# Patient Record
Sex: Female | Born: 1940 | Race: White | Hispanic: No | Marital: Married | Smoking: Former smoker
Health system: Southern US, Community
[De-identification: ages and names within clinical notes are randomized; demographics above are authoritative.]

## PROBLEM LIST (undated history)

## (undated) DIAGNOSIS — I10 Essential (primary) hypertension: Secondary | ICD-10-CM

## (undated) DIAGNOSIS — J84112 Idiopathic pulmonary fibrosis: Secondary | ICD-10-CM

## (undated) DIAGNOSIS — E871 Hypo-osmolality and hyponatremia: Secondary | ICD-10-CM

## (undated) DIAGNOSIS — E785 Hyperlipidemia, unspecified: Secondary | ICD-10-CM

## (undated) DIAGNOSIS — M1990 Unspecified osteoarthritis, unspecified site: Secondary | ICD-10-CM

## (undated) DIAGNOSIS — R011 Cardiac murmur, unspecified: Secondary | ICD-10-CM

## (undated) HISTORY — DX: Cardiac murmur, unspecified: R01.1

## (undated) HISTORY — DX: Hypo-osmolality and hyponatremia: E87.1

## (undated) HISTORY — DX: Unspecified osteoarthritis, unspecified site: M19.90

## (undated) HISTORY — DX: Hyperlipidemia, unspecified: E78.5

---

## 2011-08-10 DIAGNOSIS — E7800 Pure hypercholesterolemia, unspecified: Secondary | ICD-10-CM | POA: Insufficient documentation

## 2011-08-10 DIAGNOSIS — R0600 Dyspnea, unspecified: Secondary | ICD-10-CM | POA: Insufficient documentation

## 2014-07-18 DIAGNOSIS — I10 Essential (primary) hypertension: Secondary | ICD-10-CM | POA: Insufficient documentation

## 2014-07-18 DIAGNOSIS — I471 Supraventricular tachycardia: Secondary | ICD-10-CM | POA: Insufficient documentation

## 2014-07-18 DIAGNOSIS — I4719 Other supraventricular tachycardia: Secondary | ICD-10-CM | POA: Insufficient documentation

## 2014-07-18 DIAGNOSIS — N281 Cyst of kidney, acquired: Secondary | ICD-10-CM | POA: Insufficient documentation

## 2016-04-18 DIAGNOSIS — R7303 Prediabetes: Secondary | ICD-10-CM | POA: Insufficient documentation

## 2017-10-21 DIAGNOSIS — Z947 Corneal transplant status: Secondary | ICD-10-CM | POA: Insufficient documentation

## 2017-10-21 DIAGNOSIS — H18519 Endothelial corneal dystrophy, unspecified eye: Secondary | ICD-10-CM | POA: Insufficient documentation

## 2017-10-21 DIAGNOSIS — Z961 Presence of intraocular lens: Secondary | ICD-10-CM | POA: Insufficient documentation

## 2017-10-21 DIAGNOSIS — H353132 Nonexudative age-related macular degeneration, bilateral, intermediate dry stage: Secondary | ICD-10-CM | POA: Insufficient documentation

## 2017-10-21 DIAGNOSIS — H43812 Vitreous degeneration, left eye: Secondary | ICD-10-CM | POA: Insufficient documentation

## 2018-03-31 ENCOUNTER — Other Ambulatory Visit: Payer: Self-pay | Admitting: Family Medicine

## 2018-03-31 DIAGNOSIS — R9389 Abnormal findings on diagnostic imaging of other specified body structures: Secondary | ICD-10-CM

## 2018-04-05 ENCOUNTER — Other Ambulatory Visit: Payer: Self-pay

## 2018-04-11 ENCOUNTER — Ambulatory Visit
Admission: RE | Admit: 2018-04-11 | Discharge: 2018-04-11 | Disposition: A | Discharge status: Home / Self Care | Payer: Medicare Other | Source: Ambulatory Visit | Attending: Family Medicine | Admitting: Family Medicine

## 2018-04-11 ENCOUNTER — Other Ambulatory Visit: Payer: Self-pay

## 2018-04-11 DIAGNOSIS — R9389 Abnormal findings on diagnostic imaging of other specified body structures: Secondary | ICD-10-CM | POA: Diagnosis present

## 2019-02-26 DIAGNOSIS — J84112 Idiopathic pulmonary fibrosis: Secondary | ICD-10-CM | POA: Insufficient documentation

## 2019-09-04 DIAGNOSIS — R011 Cardiac murmur, unspecified: Secondary | ICD-10-CM | POA: Insufficient documentation

## 2020-05-23 ENCOUNTER — Other Ambulatory Visit: Payer: Self-pay

## 2020-05-23 DIAGNOSIS — Z87891 Personal history of nicotine dependence: Secondary | ICD-10-CM | POA: Insufficient documentation

## 2020-05-23 DIAGNOSIS — I10 Essential (primary) hypertension: Secondary | ICD-10-CM | POA: Insufficient documentation

## 2020-05-23 DIAGNOSIS — H1132 Conjunctival hemorrhage, left eye: Secondary | ICD-10-CM | POA: Insufficient documentation

## 2020-05-23 DIAGNOSIS — H5712 Ocular pain, left eye: Secondary | ICD-10-CM | POA: Diagnosis present

## 2020-05-23 NOTE — ED Triage Notes (Signed)
Pt states she her eye started to hurt x1hour, she looked in the mirror "and that's what it was." Frank redness and swelling of the conjunctiva noted. Pt denies any visual disturbance. PERRLA noted. Pupils 2+ bilaterally. Pt denies known injury

## 2020-05-24 ENCOUNTER — Emergency Department
Admission: EM | Admit: 2020-05-24 | Discharge: 2020-05-24 | Disposition: A | Discharge status: Home / Self Care | Payer: Medicare Other | Attending: Emergency Medicine | Admitting: Emergency Medicine

## 2020-05-24 DIAGNOSIS — H1132 Conjunctival hemorrhage, left eye: Secondary | ICD-10-CM

## 2020-05-24 HISTORY — DX: Essential (primary) hypertension: I10

## 2020-05-24 MED ORDER — ARTIFICIAL TEARS OPHTHALMIC OINT
1.0000 "application " | TOPICAL_OINTMENT | OPHTHALMIC | Status: DC | PRN
  Administered 2020-05-24: 1 via OPHTHALMIC
  Filled 2020-05-24 (×2): qty 1

## 2020-05-24 NOTE — ED Provider Notes (Signed)
[facility] Emergency Department Provider Note  ____________________________________________  Time seen: Approximately 4:14 AM  I have reviewed the triage vital signs and the nursing notes.   HISTORY  Chief Complaint Eye Injury   HPI Susan Sosa is a 80 y.o. female with a history of hypertension, corneal transplant, cataract surgery who presents for evaluation of left eye swelling and pain.  Patient reports that she was watching TV when she started having mild pain to the left eye.  She looked in the mirror and saw swelling and redness of the eye which prompted visit to the emergency room.  She denies blurry vision or double vision.  She denies headache.  She denies trauma.  She reports that her pain is minimal.  She takes aspirin but no other blood thinners.    Past Medical History:  Diagnosis Date  . Hypertension   Cataract surgery Corneal transplant  Allergies Patient has no allergy information on record.  History reviewed. No pertinent family history.  Social History Social History   Tobacco Use  . Smoking status: Former Games developer  . Smokeless tobacco: Never Used    Review of Systems  Constitutional: Negative for fever. Eyes: Negative for visual changes. + L eye pain and swelling ENT: Negative for sore throat. Neck: No neck pain  Cardiovascular: Negative for chest pain. Respiratory: Negative for shortness of breath. Gastrointestinal: Negative for abdominal pain, vomiting or diarrhea. Genitourinary: Negative for dysuria. Musculoskeletal: Negative for back pain. Skin: Negative for rash. Neurological: Negative for headaches, weakness or numbness. Psych: No SI or HI  ____________________________________________   PHYSICAL EXAM:  VITAL SIGNS: ED Triage Vitals  Enc Vitals Group     BP 05/23/20 2137 (!) 194/79     Pulse Rate 05/23/20 2137 75     Resp 05/23/20 2137 18     Temp 05/23/20 2137 98.4 F (36.9 C)     Temp Source 05/23/20  2137 Oral     SpO2 05/23/20 2137 98 %     Weight 05/23/20 2138 136 lb (61.7 kg)     Height 05/23/20 2138 4\' 11"  (1.499 m)     Head Circumference --      Peak Flow --      Pain Score 05/23/20 2138 0     Pain Loc --      Pain Edu? --      Excl. in GC? --     Constitutional: Alert and oriented. Well appearing and in no apparent distress. HEENT:      Head: Normocephalic and atraumatic.         Eyes: Pretty severe subconjunctival hemorrhage with chemosis, pupil is reactive, normal consensual reflex, visual fields are intact, extraocular movements are intact, intraocular pressure of 19, visual acuity of 20/40 on bilateral eyes with reading glasses.      Mouth/Throat: Mucous membranes are moist.       Neck: Supple with no signs of meningismus. Cardiovascular: Regular rate and rhythm. Respiratory: Normal respiratory effort.  Musculoskeletal:  No edema, cyanosis, or erythema of extremities. Neurologic: Normal speech and language. Face is symmetric. Moving all extremities. No gross focal neurologic deficits are appreciated. Skin: Skin is warm, dry and intact. No rash noted. Psychiatric: Mood and affect are normal. Speech and behavior are normal.  ____________________________________________   LABS (all labs ordered are listed, but only abnormal results are displayed)  Labs Reviewed - No data to display ____________________________________________  EKG  none  ____________________________________________  RADIOLOGY  none  ____________________________________________  PROCEDURES  Procedure(s) performed: None Procedures Critical Care performed:  None ____________________________________________   INITIAL IMPRESSION / ASSESSMENT AND PLAN / ED COURSE  80 y.o. female with a history of hypertension, corneal transplant, cataract surgery who presents for evaluation of left eye swelling and pain.  Exam as listed above.  Discussed with Dr. Druscilla Brownie the results of my exam and send him  a picture through MyChart of patient's eye.  He agrees with the diagnosis subconjunctival hemorrhage.  Recommend artificial tears every hour while patient is awake and he will see her in the office at 10 AM.  Patient was given artificial tears here.  Discussed his recommendations.  He recommended more emergent evaluation in the ED if she has severe pain or if she develops blurry/double vision.  Discussed these recommendations with patient and her husband who was at bedside.       _____________________________________________ Please note:  Patient was evaluated in Emergency Department today for the symptoms described in the history of present illness. Patient was evaluated in the context of the global COVID-19 pandemic, which necessitated consideration that the patient might be at risk for infection with the SARS-CoV-2 virus that causes COVID-19. Institutional protocols and algorithms that pertain to the evaluation of patients at risk for COVID-19 are in a state of rapid change based on information released by regulatory bodies including the CDC and federal and state organizations. These policies and algorithms were followed during the patient's care in the ED.  Some ED evaluations and interventions may be delayed as a result of limited staffing during the pandemic.   [place] Controlled Substance Database was reviewed by me. ____________________________________________   FINAL CLINICAL IMPRESSION(S) / ED DIAGNOSES   Final diagnoses:  Subconjunctival hemorrhage of left eye      NEW MEDICATIONS STARTED DURING THIS VISIT:  ED Discharge Orders    None       Note:  This document was prepared using Dragon voice recognition software and may include unintentional dictation errors.    Nita Sickle, MD 05/24/20 [id number]

## 2020-05-24 NOTE — Discharge Instructions (Signed)
Apply the ointment given to you every hour to the left eye while you are awake.  At 10 AM [facility] to be seen by Dr. Druscilla Brownie.  The front office door will be closed.  Make sure to go to the right-hand side door in the building.  The door will be open and he will be waiting for you.  If you are unable to find the location or to be seen by him please make sure to return to the ER so he can see you here.  If you have changes in vision please return to the ER for more emergent evaluation.

## 2020-08-04 ENCOUNTER — Other Ambulatory Visit: Payer: Self-pay

## 2020-08-04 ENCOUNTER — Encounter: Payer: Medicare Other | Attending: Pulmonary Disease | Admitting: *Deleted

## 2020-08-04 DIAGNOSIS — M5137 Other intervertebral disc degeneration, lumbosacral region: Secondary | ICD-10-CM | POA: Insufficient documentation

## 2020-08-04 DIAGNOSIS — M1710 Unilateral primary osteoarthritis, unspecified knee: Secondary | ICD-10-CM | POA: Insufficient documentation

## 2020-08-04 DIAGNOSIS — M75120 Complete rotator cuff tear or rupture of unspecified shoulder, not specified as traumatic: Secondary | ICD-10-CM | POA: Insufficient documentation

## 2020-08-04 DIAGNOSIS — J8410 Pulmonary fibrosis, unspecified: Secondary | ICD-10-CM | POA: Insufficient documentation

## 2020-08-04 DIAGNOSIS — M179 Osteoarthritis of knee, unspecified: Secondary | ICD-10-CM | POA: Insufficient documentation

## 2020-08-04 NOTE — Progress Notes (Signed)
Initial telephone orientation completed. [facility]angosis can be found in [facility] 6/24. EP orientation scheduled for Tuesday 7/19 at 10am.

## 2020-08-12 ENCOUNTER — Other Ambulatory Visit: Payer: Self-pay

## 2020-08-12 VITALS — Ht 60.0 in | Wt 145.7 lb

## 2020-08-12 DIAGNOSIS — J8410 Pulmonary fibrosis, unspecified: Secondary | ICD-10-CM

## 2020-08-12 NOTE — Progress Notes (Signed)
Pulmonary Individual Treatment Plan  Patient Details  Name: Susan Sosa MRN: [id number] Date of Birth: 1940-08-19 Referring Provider:   Flowsheet Row Pulmonary Rehab from 08/12/2020 in [facility] Cardiac and Pulmonary Rehab  Referring Provider Dr. Vida Rigger MD       Initial Encounter Date:  Flowsheet Row Pulmonary Rehab from 08/12/2020 in [facility] Cardiac and Pulmonary Rehab  Date 08/12/20       Visit Diagnosis: Pulmonary fibrosis (HCC)  Patient's Home Medications on Admission:  Current Outpatient Medications:    albuterol (PROVENTIL) (2.5 MG/3ML) 0.083% nebulizer solution, Inhale into the lungs., Disp: , Rfl:    ascorbic acid (VITAMIN C) 500 MG tablet, ascorbic acid (vitamin C) 500 mg tablet   1 by oral route., Disp: , Rfl:    aspirin 81 MG chewable tablet, aspirin 81 mg chewable tablet   81 mg by oral route., Disp: , Rfl:    atorvastatin (LIPITOR) 40 MG tablet, Take 1 tablet by mouth daily., Disp: , Rfl:    calcium-vitamin D (OSCAL WITH D) 500-200 MG-UNIT TABS tablet, Take 1 tablet by mouth daily., Disp: , Rfl:    cyanocobalamin 1000 MCG tablet, cyanocobalamin (vit B-12) 1,000 mcg tablet   1000 ugs by oral route., Disp: , Rfl:    esomeprazole (NEXIUM) 40 MG capsule, Take 1 capsule by mouth at bedtime., Disp: , Rfl:    hydrALAZINE (APRESOLINE) 10 MG tablet, TAKE 1 TABLET BY MOUTH NIGHTLY AS NEEDED, Disp: , Rfl:    levalbuterol (XOPENEX) 1.25 MG/3ML nebulizer solution, Inhale into the lungs., Disp: , Rfl:    losartan (COZAAR) 50 MG tablet, 1 tab by mouth daily around 5pm, Disp: , Rfl:    magnesium oxide (MAG-OX) 400 MG tablet, magnesium oxide 400 mg (241.3 mg magnesium) tablet   400 mg twice a day by oral route., Disp: , Rfl:    multivitamin-iron-minerals-folic acid (CENTRUM) chewable tablet, Chew by mouth., Disp: , Rfl:    naproxen sodium (ALEVE) 220 MG tablet, naproxen sodium 220 mg tablet   440 mg twice a day by oral route., Disp: , Rfl:    Pirfenidone (ESBRIET) 267 MG CAPS, Take by  mouth., Disp: , Rfl:    prednisoLONE acetate (PRED FORTE) 1 % ophthalmic suspension, prednisolone acetate 1 % eye drops,suspension  INSTILL 1 DROP INTO LEFT EYE 3 TIMES A DAY, Disp: , Rfl:    triamcinolone cream (KENALOG) 0.1 %, APPLY TO AFFECTED AREA TWICE A DAY, Disp: , Rfl:    triamterene-hydrochlorothiazide (MAXZIDE) 75-50 MG tablet, TAKE 1 BY MOUTH EVERY DAY., Disp: , Rfl:    verapamil (CALAN-SR) 180 MG CR tablet, Take by mouth., Disp: , Rfl:    vitamin E 180 MG (400 UNITS) capsule, vitamin E 268 mg (400 unit) capsule   400 units by oral route., Disp: , Rfl:   Past Medical History: Past Medical History:  Diagnosis Date   Hypertension     Tobacco Use: Social History   Tobacco Use  Smoking Status Former  Smokeless Tobacco Never    Labs: Recent Review Flowsheet Data   There is no flowsheet data to display.      Pulmonary Assessment Scores:  Pulmonary Assessment Scores     Row Name 08/12/20 1440         ADL UCSD   ADL Phase Entry     SOB Score total 23     Rest 0     Walk 1     Stairs 3     Bath 1  Dress 1     Shop 1           CAT Score     CAT Score 11           mMRC Score     mMRC Score 1             UCSD: Self-administered rating of dyspnea associated with activities of daily living (ADLs) 6-point scale (0 = "not at all" to 5 = "maximal or unable to do because of breathlessness")  Scoring Scores range from 0 to 120.  Minimally important difference is 5 units  CAT: CAT can identify the health impairment of COPD patients and is better correlated with disease progression.  CAT has a scoring range of zero to 40. The CAT score is classified into four groups of low (less than 10), medium (10 - 20), high (21-30) and very high (31-40) based on the impact level of disease on health status. A CAT score over 10 suggests significant symptoms.  A worsening CAT score could be explained by an exacerbation, poor medication adherence, poor inhaler technique, or  progression of COPD or comorbid conditions.  CAT MCID is 2 points  mMRC: mMRC (Modified Medical Research Council) Dyspnea Scale is used to assess the degree of baseline functional disability in patients of respiratory disease due to dyspnea. No minimal important difference is established. A decrease in score of 1 point or greater is considered a positive change.   Pulmonary Function Assessment:   Exercise Target Goals: Exercise Program Goal: Individual exercise prescription set using results from initial 6 min walk test and THRR while considering  patient's activity barriers and safety.   Exercise Prescription Goal: Initial exercise prescription builds to 30-45 minutes a day of aerobic activity, 2-3 days per week.  Home exercise guidelines will be given to patient during program as part of exercise prescription that the participant will acknowledge.  Education: Aerobic Exercise: - Group verbal and visual presentation on the components of exercise prescription. Introduces F.I.T.T principle from ACSM for exercise prescriptions.  Reviews F.I.T.T. principles of aerobic exercise including progression. Written material given at graduation.   Education: Resistance Exercise: - Group verbal and visual presentation on the components of exercise prescription. Introduces F.I.T.T principle from ACSM for exercise prescriptions  Reviews F.I.T.T. principles of resistance exercise including progression. Written material given at graduation.    Education: Exercise & Equipment Safety: - Individual verbal instruction and demonstration of equipment use and safety with use of the equipment. Flowsheet Row Pulmonary Rehab from 08/12/2020 in [facility] Cardiac and Pulmonary Rehab  Education need identified 08/12/20  Date 08/12/20  Educator KL  Instruction Review Code 1- Verbalizes Understanding       Education: Exercise Physiology & General Exercise Guidelines: - Group verbal and written instruction with models  to review the exercise physiology of the cardiovascular system and associated critical values. Provides general exercise guidelines with specific guidelines to those with heart or lung disease.    Education: Flexibility, Balance, Mind/Body Relaxation: - Group verbal and visual presentation with interactive activity on the components of exercise prescription. Introduces F.I.T.T principle from ACSM for exercise prescriptions. Reviews F.I.T.T. principles of flexibility and balance exercise training including progression. Also discusses the mind body connection.  Reviews various relaxation techniques to help reduce and manage stress (i.e. Deep breathing, progressive muscle relaxation, and visualization). Balance handout provided to take home. Written material given at graduation.   Activity Barriers & Risk Stratification:  Activity Barriers & Cardiac Risk Stratification -  08/12/20 1158       Activity Barriers & Cardiac Risk Stratification   Activity Barriers Shortness of Breath;Joint Problems;Deconditioning;Back Problems;Muscular Weakness;Other (comment)    Comments Right knee- gets cortisone shots             6 Minute Walk:  6 Minute Walk     Row Name 08/12/20 1158         6 Minute Walk   Phase Initial     Distance 500 feet     Walk Time 3.5 minutes  Stopped at 3:30 due to O2 sats     # of Rest Breaks 0     MPH 1.62     METS 1.08     RPE 12     Perceived Dyspnea  2     VO2 Peak 3.78     Symptoms No     Resting HR 67 bpm     Resting BP 130/62     Resting Oxygen Saturation  97 %     Exercise Oxygen Saturation  during 6 min walk 79 %     Max Ex. HR 111 bpm     Max Ex. BP 162/68     2 Minute Post BP 134/68           Interval HR     1 Minute HR 87     2 Minute HR 101     3 Minute HR 111  Stopped at 3:30 due to O2 < 80%     Interval Heart Rate? Yes           Interval Oxygen     Interval Oxygen? Yes     Baseline Oxygen Saturation % 97 %     1 Minute Oxygen Saturation %  93 %     1 Minute Liters of Oxygen 2 L     2 Minute Oxygen Saturation % 87 %     2 Minute Liters of Oxygen 2 L     3 Minute Oxygen Saturation % 79 %     3 Minute Liters of Oxygen 2 L     4 Minute Oxygen Saturation % --  stopped     5 Minute Oxygen Saturation % --  stopped     6 Minute Oxygen Saturation % --  stopped     2 Minute Post Oxygen Saturation % 97 %     2 Minute Post Liters of Oxygen 3 L            Oxygen Initial Assessment:  Oxygen Initial Assessment - 08/12/20 1438       Home Oxygen   Home Oxygen Device Home Concentrator;Portable Concentrator    Sleep Oxygen Prescription Continuous    Liters per minute 2    Home Exercise Oxygen Prescription Continuous    Liters per minute 2    Home Resting Oxygen Prescription Continuous   Portable- pulsed   Liters per minute 2    Compliance with Home Oxygen Use Yes      Initial 6 min Walk   Oxygen Used Continuous    Liters per minute 2      Program Oxygen Prescription   Program Oxygen Prescription Continuous    Liters per minute 2      Intervention   Short Term Goals To learn and exhibit compliance with exercise, home and travel O2 prescription;To learn and understand importance of monitoring SPO2 with pulse oximeter and demonstrate accurate use of the pulse oximeter.;To learn and understand  importance of maintaining oxygen saturations>88%;To learn and demonstrate proper pursed lip breathing techniques or other breathing techniques. ;To learn and demonstrate proper use of respiratory medications    Long  Term Goals Exhibits compliance with exercise, home  and travel O2 prescription;Verbalizes importance of monitoring SPO2 with pulse oximeter and return demonstration;Maintenance of O2 saturations>88%;Exhibits proper breathing techniques, such as pursed lip breathing or other method taught during program session;Compliance with respiratory medication;Demonstrates proper use of MDI's             Oxygen  Re-Evaluation:   Oxygen Discharge (Final Oxygen Re-Evaluation):   Initial Exercise Prescription:  Initial Exercise Prescription - 08/12/20 1500       Date of Initial Exercise RX and Referring Provider   Date 08/12/20    Referring Provider Dr. Karna Christmas, Luis Abed MD      Treadmill   MPH 1.5    Grade 0    Minutes 15    METs 2.15      Recumbant Bike   Level 1    RPM 60    Watts 10    Minutes 15    METs 1.08      NuStep   Level 1    SPM 80    Minutes 15    METs 1.08      T5 Nustep   Level 1    SPM 80    Minutes 15    METs 1.08      Prescription Details   Frequency (times per week) 2    Duration Progress to 30 minutes of continuous aerobic without signs/symptoms of physical distress      Intensity   THRR 40-80% of Max Heartrate 95-124    Ratings of Perceived Exertion 11-13    Perceived Dyspnea 0-4      Progression   Progression Continue to progress workloads to maintain intensity without signs/symptoms of physical distress.      Resistance Training   Training Prescription Yes    Weight 3 lb    Reps 10-15             Perform Capillary Blood Glucose checks as needed.  Exercise Prescription Changes:   Exercise Prescription Changes     Row Name 08/12/20 1500             Response to Exercise   Blood Pressure (Admit) 130/62       Blood Pressure (Exercise) 162/68       Blood Pressure (Exit) 134/66       Heart Rate (Admit) 67 bpm       Heart Rate (Exercise) 111 bpm       Heart Rate (Exit) 72 bpm       Oxygen Saturation (Admit) 97 %       Oxygen Saturation (Exercise) 79 %       Oxygen Saturation (Exit) 97 %       Rating of Perceived Exertion (Exercise) 12       Perceived Dyspnea (Exercise) 2       Symptoms none       Comments walk test results                Exercise Comments:   Exercise Goals and Review:   Exercise Goals     Row Name 08/12/20 1509             Exercise Goals   Increase Physical Activity Yes       Intervention  Provide advice, education, support and counseling  about physical activity/exercise needs.;Develop an individualized exercise prescription for aerobic and resistive training based on initial evaluation findings, risk stratification, comorbidities and participant's personal goals.       Expected Outcomes Long Term: Add in home exercise to make exercise part of routine and to increase amount of physical activity.;Short Term: Attend rehab on a regular basis to increase amount of physical activity.;Long Term: Exercising regularly at least 3-5 days a week.       Increase Strength and Stamina Yes       Intervention Provide advice, education, support and counseling about physical activity/exercise needs.;Develop an individualized exercise prescription for aerobic and resistive training based on initial evaluation findings, risk stratification, comorbidities and participant's personal goals.       Expected Outcomes Short Term: Increase workloads from initial exercise prescription for resistance, speed, and METs.;Short Term: Perform resistance training exercises routinely during rehab and add in resistance training at home;Long Term: Improve cardiorespiratory fitness, muscular endurance and strength as measured by increased METs and functional capacity ( )       Able to understand and use rate of perceived exertion (RPE) scale Yes       Intervention Provide education and explanation on how to use RPE scale       Expected Outcomes Short Term: Able to use RPE daily in rehab to express subjective intensity level;Long Term:  Able to use RPE to guide intensity level when exercising independently       Able to understand and use Dyspnea scale Yes       Intervention Provide education and explanation on how to use Dyspnea scale       Expected Outcomes Short Term: Able to use Dyspnea scale daily in rehab to express subjective sense of shortness of breath during exertion;Long Term: Able to use Dyspnea scale to guide  intensity level when exercising independently       Knowledge and understanding of Target Heart Rate Range (THRR) Yes       Intervention Provide education and explanation of THRR including how the numbers were predicted and where they are located for reference       Expected Outcomes Short Term: Able to state/look up THRR;Long Term: Able to use THRR to govern intensity when exercising independently;Short Term: Able to use daily as guideline for intensity in rehab       Able to check pulse independently Yes       Intervention Provide education and demonstration on how to check pulse in carotid and radial arteries.;Review the importance of being able to check your own pulse for safety during independent exercise       Expected Outcomes Short Term: Able to explain why pulse checking is important during independent exercise;Long Term: Able to check pulse independently and accurately       Understanding of Exercise Prescription Yes       Intervention Provide education, explanation, and written materials on patient's individual exercise prescription       Expected Outcomes Short Term: Able to explain program exercise prescription;Long Term: Able to explain home exercise prescription to exercise independently                Exercise Goals Re-Evaluation :   Discharge Exercise Prescription (Final Exercise Prescription Changes):  Exercise Prescription Changes - 08/12/20 1500       Response to Exercise   Blood Pressure (Admit) 130/62    Blood Pressure (Exercise) 162/68    Blood Pressure (Exit) 134/66    Heart Rate (Admit)  67 bpm    Heart Rate (Exercise) 111 bpm    Heart Rate (Exit) 72 bpm    Oxygen Saturation (Admit) 97 %    Oxygen Saturation (Exercise) 79 %    Oxygen Saturation (Exit) 97 %    Rating of Perceived Exertion (Exercise) 12    Perceived Dyspnea (Exercise) 2    Symptoms none    Comments walk test results             Nutrition:  Target Goals: Understanding of nutrition  guidelines, daily intake of sodium 1500mg , cholesterol 200mg , calories 30% from fat and 7% or less from saturated fats, daily to have 5 or more servings of fruits and vegetables.  Education: All About Nutrition: -Group instruction provided by verbal, written material, interactive activities, discussions, models, and posters to present general guidelines for heart healthy nutrition including fat, fiber, MyPlate, the role of sodium in heart healthy nutrition, utilization of the nutrition label, and utilization of this knowledge for meal planning. Follow up email sent as well. Written material given at graduation.   Biometrics:  Pre Biometrics - 08/12/20 1157       Pre Biometrics   Height 5' (1.524 m)    Weight 145 lb 11.2 oz (66.1 kg)    BMI (Calculated) 28.46    Single Leg Stand 10.03 seconds              Nutrition Therapy Plan and Nutrition Goals:   Nutrition Assessments:  MEDIFICTS Score Key: ?70 Need to make dietary changes  40-70 Heart Healthy Diet ? 40 Therapeutic Level Cholesterol Diet  Flowsheet Row Pulmonary Rehab from 08/12/2020 in [facility] Cardiac and Pulmonary Rehab  Picture Your Plate Total Score on Admission 66      Picture Your Plate Scores: <46 Unhealthy dietary pattern with much room for improvement. 41-50 Dietary pattern unlikely to meet recommendations for good health and room for improvement. 51-60 More healthful dietary pattern, with some room for improvement.  >60 Healthy dietary pattern, although there may be some specific behaviors that could be improved.   Nutrition Goals Re-Evaluation:   Nutrition Goals Discharge (Final Nutrition Goals Re-Evaluation):   Psychosocial: Target Goals: Acknowledge presence or absence of significant depression and/or stress, maximize coping skills, provide positive support system. Participant is able to verbalize types and ability to use techniques and skills needed for reducing stress and depression.   Education:  Stress, Anxiety, and Depression - Group verbal and visual presentation to define topics covered.  Reviews how body is impacted by stress, anxiety, and depression.  Also discusses healthy ways to reduce stress and to treat/manage anxiety and depression.  Written material given at graduation.   Education: Sleep Hygiene -Provides group verbal and written instruction about how sleep can affect your health.  Define sleep hygiene, discuss sleep cycles and impact of sleep habits. Review good sleep hygiene tips.    Initial Review & Psychosocial Screening:  Initial Psych Review & Screening - 08/04/20 1545       Initial Review   Current issues with Current Sleep Concerns      Family Dynamics   Good Support System? Yes   husband     Barriers   Psychosocial barriers to participate in program There are no identifiable barriers or psychosocial needs.;The patient should benefit from training in stress management and relaxation.      Screening Interventions   Interventions Encouraged to exercise;Provide feedback about the scores to participant;To provide support and resources with identified psychosocial needs  Expected Outcomes Short Term goal: Utilizing psychosocial counselor, staff and physician to assist with identification of specific Stressors or current issues interfering with healing process. Setting desired goal for each stressor or current issue identified.;Long Term Goal: Stressors or current issues are controlled or eliminated.;Short Term goal: Identification and review with participant of any Quality of Life or Depression concerns found by scoring the questionnaire.;Long Term goal: The participant improves quality of Life and PHQ9 Scores as seen by post scores and/or verbalization of changes             Quality of Life Scores:  Scores of 19 and below usually indicate a poorer quality of life in these areas.  A difference of  2-3 points is a clinically meaningful difference.  A  difference of 2-3 points in the total score of the Quality of Life Index has been associated with significant improvement in overall quality of life, self-image, physical symptoms, and general health in studies assessing change in quality of life.  PHQ-9: Recent Review Flowsheet Data     Depression screen [facility] 2/9 08/12/2020   Decreased Interest 0   Down, Depressed, Hopeless 0   PHQ - 2 Score 0   Altered sleeping 0   Tired, decreased energy 1   Change in appetite 0   Feeling bad or failure about yourself  0   Trouble concentrating 0   Moving slowly or fidgety/restless 0   Suicidal thoughts 0   PHQ-9 Score 1   Difficult doing work/chores Not difficult at all      Interpretation of Total Score  Total Score Depression Severity:  1-4 = Minimal depression, 5-9 = Mild depression, 10-14 = Moderate depression, 15-19 = Moderately severe depression, 20-27 = Severe depression   Psychosocial Evaluation and Intervention:  Psychosocial Evaluation - 08/04/20 1546       Psychosocial Evaluation & Interventions   Interventions Encouraged to exercise with the program and follow exercise prescription    Comments Susan 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 She has been dealing with this diagnosis for the past two years, but is proud that the doctor said she seems to be holding her own. She reports that she already has lost 30% of her lung capacity and is working hard to maintain her current lung function which is why she is motivated to come to rehab. She walks [facility]-3 times a [facility] and is diligent about her breathing exercises and medications. She wears oxygen as well. Her husband is her main support system. She does not report any stressors currently as she feels like she is handling her current health status appropriately. She has noticed in the last 6 months that her sleep has been altered, where she used to fall asleep quickly now it is taking her over an hour to fall  back asleep after waking to use the bathroom.    Expected Outcomes Short: attend pulmonary rehab for education and exercise. Long: develop and maintain positive self care habits.    Continue Psychosocial Services  Follow up required by staff             Psychosocial Re-Evaluation:   Psychosocial Discharge (Final Psychosocial Re-Evaluation):   Education: Education Goals: Education classes will be provided on a weekly basis, covering required topics. Participant will state understanding/return demonstration of topics presented.  Learning Barriers/Preferences:   General Pulmonary Education Topics:  Infection Prevention: - Provides verbal and written material to individual with discussion of infection control including  proper hand washing and proper equipment cleaning during exercise session. Flowsheet Row Pulmonary Rehab from 08/12/2020 in [facility] Cardiac and Pulmonary Rehab  Education need identified 08/12/20  Date 08/12/20  Educator KL  Instruction Review Code 1- Verbalizes Understanding       Falls Prevention: - Provides verbal and written material to individual with discussion of falls prevention and safety. Flowsheet Row Pulmonary Rehab from 08/12/2020 in [facility] Cardiac and Pulmonary Rehab  Education need identified 08/12/20  Date 08/12/20  Educator KL  Instruction Review Code 1- Verbalizes Understanding       Chronic Lung Disease Review: - Group verbal instruction with posters, models, PowerPoint presentations and videos,  to review new updates, new respiratory medications, new advancements in procedures and treatments. Providing information on websites and "800" numbers for continued self-education. Includes information about supplement oxygen, available portable oxygen systems, continuous and intermittent flow rates, oxygen safety, concentrators, and Medicare reimbursement for oxygen. Explanation of Pulmonary Drugs, including class, frequency, complications, importance of  spacers, rinsing mouth after steroid MDI's, and proper cleaning methods for nebulizers. Review of basic lung anatomy and physiology related to function, structure, and complications of lung disease. Review of risk factors. Discussion about methods for diagnosing sleep apnea and types of masks and machines for OSA. Includes a review of the use of types of environmental controls: home humidity, furnaces, filters, dust mite/pet prevention, HEPA vacuums. Discussion about weather changes, air quality and the benefits of nasal washing. Instruction on Warning signs, infection symptoms, calling MD promptly, preventive modes, and value of vaccinations. Review of effective airway clearance, coughing and/or vibration techniques. Emphasizing that all should Create an Action Plan. Written material given at graduation.   AED/CPR: - Group verbal and written instruction with the use of models to demonstrate the basic use of the AED with the basic ABC's of resuscitation.    Anatomy and Cardiac Procedures: - Group verbal and visual presentation and models provide information about basic cardiac anatomy and function. Reviews the testing methods done to diagnose heart disease and the outcomes of the test results. Describes the treatment choices: Medical Management, Angioplasty, or Coronary Bypass Surgery for treating various heart conditions including Myocardial Infarction, Angina, Valve Disease, and Cardiac Arrhythmias.  Written material given at graduation.   Medication Safety: - Group verbal and visual instruction to review commonly prescribed medications for heart and lung disease. Reviews the medication, class of the drug, and side effects. Includes the steps to properly store meds and maintain the prescription regimen.  Written material given at graduation.   Other: -Provides group and verbal instruction on various topics (see comments)   Knowledge Questionnaire Score:  Knowledge Questionnaire Score -  08/12/20 1443       Knowledge Questionnaire Score   Pre Score 15/18: Oxygen saturation, Albuterol, Oxygen flame              Core Components/Risk Factors/Patient Goals at Admission:  Personal Goals and Risk Factors at Admission - 08/12/20 1508       Core Components/Risk Factors/Patient Goals on Admission    Weight Management Yes;Weight Maintenance    Intervention Weight Management: Develop a combined nutrition and exercise program designed to reach desired caloric intake, while maintaining appropriate intake of nutrient and fiber, sodium and fats, and appropriate energy expenditure required for the weight goal.;Weight Management: Provide education and appropriate resources to help participant work on and attain dietary goals.;Weight Management/Obesity: Establish reasonable short term and long term weight goals.    Admit Weight 145 lb (65.8 kg)  Goal Weight: Short Term 145 lb (65.8 kg)    Goal Weight: Long Term 145 lb (65.8 kg)    Expected Outcomes Short Term: Continue to assess and modify interventions until short term weight is achieved;Weight Maintenance: Understanding of the daily nutrition guidelines, which includes 25-35% calories from fat, 7% or less cal from saturated fats, less than 200mg  cholesterol, less than 1.5gm of sodium, & 5 or more servings of fruits and vegetables daily;Understanding recommendations for meals to include 15-35% energy as protein, 25-35% energy from fat, 35-60% energy from carbohydrates, less than 200mg  of dietary cholesterol, 20-35 gm of total fiber daily;Understanding of distribution of calorie intake throughout the day with the consumption of 4-5 meals/snacks    Improve shortness of breath with ADL's Yes    Intervention Provide education, individualized exercise plan and daily activity instruction to help decrease symptoms of SOB with activities of daily living.    Expected Outcomes Short Term: Improve cardiorespiratory fitness to achieve a reduction of  symptoms when performing ADLs;Long Term: Be able to perform more ADLs without symptoms or delay the onset of symptoms    Hypertension Yes    Intervention Provide education on lifestyle modifcations including regular physical activity/exercise, weight management, moderate sodium restriction and increased consumption of fresh fruit, vegetables, and low fat dairy, alcohol moderation, and smoking cessation.;Monitor prescription use compliance.    Expected Outcomes Short Term: Continued assessment and intervention until BP is < 140/5390mm HG in hypertensive participants. < 130/7180mm HG in hypertensive participants with diabetes, heart failure or chronic kidney disease.;Long Term: Maintenance of blood pressure at goal levels.    Lipids Yes    Intervention Provide education and support for participant on nutrition & aerobic/resistive exercise along with prescribed medications to achieve LDL 70mg , HDL >40mg .    Expected Outcomes Short Term: Participant states understanding of desired cholesterol values and is compliant with medications prescribed. Participant is following exercise prescription and nutrition guidelines.;Long Term: Cholesterol controlled with medications as prescribed, with individualized exercise RX and with personalized nutrition plan. Value goals: LDL < 70mg , HDL > 40 mg.             Education:Diabetes - Individual verbal and written instruction to review signs/symptoms of diabetes, desired ranges of glucose level fasting, after meals and with exercise. Acknowledge that pre and post exercise glucose checks will be done for 3 sessions at entry of program.   Know Your Numbers and Heart Failure: - Group verbal and visual instruction to discuss disease risk factors for cardiac and pulmonary disease and treatment options.  Reviews associated critical values for Overweight/Obesity, Hypertension, Cholesterol, and Diabetes.  Discusses basics of heart failure: signs/symptoms and treatments.   Introduces Heart Failure Zone chart for action plan for heart failure.  Written material given at graduation.   Core Components/Risk Factors/Patient Goals Review:    Core Components/Risk Factors/Patient Goals at Discharge (Final Review):    ITP Comments:  ITP Comments     Row Name 08/04/20 1557 08/12/20 1141         ITP Comments Initial telephone orientation completed. [facility] EP orientation scheduled for Tuesday 7/19 at 10am. Completed 6MWT and gym orientation. Initial ITP created and sent for review to Dr. Vida RiggerFuad Aleskerov, Medical Director.               Comments: Initial ITP

## 2020-08-12 NOTE — Patient Instructions (Addendum)
Patient Instructions  Patient Details  Name: Susan Sosa MRN: [id number] Date of Birth: 1941-01-05 Referring Provider:  Vida Rigger, MD  Below are your personal goals for exercise, nutrition, and risk factors. Our goal is to help you stay on track towards obtaining and maintaining these goals. We will be discussing your progress on these goals with you throughout the program.  Initial Exercise Prescription:  Initial Exercise Prescription - 08/12/20 1500       Date of Initial Exercise RX and Referring Provider   Date 08/12/20    Referring Provider Dr. Karna Christmas, Luis Abed MD      Treadmill   MPH 1.5    Grade 0    Minutes 15    METs 2.15      Recumbant Bike   Level 1    RPM 60    Watts 10    Minutes 15    METs 1.08      NuStep   Level 1    SPM 80    Minutes 15    METs 1.08      T5 Nustep   Level 1    SPM 80    Minutes 15    METs 1.08      Prescription Details   Frequency (times per week) 2    Duration Progress to 30 minutes of continuous aerobic without signs/symptoms of physical distress      Intensity   THRR 40-80% of Max Heartrate 95-124    Ratings of Perceived Exertion 11-13    Perceived Dyspnea 0-4      Progression   Progression Continue to progress workloads to maintain intensity without signs/symptoms of physical distress.      Resistance Training   Training Prescription Yes    Weight 3 lb    Reps 10-15             Exercise Goals: Frequency: Be able to perform aerobic exercise two to three times per week in program working toward 2-5 days per week of home exercise.  Intensity: Work with a perceived exertion of 11 (fairly light) - 15 (hard) while following your exercise prescription.  We will make changes to your prescription with you as you progress through the program.   Duration: Be able to do 30 to 45 minutes of continuous aerobic exercise in addition to a 5 minute warm-up and a 5 minute cool-down routine.   Nutrition Goals: Your personal  nutrition goals will be established when you do your nutrition analysis with the dietician.  The following are general nutrition guidelines to follow: Cholesterol < 200mg /day Sodium < 1500mg /day Fiber: Women over 50 yrs - 21 grams per day  Personal Goals:  Personal Goals and Risk Factors at Admission - 08/12/20 1508       Core Components/Risk Factors/Patient Goals on Admission    Weight Management Yes;Weight Maintenance    Intervention Weight Management: Develop a combined nutrition and exercise program designed to reach desired caloric intake, while maintaining appropriate intake of nutrient and fiber, sodium and fats, and appropriate energy expenditure required for the weight goal.;Weight Management: Provide education and appropriate resources to help participant work on and attain dietary goals.;Weight Management/Obesity: Establish reasonable short term and long term weight goals.    Admit Weight 145 lb (65.8 kg)    Goal Weight: Short Term 145 lb (65.8 kg)    Goal Weight: Long Term 145 lb (65.8 kg)    Expected Outcomes Short Term: Continue to assess and modify interventions until short term  weight is achieved;Weight Maintenance: Understanding of the daily nutrition guidelines, which includes 25-35% calories from fat, 7% or less cal from saturated fats, less than 200mg  cholesterol, less than 1.5gm of sodium, & 5 or more servings of fruits and vegetables daily;Understanding recommendations for meals to include 15-35% energy as protein, 25-35% energy from fat, 35-60% energy from carbohydrates, less than 200mg  of dietary cholesterol, 20-35 gm of total fiber daily;Understanding of distribution of calorie intake throughout the day with the consumption of 4-5 meals/snacks    Improve shortness of breath with ADL's Yes    Intervention Provide education, individualized exercise plan and daily activity instruction to help decrease symptoms of SOB with activities of daily living.    Expected Outcomes  Short Term: Improve cardiorespiratory fitness to achieve a reduction of symptoms when performing ADLs;Long Term: Be able to perform more ADLs without symptoms or delay the onset of symptoms    Hypertension Yes    Intervention Provide education on lifestyle modifcations including regular physical activity/exercise, weight management, moderate sodium restriction and increased consumption of fresh fruit, vegetables, and low fat dairy, alcohol moderation, and smoking cessation.;Monitor prescription use compliance.    Expected Outcomes Short Term: Continued assessment and intervention until BP is < 140/71mm HG in hypertensive participants. < 130/64mm HG in hypertensive participants with diabetes, heart failure or chronic kidney disease.;Long Term: Maintenance of blood pressure at goal levels.    Lipids Yes    Intervention Provide education and support for participant on nutrition & aerobic/resistive exercise along with prescribed medications to achieve LDL 70mg , HDL >40mg .    Expected Outcomes Short Term: Participant states understanding of desired cholesterol values and is compliant with medications prescribed. Participant is following exercise prescription and nutrition guidelines.;Long Term: Cholesterol controlled with medications as prescribed, with individualized exercise RX and with personalized nutrition plan. Value goals: LDL < 70mg , HDL > 40 mg.             Tobacco Use Initial Evaluation: Social History   Tobacco Use  Smoking Status Former  Smokeless Tobacco Never    Exercise Goals and Review:  Exercise Goals     Row Name 08/12/20 1509             Exercise Goals   Increase Physical Activity Yes       Intervention Provide advice, education, support and counseling about physical activity/exercise needs.;Develop an individualized exercise prescription for aerobic and resistive training based on initial evaluation findings, risk stratification, comorbidities and participant's  personal goals.       Expected Outcomes Long Term: Add in home exercise to make exercise part of routine and to increase amount of physical activity.;Short Term: Attend rehab on a regular basis to increase amount of physical activity.;Long Term: Exercising regularly at least 3-5 days a week.       Increase Strength and Stamina Yes       Intervention Provide advice, education, support and counseling about physical activity/exercise needs.;Develop an individualized exercise prescription for aerobic and resistive training based on initial evaluation findings, risk stratification, comorbidities and participant's personal goals.       Expected Outcomes Short Term: Increase workloads from initial exercise prescription for resistance, speed, and METs.;Short Term: Perform resistance training exercises routinely during rehab and add in resistance training at home;Long Term: Improve cardiorespiratory fitness, muscular endurance and strength as measured by increased METs and functional capacity ( )       Able to understand and use rate of perceived exertion (RPE) scale Yes  Intervention Provide education and explanation on how to use RPE scale       Expected Outcomes Short Term: Able to use RPE daily in rehab to express subjective intensity level;Long Term:  Able to use RPE to guide intensity level when exercising independently       Able to understand and use Dyspnea scale Yes       Intervention Provide education and explanation on how to use Dyspnea scale       Expected Outcomes Short Term: Able to use Dyspnea scale daily in rehab to express subjective sense of shortness of breath during exertion;Long Term: Able to use Dyspnea scale to guide intensity level when exercising independently       Knowledge and understanding of Target Heart Rate Range (THRR) Yes       Intervention Provide education and explanation of THRR including how the numbers were predicted and where they are located for reference        Expected Outcomes Short Term: Able to state/look up THRR;Long Term: Able to use THRR to govern intensity when exercising independently;Short Term: Able to use daily as guideline for intensity in rehab       Able to check pulse independently Yes       Intervention Provide education and demonstration on how to check pulse in carotid and radial arteries.;Review the importance of being able to check your own pulse for safety during independent exercise       Expected Outcomes Short Term: Able to explain why pulse checking is important during independent exercise;Long Term: Able to check pulse independently and accurately       Understanding of Exercise Prescription Yes       Intervention Provide education, explanation, and written materials on patient's individual exercise prescription       Expected Outcomes Short Term: Able to explain program exercise prescription;Long Term: Able to explain home exercise prescription to exercise independently                Copy of goals given to participant.

## 2020-08-14 ENCOUNTER — Other Ambulatory Visit: Payer: Self-pay

## 2020-08-14 ENCOUNTER — Encounter: Payer: Medicare Other | Admitting: *Deleted

## 2020-08-14 DIAGNOSIS — J8410 Pulmonary fibrosis, unspecified: Secondary | ICD-10-CM

## 2020-08-14 NOTE — Progress Notes (Signed)
Daily Session Note  Patient Details  Name: Susan Sosa MRN: [id number] Date of Birth: 16-Apr-1940 Referring Provider:   Flowsheet Row Pulmonary Rehab from 08/12/2020 in [facility] Cardiac and Pulmonary Rehab  Referring Provider Dr. Ottie Glazier MD       Encounter Date: 08/14/2020  Check In:  Session Check In - 08/14/20 0944       Check-In   Supervising physician immediately available to respond to emergencies See telemetry face sheet for immediately available ER MD    Location [facility]-Cardiac & Pulmonary Rehab    Staff Present Heath Lark, RN, BSN, Laveda Norman, BS, ACSM CEP, Exercise Physiologist;Amanda Oletta Darter, [place], ACSM CEP, Exercise Physiologist    Virtual Visit No    Medication changes reported     No    Fall or balance concerns reported    No    Warm-up and Cool-down Performed on first and last piece of equipment    Resistance Training Performed Yes    VAD Patient? No    PAD/SET Patient? No      Pain Assessment   Currently in Pain? No/denies                Social History   Tobacco Use  Smoking Status Former  Smokeless Tobacco Never    Goals Met:  Exercise tolerated well Personal goals reviewed No report of cardiac concerns or symptoms  Goals Unmet:  Not Applicable  Comments: First full day of exercise!  Patient was oriented to gym and equipment including functions, settings, policies, and procedures.  Patient's individual exercise prescription and treatment plan were reviewed.  All starting workloads were established based on the results of the 6 minute walk test done at initial orientation visit.  The plan for exercise progression was also introduced and progression will be customized based on patient's performance and goals.    Dr. Emily Filbert is Medical Director for [place].  Dr. Ottie Glazier is Medical Director for [facility] Pulmonary Rehabilitation.

## 2020-08-19 ENCOUNTER — Other Ambulatory Visit: Payer: Self-pay

## 2020-08-19 DIAGNOSIS — J8410 Pulmonary fibrosis, unspecified: Secondary | ICD-10-CM

## 2020-08-19 NOTE — Progress Notes (Signed)
Daily Session Note  Patient Details  Name: Susan Sosa MRN: [id number] Date of Birth: 1940-12-13 Referring Provider:   Flowsheet Row Pulmonary Rehab from 08/12/2020 in [facility] Cardiac and Pulmonary Rehab  Referring Provider Dr. Ottie Glazier MD       Encounter Date: 08/19/2020  Check In:  Session Check In - 08/19/20 0948       Check-In   Supervising physician immediately available to respond to emergencies See telemetry face sheet for immediately available ER MD    Location [facility]-Cardiac & Pulmonary Rehab    Staff Present Birdie Sons, MPA, RN;Jessica Luan Pulling, MA, RCEP, CCRP, CCET;Amanda Sommer, BA, ACSM CEP, Exercise Physiologist    Virtual Visit No    Medication changes reported     No    Fall or balance concerns reported    No    Warm-up and Cool-down Performed on first and last piece of equipment    Resistance Training Performed Yes    VAD Patient? No    PAD/SET Patient? No      Pain Assessment   Currently in Pain? No/denies                Social History   Tobacco Use  Smoking Status Former  Smokeless Tobacco Never    Goals Met:  Independence with exercise equipment Exercise tolerated well No report of cardiac concerns or symptoms Strength training completed today  Goals Unmet:  Not Applicable  Comments: Pt able to follow exercise prescription today without complaint.  Will continue to monitor for progression.    Dr. Emily Filbert is Medical Director for [place].  Dr. Ottie Glazier is Medical Director for [facility] Pulmonary Rehabilitation.

## 2020-08-21 ENCOUNTER — Other Ambulatory Visit: Payer: Self-pay

## 2020-08-21 DIAGNOSIS — J8410 Pulmonary fibrosis, unspecified: Secondary | ICD-10-CM

## 2020-08-21 NOTE — Progress Notes (Signed)
Daily Session Note  Patient Details  Name: Susan Sosa MRN: [id number] Date of Birth: 1940/03/15 Referring Provider:   Flowsheet Row Pulmonary Rehab from 08/12/2020 in [facility] Cardiac and Pulmonary Rehab  Referring Provider Dr. Ottie Glazier MD       Encounter Date: 08/21/2020  Check In:  Session Check In - 08/21/20 0943       Check-In   Supervising physician immediately available to respond to emergencies See telemetry face sheet for immediately available ER MD    Location [facility]-Cardiac & Pulmonary Rehab    Staff Present Birdie Sons, MPA, Elveria Rising, BA, ACSM CEP, Exercise Physiologist;Udell Mazzocco Amedeo Plenty, BS, ACSM CEP, Exercise Physiologist    Virtual Visit No    Medication changes reported     No    Fall or balance concerns reported    No    Warm-up and Cool-down Performed on first and last piece of equipment    Resistance Training Performed Yes    VAD Patient? No    PAD/SET Patient? No      Pain Assessment   Currently in Pain? No/denies                Social History   Tobacco Use  Smoking Status Former  Smokeless Tobacco Never    Goals Met:  Independence with exercise equipment Exercise tolerated well No report of cardiac concerns or symptoms Strength training completed today  Goals Unmet:  Not Applicable  Comments: Pt able to follow exercise prescription today without complaint.  Will continue to monitor for progression.    Dr. Emily Filbert is Medical Director for [place].  Dr. Ottie Glazier is Medical Director for [facility] Pulmonary Rehabilitation.

## 2020-08-26 ENCOUNTER — Encounter: Payer: Medicare Other | Attending: Pulmonary Disease

## 2020-08-26 ENCOUNTER — Other Ambulatory Visit: Payer: Self-pay

## 2020-08-26 DIAGNOSIS — J8410 Pulmonary fibrosis, unspecified: Secondary | ICD-10-CM

## 2020-08-26 NOTE — Progress Notes (Signed)
Daily Session Note  Patient Details  Name: Susan Sosa MRN: [id number] Date of Birth: 09/27/1940 Referring Provider:   Flowsheet Row Pulmonary Rehab from 08/12/2020 in [facility] Cardiac and Pulmonary Rehab  Referring Provider Dr. Ottie Glazier MD       Encounter Date: 08/26/2020  Check In:  Session Check In - 08/26/20 0943       Check-In   Supervising physician immediately available to respond to emergencies See telemetry face sheet for immediately available ER MD    Location [facility]-Cardiac & Pulmonary Rehab    Staff Present Birdie Sons, MPA, RN;Susanne Bice, RN, BSN, Jacklynn Bue, MS, ASCM CEP, Exercise Physiologist;Amanda Oletta Darter, BA, ACSM CEP, Exercise Physiologist    Virtual Visit No    Medication changes reported     No    Fall or balance concerns reported    No    Warm-up and Cool-down Performed on first and last piece of equipment    Resistance Training Performed Yes    VAD Patient? No    PAD/SET Patient? No      Pain Assessment   Currently in Pain? No/denies                Social History   Tobacco Use  Smoking Status Former  Smokeless Tobacco Never    Goals Met:  Independence with exercise equipment Exercise tolerated well No report of cardiac concerns or symptoms Strength training completed today  Goals Unmet:  Not Applicable  Comments: Pt able to follow exercise prescription today without complaint.  Will continue to monitor for progression.    Dr. Emily Filbert is Medical Director for [place].  Dr. Ottie Glazier is Medical Director for [facility] Pulmonary Rehabilitation.

## 2020-08-26 NOTE — Progress Notes (Signed)
Completed initial RD evaluation 

## 2020-08-28 ENCOUNTER — Other Ambulatory Visit: Payer: Self-pay

## 2020-08-28 ENCOUNTER — Encounter: Payer: Medicare Other | Admitting: *Deleted

## 2020-08-28 DIAGNOSIS — J8410 Pulmonary fibrosis, unspecified: Secondary | ICD-10-CM | POA: Diagnosis not present

## 2020-08-28 NOTE — Progress Notes (Signed)
Daily Session Note  Patient Details  Name: Susan Sosa MRN: [id number] Date of Birth: 08-08-1940 Referring Provider:   Flowsheet Row Pulmonary Rehab from 08/12/2020 in [facility] Cardiac and Pulmonary Rehab  Referring Provider Dr. Ottie Glazier MD       Encounter Date: 08/28/2020  Check In:  Session Check In - 08/28/20 1120       Check-In   Supervising physician immediately available to respond to emergencies See telemetry face sheet for immediately available ER MD    Location [facility]-Cardiac & Pulmonary Rehab    Staff Present Renita Papa, RN Vickki Hearing, BA, ACSM CEP, Exercise Physiologist;Melissa [place], RDN, LDN;Joseph [place], RCP,RRT,BSRT    Virtual Visit No    Medication changes reported     No    Fall or balance concerns reported    No    Warm-up and Cool-down Performed on first and last piece of equipment    Resistance Training Performed Yes    VAD Patient? No    PAD/SET Patient? No      Pain Assessment   Currently in Pain? No/denies                Social History   Tobacco Use  Smoking Status Former  Smokeless Tobacco Never    Goals Met:  Independence with exercise equipment Exercise tolerated well No report of cardiac concerns or symptoms Strength training completed today  Goals Unmet:  Not Applicable  Comments: Pt able to follow exercise prescription today without complaint.  Will continue to monitor for progression.    Dr. Emily Filbert is Medical Director for [place].  Dr. Ottie Glazier is Medical Director for [facility] Pulmonary Rehabilitation.

## 2020-09-02 ENCOUNTER — Other Ambulatory Visit: Payer: Self-pay

## 2020-09-02 DIAGNOSIS — J8410 Pulmonary fibrosis, unspecified: Secondary | ICD-10-CM

## 2020-09-02 NOTE — Progress Notes (Signed)
Daily Session Note  Patient Details  Name: Doneen Ollinger MRN: [id number] Date of Birth: [birth year] Referring Provider:   Flowsheet Row Pulmonary Rehab from 08/12/2020 in [facility] Card[facility] Pul[facility]  Referring Provider Dr. Ottie Glazier MD       Encounter Date: 09/02/2020  Check In:  Session Check In - 09/02/20 0948       Check-In   Supervising physician immediately available to respond to emergencies See telemetry face sheet for immediately available ER MD    Location [facility]-Cardiac & Pulmonary Rehab    Staff Present Birdie Sons, MPA, RN;Jessica Luan Pulling, MA, RCEP, CCRP, CCET;Amanda Sommer, BA, ACSM CEP, Exercise Physiologist    Virtual Visit No    Medication changes reported     No    Fall or balance concerns reported    No    Warm-up and Cool-down Performed on first and last piece of equipment    Resistance Training Performed Yes    VAD Patient? No    PAD/SET Patient? No      Pain Assessment   Currently in Pain? No/denies                Social History   Tobacco Use  Smoking Status Former  Smokeless Tobacco Never    Goals Met:  Independence with exercise equipment Exercise tolerated well No report of cardiac concerns or symptoms Strength training completed today  Goals Unmet:  Not Applicable  Comments: Pt able to follow exercise prescription today without complaint.  Will continue to monitor for progression.    Dr. Emily Filbert is Medical Director for [place].  Dr. Ottie Glazier is Medical Director for [facility] Pulmonary Rehabilitation.

## 2020-09-03 ENCOUNTER — Encounter: Payer: Self-pay | Admitting: *Deleted

## 2020-09-03 DIAGNOSIS — J8410 Pulmonary fibrosis, unspecified: Secondary | ICD-10-CM

## 2020-09-03 NOTE — Progress Notes (Signed)
Pulmonary Individual Treatment Plan  Patient Details  Name: Keyarra Rendall MRN: [id number] Date of Birth: 06-Feb-1940 Referring Provider:   Flowsheet Row Pulmonary Rehab from 08/12/2020 in [facility] Cardiac and Pulmonary Rehab  Referring Provider Dr. Vida Rigger MD       Initial Encounter Date:  Flowsheet Row Pulmonary Rehab from 08/12/2020 in [facility] Cardiac and Pulmonary Rehab  Date 08/12/20       Visit Diagnosis: Pulmonary fibrosis (HCC)  Patient's Home Medications on Admission:   Past Medical History: Past Medical History:  Diagnosis Date   Hypertension     Tobacco Use: Social History   Tobacco Use  Smoking Status Former  Smokeless Tobacco Never    Labs: Recent Review Flowsheet Data   There is no flowsheet data to display.      Pulmonary Assessment Scores:  Pulmonary Assessment Scores     Row Name 08/12/20 1440         ADL UCSD   ADL Phase Entry     SOB Score total 23     Rest 0     Walk 1     Stairs 3     Bath 1     Dress 1     Shop 1           CAT Score     CAT Score 11           mMRC Score     mMRC Score 1             UCSD: Self-administered rating of dyspnea associated with activities of daily living (ADLs) 6-point scale (0 = "not at all" to 5 = "maximal or unable to do because of breathlessness")  Scoring Scores range from 0 to 120.  Minimally important difference is 5 units  CAT: CAT can identify the health impairment of COPD patients and is better correlated with disease progression.  CAT has a scoring range of zero to 40. The CAT score is classified into four groups of low (less than 10), medium (10 - 20), high (21-30) and very high (31-40) based on the impact level of disease on health status. A CAT score over 10 suggests significant symptoms.  A worsening CAT score could be explained by an exacerbation, poor medication adherence, poor inhaler technique, or progression of COPD or comorbid conditions.  CAT MCID is 2  points  mMRC: mMRC (Modified Medical Research Council) Dyspnea Scale is used to assess the degree of baseline functional disability in patients of respiratory disease due to dyspnea. No minimal important difference is established. A decrease in score of 1 point or greater is considered a positive change.   Pulmonary Function Assessment:   Exercise Target Goals: Exercise Program Goal: Individual exercise prescription set using results from initial 6 min walk test and THRR while considering  patient's activity barriers and safety.   Exercise Prescription Goal: Initial exercise prescription builds to 30-45 minutes a day of aerobic activity, 2-3 days per week.  Home exercise guidelines will be given to patient during program as part of exercise prescription that the participant will acknowledge.  Education: Aerobic Exercise: - Group verbal and visual presentation on the components of exercise prescription. Introduces F.I.T.T principle from ACSM for exercise prescriptions.  Reviews F.I.T.T. principles of aerobic exercise including progression. Written material given at graduation.   Education: Resistance Exercise: - Group verbal and visual presentation on the components of exercise prescription. Introduces F.I.T.T principle from ACSM for exercise prescriptions  Reviews F.I.T.T. principles of resistance  exercise including progression. Written material given at graduation.    Education: Exercise & Equipment Safety: - Individual verbal instruction and demonstration of equipment use and safety with use of the equipment. Flowsheet Row Pulmonary Rehab from 08/28/2020 in [facility] Cardiac and Pulmonary Rehab  Education need identified 08/12/20  Date 08/12/20  Educator KL  Instruction Review Code 1- Verbalizes Understanding       Education: Exercise Physiology & General Exercise Guidelines: - Group verbal and written instruction with models to review the exercise physiology of the cardiovascular  system and associated critical values. Provides general exercise guidelines with specific guidelines to those with heart or lung disease.    Education: Flexibility, Balance, Mind/Body Relaxation: - Group verbal and visual presentation with interactive activity on the components of exercise prescription. Introduces F.I.T.T principle from ACSM for exercise prescriptions. Reviews F.I.T.T. principles of flexibility and balance exercise training including progression. Also discusses the mind body connection.  Reviews various relaxation techniques to help reduce and manage stress (i.e. Deep breathing, progressive muscle relaxation, and visualization). Balance handout provided to take home. Written material given at graduation.   Activity Barriers & Risk Stratification:  Activity Barriers & Cardiac Risk Stratification - 08/12/20 1158       Activity Barriers & Cardiac Risk Stratification   Activity Barriers Shortness of Breath;Joint Problems;Deconditioning;Back Problems;Muscular Weakness;Other (comment)    Comments Right knee- gets cortisone shots             6 Minute Walk:  6 Minute Walk     Row Name 08/12/20 1158         6 Minute Walk   Phase Initial     Distance 500 feet     Walk Time 3.5 minutes  Stopped at 3:30 due to O2 sats     # of Rest Breaks 0     MPH 1.62     METS 1.08     RPE 12     Perceived Dyspnea  2     VO2 Peak 3.78     Symptoms No     Resting HR 67 bpm     Resting BP 130/62     Resting Oxygen Saturation  97 %     Exercise Oxygen Saturation  during 6 min walk 79 %     Max Ex. HR 111 bpm     Max Ex. BP 162/68     2 Minute Post BP 134/68           Interval HR     1 Minute HR 87     2 Minute HR 101     3 Minute HR 111  Stopped at 3:30 due to O2 < 80%     Interval Heart Rate? Yes           Interval Oxygen     Interval Oxygen? Yes     Baseline Oxygen Saturation % 97 %     1 Minute Oxygen Saturation % 93 %     1 Minute Liters of Oxygen 2 L     2 Minute  Oxygen Saturation % 87 %     2 Minute Liters of Oxygen 2 L     3 Minute Oxygen Saturation % 79 %     3 Minute Liters of Oxygen 2 L     4 Minute Oxygen Saturation % --  stopped     5 Minute Oxygen Saturation % --  stopped     6 Minute Oxygen Saturation % --  stopped     2 Minute Post Oxygen Saturation % 97 %     2 Minute Post Liters of Oxygen 3 L           Oxygen Initial Assessment:  Oxygen Initial Assessment - 08/12/20 1438       Home Oxygen   Home Oxygen Device Home Concentrator;Portable Concentrator    Sleep Oxygen Prescription Continuous    Liters per minute 2    Home Exercise Oxygen Prescription Continuous    Liters per minute 2    Home Resting Oxygen Prescription Continuous   Portable- pulsed   Liters per minute 2    Compliance with Home Oxygen Use Yes      Initial 6 min Walk   Oxygen Used Continuous    Liters per minute 2      Program Oxygen Prescription   Program Oxygen Prescription Continuous    Liters per minute 2      Intervention   Short Term Goals To learn and exhibit compliance with exercise, home and travel O2 prescription;To learn and understand importance of monitoring SPO2 with pulse oximeter and demonstrate accurate use of the pulse oximeter.;To learn and understand importance of maintaining oxygen saturations>88%;To learn and demonstrate proper pursed lip breathing techniques or other breathing techniques. ;To learn and demonstrate proper use of respiratory medications    Long  Term Goals Exhibits compliance with exercise, home  and travel O2 prescription;Verbalizes importance of monitoring SPO2 with pulse oximeter and return demonstration;Maintenance of O2 saturations>88%;Exhibits proper breathing techniques, such as pursed lip breathing or other method taught during program session;Compliance with respiratory medication;Demonstrates proper use of MDI's             Oxygen Re-Evaluation:  Oxygen Re-Evaluation     Row Name 08/14/20 0947              Program Oxygen Prescription   Program Oxygen Prescription Continuous       Liters per minute 2               Home Oxygen     Home Oxygen Device Home Concentrator;Portable Concentrator       Sleep Oxygen Prescription Continuous       Liters per minute 2       Home Exercise Oxygen Prescription Continuous       Liters per minute 2       Home Resting Oxygen Prescription Continuous       Liters per minute 2       Compliance with Home Oxygen Use Yes               Goals/Expected Outcomes     Short Term Goals To learn and demonstrate proper pursed lip breathing techniques or other breathing techniques.        Long  Term Goals Exhibits proper breathing techniques, such as pursed lip breathing or other method taught during program session       Comments Reviewed PLB technique with pt.  Talked about how it works and it's importance in maintaining their exercise saturations.      Short: Become more profiecient at using PLB.   Long: Become independent at using PLB.       Goals/Expected Outcomes Short: Become more profiecient at using PLB.   Long: Become independent at using PLB.               Oxygen Discharge (Final Oxygen Re-Evaluation):  Oxygen Re-Evaluation - 08/14/20 1610  Program Oxygen Prescription   Program Oxygen Prescription Continuous    Liters per minute 2      Home Oxygen   Home Oxygen Device Home Concentrator;Portable Concentrator    Sleep Oxygen Prescription Continuous    Liters per minute 2    Home Exercise Oxygen Prescription Continuous    Liters per minute 2    Home Resting Oxygen Prescription Continuous    Liters per minute 2    Compliance with Home Oxygen Use Yes      Goals/Expected Outcomes   Short Term Goals To learn and demonstrate proper pursed lip breathing techniques or other breathing techniques.     Long  Term Goals Exhibits proper breathing techniques, such as pursed lip breathing or other method taught during program session    Comments Reviewed  PLB technique with pt.  Talked about how it works and it's importance in maintaining their exercise saturations.      Short: Become more profiecient at using PLB.   Long: Become independent at using PLB.    Goals/Expected Outcomes Short: Become more profiecient at using PLB.   Long: Become independent at using PLB.             Initial Exercise Prescription:  Initial Exercise Prescription - 08/12/20 1500       Date of Initial Exercise RX and Referring Provider   Date 08/12/20    Referring Provider Dr. Karna Christmas, Luis Abed MD      Treadmill   MPH 1.5    Grade 0    Minutes 15    METs 2.15      Recumbant Bike   Level 1    RPM 60    Watts 10    Minutes 15    METs 1.08      NuStep   Level 1    SPM 80    Minutes 15    METs 1.08      T5 Nustep   Level 1    SPM 80    Minutes 15    METs 1.08      Prescription Details   Frequency (times per week) 2    Duration Progress to 30 minutes of continuous aerobic without signs/symptoms of physical distress      Intensity   THRR 40-80% of Max Heartrate 95-124    Ratings of Perceived Exertion 11-13    Perceived Dyspnea 0-4      Progression   Progression Continue to progress workloads to maintain intensity without signs/symptoms of physical distress.      Resistance Training   Training Prescription Yes    Weight 3 lb    Reps 10-15             Perform Capillary Blood Glucose checks as needed.  Exercise Prescription Changes:   Exercise Prescription Changes     Row Name 08/12/20 1500 08/27/20 1100           Response to Exercise   Blood Pressure (Admit) 130/62 128/82      Blood Pressure (Exercise) 162/68 132/74      Blood Pressure (Exit) 134/66 122/60      Heart Rate (Admit) 67 bpm 71 bpm      Heart Rate (Exercise) 111 bpm 99 bpm      Heart Rate (Exit) 72 bpm 82 bpm      Oxygen Saturation (Admit) 97 % 96 %      Oxygen Saturation (Exercise) 79 % 94 %      Oxygen Saturation (  Exit) 97 % 98 %      Rating of Perceived  Exertion (Exercise) 12 12      Perceived Dyspnea (Exercise) 2 1      Symptoms none none      Comments walk test results --      Duration -- Progress to 30 minutes of  aerobic without signs/symptoms of physical distress      Intensity -- THRR unchanged             Progression      Progression -- Continue to progress workloads to maintain intensity without signs/symptoms of physical distress.      Average METs -- 1.8             Resistance Training      Training Prescription -- Yes      Weight -- 3 lb      Reps -- 10-15             Interval Training      Interval Training -- No             Treadmill      MPH -- 1.5      Grade -- 0      Minutes -- 15      METs -- 2.15             NuStep      Level -- 1      Minutes -- 15      METs -- 1.8              Exercise Comments:   Exercise Comments     Row Name 08/14/20 0946           Exercise Comments First full day of exercise!  Patient was oriented to gym and equipment including functions, settings, policies, and procedures.  Patient's individual exercise prescription and treatment plan were reviewed.  All starting workloads were established based on the results of the 6 minute walk test done at initial orientation visit.  The plan for exercise progression was also introduced and progression will be customized based on patient's performance and goals.                Exercise Goals and Review:   Exercise Goals     Row Name 08/12/20 1509             Exercise Goals   Increase Physical Activity Yes       Intervention Provide advice, education, support and counseling about physical activity/exercise needs.;Develop an individualized exercise prescription for aerobic and resistive training based on initial evaluation findings, risk stratification, comorbidities and participant's personal goals.       Expected Outcomes Long Term: Add in home exercise to make exercise part of routine and to increase amount of  physical activity.;Short Term: Attend rehab on a regular basis to increase amount of physical activity.;Long Term: Exercising regularly at least 3-5 days a week.       Increase Strength and Stamina Yes       Intervention Provide advice, education, support and counseling about physical activity/exercise needs.;Develop an individualized exercise prescription for aerobic and resistive training based on initial evaluation findings, risk stratification, comorbidities and participant's personal goals.       Expected Outcomes Short Term: Increase workloads from initial exercise prescription for resistance, speed, and METs.;Short Term: Perform resistance training exercises routinely during rehab and add in resistance training at home;Long Term: Improve cardiorespiratory fitness, muscular endurance and  strength as measured by increased METs and functional capacity ( )       Able to understand and use rate of perceived exertion (RPE) scale Yes       Intervention Provide education and explanation on how to use RPE scale       Expected Outcomes Short Term: Able to use RPE daily in rehab to express subjective intensity level;Long Term:  Able to use RPE to guide intensity level when exercising independently       Able to understand and use Dyspnea scale Yes       Intervention Provide education and explanation on how to use Dyspnea scale       Expected Outcomes Short Term: Able to use Dyspnea scale daily in rehab to express subjective sense of shortness of breath during exertion;Long Term: Able to use Dyspnea scale to guide intensity level when exercising independently       Knowledge and understanding of Target Heart Rate Range (THRR) Yes       Intervention Provide education and explanation of THRR including how the numbers were predicted and where they are located for reference       Expected Outcomes Short Term: Able to state/look up THRR;Long Term: Able to use THRR to govern intensity when exercising  independently;Short Term: Able to use daily as guideline for intensity in rehab       Able to check pulse independently Yes       Intervention Provide education and demonstration on how to check pulse in carotid and radial arteries.;Review the importance of being able to check your own pulse for safety during independent exercise       Expected Outcomes Short Term: Able to explain why pulse checking is important during independent exercise;Long Term: Able to check pulse independently and accurately       Understanding of Exercise Prescription Yes       Intervention Provide education, explanation, and written materials on patient's individual exercise prescription       Expected Outcomes Short Term: Able to explain program exercise prescription;Long Term: Able to explain home exercise prescription to exercise independently                Exercise Goals Re-Evaluation :  Exercise Goals Re-Evaluation     Row Name 08/14/20 0946 08/27/20 1132           Exercise Goal Re-Evaluation   Exercise Goals Review Understanding of Exercise Prescription;Able to check pulse independently;Knowledge and understanding of Target Heart Rate Range (THRR);Able to understand and use Dyspnea scale;Able to understand and use rate of perceived exertion (RPE) scale Increase Physical Activity;Increase Strength and Stamina      Comments Reviewed RPE and dyspnea scales, THR and program prescription with pt today.  Pt voiced understanding and was given a copy of goals to take home. Jadis is doing well in her first sessions.  Lowest oxygen is 93% during exercise.  Staff will monitor progress.      Expected Outcomes Short: Use RPE daily to regulate intensity. Long: Follow program prescription in THR. Short:  attend consistently Long:  build overall stamina               Discharge Exercise Prescription (Final Exercise Prescription Changes):  Exercise Prescription Changes - 08/27/20 1100       Response to Exercise    Blood Pressure (Admit) 128/82    Blood Pressure (Exercise) 132/74    Blood Pressure (Exit) 122/60    Heart Rate (Admit) 71 bpm  Heart Rate (Exercise) 99 bpm    Heart Rate (Exit) 82 bpm    Oxygen Saturation (Admit) 96 %    Oxygen Saturation (Exercise) 94 %    Oxygen Saturation (Exit) 98 %    Rating of Perceived Exertion (Exercise) 12    Perceived Dyspnea (Exercise) 1    Symptoms none    Duration Progress to 30 minutes of  aerobic without signs/symptoms of physical distress    Intensity THRR unchanged      Progression   Progression Continue to progress workloads to maintain intensity without signs/symptoms of physical distress.    Average METs 1.8      Resistance Training   Training Prescription Yes    Weight 3 lb    Reps 10-15      Interval Training   Interval Training No      Treadmill   MPH 1.5    Grade 0    Minutes 15    METs 2.15      NuStep   Level 1    Minutes 15    METs 1.8             Nutrition:  Target Goals: Understanding of nutrition guidelines, daily intake of sodium 1500mg , cholesterol 200mg , calories 30% from fat and 7% or less from saturated fats, daily to have 5 or more servings of fruits and vegetables.  Education: All About Nutrition: -Group instruction provided by verbal, written material, interactive activities, discussions, models, and posters to present general guidelines for heart healthy nutrition including fat, fiber, MyPlate, the role of sodium in heart healthy nutrition, utilization of the nutrition label, and utilization of this knowledge for meal planning. Follow up email sent as well. Written material given at graduation. Flowsheet Row Pulmonary Rehab from 08/28/2020 in [facility] Cardiac and Pulmonary Rehab  Date 08/19/20  Educator [facility]  Instruction Review Code 1- Verbalizes Understanding       Biometrics:  Pre Biometrics - 08/12/20 1157       Pre Biometrics   Height 5' (1.524 m)    Weight 145 lb 11.2 oz (66.1 kg)    BMI  (Calculated) 28.46    Single Leg Stand 10.03 seconds              Nutrition Therapy Plan and Nutrition Goals:  Nutrition Therapy & Goals - 08/26/20 0920       Nutrition Therapy   Diet Heart healthy, low Na, pulmonary MNT    Drug/Food Interactions Statins/Certain Fruits    Protein (specify units) 80g    Fiber 25 grams    Whole Grain Foods 3 servings    Saturated Fats 12 max. grams    Fruits and Vegetables 8 servings/day    Sodium 1.5 grams      Personal Nutrition Goals   Nutrition Goal ST: try soymilk, add avocado or peanut butter to lunch, swap out protein shake (Ensure, Boost, Orgain) LT: maintain weight    Comments B: cereal (frosted flakes and mini wheats and almond milk) or sausage biscuit S: premier protein L: not hungry - she thinks this is due to her medication. 1-2 eggs and bread D: meat (chicken, steak, limits her pork intake (she feels she is allergic) - grilled)and some vegetables (peas, pinto beans, beets, corn, small amounts of potato - if she does it is a sweet potato, spinach, turnip greens - little grapeseed oil) S: apple. Drinks: water.  She will cook beans from dried. Does not cook with salt. She has a lower appetite -  has been for years. She has lost 30 lbs over 3-4 months (2 years ago) - gained 10 pounds back. She drinks premier protein which has recently been recalled - provided information on lot numbers and encourgaed her to check her products - suggested swapping and trying Ensure, Orgain, or Boost. suggested swapping almond milk for fortified soymilk and adding avocados or peanut butter to her lunch. Discussed heart healthy eating and pulmonary friendly eating.      Intervention Plan   Intervention Prescribe, educate and counsel regarding individualized specific dietary modifications aiming towards targeted core components such as weight, hypertension, lipid management, diabetes, heart failure and other comorbidities.;Nutrition handout(s) given to patient.     Expected Outcomes Short Term Goal: Understand basic principles of dietary content, such as calories, fat, sodium, cholesterol and nutrients.;Short Term Goal: A plan has been developed with personal nutrition goals set during dietitian appointment.;Long Term Goal: Adherence to prescribed nutrition plan.             Nutrition Assessments:  MEDIFICTS Score Key: ?70 Need to make dietary changes  40-70 Heart Healthy Diet ? 40 Therapeutic Level Cholesterol Diet  Flowsheet Row Pulmonary Rehab from 08/12/2020 in [facility] Cardiac and Pulmonary Rehab  Picture Your Plate Total Score on Admission 66      Picture Your Plate Scores: <16 Unhealthy dietary pattern with much room for improvement. 41-50 Dietary pattern unlikely to meet recommendations for good health and room for improvement. 51-60 More healthful dietary pattern, with some room for improvement.  >60 Healthy dietary pattern, although there may be some specific behaviors that could be improved.   Nutrition Goals Re-Evaluation:   Nutrition Goals Discharge (Final Nutrition Goals Re-Evaluation):   Psychosocial: Target Goals: Acknowledge presence or absence of significant depression and/or stress, maximize coping skills, provide positive support system. Participant is able to verbalize types and ability to use techniques and skills needed for reducing stress and depression.   Education: Stress, Anxiety, and Depression - Group verbal and visual presentation to define topics covered.  Reviews how body is impacted by stress, anxiety, and depression.  Also discusses healthy ways to reduce stress and to treat/manage anxiety and depression.  Written material given at graduation.   Education: Sleep Hygiene -Provides group verbal and written instruction about how sleep can affect your health.  Define sleep hygiene, discuss sleep cycles and impact of sleep habits. Review good sleep hygiene tips.    Initial Review & Psychosocial Screening:   Initial Psych Review & Screening - 08/04/20 1545       Initial Review   Current issues with Current Sleep Concerns      Family Dynamics   Good Support System? Yes   husband     Barriers   Psychosocial barriers to participate in program There are no identifiable barriers or psychosocial needs.;The patient should benefit from training in stress management and relaxation.      Screening Interventions   Interventions Encouraged to exercise;Provide feedback about the scores to participant;To provide support and resources with identified psychosocial needs    Expected Outcomes Short Term goal: Utilizing psychosocial counselor, staff and physician to assist with identification of specific Stressors or current issues interfering with healing process. Setting desired goal for each stressor or current issue identified.;Long Term Goal: Stressors or current issues are controlled or eliminated.;Short Term goal: Identification and review with participant of any Quality of Life or Depression concerns found by scoring the questionnaire.;Long Term goal: The participant improves quality of Life and PHQ9 Scores as  seen by post scores and/or verbalization of changes             Quality of Life Scores:  Scores of 19 and below usually indicate a poorer quality of life in these areas.  A difference of  2-3 points is a clinically meaningful difference.  A difference of 2-3 points in the total score of the Quality of Life Index has been associated with significant improvement in overall quality of life, self-image, physical symptoms, and general health in studies assessing change in quality of life.  PHQ-9: Recent Review Flowsheet Data     Depression screen [facility] 2/9 08/12/2020   Decreased Interest 0   Down, Depressed, Hopeless 0   PHQ - 2 Score 0   Altered sleeping 0   Tired, decreased energy 1   Change in appetite 0   Feeling bad or failure about yourself  0   Trouble concentrating 0   Moving slowly or  fidgety/restless 0   Suicidal thoughts 0   PHQ-9 Score 1   Difficult doing work/chores Not difficult at all      Interpretation of Total Score  Total Score Depression Severity:  1-4 = Minimal depression, 5-9 = Mild depression, 10-14 = Moderate depression, 15-19 = Moderately severe depression, 20-27 = Severe depression   Psychosocial Evaluation and Intervention:  Psychosocial Evaluation - 08/04/20 1546       Psychosocial Evaluation & Interventions   Interventions Encouraged to exercise with the program and follow exercise prescription    Comments Ms. 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 She has been dealing with this diagnosis for the past two years, but is proud that the doctor said she seems to be holding her own. She reports that she already has lost 30% of her lung capacity and is working hard to maintain her current lung function which is why she is motivated to come to rehab. She walks 2-3 times a week at [facility] and is diligent about her breathing exercises and medications. She wears oxygen as well. Her husband is her main support system. She does not report any stressors currently as she feels like she is handling her current health status appropriately. She has noticed in the last 6 months that her sleep has been altered, where she used to fall asleep quickly now it is taking her over an hour to fall back asleep after waking to use the bathroom.    Expected Outcomes Short: attend pulmonary rehab for education and exercise. Long: develop and maintain positive self care habits.    Continue Psychosocial Services  Follow up required by staff             Psychosocial Re-Evaluation:   Psychosocial Discharge (Final Psychosocial Re-Evaluation):   Education: Education Goals: Education classes will be provided on a weekly basis, covering required topics. Participant will state understanding/return demonstration of topics presented.  Learning  Barriers/Preferences:   General Pulmonary Education Topics:  Infection Prevention: - Provides verbal and written material to individual with discussion of infection control including proper hand washing and proper equipment cleaning during exercise session. Flowsheet Row Pulmonary Rehab from 08/28/2020 in [facility] Cardiac and Pulmonary Rehab  Education need identified 08/12/20  Date 08/12/20  Educator KL  Instruction Review Code 1- Verbalizes Understanding       Falls Prevention: - Provides verbal and written material to individual with discussion of falls prevention and safety. Flowsheet Row Pulmonary Rehab from 08/28/2020 in [facility] Cardiac and Pulmonary Rehab  Education  need identified 08/12/20  Date 08/12/20  Educator KL  Instruction Review Code 1- Verbalizes Understanding       Chronic Lung Disease Review: - Group verbal instruction with posters, models, PowerPoint presentations and videos,  to review new updates, new respiratory medications, new advancements in procedures and treatments. Providing information on websites and "800" numbers for continued self-education. Includes information about supplement oxygen, available portable oxygen systems, continuous and intermittent flow rates, oxygen safety, concentrators, and Medicare reimbursement for oxygen. Explanation of Pulmonary Drugs, including class, frequency, complications, importance of spacers, rinsing mouth after steroid MDI's, and proper cleaning methods for nebulizers. Review of basic lung anatomy and physiology related to function, structure, and complications of lung disease. Review of risk factors. Discussion about methods for diagnosing sleep apnea and types of masks and machines for OSA. Includes a review of the use of types of environmental controls: home humidity, furnaces, filters, dust mite/pet prevention, HEPA vacuums. Discussion about weather changes, air quality and the benefits of nasal washing. Instruction on Warning  signs, infection symptoms, calling MD promptly, preventive modes, and value of vaccinations. Review of effective airway clearance, coughing and/or vibration techniques. Emphasizing that all should Create an Action Plan. Written material given at graduation. Flowsheet Row Pulmonary Rehab from 08/28/2020 in [facility] Cardiac and Pulmonary Rehab  Date 08/28/20  Educator [facility]  Instruction Review Code 1- Verbalizes Understanding       AED/CPR: - Group verbal and written instruction with the use of models to demonstrate the basic use of the AED with the basic ABC's of resuscitation.    Anatomy and Cardiac Procedures: - Group verbal and visual presentation and models provide information about basic cardiac anatomy and function. Reviews the testing methods done to diagnose heart disease and the outcomes of the test results. Describes the treatment choices: Medical Management, Angioplasty, or Coronary Bypass Surgery for treating various heart conditions including Myocardial Infarction, Angina, Valve Disease, and Cardiac Arrhythmias.  Written material given at graduation.   Medication Safety: - Group verbal and visual instruction to review commonly prescribed medications for heart and lung disease. Reviews the medication, class of the drug, and side effects. Includes the steps to properly store meds and maintain the prescription regimen.  Written material given at graduation. Flowsheet Row Pulmonary Rehab from 08/28/2020 in [facility] Cardiac and Pulmonary Rehab  Date 08/14/20  Educator SB  Instruction Review Code 1- Verbalizes Understanding       Other: -Provides group and verbal instruction on various topics (see comments)   Knowledge Questionnaire Score:  Knowledge Questionnaire Score - 08/12/20 1443       Knowledge Questionnaire Score   Pre Score 15/18: Oxygen saturation, Albuterol, Oxygen flame              Core Components/Risk Factors/Patient Goals at Admission:  Personal Goals and Risk  Factors at Admission - 08/12/20 1508       Core Components/Risk Factors/Patient Goals on Admission    Weight Management Yes;Weight Maintenance    Intervention Weight Management: Develop a combined nutrition and exercise program designed to reach desired caloric intake, while maintaining appropriate intake of nutrient and fiber, sodium and fats, and appropriate energy expenditure required for the weight goal.;Weight Management: Provide education and appropriate resources to help participant work on and attain dietary goals.;Weight Management/Obesity: Establish reasonable short term and long term weight goals.    Admit Weight 145 lb (65.8 kg)    Goal Weight: Short Term 145 lb (65.8 kg)    Goal Weight: Long Term 145  lb (65.8 kg)    Expected Outcomes Short Term: Continue to assess and modify interventions until short term weight is achieved;Weight Maintenance: Understanding of the daily nutrition guidelines, which includes 25-35% calories from fat, 7% or less cal from saturated fats, less than 200mg  cholesterol, less than 1.5gm of sodium, & 5 or more servings of fruits and vegetables daily;Understanding recommendations for meals to include 15-35% energy as protein, 25-35% energy from fat, 35-60% energy from carbohydrates, less than 200mg  of dietary cholesterol, 20-35 gm of total fiber daily;Understanding of distribution of calorie intake throughout the day with the consumption of 4-5 meals/snacks    Improve shortness of breath with ADL's Yes    Intervention Provide education, individualized exercise plan and daily activity instruction to help decrease symptoms of SOB with activities of daily living.    Expected Outcomes Short Term: Improve cardiorespiratory fitness to achieve a reduction of symptoms when performing ADLs;Long Term: Be able to perform more ADLs without symptoms or delay the onset of symptoms    Hypertension Yes    Intervention Provide education on lifestyle modifcations including regular  physical activity/exercise, weight management, moderate sodium restriction and increased consumption of fresh fruit, vegetables, and low fat dairy, alcohol moderation, and smoking cessation.;Monitor prescription use compliance.    Expected Outcomes Short Term: Continued assessment and intervention until BP is < 140/40mm HG in hypertensive participants. < 130/52mm HG in hypertensive participants with diabetes, heart failure or chronic kidney disease.;Long Term: Maintenance of blood pressure at goal levels.    Lipids Yes    Intervention Provide education and support for participant on nutrition & aerobic/resistive exercise along with prescribed medications to achieve LDL 70mg , HDL >40mg .    Expected Outcomes Short Term: Participant states understanding of desired cholesterol values and is compliant with medications prescribed. Participant is following exercise prescription and nutrition guidelines.;Long Term: Cholesterol controlled with medications as prescribed, with individualized exercise RX and with personalized nutrition plan. Value goals: LDL < 70mg , HDL > 40 mg.             Education:Diabetes - Individual verbal and written instruction to review signs/symptoms of diabetes, desired ranges of glucose level fasting, after meals and with exercise. Acknowledge that pre and post exercise glucose checks will be done for 3 sessions at entry of program.   Know Your Numbers and Heart Failure: - Group verbal and visual instruction to discuss disease risk factors for cardiac and pulmonary disease and treatment options.  Reviews associated critical values for Overweight/Obesity, Hypertension, Cholesterol, and Diabetes.  Discusses basics of heart failure: signs/symptoms and treatments.  Introduces Heart Failure Zone chart for action plan for heart failure.  Written material given at graduation. Flowsheet Row Pulmonary Rehab from 08/28/2020 in [facility] Cardiac and Pulmonary Rehab  Date 08/21/20  Educator SB   Instruction Review Code 1- Verbalizes Understanding       Core Components/Risk Factors/Patient Goals Review:    Core Components/Risk Factors/Patient Goals at Discharge (Final Review):    ITP Comments:  ITP Comments     Row Name 08/04/20 1557 08/12/20 1141 08/14/20 0946 08/26/20 1014 09/03/20 0738   ITP Comments Initial telephone orientation completed. [facility] can be found in [facility] 6/24. EP orientation scheduled for Tuesday 7/19 at 10am. Completed [facility] and gym orientation. Initial ITP created and sent for review to Dr. 7/24, Medical Director. First full day of exercise!  Patient was oriented to gym and equipment including functions, settings, policies, and procedures.  Patient's individual exercise prescription and treatment plan were reviewed.  All starting workloads were established based on the results of the 6 minute walk test done at initial orientation visit.  The plan for exercise progression was also introduced and progression will be customized based on patient's performance and goals. Completed initial RD evaluation 30 Day review completed. Medical Director ITP review done, changes made as directed, and signed approval by Medical Director.            Comments:

## 2020-09-04 ENCOUNTER — Other Ambulatory Visit: Payer: Self-pay

## 2020-09-04 DIAGNOSIS — J8410 Pulmonary fibrosis, unspecified: Secondary | ICD-10-CM | POA: Diagnosis not present

## 2020-09-04 NOTE — Progress Notes (Signed)
Daily Session Note  Patient Details  Name: Susan Sosa MRN: [id number] Date of Birth: 1940/06/27 Referring Provider:   Flowsheet Row Pulmonary Rehab from 08/12/2020 in [facility] Cardiac and Pulmonary Rehab  Referring Provider Dr. Ottie Glazier MD       Encounter Date: 09/04/2020  Check In:  Session Check In - 09/04/20 0958       Check-In   Supervising physician immediately available to respond to emergencies See telemetry face sheet for immediately available ER MD    Location [facility]-Cardiac & Pulmonary Rehab    Staff Present Birdie Sons, MPA, Mauricia Area, BS, ACSM CEP, Exercise Physiologist;Amanda Oletta Darter, BA, ACSM CEP, Exercise Physiologist;Melissa Caiola, RDN, LDN    Virtual Visit No    Medication changes reported     No    Fall or balance concerns reported    No    Warm-up and Cool-down Performed on first and last piece of equipment    Resistance Training Performed Yes    VAD Patient? No    PAD/SET Patient? No      Pain Assessment   Currently in Pain? No/denies                Social History   Tobacco Use  Smoking Status Former  Smokeless Tobacco Never    Goals Met:  Independence with exercise equipment Exercise tolerated well No report of cardiac concerns or symptoms Strength training completed today  Goals Unmet:  Not Applicable  Comments: Pt able to follow exercise prescription today without complaint.  Will continue to monitor for progression.    Dr. Emily Filbert is Medical Director for [place].  Dr. Ottie Glazier is Medical Director for [facility] Pulmonary Rehabilitation.

## 2020-09-16 ENCOUNTER — Other Ambulatory Visit: Payer: Self-pay

## 2020-09-16 DIAGNOSIS — J8410 Pulmonary fibrosis, unspecified: Secondary | ICD-10-CM

## 2020-09-16 NOTE — Progress Notes (Signed)
Daily Session Note  Patient Details  Name: Susan Sosa MRN: [id number] Date of Birth: 30-Mar-1940 Referring Provider:   Flowsheet Row Pulmonary Rehab from 08/12/2020 in [facility] Cardiac and Pulmonary Rehab  Referring Provider Dr. Ottie Glazier MD       Encounter Date: 09/16/2020  Check In:  Session Check In - 09/16/20 0956       Check-In   Supervising physician immediately available to respond to emergencies See telemetry face sheet for immediately available ER MD    Location [facility]-Cardiac & Pulmonary Rehab    Staff Present Birdie Sons, MPA, Nino Glow, MS, ASCM CEP, Exercise Physiologist;Amanda Oletta Darter, BA, ACSM CEP, Exercise Physiologist;Jessica Luan Pulling, MA, RCEP, CCRP, CCET    Virtual Visit No    Medication changes reported     No    Fall or balance concerns reported    No    Warm-up and Cool-down Performed on first and last piece of equipment    Resistance Training Performed Yes    VAD Patient? No    PAD/SET Patient? No      Pain Assessment   Currently in Pain? No/denies                Social History   Tobacco Use  Smoking Status Former  Smokeless Tobacco Never    Goals Met:  Independence with exercise equipment Exercise tolerated well No report of cardiac concerns or symptoms Strength training completed today  Goals Unmet:  Not Applicable  Comments: Pt able to follow exercise prescription today without complaint.  Will continue to monitor for progression.    Dr. Emily Filbert is Medical Director for [place].  Dr. Ottie Glazier is Medical Director for [facility] Pulmonary Rehabilitation.

## 2020-09-18 ENCOUNTER — Other Ambulatory Visit: Payer: Self-pay

## 2020-09-18 DIAGNOSIS — J8410 Pulmonary fibrosis, unspecified: Secondary | ICD-10-CM | POA: Diagnosis not present

## 2020-09-18 NOTE — Progress Notes (Signed)
Daily Session Note  Patient Details  Name: Susan Sosa MRN: [id number] Date of Birth: 1940-07-24 Referring Provider:   Flowsheet Row Pulmonary Rehab from 08/12/2020 in [facility] Cardiac and Pulmonary Rehab  Referring Provider Dr. Ottie Glazier MD       Encounter Date: 09/18/2020  Check In:  Session Check In - 09/18/20 0957       Check-In   Supervising physician immediately available to respond to emergencies See telemetry face sheet for immediately available ER MD    Location [facility]-Cardiac & Pulmonary Rehab    Staff Present Birdie Sons, MPA, Mauricia Area, BS, ACSM CEP, Exercise Physiologist;Amanda Oletta Darter, BA, ACSM CEP, Exercise Physiologist;Melissa Caiola, RDN, LDN    Virtual Visit No    Medication changes reported     No    Fall or balance concerns reported    No    Warm-up and Cool-down Performed on first and last piece of equipment    Resistance Training Performed Yes    VAD Patient? No    PAD/SET Patient? No      Pain Assessment   Currently in Pain? No/denies                Social History   Tobacco Use  Smoking Status Former  Smokeless Tobacco Never    Goals Met:  Independence with exercise equipment Exercise tolerated well No report of cardiac concerns or symptoms Strength training completed today  Goals Unmet:  Not Applicable  Comments: Pt able to follow exercise prescription today without complaint.  Will continue to monitor for progression.    Dr. Emily Filbert is Medical Director for [place].  Dr. Ottie Glazier is Medical Director for [facility] Pulmonary Rehabilitation.

## 2020-09-23 ENCOUNTER — Other Ambulatory Visit: Payer: Self-pay

## 2020-09-23 DIAGNOSIS — J8410 Pulmonary fibrosis, unspecified: Secondary | ICD-10-CM | POA: Diagnosis not present

## 2020-09-23 NOTE — Progress Notes (Signed)
Daily Session Note  Patient Details  Name: Susan Sosa MRN: [id number] Date of Birth: August 29, 1940 Referring Provider:   Flowsheet Row Pulmonary Rehab from 08/12/2020 in [facility] Cardiac and Pulmonary Rehab  Referring Provider Dr. Ottie Glazier MD       Encounter Date: 09/23/2020  Check In:  Session Check In - 09/23/20 0946       Check-In   Supervising physician immediately available to respond to emergencies See telemetry face sheet for immediately available ER MD    Location [facility]-Cardiac & Pulmonary Rehab    Staff Present Birdie Sons, MPA, RN;Jessica Luan Pulling, MA, RCEP, CCRP, CCET;Amanda Sommer, BA, ACSM CEP, Exercise Physiologist    Virtual Visit No    Medication changes reported     No    Fall or balance concerns reported    No    Warm-up and Cool-down Performed on first and last piece of equipment    Resistance Training Performed Yes    VAD Patient? No    PAD/SET Patient? No      Pain Assessment   Currently in Pain? No/denies                Social History   Tobacco Use  Smoking Status Former  Smokeless Tobacco Never    Goals Met:  Independence with exercise equipment Exercise tolerated well No report of concerns or symptoms today Strength training completed today  Goals Unmet:  Not Applicable  Comments: Pt able to follow exercise prescription today without complaint.  Will continue to monitor for progression.    Dr. Emily Filbert is Medical Director for [place].  Dr. Ottie Glazier is Medical Director for [facility] Pulmonary Rehabilitation.

## 2020-09-25 ENCOUNTER — Other Ambulatory Visit: Payer: Self-pay

## 2020-09-25 ENCOUNTER — Encounter: Payer: Medicare Other | Attending: Pulmonary Disease

## 2020-09-25 DIAGNOSIS — J8410 Pulmonary fibrosis, unspecified: Secondary | ICD-10-CM

## 2020-09-25 NOTE — Progress Notes (Signed)
Daily Session Note  Patient Details  Name: Susan Sosa MRN: [id number] Date of Birth: 1940-02-16 Referring Provider:   Flowsheet Row Pulmonary Rehab from 08/12/2020 in [facility] Cardiac and Pulmonary Rehab  Referring Provider Dr. Ottie Glazier MD       Encounter Date: 09/25/2020  Check In:  Session Check In - 09/25/20 0945       Check-In   Supervising physician immediately available to respond to emergencies See telemetry face sheet for immediately available ER MD    Location [facility]-Cardiac & Pulmonary Rehab    Staff Present Birdie Sons, MPA, RN;Jessica [place], MA, RCEP, CCRP, CCET;Joseph Leighton, RCP,RRT,BSRT;Melissa [place], [place], Wilhelmina Mcardle, BS, ACSM CEP, Exercise Physiologist    Virtual Visit No    Medication changes reported     No    Fall or balance concerns reported    No    Warm-up and Cool-down Performed on first and last piece of equipment    Resistance Training Performed Yes    VAD Patient? No    PAD/SET Patient? No      Pain Assessment   Currently in Pain? No/denies                Social History   Tobacco Use  Smoking Status Former  Smokeless Tobacco Never    Goals Met:  Independence with exercise equipment Exercise tolerated well No report of concerns or symptoms today Strength training completed today  Goals Unmet:  Not Applicable  Comments: Pt able to follow exercise prescription today without complaint.  Will continue to monitor for progression.    Dr. Emily Filbert is Medical Director for [place].  Dr. Ottie Glazier is Medical Director for [facility] Pulmonary Rehabilitation.

## 2020-09-30 ENCOUNTER — Other Ambulatory Visit: Payer: Self-pay

## 2020-09-30 DIAGNOSIS — J8410 Pulmonary fibrosis, unspecified: Secondary | ICD-10-CM

## 2020-09-30 NOTE — Progress Notes (Signed)
Daily Session Note  Patient Details  Name: Susan Sosa MRN: [id number] Date of Birth: [birth year] Referring Provider:   Flowsheet Row Pulmonary Rehab from 08/12/2020 in [facility] Cardiac and Pulmonary Rehab  Referring Provider Dr. Ottie Glazier MD       Encounter Date: 09/30/2020  Check In:  Session Check In - 09/30/20 0952       Check-In   Supervising physician immediately available to respond to emergencies See telemetry face sheet for immediately available ER MD    Location [facility]-Cardiac & Pulmonary Rehab    Staff Present Birdie Sons, MPA, Elveria Rising, BA, ACSM CEP, Exercise Physiologist;Laureen Owens Shark, BS, RRT, CPFT    Virtual Visit No    Medication changes reported     No    Fall or balance concerns reported    No    Warm-up and Cool-down Performed on first and last piece of equipment    Resistance Training Performed Yes    VAD Patient? No    PAD/SET Patient? No      Pain Assessment   Currently in Pain? No/denies                Social History   Tobacco Use  Smoking Status Former  Smokeless Tobacco Never    Goals Met:  Independence with exercise equipment Exercise tolerated well No report of concerns or symptoms today Strength training completed today  Goals Unmet:  Not Applicable  Comments: Pt able to follow exercise prescription today without complaint.  Will continue to monitor for progression.    Dr. Emily Filbert is Medical Director for [place].  Dr. Ottie Glazier is Medical Director for [facility] Pulmonary Rehabilitation.

## 2020-10-01 ENCOUNTER — Encounter: Payer: Self-pay | Admitting: *Deleted

## 2020-10-01 DIAGNOSIS — J8410 Pulmonary fibrosis, unspecified: Secondary | ICD-10-CM

## 2020-10-01 NOTE — Progress Notes (Signed)
Pulmonary Individual Treatment Plan  Patient Details  Name: Susan Sosa MRN: [id number] Date of Birth: 1940-08-12 Referring Provider:   Flowsheet Row Pulmonary Rehab from 08/12/2020 in [facility] Cardiac and Pulmonary Rehab  Referring Provider Dr. Ottie Glazier MD       Initial Encounter Date:  Flowsheet Row Pulmonary Rehab from 08/12/2020 in [facility] Cardiac and Pulmonary Rehab  Date 08/12/20       Visit Diagnosis: Pulmonary fibrosis ([place])  Patient's Home Medications on Admission:  Current Outpatient Medications:    albuterol (PROVENTIL) (2.5 MG/3ML) 0.083% nebulizer solution, Inhale into the lungs., Disp: , Rfl:    ascorbic acid (VITAMIN C) 500 MG tablet, ascorbic acid (vitamin C) 500 mg tablet   1 tabletby oral route., Disp: , Rfl:    aspirin 81 MG chewable tablet, aspirin 81 mg chewable tablet   81 mg by oral route., Disp: , Rfl:    atorvastatin (LIPITOR) 40 MG tablet, Take 1 tablet by mouth daily., Disp: , Rfl:    calcium-vitamin D (OSCAL WITH D) 500-200 MG-UNIT TABS tablet, Take 1 tablet by mouth daily., Disp: , Rfl:    cyanocobalamin 1000 MCG tablet, cyanocobalamin (vit B-12) 1,000 mcg tablet   1000 ugs by oral route., Disp: , Rfl:    esomeprazole (NEXIUM) 40 MG capsule, Take 1 capsule by mouth at bedtime., Disp: , Rfl:    hydrALAZINE (APRESOLINE) 10 MG tablet, TAKE 1 TABLET BY MOUTH NIGHTLY AS NEEDED, Disp: , Rfl:    levalbuterol (XOPENEX) 1.25 MG/3ML nebulizer solution, Inhale into the lungs., Disp: , Rfl:    losartan (COZAAR) 50 MG tablet, 1 tab by mouth daily around 5pm, Disp: , Rfl:    magnesium oxide (MAG-OX) 400 MG tablet, magnesium oxide 400 mg (241.3 mg magnesium) tablet   400 mg twice a day by oral route., Disp: , Rfl:    multivitamin-iron-minerals-folic acid (CENTRUM) chewable tablet, Chew by mouth., Disp: , Rfl:    naproxen sodium (ALEVE) 220 MG tablet, naproxen sodium 220 mg tablet   440 mg twice a day by oral route., Disp: , Rfl:    Pirfenidone (ESBRIET) 267 MG CAPS,  Take by mouth., Disp: , Rfl:    prednisoLONE acetate (PRED FORTE) 1 % ophthalmic suspension, prednisolone acetate 1 % eye drops,suspension  INSTILL 1 DROP INTO LEFT EYE 3 TIMES A DAY, Disp: , Rfl:    triamcinolone cream (KENALOG) 0.1 %, APPLY TO AFFECTED AREA TWICE A DAY, Disp: , Rfl:    triamterene-hydrochlorothiazide (MAXZIDE) 75-50 MG tablet, TAKE 1 BY MOUTH EVERY DAY., Disp: , Rfl:    verapamil (CALAN-SR) 180 MG CR tablet, Take by mouth., Disp: , Rfl:    vitamin E 180 MG (400 UNITS) capsule, vitamin E 268 mg (400 unit) capsule   400 units by oral route., Disp: , Rfl:   Past Medical History: Past Medical History:  Diagnosis Date   Hypertension     Tobacco Use: Social History   Tobacco Use  Smoking Status Former  Smokeless Tobacco Never    Labs: Recent Review Flowsheet Data   There is no flowsheet data to display.      Pulmonary Assessment Scores:  Pulmonary Assessment Scores     Row Name 08/12/20 1440         ADL UCSD   ADL Phase Entry     SOB Score total 23     Rest 0     Walk 1     Stairs 3     Bath 1  Dress 1     Shop 1           CAT Score   CAT Score 11           mMRC Score   mMRC Score 1              UCSD: Self-administered rating of dyspnea associated with activities of daily living (ADLs) 6-point scale (0 = "not at all" to 5 = "maximal or unable to do because of breathlessness")  Scoring Scores range from 0 to 120.  Minimally important difference is 5 units  CAT: CAT can identify the health impairment of COPD patients and is better correlated with disease progression.  CAT has a scoring range of zero to 40. The CAT score is classified into four groups of low (less than 10), medium (10 - 20), high (21-30) and very high (31-40) based on the impact level of disease on health status. A CAT score over 10 suggests significant symptoms.  A worsening CAT score could be explained by an exacerbation, poor medication adherence, poor inhaler  technique, or progression of COPD or comorbid conditions.  CAT MCID is 2 points  mMRC: mMRC (Modified Medical Research Council) Dyspnea Scale is used to assess the degree of baseline functional disability in patients of respiratory disease due to dyspnea. No minimal important difference is established. A decrease in score of 1 point or greater is considered a positive change.   Pulmonary Function Assessment:   Exercise Target Goals: Exercise Program Goal: Individual exercise prescription set using results from initial 6 min walk test and THRR while considering  patient's activity barriers and safety.   Exercise Prescription Goal: Initial exercise prescription builds to 30-45 minutes a day of aerobic activity, 2-3 days per week.  Home exercise guidelines will be given to patient during program as part of exercise prescription that the participant will acknowledge.  Education: Aerobic Exercise: - Group verbal and visual presentation on the components of exercise prescription. Introduces F.I.T.T principle from ACSM for exercise prescriptions.  Reviews F.I.T.T. principles of aerobic exercise including progression. Written material given at graduation. Flowsheet Row Pulmonary Rehab from 09/25/2020 in [facility] Cardiac and Pulmonary Rehab  Date 09/18/20  Educator [facility]  Instruction Review Code 1- Verbalizes Understanding       Education: Resistance Exercise: - Group verbal and visual presentation on the components of exercise prescription. Introduces F.I.T.T principle from ACSM for exercise prescriptions  Reviews F.I.T.T. principles of resistance exercise including progression. Written material given at graduation. Flowsheet Row Pulmonary Rehab from 09/25/2020 in [facility] Cardiac and Pulmonary Rehab  Date 09/25/20  Educator [facility]  Instruction Review Code 1- Verbalizes Understanding        Education: Exercise & Equipment Safety: - Individual verbal instruction and demonstration of equipment use and  safety with use of the equipment. Flowsheet Row Pulmonary Rehab from 09/25/2020 in [facility] Cardiac and Pulmonary Rehab  Education need identified 08/12/20  Date 08/12/20  Educator [place]  Instruction Review Code 1- Verbalizes Understanding       Education: Exercise Physiology & General Exercise Guidelines: - Group verbal and written instruction with models to review the exercise physiology of the cardiovascular system and associated critical values. Provides general exercise guidelines with specific guidelines to those with heart or lung disease.    Education: Flexibility, Balance, Mind/Body Relaxation: - Group verbal and visual presentation with interactive activity on the components of exercise prescription. Introduces F.I.T.T principle from ACSM for exercise prescriptions. Reviews F.I.T.T. principles of flexibility and balance  exercise training including progression. Also discusses the mind body connection.  Reviews various relaxation techniques to help reduce and manage stress (i.e. Deep breathing, progressive muscle relaxation, and visualization). Balance handout provided to take home. Written material given at graduation.   Activity Barriers & Risk Stratification:  Activity Barriers & Cardiac Risk Stratification - 08/12/20 1158       Activity Barriers & Cardiac Risk Stratification   Activity Barriers Shortness of Breath;Joint Problems;Deconditioning;Back Problems;Muscular Weakness;Other (comment)    Comments Right knee- gets cortisone shots             6 Minute Walk:  6 Minute Walk     Row Name 08/12/20 1158         6 Minute Walk   Phase Initial     Distance 500 feet     Walk Time 3.5 minutes  Stopped at 3:30 due to O2 sats     # of Rest Breaks 0     MPH 1.62     METS 1.08     RPE 12     Perceived Dyspnea  2     VO2 Peak 3.78     Symptoms No     Resting HR 67 bpm     Resting BP 130/62     Resting Oxygen Saturation  97 %     Exercise Oxygen Saturation  during 6 min  walk 79 %     Max Ex. HR 111 bpm     Max Ex. BP 162/68     2 Minute Post BP 134/68           Interval HR   1 Minute HR 87     2 Minute HR 101     3 Minute HR 111  Stopped at 3:30 due to O2 < 80%     Interval Heart Rate? Yes           Interval Oxygen   Interval Oxygen? Yes     Baseline Oxygen Saturation % 97 %     1 Minute Oxygen Saturation % 93 %     1 Minute Liters of Oxygen 2 L     2 Minute Oxygen Saturation % 87 %     2 Minute Liters of Oxygen 2 L     3 Minute Oxygen Saturation % 79 %     3 Minute Liters of Oxygen 2 L     4 Minute Oxygen Saturation % --  stopped     5 Minute Oxygen Saturation % --  stopped     6 Minute Oxygen Saturation % --  stopped     2 Minute Post Oxygen Saturation % 97 %     2 Minute Post Liters of Oxygen 3 L             Oxygen Initial Assessment:  Oxygen Initial Assessment - 08/12/20 1438       Home Oxygen   Home Oxygen Device Home Concentrator;Portable Concentrator    Sleep Oxygen Prescription Continuous    Liters per minute 2    Home Exercise Oxygen Prescription Continuous    Liters per minute 2    Home Resting Oxygen Prescription Continuous   Portable- pulsed   Liters per minute 2    Compliance with Home Oxygen Use Yes      Initial 6 min Walk   Oxygen Used Continuous    Liters per minute 2      Program Oxygen Prescription   Program Oxygen Prescription  Continuous    Liters per minute 2      Intervention   Short Term Goals To learn and exhibit compliance with exercise, home and travel O2 prescription;To learn and understand importance of monitoring SPO2 with pulse oximeter and demonstrate accurate use of the pulse oximeter.;To learn and understand importance of maintaining oxygen saturations>88%;To learn and demonstrate proper pursed lip breathing techniques or other breathing techniques. ;To learn and demonstrate proper use of respiratory medications    Long  Term Goals Exhibits compliance with exercise, home  and travel O2  prescription;Verbalizes importance of monitoring SPO2 with pulse oximeter and return demonstration;Maintenance of O2 saturations>88%;Exhibits proper breathing techniques, such as pursed lip breathing or other method taught during program session;Compliance with respiratory medication;Demonstrates proper use of MDI's             Oxygen Re-Evaluation:  Oxygen Re-Evaluation     Row Name 08/14/20 0947 09/16/20 1021           Program Oxygen Prescription   Program Oxygen Prescription Continuous Continuous      Liters per minute 2 2             Home Oxygen   Home Oxygen Device Home Concentrator;Portable Concentrator Home Concentrator;Portable Concentrator      Sleep Oxygen Prescription Continuous Continuous      Liters per minute 2 2      Home Exercise Oxygen Prescription Continuous --      Liters per minute 2 --      Home Resting Oxygen Prescription Continuous Continuous      Liters per minute 2 --      Compliance with Home Oxygen Use Yes Yes             Goals/Expected Outcomes   Short Term Goals To learn and demonstrate proper pursed lip breathing techniques or other breathing techniques.  To learn and demonstrate proper pursed lip breathing techniques or other breathing techniques.       Long  Term Goals Exhibits proper breathing techniques, such as pursed lip breathing or other method taught during program session Exhibits proper breathing techniques, such as pursed lip breathing or other method taught during program session      Comments Reviewed PLB technique with pt.  Talked about how it works and it's importance in maintaining their exercise saturations.      Short: Become more profiecient at using PLB.   Long: Become independent at using PLB. Susan Sosa is still utilizing PLB when needed. She checks her O2 at home- she admits only 1 time only dropping to 88%.      Goals/Expected Outcomes Short: Become more profiecient at using PLB.   Long: Become independent at using PLB. --                Oxygen Discharge (Final Oxygen Re-Evaluation):  Oxygen Re-Evaluation - 09/16/20 1021       Program Oxygen Prescription   Program Oxygen Prescription Continuous    Liters per minute 2      Home Oxygen   Home Oxygen Device Home Concentrator;Portable Concentrator    Sleep Oxygen Prescription Continuous    Liters per minute 2    Home Resting Oxygen Prescription Continuous    Compliance with Home Oxygen Use Yes      Goals/Expected Outcomes   Short Term Goals To learn and demonstrate proper pursed lip breathing techniques or other breathing techniques.     Long  Term Goals Exhibits proper breathing techniques, such as pursed lip  breathing or other method taught during program session    Comments Susan Sosa is still utilizing PLB when needed. She checks her O2 at home- she admits only 1 time only dropping to 88%.             Initial Exercise Prescription:  Initial Exercise Prescription - 08/12/20 1500       Date of Initial Exercise RX and Referring Provider   Date 08/12/20    Referring Provider Dr. Lanney Gins, Luvenia Heller MD      Treadmill   MPH 1.5    Grade 0    Minutes 15    METs 2.15      Recumbant Bike   Level 1    RPM 60    Watts 10    Minutes 15    METs 1.08      NuStep   Level 1    SPM 80    Minutes 15    METs 1.08      T5 Nustep   Level 1    SPM 80    Minutes 15    METs 1.08      Prescription Details   Frequency (times per week) 2    Duration Progress to 30 minutes of continuous aerobic without signs/symptoms of physical distress      Intensity   THRR 40-80% of Max Heartrate 95-124    Ratings of Perceived Exertion 11-13    Perceived Dyspnea 0-4      Progression   Progression Continue to progress workloads to maintain intensity without signs/symptoms of physical distress.      Resistance Training   Training Prescription Yes    Weight 3 lb    Reps 10-15             Perform Capillary Blood Glucose checks as needed.  Exercise  Prescription Changes:   Exercise Prescription Changes     Row Name 08/12/20 1500 08/27/20 1100 09/11/20 0900 09/23/20 1400       Response to Exercise   Blood Pressure (Admit) 130/62 128/82 118/64 136/74    Blood Pressure (Exercise) 162/68 132/74 132/62 --    Blood Pressure (Exit) 134/66 122/60 104/64 104/58    Heart Rate (Admit) 67 bpm 71 bpm 74 bpm 75 bpm    Heart Rate (Exercise) 111 bpm 99 bpm 101 bpm 101 bpm    Heart Rate (Exit) 72 bpm 82 bpm 85 bpm 86 bpm    Oxygen Saturation (Admit) 97 % 96 % 92 % 98 %    Oxygen Saturation (Exercise) 79 % 94 % 88 % 93 %    Oxygen Saturation (Exit) 97 % 98 % -- 99 %    Rating of Perceived Exertion (Exercise) '12 12 13 13    ' Perceived Dyspnea (Exercise) 2 1 0 2    Symptoms none none none SOB    Comments walk test results -- -- --    Duration -- Progress to 30 minutes of  aerobic without signs/symptoms of physical distress Progress to 30 minutes of  aerobic without signs/symptoms of physical distress Continue with 30 min of aerobic exercise without signs/symptoms of physical distress.    Intensity -- THRR unchanged THRR unchanged THRR unchanged         Progression   Progression -- Continue to progress workloads to maintain intensity without signs/symptoms of physical distress. Continue to progress workloads to maintain intensity without signs/symptoms of physical distress. Continue to progress workloads to maintain intensity without signs/symptoms of physical distress.  Average METs -- 1.8 2.06 2.3         Resistance Training   Training Prescription -- Yes Yes Yes    Weight -- 3 lb 3 lb 3 lb    Reps -- 10-15 10-15 10-15         Interval Training   Interval Training -- No No No         Treadmill   MPH -- 1.5 1.7 1.8    Grade -- 0 2.5 1.5    Minutes -- '15 15 15    ' METs -- 2.15 2.89 2.75         NuStep   Level -- 1 3 --    Minutes -- 15 15 --    METs -- 1.8 1.7 --         T5 Nustep   Level -- -- -- 3    Minutes -- -- -- 15     METs -- -- -- 1.7         Home Exercise Plan   Plans to continue exercise at -- -- [employer] (comment)  walking at the mall; [employer] (comment)  walking at the mall; staff videos    Frequency -- -- Add 2 additional days to program exercise sessions. Add 2 additional days to program exercise sessions.    Initial Home Exercises Provided -- -- 09/04/20 09/04/20             Exercise Comments:   Exercise Comments     Row Name 08/14/20 0946           Exercise Comments First full day of exercise!  Patient was oriented to gym and equipment including functions, settings, policies, and procedures.  Patient's individual exercise prescription and treatment plan were reviewed.  All starting workloads were established based on the results of the 6 minute walk test done at initial orientation visit.  The plan for exercise progression was also introduced and progression will be customized based on patient's performance and goals.                Exercise Goals and Review:   Exercise Goals     Row Name 08/12/20 1509             Exercise Goals   Increase Physical Activity Yes       Intervention Provide advice, education, support and counseling about physical activity/exercise needs.;Develop an individualized exercise prescription for aerobic and resistive training based on initial evaluation findings, risk stratification, comorbidities and participant's personal goals.       Expected Outcomes Long Term: Add in home exercise to make exercise part of routine and to increase amount of physical activity.;Short Term: Attend rehab on a regular basis to increase amount of physical activity.;Long Term: Exercising regularly at least 3-5 days a week.       Increase Strength and Stamina Yes       Intervention Provide advice, education, support and counseling about physical activity/exercise needs.;Develop an individualized exercise prescription for aerobic and  resistive training based on initial evaluation findings, risk stratification, comorbidities and participant's personal goals.       Expected Outcomes Short Term: Increase workloads from initial exercise prescription for resistance, speed, and METs.;Short Term: Perform resistance training exercises routinely during rehab and add in resistance training at home;Long Term: Improve cardiorespiratory fitness, muscular endurance and strength as measured by increased METs and functional capacity (6MWT)       Able to understand and use  rate of perceived exertion (RPE) scale Yes       Intervention Provide education and explanation on how to use RPE scale       Expected Outcomes Short Term: Able to use RPE daily in rehab to express subjective intensity level;Long Term:  Able to use RPE to guide intensity level when exercising independently       Able to understand and use Dyspnea scale Yes       Intervention Provide education and explanation on how to use Dyspnea scale       Expected Outcomes Short Term: Able to use Dyspnea scale daily in rehab to express subjective sense of shortness of breath during exertion;Long Term: Able to use Dyspnea scale to guide intensity level when exercising independently       Knowledge and understanding of Target Heart Rate Range (THRR) Yes       Intervention Provide education and explanation of THRR including how the numbers were predicted and where they are located for reference       Expected Outcomes Short Term: Able to state/look up THRR;Long Term: Able to use THRR to govern intensity when exercising independently;Short Term: Able to use daily as guideline for intensity in rehab       Able to check pulse independently Yes       Intervention Provide education and demonstration on how to check pulse in carotid and radial arteries.;Review the importance of being able to check your own pulse for safety during independent exercise       Expected Outcomes Short Term: Able to explain  why pulse checking is important during independent exercise;Long Term: Able to check pulse independently and accurately       Understanding of Exercise Prescription Yes       Intervention Provide education, explanation, and written materials on patient's individual exercise prescription       Expected Outcomes Short Term: Able to explain program exercise prescription;Long Term: Able to explain home exercise prescription to exercise independently                Exercise Goals Re-Evaluation :  Exercise Goals Re-Evaluation     Row Name 08/14/20 0946 08/27/20 1132 09/04/20 1016 09/11/20 0916 09/23/20 1439     Exercise Goal Re-Evaluation   Exercise Goals Review Understanding of Exercise Prescription;Able to check pulse independently;Knowledge and understanding of Target Heart Rate Range (THRR);Able to understand and use Dyspnea scale;Able to understand and use rate of perceived exertion (RPE) scale Increase Physical Activity;Increase Strength and Stamina Increase Physical Activity;Increase Strength and Stamina;Understanding of Exercise Prescription Increase Physical Activity;Increase Strength and Stamina;Understanding of Exercise Prescription Increase Physical Activity;Increase Strength and Stamina   Comments Reviewed RPE and dyspnea scales, THR and program prescription with pt today.  Pt voiced understanding and was given a copy of goals to take home. Lamaya is doing well in her first sessions.  Lowest oxygen is 93% during exercise.  Staff will monitor progress. Reviewed home exercise with pt today.  Pt plans to walk for exercise. She already walks the mall about 2-3x/ week. She is thinking of joining the [employer]. Reviewed THR, pulse, RPE, sign and symptoms, pulse oximetery and when to call 911 or MD.  Also discussed weather considerations and indoor options.  Pt voiced understanding. Susan Sosa is doing well in rehab. She has increased her treadmill to 1.7 speed and 2.5% for the incline and is already to  level 3 on the Nustep. Will continue to monitor progression. Susan Sosa continues to do well.  She attends consistently and sats stay in the 90s.  Staff will encourage her to try 4 lb weights.   Expected Outcomes Short: Use RPE daily to regulate intensity. Long: Follow program prescription in THR. Short:  attend consistently Long:  build overall stamina Short: Check HR and O2 during exercise Long: Continue to exercise independently at appropriate prescription Short: Continue to increase load on treadmill as tolerated Long: Continue to increase overall MET level Short: try 4 lb Long :  build overall stamina            Discharge Exercise Prescription (Final Exercise Prescription Changes):  Exercise Prescription Changes - 09/23/20 1400       Response to Exercise   Blood Pressure (Admit) 136/74    Blood Pressure (Exit) 104/58    Heart Rate (Admit) 75 bpm    Heart Rate (Exercise) 101 bpm    Heart Rate (Exit) 86 bpm    Oxygen Saturation (Admit) 98 %    Oxygen Saturation (Exercise) 93 %    Oxygen Saturation (Exit) 99 %    Rating of Perceived Exertion (Exercise) 13    Perceived Dyspnea (Exercise) 2    Symptoms SOB    Duration Continue with 30 min of aerobic exercise without signs/symptoms of physical distress.    Intensity THRR unchanged      Progression   Progression Continue to progress workloads to maintain intensity without signs/symptoms of physical distress.    Average METs 2.3      Resistance Training   Training Prescription Yes    Weight 3 lb    Reps 10-15      Interval Training   Interval Training No      Treadmill   MPH 1.8    Grade 1.5    Minutes 15    METs 2.75      T5 Nustep   Level 3    Minutes 15    METs 1.7      Home Exercise Plan   Plans to continue exercise at [employer] (comment)   walking at the mall; staff videos   Frequency Add 2 additional days to program exercise sessions.    Initial Home Exercises Provided 09/04/20             Nutrition:   Target Goals: Understanding of nutrition guidelines, daily intake of sodium <1544m, cholesterol <2036m calories 30% from fat and 7% or less from saturated fats, daily to have 5 or more servings of fruits and vegetables.  Education: All About Nutrition: -Group instruction provided by verbal, written material, interactive activities, discussions, models, and posters to present general guidelines for heart healthy nutrition including fat, fiber, MyPlate, the role of sodium in heart healthy nutrition, utilization of the nutrition label, and utilization of this knowledge for meal planning. Follow up email sent as well. Written material given at graduation. Flowsheet [facility]  Date 08/19/20  [facility] 1- Verbalizes Understanding       Biometrics:  Pre Biometrics - 08/12/20 1157       Pre Biometrics   Height 5' (1.524 m)    Weight 145 lb 11.2 oz (66.1 kg)    BMI (Calculated) 28.46    Single Leg Stand 10.03 seconds              Nutrition Therapy Plan and Nutrition Goals:  Nutrition Therapy & Goals - 08/26/20 0920       Nutrition Therapy  Diet Heart healthy, low Na, pulmonary MNT    Drug/Food Interactions Statins/Certain Fruits    Protein (specify units) 80g    Fiber 25 grams    Whole Grain Foods 3 servings    Saturated Fats 12 max. grams    Fruits and Vegetables 8 servings/day    Sodium 1.5 grams      Personal Nutrition Goals   Nutrition Goal ST: try soymilk, add avocado or peanut butter to lunch, swap out protein shake (Ensure, Boost, Orgain) LT: maintain weight    Comments B: cereal (frosted flakes and mini wheats and almond milk) or sausage biscuit S: premier protein L: not hungry - she thinks this is due to her medication. 1-2 eggs and bread D: meat (chicken, steak, limits her pork intake (she feels she is allergic) - grilled)and some vegetables (peas, pinto beans, beets, corn, small  amounts of potato - if she does it is a sweet potato, spinach, turnip greens - little grapeseed oil) S: apple. Drinks: water.  She will cook beans from dried. Does not cook with salt. She has a lower appetite - has been for years. She has lost 30 lbs over 3-4 months (2 years ago) - gained 10 pounds back. She drinks premier protein which has recently been recalled - provided information on lot numbers and encourgaed her to check her products - suggested swapping and trying Ensure, Orgain, or Boost. suggested swapping almond milk for fortified soymilk and adding avocados or peanut butter to her lunch. Discussed heart healthy eating and pulmonary friendly eating.      Intervention Plan   Intervention Prescribe, educate and counsel regarding individualized specific dietary modifications aiming towards targeted core components such as weight, hypertension, lipid management, diabetes, heart failure and other comorbidities.;Nutrition handout(s) given to patient.    Expected Outcomes Short Term Goal: Understand basic principles of dietary content, such as calories, fat, sodium, cholesterol and nutrients.;Short Term Goal: A plan has been developed with personal nutrition goals set during dietitian appointment.;Long Term Goal: Adherence to prescribed nutrition plan.             Nutrition Assessments:  MEDIFICTS Score Key: ?70 Need to make dietary changes  40-70 Heart Healthy Diet ? 40 Therapeutic Level Cholesterol Diet  Flowsheet Row Pulmonary Rehab from 08/12/2020 in [facility] Cardiac and Pulmonary Rehab  Picture Your Plate Total Score on Admission 66      Picture Your Plate Scores: <96 Unhealthy dietary pattern with much room for improvement. 41-50 Dietary pattern unlikely to meet recommendations for good health and room for improvement. 51-60 More healthful dietary pattern, with some room for improvement.  >60 Healthy dietary pattern, although there may be some specific behaviors that could be  improved.   Nutrition Goals Re-Evaluation:  Nutrition Goals Re-Evaluation     [place] Name 09/16/20 1028             Goals   Nutrition Goal ST: try soymilk, add avocado or peanut butter to lunch, swap out protein shake (Ensure, Boost, Orgain) LT: maintain weight       Comment Susan Sosa is doing well with her changes. Susan Sosa has been drinking Atkins shake once per day to help increase her protein intake. She has been adding peanut butter to her crackers as well as another change.       Expected Outcome Short: Continue to follow RD goals Long: Continue to eat a balanced- pulmonary based diet  Nutrition Goals Discharge (Final Nutrition Goals Re-Evaluation):  Nutrition Goals Re-Evaluation - 09/16/20 1028       Goals   Nutrition Goal ST: try soymilk, add avocado or peanut butter to lunch, swap out protein shake (Ensure, Boost, Orgain) LT: maintain weight    Comment Susan Sosa is doing well with her changes. Susan Sosa has been drinking Atkins shake once per day to help increase her protein intake. She has been adding peanut butter to her crackers as well as another change.    Expected Outcome Short: Continue to follow RD goals Long: Continue to eat a balanced- pulmonary based diet             Psychosocial: Target Goals: Acknowledge presence or absence of significant depression and/or stress, maximize coping skills, provide positive support system. Participant is able to verbalize types and ability to use techniques and skills needed for reducing stress and depression.   Education: Stress, Anxiety, and Depression - Group verbal and visual presentation to define topics covered.  Reviews how body is impacted by stress, anxiety, and depression.  Also discusses healthy ways to reduce stress and to treat/manage anxiety and depression.  Written material given at graduation. Flowsheet Row Pulmonary Rehab from 09/25/2020 in [facility] Cardiac and Pulmonary Rehab  Date 09/04/20  Educator [facility]  Instruction  Review Code 1- [employer] Understanding       Education: Sleep Hygiene -Provides group verbal and written instruction about how sleep can affect your health.  Define sleep hygiene, discuss sleep cycles and impact of sleep habits. Review good sleep hygiene tips.    Initial Review & Psychosocial Screening:  Initial Psych Review & Screening - 08/04/20 1545       Initial Review   Current issues with Current Sleep Concerns      Family Dynamics   Good Support System? Yes   husband     Barriers   Psychosocial barriers to participate in program There are no identifiable barriers or psychosocial needs.;The patient should benefit from training in stress management and relaxation.      Screening Interventions   Interventions Encouraged to exercise;Provide feedback about the scores to participant;To provide support and resources with identified psychosocial needs    Expected Outcomes Short Term goal: Utilizing psychosocial counselor, staff and physician to assist with identification of specific Stressors or current issues interfering with healing process. Setting desired goal for each stressor or current issue identified.;Long Term Goal: Stressors or current issues are controlled or eliminated.;Short Term goal: Identification and review with participant of any Quality of Life or Depression concerns found by scoring the questionnaire.;Long Term goal: The participant improves quality of Life and PHQ9 Scores as seen by post scores and/or verbalization of changes             Quality of Life Scores:  Scores of 19 and below usually indicate a poorer quality of life in these areas.  A difference of  2-3 points is a clinically meaningful difference.  A difference of 2-3 points in the total score of the Quality of Life Index has been associated with significant improvement in overall quality of life, self-image, physical symptoms, and general health in studies assessing change in quality of  life.  PHQ-9: Recent Review Flowsheet Data     Depression screen [facility] 2/9 08/12/2020   Decreased Interest 0   Down, Depressed, Hopeless 0   PHQ - 2 Score 0   Altered sleeping 0   Tired, decreased energy 1   Change in appetite 0  Feeling bad or failure about yourself  0   Trouble concentrating 0   Moving slowly or fidgety/restless 0   Suicidal thoughts 0   PHQ-9 Score 1   Difficult doing work/chores Not difficult at all      Interpretation of Total Score  Total Score Depression Severity:  1-4 = Minimal depression, 5-9 = Mild depression, 10-14 = Moderate depression, 15-19 = Moderately severe depression, 20-27 = Severe depression   Psychosocial Evaluation and Intervention:  Psychosocial Evaluation - 08/04/20 1546       Psychosocial Evaluation & Interventions   Interventions Encouraged to exercise with the program and follow exercise prescription    Comments Susan 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 She has been dealing with this diagnosis for the past two years, but is proud that the doctor said she seems to be holding her own. She reports that she already has lost 30% of her lung capacity and is working hard to maintain her current lung function which is why she is motivated to come to rehab. She walks 2-3 times a week at [facility] and is diligent about her breathing exercises and medications. She wears oxygen as well. Her husband is her main support system. She does not report any stressors currently as she feels like she is handling her current health status appropriately. She has noticed in the last 6 months that her sleep has been altered, where she used to fall asleep quickly now it is taking her over an hour to fall back asleep after waking to use the bathroom.    Expected Outcomes Short: attend pulmonary rehab for education and exercise. Long: develop and maintain positive self care habits.    Continue Psychosocial Services  Follow up required by  staff             Psychosocial Re-Evaluation:  Psychosocial Re-Evaluation     Susan Sosa Name 09/16/20 1027             Psychosocial Re-Evaluation   Current issues with Current Sleep Concerns       Comments Susan Sosa is holding up well mentally. Her sleep isnt the best when she gets up to use the restroom throughout the night. She does not have any desire to ask her doctor for any sleep aide. She does have good support from family and husband specifically       Expected Outcomes Short: Continue attendance with rehab Long: Maintain positive attitude       Interventions Encouraged to attend Cardiac Rehabilitation for the exercise       Continue Psychosocial Services  Follow up required by staff                Psychosocial Discharge (Final Psychosocial Re-Evaluation):  Psychosocial Re-Evaluation - 09/16/20 1027       Psychosocial Re-Evaluation   Current issues with Current Sleep Concerns    Comments Susan Sosa is holding up well mentally. Her sleep isnt the best when she gets up to use the restroom throughout the night. She does not have any desire to ask her doctor for any sleep aide. She does have good support from family and husband specifically    Expected Outcomes Short: Continue attendance with rehab Long: Maintain positive attitude    Interventions Encouraged to attend Cardiac Rehabilitation for the exercise    Continue Psychosocial Services  Follow up required by staff             Education: Education Goals: Education classes will  be provided on a weekly basis, covering required topics. Participant will state understanding/return demonstration of topics presented.  Learning Barriers/Preferences:   General Pulmonary Education Topics:  Infection Prevention: - Provides verbal and written material to individual with discussion of infection control including proper hand washing and proper equipment cleaning during exercise session. Flowsheet Row Pulmonary Rehab from 09/25/2020 in  [facility] Cardiac and Pulmonary Rehab  Education need identified 08/12/20  Date 08/12/20  Educator [place]  Instruction Review Code 1- Verbalizes Understanding       Falls Prevention: - Provides verbal and written material to individual with discussion of falls prevention and safety. Flowsheet Row Pulmonary Rehab from 09/25/2020 in [facility] Cardiac and Pulmonary Rehab  Education need identified 08/12/20  Date 08/12/20  Educator [place]  Instruction Review Code 1- Verbalizes Understanding       Chronic Lung Disease Review: - Group verbal instruction with posters, models, PowerPoint presentations and videos,  to review new updates, new respiratory medications, new advancements in procedures and treatments. Providing information on websites and "800" numbers for continued self-education. Includes information about supplement oxygen, available portable oxygen systems, continuous and intermittent flow rates, oxygen safety, concentrators, and Medicare reimbursement for oxygen. Explanation of Pulmonary Drugs, including class, frequency, complications, importance of spacers, rinsing mouth after steroid MDI's, and proper cleaning methods for nebulizers. Review of basic lung anatomy and physiology related to function, structure, and complications of lung disease. Review of risk factors. Discussion about methods for diagnosing sleep apnea and types of masks and machines for OSA. Includes a review of the use of types of environmental controls: home humidity, furnaces, filters, dust mite/pet prevention, HEPA vacuums. Discussion about weather changes, air quality and the benefits of nasal washing. Instruction on Warning signs, infection symptoms, calling MD promptly, preventive modes, and value of vaccinations. Review of effective airway clearance, coughing and/or vibration techniques. Emphasizing that all should Create an Action Plan. Written material given at graduation. Flowsheet Row Pulmonary Rehab from 09/25/2020 in [facility]  Cardiac and Pulmonary Rehab  Date 08/28/20  Educator [facility]  Instruction Review Code 1- Verbalizes Understanding       AED/CPR: - Group verbal and written instruction with the use of models to demonstrate the basic use of the AED with the basic ABC's of resuscitation.    Anatomy and Cardiac Procedures: - Group verbal and visual presentation and models provide information about basic cardiac anatomy and function. Reviews the testing methods done to diagnose heart disease and the outcomes of the test results. Describes the treatment choices: Medical Management, Angioplasty, or Coronary Bypass Surgery for treating various heart conditions including Myocardial Infarction, Angina, Valve Disease, and Cardiac Arrhythmias.  Written material given at graduation. Flowsheet Row Pulmonary Rehab from 09/25/2020 in [facility] Cardiac and Pulmonary Rehab  Date 09/25/20  Educator [place]  Instruction Review Code 1- Verbalizes Understanding       Medication Safety: - Group verbal and visual instruction to review commonly prescribed medications for heart and lung disease. Reviews the medication, class of the drug, and side effects. Includes the steps to properly store meds and maintain the prescription regimen.  Written material given at graduation. Flowsheet Row Pulmonary Rehab from 09/25/2020 in [facility] Cardiac and Pulmonary Rehab  Date 08/14/20  Educator [facility]  Instruction Review Code 1- Verbalizes Understanding       Other: -Provides group and verbal instruction on various topics (see comments)   Knowledge Questionnaire Score:  Knowledge Questionnaire Score - 08/12/20 1443       Knowledge Questionnaire Score  Pre Score 15/18: Oxygen saturation, Albuterol, Oxygen flame              Core Components/Risk Factors/Patient Goals at Admission:  Personal Goals and Risk Factors at Admission - 08/12/20 1508       Core Components/Risk Factors/Patient Goals on Admission    Weight Management Yes;Weight  Maintenance    Intervention Weight Management: Develop a combined nutrition and exercise program designed to reach desired caloric intake, while maintaining appropriate intake of nutrient and fiber, sodium and fats, and appropriate energy expenditure required for the weight goal.;Weight Management: Provide education and appropriate resources to help participant work on and attain dietary goals.;Weight Management/Obesity: Establish reasonable short term and long term weight goals.    Admit Weight 145 lb (65.8 kg)    Goal Weight: Short Term 145 lb (65.8 kg)    Goal Weight: Long Term 145 lb (65.8 kg)    Expected Outcomes Short Term: Continue to assess and modify interventions until short term weight is achieved;Weight Maintenance: Understanding of the daily nutrition guidelines, which includes 25-35% calories from fat, 7% or less cal from saturated fats, less than 222m cholesterol, less than 1.5gm of sodium, & 5 or more servings of fruits and vegetables daily;Understanding recommendations for meals to include 15-35% energy as protein, 25-35% energy from fat, 35-60% energy from carbohydrates, less than 2064mof dietary cholesterol, 20-35 gm of total fiber daily;Understanding of distribution of calorie intake throughout the day with the consumption of 4-5 meals/snacks    Improve shortness of breath with ADL's Yes    Intervention Provide education, individualized exercise plan and daily activity instruction to help decrease symptoms of SOB with activities of daily living.    Expected Outcomes Short Term: Improve cardiorespiratory fitness to achieve a reduction of symptoms when performing ADLs;Long Term: Be able to perform more ADLs without symptoms or delay the onset of symptoms    Hypertension Yes    Intervention Provide education on lifestyle modifcations including regular physical activity/exercise, weight management, moderate sodium restriction and increased consumption of fresh fruit, vegetables, and low  fat dairy, alcohol moderation, and smoking cessation.;Monitor prescription use compliance.    Expected Outcomes Short Term: Continued assessment and intervention until BP is < 140/9079mG in hypertensive participants. < 130/44m57m in hypertensive participants with diabetes, heart failure or chronic kidney disease.;Long Term: Maintenance of blood pressure at goal levels.    Lipids Yes    Intervention Provide education and support for participant on nutrition & aerobic/resistive exercise along with prescribed medications to achieve LDL <70mg50mL >40mg.33mExpected Outcomes Short Term: Participant states understanding of desired cholesterol values and is compliant with medications prescribed. Participant is following exercise prescription and nutrition guidelines.;Long Term: Cholesterol controlled with medications as prescribed, with individualized exercise RX and with personalized nutrition plan. Value goals: LDL < 70mg, 32m> 40 mg.             Education:Diabetes - Individual verbal and written instruction to review signs/symptoms of diabetes, desired ranges of glucose level fasting, after meals and with exercise. Acknowledge that pre and post exercise glucose checks will be done for 3 sessions at entry of program.   Know Your Numbers and Heart Failure: - Group verbal and visual instruction to discuss disease risk factors for cardiac and pulmonary disease and treatment options.  Reviews associated critical values for Overweight/Obesity, Hypertension, Cholesterol, and Diabetes.  Discusses basics of heart failure: signs/symptoms and treatments.  Introduces Heart Failure Zone chart for action plan for  heart failure.  Written material given at graduation. Flowsheet Row Pulmonary Rehab from 09/25/2020 in [facility] Cardiac and Pulmonary Rehab  Date 08/21/20  Educator [place]  Instruction Review Code 1- Verbalizes Understanding       Core Components/Risk Factors/Patient Goals Review:   Goals and Risk Factor  Review     Row Name 09/16/20 1024             Core Components/Risk Factors/Patient Goals Review   Personal Goals Review Weight Management/Obesity;Hypertension;Improve shortness of breath with ADL's       Review Susan Sosa thinks she lost weight a little bit and will continue to check it at home and make note of any abnormal changes. Checks BP at home typically runs around 130s/70s and fairly stable at rehab. She is taking all medications as prescribed. Breathing still feels the same so far but she recently was diagnosed with COVID. She is hoping she will feel better the more she recovers.       Expected Outcomes Short: Continue to watch weight at home Long: Continue to manage lifestyle risk factors                Core Components/Risk Factors/Patient Goals at Discharge (Final Review):   Goals and Risk Factor Review - 09/16/20 1024       Core Components/Risk Factors/Patient Goals Review   Personal Goals Review Weight Management/Obesity;Hypertension;Improve shortness of breath with ADL's    Review Susan Sosa thinks she lost weight a little bit and will continue to check it at home and make note of any abnormal changes. Checks BP at home typically runs around 130s/70s and fairly stable at rehab. She is taking all medications as prescribed. Breathing still feels the same so far but she recently was diagnosed with COVID. She is hoping she will feel better the more she recovers.    Expected Outcomes Short: Continue to watch weight at home Long: Continue to manage lifestyle risk factors             ITP Comments:  ITP Comments     Row Name 08/04/20 1557 08/12/20 1141 08/14/20 0946 08/26/20 1014 09/03/20 0738   ITP Comments Initial telephone orientation completed. Diangosis can be found in [facility] 6/24. EP orientation scheduled for Tuesday 7/19 at 10am. Completed 6MWT and gym orientation. Initial ITP created and sent for review to Dr. Ottie Glazier, Medical Director. First full day of exercise!  Patient  was oriented to gym and equipment including functions, settings, policies, and procedures.  Patient's individual exercise prescription and treatment plan were reviewed.  All starting workloads were established based on the results of the 6 minute walk test done at initial orientation visit.  The plan for exercise progression was also introduced and progression will be customized based on patient's performance and goals. Completed initial RD evaluation 30 Day review completed. Medical Director ITP review done, changes made as directed, and signed approval by Medical Director.    [place] Name 09/11/20 0915 10/01/20 0638         ITP Comments Susan Sosa tested positive for COVID on 8/12 and will be out until cleared to come back 30 Day review completed. Medical Director ITP review done, changes made as directed, and signed approval by Medical Director.               Comments:

## 2020-10-02 ENCOUNTER — Other Ambulatory Visit: Payer: Self-pay

## 2020-10-02 ENCOUNTER — Encounter: Payer: Medicare Other | Admitting: *Deleted

## 2020-10-02 DIAGNOSIS — J8410 Pulmonary fibrosis, unspecified: Secondary | ICD-10-CM

## 2020-10-02 NOTE — Progress Notes (Signed)
Daily Session Note  Patient Details  Name: Susan Sosa MRN: [id number] Date of Birth: 1940-09-06 Referring Provider:   Flowsheet Row Pulmonary Rehab from 08/12/2020 in [facility] Cardiac and Pulmonary Rehab  Referring Provider Dr. Ottie Glazier MD       Encounter Date: 10/02/2020  Check In:  Session Check In - 10/02/20 1039       Check-In   Supervising physician immediately available to respond to emergencies See telemetry face sheet for immediately available ER MD    Location [facility]-Cardiac & Pulmonary Rehab    Staff Present Renita Papa, RN BSN;Laureen Owens Shark, BS, RRT, CPFT;Jessica Willamina, MA, RCEP, CCRP, CCET    Virtual Visit No    Medication changes reported     No    Fall or balance concerns reported    No    Warm-up and Cool-down Performed on first and last piece of equipment    Resistance Training Performed Yes    VAD Patient? No    PAD/SET Patient? No      Pain Assessment   Currently in Pain? No/denies                Social History   Tobacco Use  Smoking Status Former  Smokeless Tobacco Never    Goals Met:  Independence with exercise equipment Exercise tolerated well No report of concerns or symptoms today Strength training completed today  Goals Unmet:  Not Applicable  Comments: Pt able to follow exercise prescription today without complaint.  Will continue to monitor for progression.    Dr. Emily Filbert is Medical Director for [facility].  Dr. Ottie Glazier is Medical Director for [facility] Pulmonary Rehabilitation.

## 2020-10-07 ENCOUNTER — Other Ambulatory Visit: Payer: Self-pay

## 2020-10-07 DIAGNOSIS — J8410 Pulmonary fibrosis, unspecified: Secondary | ICD-10-CM | POA: Diagnosis not present

## 2020-10-07 NOTE — Progress Notes (Signed)
Daily Session Note  Patient Details  Name: Susan Sosa MRN: [id number] Date of Birth: 1940/03/05 Referring Provider:   Flowsheet Row Pulmonary Rehab from 08/12/2020 in [facility] Cardiac and Pulmonary Rehab  Referring Provider Dr. Ottie Glazier MD       Encounter Date: 10/07/2020  Check In:  Session Check In - 10/07/20 0956       Check-In   Supervising physician immediately available to respond to emergencies See telemetry face sheet for immediately available ER MD    Location [facility]-Cardiac & Pulmonary Rehab    Staff Present Birdie Sons, MPA, RN;Jessica Luan Pulling, MA, RCEP, CCRP, CCET;Amanda Sommer, BA, ACSM CEP, Exercise Physiologist    Virtual Visit No    Medication changes reported     No    Fall or balance concerns reported    No    Warm-up and Cool-down Performed on first and last piece of equipment    Resistance Training Performed Yes    VAD Patient? No    PAD/SET Patient? No      Pain Assessment   Currently in Pain? No/denies                Social History   Tobacco Use  Smoking Status Former  Smokeless Tobacco Never    Goals Met:  Independence with exercise equipment Exercise tolerated well No report of concerns or symptoms today Strength training completed today  Goals Unmet:  Not Applicable  Comments: Pt able to follow exercise prescription today without complaint.  Will continue to monitor for progression.    Dr. Emily Filbert is Medical Director for [place].  Dr. Ottie Glazier is Medical Director for [facility] Pulmonary Rehabilitation.

## 2020-10-09 ENCOUNTER — Other Ambulatory Visit: Payer: Self-pay

## 2020-10-09 DIAGNOSIS — J8410 Pulmonary fibrosis, unspecified: Secondary | ICD-10-CM | POA: Diagnosis not present

## 2020-10-09 NOTE — Progress Notes (Signed)
Daily Session Note  Patient Details  Name: Susan Sosa MRN: [id number] Date of Birth: 09/30/1940 Referring Provider:   Flowsheet Row Pulmonary Rehab from 08/12/2020 in [facility] Cardiac and Pulmonary Rehab  Referring Provider Dr. Aleskerov, Fuad MD       Encounter Date: 10/09/2020  Check In:  Session Check In - 10/09/20 0937       Check-In   Supervising physician immediately available to respond to emergencies See telemetry face sheet for immediately available ER MD    Location [facility] Pul[facility] Re[facility]    Staff Present Kelly Bollinger, MPA, RN;Amanda Sommer, BA, ACSM CEP, Exercise Physiologist;Kelly Hayes, BS, ACSM CEP, Exercise Physiologist    Virtual Visit No    Medication changes reported     No    Fall or balance concerns reported    No    Warm-up and Cool-down Performed on first and last piece of equipment    Resistance Training Performed Yes    VAD Patient? No    PAD/SET Patient? No      Pain Assessment   Currently in Pain? No/denies                Social History   Tobacco Use  Smoking Status Former  Smokeless Tobacco Never    Goals Met:  Independence with exercise equipment Exercise tolerated well No report of concerns or symptoms today Strength training completed today  Goals Unmet:  Not Applicable  Comments: Pt able to follow exercise prescription today without complaint.  Will continue to monitor for progression.    Dr. Mark Miller is Medical Director for [facility].  Dr. Fuad Aleskerov is Medical Director for [facility] Pulmonary Rehabilitation. 

## 2020-10-14 ENCOUNTER — Other Ambulatory Visit: Payer: Self-pay

## 2020-10-14 DIAGNOSIS — J8410 Pulmonary fibrosis, unspecified: Secondary | ICD-10-CM

## 2020-10-14 NOTE — Progress Notes (Signed)
Daily Session Note  Patient Details  Name: Susan Sosa MRN: [id number] Date of Birth: 1940-07-21 Referring Provider:   Flowsheet Row Pulmonary Rehab from 08/12/2020 in [facility] Cardiac and Pulmonary Rehab  Referring Provider Dr. Ottie Glazier MD       Encounter Date: 10/14/2020  Check In:  Session Check In - 10/14/20 1000       Check-In   Supervising physician immediately available to respond to emergencies See telemetry face sheet for immediately available ER MD    Location [facility]Card[facility] & Pulmonary Rehab    Staff Present Birdie Sons, MPA, RN;Amanda Sommer, BA, ACSM CEP, Exercise Physiologist;Jessica Luan Pulling, MA, RCEP, CCRP, CCET    Virtual Visit No    Medication changes reported     No    Fall or balance concerns reported    No    Warm-up and Cool-down Performed on first and last piece of equipment    Resistance Training Performed Yes    VAD Patient? No    PAD/SET Patient? No      Pain Assessment   Currently in Pain? No/denies                Social History   Tobacco Use  Smoking Status Former  Smokeless Tobacco Never    Goals Met:  Independence with exercise equipment Exercise tolerated well No report of concerns or symptoms today Strength training completed today  Goals Unmet:  Not Applicable  Comments: Pt able to follow exercise prescription today without complaint.  Will continue to monitor for progression.    Dr. Emily Filbert is Medical Director for [place].  Dr. Ottie Glazier is Medical Director for [facility] Pulmonary Rehabilitation.

## 2020-10-16 ENCOUNTER — Other Ambulatory Visit: Payer: Self-pay

## 2020-10-16 DIAGNOSIS — J8410 Pulmonary fibrosis, unspecified: Secondary | ICD-10-CM

## 2020-10-16 NOTE — Progress Notes (Signed)
Daily Session Note  Patient Details  Name: Sydelle Sherfield MRN: [id number] Date of Birth: 01-10-1941 Referring Provider:   Flowsheet Row Pulmonary Rehab from 08/12/2020 in [facility] Cardiac and Pulmonary Rehab  Referring Provider Dr. Ottie Glazier MD       Encounter Date: 10/16/2020  Check In:  Session Check In - 10/16/20 0937       Check-In   Supervising physician immediately available to respond to emergencies See telemetry face sheet for immediately available ER MD    Location [facility]Card[facility] & Pulmonary Re[facility]    Staff Present Birdie Sons, MPA, RN;Jessica [place], MA, RCEP, CCRP, CCET;Amanda Sommer, BA, ACSM CEP, Exercise Physiologist;Sheniece Ruggles Amedeo Plenty, BS, ACSM CEP, Exercise Physiologist    Virtual Visit No    Medication changes reported     No    Fall or balance concerns reported    No    Warm-up and Cool-down Performed on first and last piece of equipment    Resistance Training Performed Yes    VAD Patient? No    PAD/SET Patient? No      Pain Assessment   Currently in Pain? No/denies                Social History   Tobacco Use  Smoking Status Former  Smokeless Tobacco Never    Goals Met:  Independence with exercise equipment Exercise tolerated well Personal goals reviewed No report of concerns or symptoms today Strength training completed today  Goals Unmet:  Not Applicable  Comments: Pt able to follow exercise prescription today without complaint.  Will continue to monitor for progression.    Dr. Emily Filbert is Medical Director for [place].  Dr. Ottie Glazier is Medical Director for [facility] Pulmonary Rehabilitation.

## 2020-10-21 ENCOUNTER — Other Ambulatory Visit: Payer: Self-pay

## 2020-10-21 DIAGNOSIS — J8410 Pulmonary fibrosis, unspecified: Secondary | ICD-10-CM

## 2020-10-21 NOTE — Progress Notes (Signed)
Daily Session Note  Patient Details  Name: Susan Sosa MRN: [id number] Date of Birth: [birth year] Referring Provider:   Flowsheet Row Pulmonary Rehab from 08/12/2020 in [facility] Cardiac and Pulmonary Rehab  Referring Provider Dr. Ottie Glazier MD       Encounter Date: 10/21/2020  Check In:  Session Check In - 10/21/20 0945       Check-In   Supervising physician immediately available to respond to emergencies See telemetry face sheet for immediately available ER MD    Location [facility] Pul[facility] Re[facility]    Staff Present Birdie Sons, MPA, RN;Jessica Luan Pulling, MA, RCEP, CCRP, CCET;Amanda Sommer, BA, ACSM CEP, Exercise Physiologist    Virtual Visit No    Medication changes reported     No    Fall or balance concerns reported    No    Warm-up and Cool-down Performed on first and last piece of equipment    Resistance Training Performed Yes    VAD Patient? No    PAD/SET Patient? No      Pain Assessment   Currently in Pain? No/denies                Social History   Tobacco Use  Smoking Status Former  Smokeless Tobacco Never    Goals Met:  Independence with exercise equipment Exercise tolerated well No report of concerns or symptoms today Strength training completed today  Goals Unmet:  Not Applicable  Comments: Pt able to follow exercise prescription today without complaint.  Will continue to monitor for progression.    Dr. Emily Filbert is Medical Director for [place].  Dr. Ottie Glazier is Medical Director for [facility] Pulmonary Rehabilitation.

## 2020-10-23 ENCOUNTER — Encounter: Payer: Medicare Other | Admitting: *Deleted

## 2020-10-23 ENCOUNTER — Other Ambulatory Visit: Payer: Self-pay

## 2020-10-23 DIAGNOSIS — J8410 Pulmonary fibrosis, unspecified: Secondary | ICD-10-CM

## 2020-10-23 NOTE — Progress Notes (Signed)
Daily Session Note  Patient Details  Name: Susan Sosa MRN: [id number] Date of Birth: 02-17-1940 Referring Provider:   Flowsheet Row Pulmonary Rehab from 08/12/2020 in [facility] Cardiac and Pulmonary Rehab  Referring Provider Dr. Ottie Glazier MD       Encounter Date: 10/23/2020  Check In:  Session Check In - 10/23/20 1044       Check-In   Supervising physician immediately available to respond to emergencies See telemetry face sheet for immediately available ER MD    Location [facility]Card[facility] & Pulmonary Rehab    Staff Present Nyoka Cowden, RN, BSN, [place], BS, ACSM CEP, Exercise Physiologist;Amanda Oletta Darter, [place], ACSM CEP, Exercise Physiologist    Virtual Visit No    Medication changes reported     No    Fall or balance concerns reported    No    Tobacco Cessation No Change    Warm-up and Cool-down Performed on first and last piece of equipment    Resistance Training Performed Yes    VAD Patient? No    PAD/SET Patient? No      Pain Assessment   Currently in Pain? No/denies                Social History   Tobacco Use  Smoking Status Former  Smokeless Tobacco Never    Goals Met:  Independence with exercise equipment Exercise tolerated well No report of concerns or symptoms today  Goals Unmet:  Not Applicable  Comments: Pt able to follow exercise prescription today without complaint.  Will continue to monitor for progression.    Dr. Emily Filbert is Medical Director for [place].  Dr. Ottie Glazier is Medical Director for [facility] Pulmonary Rehabilitation.

## 2020-10-28 ENCOUNTER — Encounter: Payer: Medicare Other | Attending: Pulmonary Disease

## 2020-10-28 ENCOUNTER — Other Ambulatory Visit: Payer: Self-pay

## 2020-10-28 DIAGNOSIS — J8410 Pulmonary fibrosis, unspecified: Secondary | ICD-10-CM | POA: Insufficient documentation

## 2020-10-28 NOTE — Progress Notes (Signed)
Daily Session Note  Patient Details  Name: Susan Sosa MRN: [id number] Date of Birth: 06-Mar-1940 Referring Provider:   Flowsheet Row Pulmonary Rehab from 08/12/2020 in [facility] Cardiac and Pulmonary Rehab  Referring Provider Dr. Ottie Glazier MD       Encounter Date: 10/28/2020  Check In:  Session Check In - 10/28/20 1002       Check-In   Supervising physician immediately available to respond to emergencies See telemetry face sheet for immediately available ER MD    Location [facility]-Cardiac & Pulmonary Rehab    Staff Present Birdie Sons, MPA, RN;Amanda Sommer, BA, ACSM CEP, Exercise Physiologist;Jessica [place], MA, RCEP, CCRP, CCET    Virtual Visit No    Medication changes reported     No    Fall or balance concerns reported    No    Tobacco Cessation No Change    Warm-up and Cool-down Performed on first and last piece of equipment    Resistance Training Performed Yes    VAD Patient? No    PAD/SET Patient? No      Pain Assessment   Currently in Pain? No/denies                Social History   Tobacco Use  Smoking Status Former  Smokeless Tobacco Never    Goals Met:  Independence with exercise equipment Exercise tolerated well No report of concerns or symptoms today Strength training completed today  Goals Unmet:  Not Applicable  Comments: Pt able to follow exercise prescription today without complaint.  Will continue to monitor for progression.    Dr. Emily Filbert is Medical Director for [place].  Dr. Ottie Glazier is Medical Director for [facility] Pulmonary Rehabilitation.

## 2020-10-29 ENCOUNTER — Encounter: Payer: Self-pay | Admitting: *Deleted

## 2020-10-29 DIAGNOSIS — J8410 Pulmonary fibrosis, unspecified: Secondary | ICD-10-CM

## 2020-10-29 NOTE — Progress Notes (Signed)
Pulmonary Individual Treatment Plan  Patient Details  Name: Susan Sosa MRN: [id number] Date of Birth: [birth year] Referring Provider:   Flowsheet Row Pulmonary Rehab from 08/12/2020 in [facility] Cardiac and Pulmonary Rehab  Referring Provider Dr. Ottie Glazier MD       Initial Encounter Date:  Flowsheet Row Pulmonary Rehab from 08/12/2020 in [facility] Cardiac and Pulmonary Rehab  Date 08/12/20       Visit Diagnosis: Pulmonary fibrosis ([place])  Patient's Home Medications on Admission:  Current Outpatient Medications:    albuterol (PROVENTIL) (2.5 MG/3ML) 0.083% nebulizer solution, Inhale into the lungs., Disp: , Rfl:    ascorbic acid (VITAMIN C) 500 MG tablet, ascorbic acid (vitamin C) 500 mg tablet   1  by oral route., Disp: , Rfl:    aspirin 81 MG chewable tablet, aspirin 81 mg chewable tablet   81 mg by oral route., Disp: , Rfl:    atorvastatin (LIPITOR) 40 MG tablet, Take 1 tablet by mouth daily., Disp: , Rfl:    calcium-vitamin D (OSCAL WITH D) 500-200 MG-UNIT TABS tablet, Take 1 tablet by mouth daily., Disp: , Rfl:    cyanocobalamin 1000 MCG tablet, cyanocobalamin (vit B-12) 1,000 mcg tablet   1000 ugs by oral route., Disp: , Rfl:    esomeprazole (NEXIUM) 40 MG capsule, Take 1 capsule by mouth at bedtime., Disp: , Rfl:    hydrALAZINE (APRESOLINE) 10 MG tablet, TAKE 1 TABLET BY MOUTH NIGHTLY AS NEEDED, Disp: , Rfl:    levalbuterol (XOPENEX) 1.25 MG/3ML nebulizer solution, Inhale into the lungs., Disp: , Rfl:    losartan (COZAAR) 50 MG tablet, 1 tab by mouth daily around 5pm, Disp: , Rfl:    magnesium oxide (MAG-OX) 400 MG tablet, magnesium oxide 400 mg (241.3 mg magnesium) tablet   400 mg twice a day by oral route., Disp: , Rfl:    multivitamin-iron-minerals-folic acid (CENTRUM) chewable tablet, Chew by mouth., Disp: , Rfl:    naproxen sodium (ALEVE) 220 MG tablet, naproxen sodium 220 mg tablet   440 mg twice a day by oral route., Disp: , Rfl:    Pirfenidone (ESBRIET) 267 MG CAPS, Take by  mouth., Disp: , Rfl:    prednisoLONE acetate (PRED FORTE) 1 % ophthalmic suspension, prednisolone acetate 1 % eye drops,suspension  INSTILL 1 DROP INTO LEFT EYE 3 TIMES A DAY, Disp: , Rfl:    triamcinolone cream (KENALOG) 0.1 %, APPLY TO AFFECTED AREA TWICE A DAY, Disp: , Rfl:    triamterene-hydrochlorothiazide (MAXZIDE) 75-50 MG tablet, TAKE 1 BY MOUTH EVERY DAY., Disp: , Rfl:    verapamil (CALAN-SR) 180 MG CR tablet, Take by mouth., Disp: , Rfl:    vitamin E 180 MG (400 UNITS) capsule, vitamin E 268 mg (400 unit) capsule   400 units by oral route., Disp: , Rfl:   Past Medical History: Past Medical History:  Diagnosis Date   Hypertension     Tobacco Use: Social History   Tobacco Use  Smoking Status Former  Smokeless Tobacco Never    Labs: Recent Review Flowsheet Data   There is no flowsheet data to display.      Pulmonary Assessment Scores:  Pulmonary Assessment Scores     Row Name 08/12/20 1440         ADL UCSD   ADL Phase Entry     SOB Score total 23     Rest 0     Walk 1     Stairs 3     Bath 1  Dress 1     Shop 1           CAT Score   CAT Score 11           mMRC Score   mMRC Score 1              UCSD: Self-administered rating of dyspnea associated with activities of daily living (ADLs) 6-point scale (0 = "not at all" to 5 = "maximal or unable to do because of breathlessness")  Scoring Scores range from 0 to 120.  Minimally important difference is 5 units  CAT: CAT can identify the health impairment of COPD patients and is better correlated with disease progression.  CAT has a scoring range of zero to 40. The CAT score is classified into four groups of low (less than 10), medium (10 - 20), high (21-30) and very high (31-40) based on the impact level of disease on health status. A CAT score over 10 suggests significant symptoms.  A worsening CAT score could be explained by an exacerbation, poor medication adherence, poor inhaler technique, or  progression of COPD or comorbid conditions.  CAT MCID is 2 points  mMRC: mMRC (Modified Medical Research Council) Dyspnea Scale is used to assess the degree of baseline functional disability in patients of respiratory disease due to dyspnea. No minimal important difference is established. A decrease in score of 1 point or greater is considered a positive change.   Pulmonary Function Assessment:   Exercise Target Goals: Exercise Program Goal: Individual exercise prescription set using results from initial 6 min walk test and THRR while considering  patient's activity barriers and safety.   Exercise Prescription Goal: Initial exercise prescription builds to 30-45 minutes a day of aerobic activity, 2-3 days per week.  Home exercise guidelines will be given to patient during program as part of exercise prescription that the participant will acknowledge.  Education: Aerobic Exercise: - Group verbal and visual presentation on the components of exercise prescription. Introduces F.I.T.T principle from ACSM for exercise prescriptions.  Reviews F.I.T.T. principles of aerobic exercise including progression. Written material given at graduation. Flowsheet Row Pulmonary Rehab from 10/16/2020 in [facility] Cardiac and Pulmonary Rehab  Date 09/18/20  Educator [facility]  Instruction Review Code 1- Verbalizes Understanding       Education: Resistance Exercise: - Group verbal and visual presentation on the components of exercise prescription. Introduces F.I.T.T principle from ACSM for exercise prescriptions  Reviews F.I.T.T. principles of resistance exercise including progression. Written material given at graduation. Flowsheet Row Pulmonary Rehab from 10/16/2020 in [facility] Cardiac and Pulmonary Rehab  Date 09/25/20  Educator [facility]  Instruction Review Code 1- Verbalizes Understanding        Education: Exercise & Equipment Safety: - Individual verbal instruction and demonstration of equipment use and safety with use  of the equipment. Flowsheet Row Pulmonary Rehab from 10/16/2020 in [facility] Cardiac and Pulmonary Rehab  Education need identified 08/12/20  Date 08/12/20  Educator [place]  Instruction Review Code 1- Verbalizes Understanding       Education: Exercise Physiology & General Exercise Guidelines: - Group verbal and written instruction with models to review the exercise physiology of the cardiovascular system and associated critical values. Provides general exercise guidelines with specific guidelines to those with heart or lung disease.    Education: Flexibility, Balance, Mind/Body Relaxation: - Group verbal and visual presentation with interactive activity on the components of exercise prescription. Introduces F.I.T.T principle from ACSM for exercise prescriptions. Reviews F.I.T.T. principles of flexibility and balance  exercise training including progression. Also discusses the mind body connection.  Reviews various relaxation techniques to help reduce and manage stress (i.e. Deep breathing, progressive muscle relaxation, and visualization). Balance handout provided to take home. Written material given at graduation. Flowsheet Row Pulmonary Rehab from 10/16/2020 in [facility] Cardiac and Pulmonary Rehab  Date 10/02/20  Educator [facility]  Instruction Review Code 1- Verbalizes Understanding       Activity Barriers & Risk Stratification:  Activity Barriers & Cardiac Risk Stratification - 08/12/20 1158       Activity Barriers & Cardiac Risk Stratification   Activity Barriers Shortness of Breath;Joint Problems;Deconditioning;Back Problems;Muscular Weakness;Other (comment)    Comments Right knee- gets cortisone shots             6 Minute Walk:  6 Minute Walk     Row Name 08/12/20 1158         6 Minute Walk   Phase Initial     Distance 500 feet     Walk Time 3.5 minutes  Stopped at 3:30 due to O2 sats     # of Rest Breaks 0     MPH 1.62     METS 1.08     RPE 12     Perceived Dyspnea  2     VO2  Peak 3.78     Symptoms No     Resting HR 67 bpm     Resting BP 130/62     Resting Oxygen Saturation  97 %     Exercise Oxygen Saturation  during 6 min walk 79 %     Max Ex. HR 111 bpm     Max Ex. BP 162/68     2 Minute Post BP 134/68           Interval HR   1 Minute HR 87     2 Minute HR 101     3 Minute HR 111  Stopped at 3:30 due to O2 < 80%     Interval Heart Rate? Yes           Interval Oxygen   Interval Oxygen? Yes     Baseline Oxygen Saturation % 97 %     1 Minute Oxygen Saturation % 93 %     1 Minute Liters of Oxygen 2 L     2 Minute Oxygen Saturation % 87 %     2 Minute Liters of Oxygen 2 L     3 Minute Oxygen Saturation % 79 %     3 Minute Liters of Oxygen 2 L     4 Minute Oxygen Saturation % --  stopped     5 Minute Oxygen Saturation % --  stopped     6 Minute Oxygen Saturation % --  stopped     2 Minute Post Oxygen Saturation % 97 %     2 Minute Post Liters of Oxygen 3 L             Oxygen Initial Assessment:  Oxygen Initial Assessment - 10/14/20 1045       Home Oxygen   Home Oxygen Device Home Concentrator;Portable Concentrator    Sleep Oxygen Prescription Continuous    Liters per minute 2    Home Exercise Oxygen Prescription Continuous    Liters per minute 2    Home Resting Oxygen Prescription Continuous    Liters per minute 2    Compliance with Home Oxygen Use Yes      Initial 6 min  Walk   Oxygen Used Continuous    Liters per minute 2      Program Oxygen Prescription   Program Oxygen Prescription Continuous    Liters per minute 2    Comments Shaquandra feels better when walking as she feels she can go longer now. She still uses PLB when needed and states her O2 never drops below 88%.      Intervention   Short Term Goals To learn and demonstrate proper pursed lip breathing techniques or other breathing techniques. ;To learn and understand importance of maintaining oxygen saturations>88%;To learn and understand importance of monitoring SPO2 with  pulse oximeter and demonstrate accurate use of the pulse oximeter.    Long  Term Goals Exhibits proper breathing techniques, such as pursed lip breathing or other method taught during program session;Maintenance of O2 saturations>88%;Verbalizes importance of monitoring SPO2 with pulse oximeter and return demonstration             Oxygen Re-Evaluation:  Oxygen Re-Evaluation     Row Name 08/14/20 0947 09/16/20 1021           Program Oxygen Prescription   Program Oxygen Prescription Continuous Continuous      Liters per minute 2 2             Home Oxygen   Home Oxygen Device Home Concentrator;Portable Concentrator Home Concentrator;Portable Concentrator      Sleep Oxygen Prescription Continuous Continuous      Liters per minute 2 2      Home Exercise Oxygen Prescription Continuous --      Liters per minute 2 --      Home Resting Oxygen Prescription Continuous Continuous      Liters per minute 2 --      Compliance with Home Oxygen Use Yes Yes             Goals/Expected Outcomes   Short Term Goals To learn and demonstrate proper pursed lip breathing techniques or other breathing techniques.  To learn and demonstrate proper pursed lip breathing techniques or other breathing techniques.       Long  Term Goals Exhibits proper breathing techniques, such as pursed lip breathing or other method taught during program session Exhibits proper breathing techniques, such as pursed lip breathing or other method taught during program session      Comments Reviewed PLB technique with pt.  Talked about how it works and it's importance in maintaining their exercise saturations.      Short: Become more profiecient at using PLB.   Long: Become independent at using PLB. Smrithi is still utilizing PLB when needed. She checks her O2 at home- she admits only 1 time only dropping to 88%.      Goals/Expected Outcomes Short: Become more profiecient at using PLB.   Long: Become independent at using PLB. --                Oxygen Discharge (Final Oxygen Re-Evaluation):  Oxygen Re-Evaluation - 09/16/20 1021       Program Oxygen Prescription   Program Oxygen Prescription Continuous    Liters per minute 2      Home Oxygen   Home Oxygen Device Home Concentrator;Portable Concentrator    Sleep Oxygen Prescription Continuous    Liters per minute 2    Home Resting Oxygen Prescription Continuous    Compliance with Home Oxygen Use Yes      Goals/Expected Outcomes   Short Term Goals To learn and demonstrate proper pursed lip  breathing techniques or other breathing techniques.     Long  Term Goals Exhibits proper breathing techniques, such as pursed lip breathing or other method taught during program session    Comments Perris is still utilizing PLB when needed. She checks her O2 at home- she admits only 1 time only dropping to 88%.             Initial Exercise Prescription:  Initial Exercise Prescription - 08/12/20 1500       Date of Initial Exercise RX and Referring Provider   Date 08/12/20    Referring Provider Dr. Lanney Gins, Luvenia Heller MD      Treadmill   MPH 1.5    Grade 0    Minutes 15    METs 2.15      Recumbant Bike   Level 1    RPM 60    Watts 10    Minutes 15    METs 1.08      NuStep   Level 1    SPM 80    Minutes 15    METs 1.08      T5 Nustep   Level 1    SPM 80    Minutes 15    METs 1.08      Prescription Details   Frequency (times per week) 2    Duration Progress to 30 minutes of continuous aerobic without signs/symptoms of physical distress      Intensity   THRR 40-80% of Max Heartrate 95-124    Ratings of Perceived Exertion 11-13    Perceived Dyspnea 0-4      Progression   Progression Continue to progress workloads to maintain intensity without signs/symptoms of physical distress.      Resistance Training   Training Prescription Yes    Weight 3 lb    Reps 10-15             Perform Capillary Blood Glucose checks as needed.  Exercise  Prescription Changes:   Exercise Prescription Changes     Row Name 08/12/20 1500 08/27/20 1100 09/11/20 0900 09/23/20 1400 10/06/20 1000     Response to Exercise   Blood Pressure (Admit) 130/62 128/82 118/64 136/74 142/80   Blood Pressure (Exercise) 162/68 132/74 132/62 -- --   Blood Pressure (Exit) 134/66 122/60 104/64 104/58 110/64   Heart Rate (Admit) 67 bpm 71 bpm 74 bpm 75 bpm 67 bpm   Heart Rate (Exercise) 111 bpm 99 bpm 101 bpm 101 bpm 103 bpm   Heart Rate (Exit) 72 bpm 82 bpm 85 bpm 86 bpm 81 bpm   Oxygen Saturation (Admit) 97 % 96 % 92 % 98 % 98 %   Oxygen Saturation (Exercise) 79 % 94 % 88 % 93 % 90 %   Oxygen Saturation (Exit) 97 % 98 % -- 99 % 98 %   Rating of Perceived Exertion (Exercise) '12 12 13 13 13   ' Perceived Dyspnea (Exercise) 2 1 0 2 0   Symptoms none none none SOB none   Comments walk test results -- -- -- --   Duration -- Progress to 30 minutes of  aerobic without signs/symptoms of physical distress Progress to 30 minutes of  aerobic without signs/symptoms of physical distress Continue with 30 min of aerobic exercise without signs/symptoms of physical distress. Continue with 30 min of aerobic exercise without signs/symptoms of physical distress.   Intensity -- THRR unchanged THRR unchanged THRR unchanged THRR unchanged     Progression   Progression -- Continue to  progress workloads to maintain intensity without signs/symptoms of physical distress. Continue to progress workloads to maintain intensity without signs/symptoms of physical distress. Continue to progress workloads to maintain intensity without signs/symptoms of physical distress. Continue to progress workloads to maintain intensity without signs/symptoms of physical distress.   Average METs -- 1.8 2.06 2.3 2.38     Resistance Training   Training Prescription -- Yes Yes Yes Yes   Weight -- 3 lb 3 lb 3 lb 3 lb   Reps -- 10-15 10-15 10-15 10-15     Interval Training   Interval Training -- No No No No      Treadmill   MPH -- 1.5 1.7 1.8 1.7   Grade -- 0 2.5 1.5 2.5   Minutes -- '15 15 15 15   ' METs -- 2.15 2.89 2.75 2.89     NuStep   Level -- 1 3 -- 3   Minutes -- 15 15 -- 15   METs -- 1.8 1.7 -- 1.7     Arm Ergometer   Level -- -- -- -- 2   Minutes -- -- -- -- 15   METs -- -- -- -- 1     T5 Nustep   Level -- -- -- 3 --   Minutes -- -- -- 15 --   METs -- -- -- 1.7 --     Home Exercise Plan   Plans to continue exercise at -- -- [employer] (comment)  walking at the mall; [employer] (comment)  walking at the mall; [employer] (comment)  walking at the mall; staff videos   Frequency -- -- Add 2 additional days to program exercise sessions. Add 2 additional days to program exercise sessions. Add 2 additional days to program exercise sessions.   Initial Home Exercises Provided -- -- 09/04/20 09/04/20 09/04/20    Row Name 10/22/20 1400             Response to Exercise   Blood Pressure (Admit) 138/58       Blood Pressure (Exit) 112/60       Heart Rate (Admit) 69 bpm       Heart Rate (Exercise) 104 bpm       Heart Rate (Exit) 92 bpm       Oxygen Saturation (Admit) 95 %       Oxygen Saturation (Exercise) 97 %       Oxygen Saturation (Exit) 98 %       Rating of Perceived Exertion (Exercise) 13       Perceived Dyspnea (Exercise) 0       Symptoms none       Duration Continue with 30 min of aerobic exercise without signs/symptoms of physical distress.       Intensity THRR unchanged               Progression   Progression Continue to progress workloads to maintain intensity without signs/symptoms of physical distress.       Average METs 2.28               Resistance Training   Training Prescription Yes       Weight 3 lb       Reps 10-15               Interval Training   Interval Training No               Treadmill   MPH 1.8  Grade 2.5       Minutes 15       METs 3               T5 Nustep   Level 3        Minutes 15       METs 1.8               Home Exercise Plan   Plans to continue exercise at [employer] (comment)  walking at the mall; staff videos       Frequency Add 2 additional days to program exercise sessions.       Initial Home Exercises Provided 09/04/20                Exercise Comments:   Exercise Comments     Row Name 08/14/20 0946           Exercise Comments First full day of exercise!  Patient was oriented to gym and equipment including functions, settings, policies, and procedures.  Patient's individual exercise prescription and treatment plan were reviewed.  All starting workloads were established based on the results of the 6 minute walk test done at initial orientation visit.  The plan for exercise progression was also introduced and progression will be customized based on patient's performance and goals.                Exercise Goals and Review:   Exercise Goals     Row Name 08/12/20 1509             Exercise Goals   Increase Physical Activity Yes       Intervention Provide advice, education, support and counseling about physical activity/exercise needs.;Develop an individualized exercise prescription for aerobic and resistive training based on initial evaluation findings, risk stratification, comorbidities and participant's personal goals.       Expected Outcomes Long Term: Add in home exercise to make exercise part of routine and to increase amount of physical activity.;Short Term: Attend rehab on a regular basis to increase amount of physical activity.;Long Term: Exercising regularly at least 3-5 days a week.       Increase Strength and Stamina Yes       Intervention Provide advice, education, support and counseling about physical activity/exercise needs.;Develop an individualized exercise prescription for aerobic and resistive training based on initial evaluation findings, risk stratification, comorbidities and participant's personal goals.        Expected Outcomes Short Term: Increase workloads from initial exercise prescription for resistance, speed, and METs.;Short Term: Perform resistance training exercises routinely during rehab and add in resistance training at home;Long Term: Improve cardiorespiratory fitness, muscular endurance and strength as measured by increased METs and functional capacity (6MWT)       Able to understand and use rate of perceived exertion (RPE) scale Yes       Intervention Provide education and explanation on how to use RPE scale       Expected Outcomes Short Term: Able to use RPE daily in rehab to express subjective intensity level;Long Term:  Able to use RPE to guide intensity level when exercising independently       Able to understand and use Dyspnea scale Yes       Intervention Provide education and explanation on how to use Dyspnea scale       Expected Outcomes Short Term: Able to use Dyspnea scale daily in rehab to express subjective sense of shortness of breath during exertion;Long Term: Able to use  Dyspnea scale to guide intensity level when exercising independently       Knowledge and understanding of Target Heart Rate Range (THRR) Yes       Intervention Provide education and explanation of THRR including how the numbers were predicted and where they are located for reference       Expected Outcomes Short Term: Able to state/look up THRR;Long Term: Able to use THRR to govern intensity when exercising independently;Short Term: Able to use daily as guideline for intensity in rehab       Able to check pulse independently Yes       Intervention Provide education and demonstration on how to check pulse in carotid and radial arteries.;Review the importance of being able to check your own pulse for safety during independent exercise       Expected Outcomes Short Term: Able to explain why pulse checking is important during independent exercise;Long Term: Able to check pulse independently and accurately        Understanding of Exercise Prescription Yes       Intervention Provide education, explanation, and written materials on patient's individual exercise prescription       Expected Outcomes Short Term: Able to explain program exercise prescription;Long Term: Able to explain home exercise prescription to exercise independently                Exercise Goals Re-Evaluation :  Exercise Goals Re-Evaluation     Row Name 08/14/20 0946 08/27/20 1132 09/04/20 1016 09/11/20 0916 09/23/20 1439     Exercise Goal Re-Evaluation   Exercise Goals Review Understanding of Exercise Prescription;Able to check pulse independently;Knowledge and understanding of Target Heart Rate Range (THRR);Able to understand and use Dyspnea scale;Able to understand and use rate of perceived exertion (RPE) scale Increase Physical Activity;Increase Strength and Stamina Increase Physical Activity;Increase Strength and Stamina;Understanding of Exercise Prescription Increase Physical Activity;Increase Strength and Stamina;Understanding of Exercise Prescription Increase Physical Activity;Increase Strength and Stamina   Comments Reviewed RPE and dyspnea scales, THR and program prescription with pt today.  Pt voiced understanding and was given a copy of goals to take home. Libertie is doing well in her first sessions.  Lowest oxygen is 93% during exercise.  Staff will monitor progress. Reviewed home exercise with pt today.  Pt plans to walk for exercise. She already walks the mall about 2-3x/ week. She is thinking of joining the [employer]. Reviewed THR, pulse, RPE, sign and symptoms, pulse oximetery and when to call 911 or MD.  Also discussed weather considerations and indoor options.  Pt voiced understanding. Adela is doing well in rehab. She has increased her treadmill to 1.7 speed and 2.5% for the incline and is already to level 3 on the Nustep. Will continue to monitor progression. Aniella continues to do well. She attends consistently and sats stay in  the 90s.  Staff will encourage her to try 4 lb weights.   Expected Outcomes Short: Use RPE daily to regulate intensity. Long: Follow program prescription in THR. Short:  attend consistently Long:  build overall stamina Short: Check HR and O2 during exercise Long: Continue to exercise independently at appropriate prescription Short: Continue to increase load on treadmill as tolerated Long: Continue to increase overall MET level Short: try 4 lb Long :  build overall stamina    Row Name 10/06/20 1045 10/14/20 1023 10/22/20 1449         Exercise Goal Re-Evaluation   Exercise Goals Review Increase Physical Activity;Increase Strength and Stamina Increase Physical Activity;Increase  Strength and Stamina;Able to check pulse independently Increase Physical Activity;Increase Strength and Stamina     Comments Khamila is doing well in rehab. She has increased her load on the treadmill to 1.7 speed and 2.5 % incline.  RPEs have been appropriate through her exercise sessions. Her handweights should be encouraged to increase to 4 lbs. Jayleigh is walking for about 40 minutes 3x/week on top of 2 days of rehab. Her and her husband walk around the mall. She is still checking her HR and O2 and states her O2 never drops below 88%. She staed that she feels she can walk longer than she used to prior to starting the program. Tajha tried 4 lb weights today for strength work.  She attends consistently and reaches THR range.  We will continue to monitor progress.     Expected Outcomes Short: Continue to increase load on TM Long: Continue to increase overall MET level Short: Continue watching O2 and HR during home exercise Long: Continue to exercise independently at appropriate prescription Short:  maintain consistent attendance Long:  continue to build stamina              Discharge Exercise Prescription (Final Exercise Prescription Changes):  Exercise Prescription Changes - 10/22/20 1400       Response to Exercise   Blood  Pressure (Admit) 138/58    Blood Pressure (Exit) 112/60    Heart Rate (Admit) 69 bpm    Heart Rate (Exercise) 104 bpm    Heart Rate (Exit) 92 bpm    Oxygen Saturation (Admit) 95 %    Oxygen Saturation (Exercise) 97 %    Oxygen Saturation (Exit) 98 %    Rating of Perceived Exertion (Exercise) 13    Perceived Dyspnea (Exercise) 0    Symptoms none    Duration Continue with 30 min of aerobic exercise without signs/symptoms of physical distress.    Intensity THRR unchanged      Progression   Progression Continue to progress workloads to maintain intensity without signs/symptoms of physical distress.    Average METs 2.28      Resistance Training   Training Prescription Yes    Weight 3 lb    Reps 10-15      Interval Training   Interval Training No      Treadmill   MPH 1.8    Grade 2.5    Minutes 15    METs 3      T5 Nustep   Level 3    Minutes 15    METs 1.8      Home Exercise Plan   Plans to continue exercise at [employer] (comment)   walking at the mall; staff videos   Frequency Add 2 additional days to program exercise sessions.    Initial Home Exercises Provided 09/04/20             Nutrition:  Target Goals: Understanding of nutrition guidelines, daily intake of sodium <1536m, cholesterol <2050m calories 30% from fat and 7% or less from saturated fats, daily to have 5 or more servings of fruits and vegetables.  Education: All About Nutrition: -Group instruction provided by verbal, written material, interactive activities, discussions, models, and posters to present general guidelines for heart healthy nutrition including fat, fiber, MyPlate, the role of sodium in heart healthy nutrition, utilization of the nutrition label, and utilization of this knowledge for meal planning. Follow up email sent as well. Written material given at graduation. Flowsheet Row [facility]  Cardiac and [facility]monary Re[facility]  Date 10/16/20  Educator [facility]   Instruction Review Code 1- Verbalizes Understanding       Biometrics:  Pre Biometrics - 08/12/20 1157       Pre Biometrics   Height 5' (1.524 m)    Weight 145 lb 11.2 oz (66.1 kg)    BMI (Calculated) 28.46    Single Leg Stand 10.03 seconds              Nutrition Therapy Plan and Nutrition Goals:  Nutrition Therapy & Goals - 08/26/20 0920       Nutrition Therapy   Diet Heart healthy, low Na, pulmonary MNT    Drug/Food Interactions Statins/Certain Fruits    Protein (specify units) 80g    Fiber 25 grams    Whole Grain Foods 3 servings    Saturated Fats 12 max. grams    Fruits and Vegetables 8 servings/day    Sodium 1.5 grams      Personal Nutrition Goals   Nutrition Goal ST: try soymilk, add avocado or peanut butter to lunch, swap out protein shake (Ensure, Boost, Orgain) LT: maintain weight    Comments B: cereal (frosted flakes and mini wheats and almond milk) or sausage biscuit S: premier protein L: not hungry - she thinks this is due to her medication. 1-2 eggs and bread D: meat (chicken, steak, limits her pork intake (she feels she is allergic) - grilled)and some vegetables (peas, pinto beans, beets, corn, small amounts of potato - if she does it is a sweet potato, spinach, turnip greens - little grapeseed oil) S: apple. Drinks: water.  She will cook beans from dried. Does not cook with salt. She has a lower appetite - has been for years. She has lost 30 lbs over 3-4 months (2 years ago) - gained 10 pounds back. She drinks premier protein which has recently been recalled - provided information on lot numbers and encourgaed her to check her products - suggested swapping and trying Ensure, Orgain, or Boost. suggested swapping almond milk for fortified soymilk and adding avocados or peanut butter to her lunch. Discussed heart healthy eating and pulmonary friendly eating.      Intervention Plan   Intervention Prescribe, educate and counsel regarding individualized specific  dietary modifications aiming towards targeted core components such as weight, hypertension, lipid management, diabetes, heart failure and other comorbidities.;Nutrition handout(s) given to patient.    Expected Outcomes Short Term Goal: Understand basic principles of dietary content, such as calories, fat, sodium, cholesterol and nutrients.;Short Term Goal: A plan has been developed with personal nutrition goals set during dietitian appointment.;Long Term Goal: Adherence to prescribed nutrition plan.             Nutrition Assessments:  MEDIFICTS Score Key: ?70 Need to make dietary changes  40-70 Heart Healthy Diet ? 40 Therapeutic Level Cholesterol Diet  Flowsheet Row Pulmonary Rehab from 08/12/2020 in [facility] Cardiac and Pulmonary Rehab  Picture Your Plate Total Score on Admission 66      Picture Your Plate Scores: <71 Unhealthy dietary pattern with much room for improvement. 41-50 Dietary pattern unlikely to meet recommendations for good health and room for improvement. 51-60 More healthful dietary pattern, with some room for improvement.  >60 Healthy dietary pattern, although there may be some specific behaviors that could be improved.   Nutrition Goals Re-Evaluation:  Nutrition Goals Re-Evaluation     [place] Name 09/16/20 1028 10/14/20 1025           Goals  Nutrition Goal ST: try soymilk, add avocado or peanut butter to lunch, swap out protein shake (Ensure, Boost, Orgain) LT: maintain weight ST: try soymilk, add avocado or peanut butter to lunch, swap out protein shake (Ensure, Boost, Orgain) LT: maintain weight      Comment Hilde is doing well with her changes. Neve has been drinking Atkins shake once per day to help increase her protein intake. She has been adding peanut butter to her crackers as well as another change. Abiola is doing well. She is drinking boost almost daily for extra protein in her diet. She is also adding  peanut butter on celery or crackers. She finds them  filling. She weighs herself at home and her weight has been maintained well.      Expected Outcome Short: Continue to follow RD goals Long: Continue to eat a balanced- pulmonary based diet Short: Continue to follow RD recommendations Long: Continue eating a pulmonary-based diet               Nutrition Goals Discharge (Final Nutrition Goals Re-Evaluation):  Nutrition Goals Re-Evaluation - 10/14/20 1025       Goals   Nutrition Goal ST: try soymilk, add avocado or peanut butter to lunch, swap out protein shake (Ensure, Boost, Orgain) LT: maintain weight    Comment Emilyn is doing well. She is drinking boost almost daily for extra protein in her diet. She is also adding  peanut butter on celery or crackers. She finds them filling. She weighs herself at home and her weight has been maintained well.    Expected Outcome Short: Continue to follow RD recommendations Long: Continue eating a pulmonary-based diet             Psychosocial: Target Goals: Acknowledge presence or absence of significant depression and/or stress, maximize coping skills, provide positive support system. Participant is able to verbalize types and ability to use techniques and skills needed for reducing stress and depression.   Education: Stress, Anxiety, and Depression - Group verbal and visual presentation to define topics covered.  Reviews how body is impacted by stress, anxiety, and depression.  Also discusses healthy ways to reduce stress and to treat/manage anxiety and depression.  Written material given at graduation. Flowsheet Row Pulmonary Rehab from 10/16/2020 in [facility] Cardiac and Pulmonary Rehab  Date 09/04/20  Educator [facility]  Instruction Review Code 1- [employer] Understanding       Education: Sleep Hygiene -Provides group verbal and written instruction about how sleep can affect your health.  Define sleep hygiene, discuss sleep cycles and impact of sleep habits. Review good sleep hygiene tips.    Initial  Review & Psychosocial Screening:  Initial Psych Review & Screening - 08/04/20 1545       Initial Review   Current issues with Current Sleep Concerns      Family Dynamics   Good Support System? Yes   husband     Barriers   Psychosocial barriers to participate in program There are no identifiable barriers or psychosocial needs.;The patient should benefit from training in stress management and relaxation.      Screening Interventions   Interventions Encouraged to exercise;Provide feedback about the scores to participant;To provide support and resources with identified psychosocial needs    Expected Outcomes Short Term goal: Utilizing psychosocial counselor, staff and physician to assist with identification of specific Stressors or current issues interfering with healing process. Setting desired goal for each stressor or current issue identified.;Long Term Goal: Stressors or current issues are  controlled or eliminated.;Short Term goal: Identification and review with participant of any Quality of Life or Depression concerns found by scoring the questionnaire.;Long Term goal: The participant improves quality of Life and PHQ9 Scores as seen by post scores and/or verbalization of changes             Quality of Life Scores:  Scores of 19 and below usually indicate a poorer quality of life in these areas.  A difference of  2-3 points is a clinically meaningful difference.  A difference of 2-3 points in the total score of the Quality of Life Index has been associated with significant improvement in overall quality of life, self-image, physical symptoms, and general health in studies assessing change in quality of life.  PHQ-9: Recent Review Flowsheet Data     Depression screen [facility] 2/9 08/12/2020   Decreased Interest 0   Down, Depressed, Hopeless 0   PHQ - 2 Score 0   Altered sleeping 0   Tired, decreased energy 1   Change in appetite 0   Feeling bad or failure about yourself  0   Trouble  concentrating 0   Moving slowly or fidgety/restless 0   Suicidal thoughts 0   PHQ-9 Score 1   Difficult doing work/chores Not difficult at all      Interpretation of Total Score  Total Score Depression Severity:  1-4 = Minimal depression, 5-9 = Mild depression, 10-14 = Moderate depression, 15-19 = Moderately severe depression, 20-27 = Severe depression   Psychosocial Evaluation and Intervention:  Psychosocial Evaluation - 08/04/20 1546       Psychosocial Evaluation & Interventions   Interventions Encouraged to exercise with the program and follow exercise prescription    Comments 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 She has been dealing with this diagnosis for the past two years, but is proud that the doctor said she seems to be holding her own. She reports that she already has lost 30% of her lung capacity and is working hard to maintain her current lung function which is why she is motivated to come to rehab. She walks 2-3 times a week at [facility] and is diligent about her breathing exercises and medications. She wears oxygen as well. Her husband is her main support system. She does not report any stressors currently as she feels like she is handling her current health status appropriately. She has noticed in the last 6 months that her sleep has been altered, where she used to fall asleep quickly now it is taking her over an hour to fall back asleep after waking to use the bathroom.    Expected Outcomes Short: attend pulmonary rehab for education and exercise. Long: develop and maintain positive self care habits.    Continue Psychosocial Services  Follow up required by staff             Psychosocial Re-Evaluation:  Psychosocial Re-Evaluation     [place] Name 09/16/20 1027 10/14/20 1029           Psychosocial Re-Evaluation   Current issues with Current Sleep Concerns Current Sleep Concerns      Comments Evadean is holding up well mentally. Her sleep  isnt the best when she gets up to use the restroom throughout the night. She does not have any desire to ask her doctor for any sleep aide. She does have good support from family and husband specifically Debria is still doing well. She still gets up 2-3x/night, she does  not want to take any other medications. She is currently not taking any medications for sleep or depression and states she has no big stressors at this time. Her and her husband walk a lot at the mall to keep them busy.      Expected Outcomes Short: Continue attendance with rehab Long: Maintain positive attitude Short: Continue walking at the mall for positive attitude Long: Continue to utilize exercise for stress management      Interventions Encouraged to attend Cardiac Rehabilitation for the exercise Encouraged to attend Pulmonary Rehabilitation for the exercise      Continue Psychosocial Services  Follow up required by staff Follow up required by staff               Psychosocial Discharge (Final Psychosocial Re-Evaluation):  Psychosocial Re-Evaluation - 10/14/20 1029       Psychosocial Re-Evaluation   Current issues with Current Sleep Concerns    Comments Litha is still doing well. She still gets up 2-3x/night, she does not want to take any other medications. She is currently not taking any medications for sleep or depression and states she has no big stressors at this time. Her and her husband walk a lot at the mall to keep them busy.    Expected Outcomes Short: Continue walking at the mall for positive attitude Long: Continue to utilize exercise for stress management    Interventions Encouraged to attend Pulmonary Rehabilitation for the exercise    Continue Psychosocial Services  Follow up required by staff             Education: Education Goals: Education classes will be provided on a weekly basis, covering required topics. Participant will state understanding/return demonstration of topics presented.  Learning  Barriers/Preferences:   General Pulmonary Education Topics:  Infection Prevention: - Provides verbal and written material to individual with discussion of infection control including proper hand washing and proper equipment cleaning during exercise session. Flowsheet Row Pulmonary Rehab from 10/16/2020 in [facility] Cardiac and Pulmonary Rehab  Education need identified 08/12/20  Date 08/12/20  Educator [place]  Instruction Review Code 1- Verbalizes Understanding       Falls Prevention: - Provides verbal and written material to individual with discussion of falls prevention and safety. Flowsheet Row Pulmonary Rehab from 10/16/2020 in [facility] Cardiac and Pulmonary Rehab  Education need identified 08/12/20  Date 08/12/20  Educator [place]  Instruction Review Code 1- Verbalizes Understanding       Chronic Lung Disease Review: - Group verbal instruction with posters, models, PowerPoint presentations and videos,  to review new updates, new respiratory medications, new advancements in procedures and treatments. Providing information on websites and "800" numbers for continued self-education. Includes information about supplement oxygen, available portable oxygen systems, continuous and intermittent flow rates, oxygen safety, concentrators, and Medicare reimbursement for oxygen. Explanation of Pulmonary Drugs, including class, frequency, complications, importance of spacers, rinsing mouth after steroid MDI's, and proper cleaning methods for nebulizers. Review of basic lung anatomy and physiology related to function, structure, and complications of lung disease. Review of risk factors. Discussion about methods for diagnosing sleep apnea and types of masks and machines for OSA. Includes a review of the use of types of environmental controls: home humidity, furnaces, filters, dust mite/pet prevention, HEPA vacuums. Discussion about weather changes, air quality and the benefits of nasal washing. Instruction on Warning  signs, infection symptoms, calling MD promptly, preventive modes, and value of vaccinations. Review of effective airway clearance, coughing and/or vibration techniques. Emphasizing that all  should Create an Sports administrator. Written material given at graduation. Flowsheet Row Pulmonary Rehab from 10/16/2020 in [facility] Cardiac and Pulmonary Rehab  Date 08/28/20  Educator [facility]  Instruction Review Code 1- Verbalizes Understanding       AED/CPR: - Group verbal and written instruction with the use of models to demonstrate the basic use of the AED with the basic ABC's of resuscitation.    Anatomy and Cardiac Procedures: - Group verbal and visual presentation and models provide information about basic cardiac anatomy and function. Reviews the testing methods done to diagnose heart disease and the outcomes of the test results. Describes the treatment choices: Medical Management, Angioplasty, or Coronary Bypass Surgery for treating various heart conditions including Myocardial Infarction, Angina, Valve Disease, and Cardiac Arrhythmias.  Written material given at graduation. Flowsheet Row Pulmonary Rehab from 10/16/2020 in [facility] Cardiac and Pulmonary Rehab  Date 09/25/20  Educator [place]  Instruction Review Code 1- Verbalizes Understanding       Medication Safety: - Group verbal and visual instruction to review commonly prescribed medications for heart and lung disease. Reviews the medication, class of the drug, and side effects. Includes the steps to properly store meds and maintain the prescription regimen.  Written material given at graduation. Flowsheet Row Pulmonary Rehab from 10/16/2020 in [facility] Cardiac and Pulmonary Rehab  Date 08/14/20  Educator SB  Instruction Review Code 1- Verbalizes Understanding       Other: -Provides group and verbal instruction on various topics (see comments)   Knowledge Questionnaire Score:  Knowledge Questionnaire Score - 08/12/20 1443       Knowledge Questionnaire  Score   Pre Score 15/18: Oxygen saturation, Albuterol, Oxygen flame              Core Components/Risk Factors/Patient Goals at Admission:  Personal Goals and Risk Factors at Admission - 08/12/20 1508       Core Components/Risk Factors/Patient Goals on Admission    Weight Management Yes;Weight Maintenance    Intervention Weight Management: Develop a combined nutrition and exercise program designed to reach desired caloric intake, while maintaining appropriate intake of nutrient and fiber, sodium and fats, and appropriate energy expenditure required for the weight goal.;Weight Management: Provide education and appropriate resources to help participant work on and attain dietary goals.;Weight Management/Obesity: Establish reasonable short term and long term weight goals.    Admit Weight 145 lb (65.8 kg)    Goal Weight: Short Term 145 lb (65.8 kg)    Goal Weight: Long Term 145 lb (65.8 kg)    Expected Outcomes Short Term: Continue to assess and modify interventions until short term weight is achieved;Weight Maintenance: Understanding of the daily nutrition guidelines, which includes 25-35% calories from fat, 7% or less cal from saturated fats, less than 238m cholesterol, less than 1.5gm of sodium, & 5 or more servings of fruits and vegetables daily;Understanding recommendations for meals to include 15-35% energy as protein, 25-35% energy from fat, 35-60% energy from carbohydrates, less than 2072mof dietary cholesterol, 20-35 gm of total fiber daily;Understanding of distribution of calorie intake throughout the day with the consumption of 4-5 meals/snacks    Improve shortness of breath with ADL's Yes    Intervention Provide education, individualized exercise plan and daily activity instruction to help decrease symptoms of SOB with activities of daily living.    Expected Outcomes Short Term: Improve cardiorespiratory fitness to achieve a reduction of symptoms when performing ADLs;Long Term: Be able  to perform more ADLs without symptoms or delay the onset  of symptoms    Hypertension Yes    Intervention Provide education on lifestyle modifcations including regular physical activity/exercise, weight management, moderate sodium restriction and increased consumption of fresh fruit, vegetables, and low fat dairy, alcohol moderation, and smoking cessation.;Monitor prescription use compliance.    Expected Outcomes Short Term: Continued assessment and intervention until BP is < 140/52m HG in hypertensive participants. < 130/863mHG in hypertensive participants with diabetes, heart failure or chronic kidney disease.;Long Term: Maintenance of blood pressure at goal levels.    Lipids Yes    Intervention Provide education and support for participant on nutrition & aerobic/resistive exercise along with prescribed medications to achieve LDL <7077mHDL >20m51m  Expected Outcomes Short Term: Participant states understanding of desired cholesterol values and is compliant with medications prescribed. Participant is following exercise prescription and nutrition guidelines.;Long Term: Cholesterol controlled with medications as prescribed, with individualized exercise RX and with personalized nutrition plan. Value goals: LDL < 70mg75mL > 40 mg.             Education:Diabetes - Individual verbal and written instruction to review signs/symptoms of diabetes, desired ranges of glucose level fasting, after meals and with exercise. Acknowledge that pre and post exercise glucose checks will be done for 3 sessions at entry of program.   Know Your Numbers and Heart Failure: - Group verbal and visual instruction to discuss disease risk factors for cardiac and pulmonary disease and treatment options.  Reviews associated critical values for Overweight/Obesity, Hypertension, Cholesterol, and Diabetes.  Discusses basics of heart failure: signs/symptoms and treatments.  Introduces Heart Failure Zone chart for action plan for  heart failure.  Written material given at graduation. Flowsheet Row [facility]  Date 08/21/20  Educator SB  Instruction Review Code 1- Verbalizes Understanding       Core Components/Risk Factors/Patient Goals Review:   Goals and Risk Factor Review     Row Name 09/16/20 1024 10/14/20 1026           Core Components/Risk Factors/Patient Goals Review   Personal Goals Review Weight Management/Obesity;Hypertension;Improve shortness of breath with ADL's Weight Management/Obesity;Hypertension;Improve shortness of breath with ADL's      Review Urvi Jenanks she lost weight a little bit and will continue to check it at home and make note of any abnormal changes. Checks BP at home typically runs around 130s/70s and fairly stable at rehab. She is taking all medications as prescribed. Breathing still feels the same so far but she recently was diagnosed with COVID. She is hoping she will feel better the more she recovers. Kerianne Xandreal checks her BP at home and is having problems with her BP increasing at night to around [phone].  She talked to her doctor and they increased her medication several weeks ago but unfortunately she hasn't seen a change yet. She states her BP during the day is stable..She goes back to see her doctor in a couple of weeks to follow up. She plans to call them before that. In regards to her breathing, At home, she feels she can walk more at home than she used to before starting the program. She is pleased thus far.      Expected Outcomes Short: Continue to watch weight at home Long: Continue to manage lifestyle risk factors Short: Check with doctor on BP and keep monitoring Long: Maintain stable blood pressure throughout the day  Core Components/Risk Factors/Patient Goals at Discharge (Final Review):   Goals and Risk Factor Review - 10/14/20 1026       Core Components/Risk Factors/Patient Goals Review    Personal Goals Review Weight Management/Obesity;Hypertension;Improve shortness of breath with ADL's    Review Arelis still checks her BP at home and is having problems with her BP increasing at night to around 355-732K systolic.  She talked to her doctor and they increased her medication several weeks ago but unfortunately she hasn't seen a change yet. She states her BP during the day is stable..She goes back to see her doctor in a couple of weeks to follow up. She plans to call them before that. In regards to her breathing, At home, she feels she can walk more at home than she used to before starting the program. She is pleased thus far.    Expected Outcomes Short: Check with doctor on BP and keep monitoring Long: Maintain stable blood pressure throughout the day             ITP Comments:  ITP Comments     Row Name 08/04/20 1557 08/12/20 1141 08/14/20 0946 08/26/20 1014 09/03/20 0738   ITP Comments Initial telephone orientation completed. [facility] can be found in [facility] 6/24. EP orientation scheduled for Tuesday 7/19 at 10am. Completed 6MWT and gym orientation. Initial ITP created and sent for review to Dr. Ottie Glazier, Medical Director. First full day of exercise!  Patient was oriented to gym and equipment including functions, settings, policies, and procedures.  Patient's individual exercise prescription and treatment plan were reviewed.  All starting workloads were established based on the results of the 6 minute walk test done at initial orientation visit.  The plan for exercise progression was also introduced and progression will be customized based on patient's performance and goals. Completed initial RD evaluation 30 Day review completed. Medical Director ITP review done, changes made as directed, and signed approval by Medical Director.    [place] Name 09/11/20 0915 10/01/20 0638 10/29/20 0931       ITP Comments Adaya tested positive for COVID on 8/12 and will be out until cleared to come  back 30 Day review completed. Medical Director ITP review done, changes made as directed, and signed approval by Medical Director. 30 day review completed. ITP sent to Dr. Zetta Bills, Medical Director of Pulmonary Rehab. Continue with ITP unless changes are made by physician.              Comments: 30 day review

## 2020-10-30 ENCOUNTER — Other Ambulatory Visit: Payer: Self-pay

## 2020-10-30 DIAGNOSIS — J8410 Pulmonary fibrosis, unspecified: Secondary | ICD-10-CM | POA: Diagnosis not present

## 2020-10-30 NOTE — Progress Notes (Signed)
Daily Session Note  Patient Details  Name: Susan Sosa MRN: [id number] Date of Birth: 03/01/1940 Referring Provider:   Flowsheet Row Pulmonary Rehab from 08/12/2020 in [facility] Cardiac and Pulmonary Rehab  Referring Provider Dr. Ottie Glazier MD       Encounter Date: 10/30/2020  Check In:  Session Check In - 10/30/20 0942       Check-In   Supervising physician immediately available to respond to emergencies See telemetry face sheet for immediately available ER MD    Location [facility]-Cardiac & Pulmonary Rehab    Staff Present Birdie Sons, MPA, RN;Jessica [place], MA, RCEP, CCRP, CCET;Amanda Sommer, BA, ACSM CEP, Exercise Physiologist    Virtual Visit No    Medication changes reported     No    Fall or balance concerns reported    No    Tobacco Cessation No Change    Warm-up and Cool-down Performed on first and last piece of equipment    Resistance Training Performed Yes    VAD Patient? No    PAD/SET Patient? No      Pain Assessment   Currently in Pain? No/denies                Social History   Tobacco Use  Smoking Status Former  Smokeless Tobacco Never    Goals Met:  Independence with exercise equipment Exercise tolerated well No report of concerns or symptoms today Strength training completed today  Goals Unmet:  Not Applicable  Comments: Pt able to follow exercise prescription today without complaint.  Will continue to monitor for progression.    Dr. Emily Filbert is Medical Director for [place].  Dr. Ottie Glazier is Medical Director for [facility] Pulmonary Rehabilitation.

## 2020-11-04 ENCOUNTER — Other Ambulatory Visit: Payer: Self-pay

## 2020-11-04 DIAGNOSIS — J8410 Pulmonary fibrosis, unspecified: Secondary | ICD-10-CM

## 2020-11-04 NOTE — Progress Notes (Signed)
Daily Session Note  Patient Details  Name: Susan Sosa MRN: [id number] Date of Birth: Dec 14, 1940 Referring Provider:   Flowsheet Row Pulmonary Rehab from 08/12/2020 in [facility] Cardiac and Pulmonary Rehab  Referring Provider Dr. Ottie Glazier MD       Encounter Date: 11/04/2020  Check In:  Session Check In - 11/04/20 1045       Check-In   Supervising physician immediately available to respond to emergencies See telemetry face sheet for immediately available ER MD    Location [facility]-Cardiac & Pulmonary Rehab    Staff Present Birdie Sons, MPA, RN;Jessica [place], MA, RCEP, CCRP, CCET;Amanda Sommer, BA, ACSM CEP, Exercise Physiologist    Virtual Visit No    Medication changes reported     No    Fall or balance concerns reported    No    Tobacco Cessation No Change    Warm-up and Cool-down Performed on first and last piece of equipment    Resistance Training Performed Yes    VAD Patient? No    PAD/SET Patient? No      Pain Assessment   Currently in Pain? No/denies                Social History   Tobacco Use  Smoking Status Former  Smokeless Tobacco Never    Goals Met:  Independence with exercise equipment Exercise tolerated well No report of concerns or symptoms today Strength training completed today  Goals Unmet:  Not Applicable  Comments: Pt able to follow exercise prescription today without complaint.  Will continue to monitor for progression.    Dr. Emily Filbert is Medical Director for [place].  Dr. Ottie Glazier is Medical Director for [facility] Pulmonary Rehabilitation.

## 2020-11-06 ENCOUNTER — Other Ambulatory Visit: Payer: Self-pay

## 2020-11-06 DIAGNOSIS — J8410 Pulmonary fibrosis, unspecified: Secondary | ICD-10-CM | POA: Diagnosis not present

## 2020-11-06 NOTE — Progress Notes (Signed)
Daily Session Note  Patient Details  Name: Susan Sosa MRN: [id number] Date of Birth: [birth year] Referring Provider:   Flowsheet Row Pulmonary Rehab from 08/12/2020 in [facility] Cardiac and Pulmonary Rehab  Referring Provider Dr. Ottie Glazier MD       Encounter Date: 11/06/2020  Check In:  Session Check In - 11/06/20 1007       Check-In   Supervising physician immediately available to respond to emergencies See telemetry face sheet for immediately available ER MD    Location [facility]-Cardiac & Pulmonary Rehab    Staff Present Birdie Sons, MPA, RN;Melissa [place], RDN, LDN;Meredith Sherryll Burger, RN Vickki Hearing, BA, ACSM CEP, Exercise Physiologist    Virtual Visit No    Medication changes reported     No    Fall or balance concerns reported    No    Tobacco Cessation No Change    Warm-up and Cool-down Performed on first and last piece of equipment    Resistance Training Performed Yes    VAD Patient? No    PAD/SET Patient? No      Pain Assessment   Currently in Pain? No/denies                Social History   Tobacco Use  Smoking Status Former  Smokeless Tobacco Never    Goals Met:  Independence with exercise equipment Exercise tolerated well No report of concerns or symptoms today Strength training completed today  Goals Unmet:  Not Applicable  Comments: Pt able to follow exercise prescription today without complaint.  Will continue to monitor for progression.    Dr. Emily Filbert is Medical Director for [place].  Dr. Ottie Glazier is Medical Director for [facility] Pulmonary Rehabilitation.

## 2020-11-11 ENCOUNTER — Other Ambulatory Visit: Payer: Self-pay

## 2020-11-11 DIAGNOSIS — J8410 Pulmonary fibrosis, unspecified: Secondary | ICD-10-CM

## 2020-11-11 NOTE — Progress Notes (Signed)
Daily Session Note  Patient Details  Name: Susan Sosa MRN: [id number] Date of Birth: 10-21-1940 Referring Provider:   Flowsheet Row Pulmonary Rehab from 08/12/2020 in [facility] Cardiac and Pulmonary Rehab  Referring Provider Dr. Ottie Glazier MD       Encounter Date: 11/11/2020  Check In:  Session Check In - 11/11/20 0952       Check-In   Supervising physician immediately available to respond to emergencies See telemetry face sheet for immediately available ER MD    Location [facility]-Cardiac & Pulmonary Rehab    Staff Present Birdie Sons, MPA, RN;Jessica [place], MA, RCEP, CCRP, CCET;Amanda Sommer, BA, ACSM CEP, Exercise Physiologist    Virtual Visit No    Medication changes reported     No    Fall or balance concerns reported    No    Tobacco Cessation No Change    Warm-up and Cool-down Performed on first and last piece of equipment    Resistance Training Performed Yes    VAD Patient? No    PAD/SET Patient? No      Pain Assessment   Currently in Pain? No/denies                Social History   Tobacco Use  Smoking Status Former  Smokeless Tobacco Never    Goals Met:  Independence with exercise equipment Exercise tolerated well No report of concerns or symptoms today Strength training completed today  Goals Unmet:  Not Applicable  Comments: Pt able to follow exercise prescription today without complaint.  Will continue to monitor for progression.    Dr. Emily Filbert is Medical Director for [place].  Dr. Ottie Glazier is Medical Director for [facility] Pulmonary Rehabilitation.

## 2020-11-13 ENCOUNTER — Other Ambulatory Visit: Payer: Self-pay

## 2020-11-13 DIAGNOSIS — J8410 Pulmonary fibrosis, unspecified: Secondary | ICD-10-CM

## 2020-11-13 NOTE — Progress Notes (Signed)
Daily Session Note  Patient Details  Name: Susan Sosa MRN: [id number] Date of Birth: [birth year] Referring Provider:   Flowsheet Row Pulmonary Rehab from 08/12/2020 in [facility] Cardiac and Pulmonary Rehab  Referring Provider Dr. Ottie Glazier MD       Encounter Date: 11/13/2020  Check In:  Session Check In - 11/13/20 0942       Check-In   Supervising physician immediately available to respond to emergencies See telemetry face sheet for immediately available ER MD    Location [facility]-Cardiac & Pulmonary Rehab    Staff Present Birdie Sons, MPA, RN;Jessica [place], MA, RCEP, CCRP, CCET;Amanda Sommer, BA, ACSM CEP, Exercise Physiologist;Saadiq Poche [place], BS, ACSM CEP, Exercise Physiologist    Virtual Visit No    Medication changes reported     No    Fall or balance concerns reported    No    Tobacco Cessation No Change    Warm-up and Cool-down Performed on first and last piece of equipment    Resistance Training Performed Yes    VAD Patient? No    PAD/SET Patient? No      Pain Assessment   Currently in Pain? No/denies                Social History   Tobacco Use  Smoking Status Former  Smokeless Tobacco Never    Goals Met:  Independence with exercise equipment Exercise tolerated well No report of concerns or symptoms today Strength training completed today  Goals Unmet:  Not Applicable  Comments: Pt able to follow exercise prescription today without complaint.  Will continue to monitor for progression.    Dr. Emily Filbert is Medical Director for [place].  Dr. Ottie Glazier is Medical Director for [facility] Pulmonary Rehabilitation.

## 2020-11-18 ENCOUNTER — Other Ambulatory Visit: Payer: Self-pay

## 2020-11-18 DIAGNOSIS — J8410 Pulmonary fibrosis, unspecified: Secondary | ICD-10-CM | POA: Diagnosis not present

## 2020-11-18 NOTE — Progress Notes (Signed)
Daily Session Note  Patient Details  Name: Susan Sosa MRN: [id number] Date of Birth: March 17, 1940 Referring Provider:   Flowsheet Row Pulmonary Rehab from 08/12/2020 in [facility] Cardiac and Pulmonary Rehab  Referring Provider Dr. Ottie Glazier MD       Encounter Date: 11/18/2020  Check In:  Session Check In - 11/18/20 0955       Check-In   Supervising physician immediately available to respond to emergencies See telemetry face sheet for immediately available ER MD    Location [facility]-Cardiac & Pulmonary Rehab    Staff Present Birdie Sons, MPA, RN;Melissa [place], RDN, Rowe Pavy, BA, ACSM CEP, Exercise Physiologist;Kara Eliezer Bottom, MS, ASCM CEP, Exercise Physiologist    Virtual Visit No    Medication changes reported     No    Fall or balance concerns reported    No    Tobacco Cessation No Change    Warm-up and Cool-down Performed on first and last piece of equipment    Resistance Training Performed Yes    VAD Patient? No    PAD/SET Patient? No      Pain Assessment   Currently in Pain? No/denies                Social History   Tobacco Use  Smoking Status Former  Smokeless Tobacco Never    Goals Met:  Independence with exercise equipment Exercise tolerated well No report of concerns or symptoms today Strength training completed today  Goals Unmet:  Not Applicable  Comments: Pt able to follow exercise prescription today without complaint.  Will continue to monitor for progression.    Dr. Emily Filbert is Medical Director for [place].  Dr. Ottie Glazier is Medical Director for [facility] Pulmonary Rehabilitation.

## 2020-11-20 ENCOUNTER — Other Ambulatory Visit: Payer: Self-pay

## 2020-11-20 DIAGNOSIS — J8410 Pulmonary fibrosis, unspecified: Secondary | ICD-10-CM | POA: Diagnosis not present

## 2020-11-20 NOTE — Progress Notes (Signed)
Daily Session Note  Patient Details  Name: Susan Sosa MRN: [id number] Date of Birth: October 25, 1940 Referring Provider:   Flowsheet Row Pulmonary Rehab from 08/12/2020 in [facility] Cardiac and Pulmonary Rehab  Referring Provider Dr. Ottie Glazier MD       Encounter Date: 11/20/2020  Check In:  Session Check In - 11/20/20 0939       Check-In   Supervising physician immediately available to respond to emergencies See telemetry face sheet for immediately available ER MD    Location [facility]-Cardiac & Pulmonary Rehab    Staff Present Birdie Sons, MPA, RN;Melissa [place], RDN, Wilhelmina Mcardle, BS, ACSM CEP, Exercise Physiologist;Joseph Tessie Fass, [place]    Virtual Visit No    Medication changes reported     No    Fall or balance concerns reported    No    Tobacco Cessation No Change    Warm-up and Cool-down Performed on first and last piece of equipment    Resistance Training Performed Yes    VAD Patient? No    PAD/SET Patient? No      Pain Assessment   Currently in Pain? No/denies                Social History   Tobacco Use  Smoking Status Former  Smokeless Tobacco Never    Goals Met:  Independence with exercise equipment Exercise tolerated well No report of concerns or symptoms today Strength training completed today  Goals Unmet:  Not Applicable  Comments: Pt able to follow exercise prescription today without complaint.  Will continue to monitor for progression.    Dr. Emily Filbert is Medical Director for [place].  Dr. Ottie Glazier is Medical Director for [facility] Pulmonary Rehabilitation.

## 2020-11-25 ENCOUNTER — Other Ambulatory Visit: Payer: Self-pay

## 2020-11-25 ENCOUNTER — Encounter: Payer: Medicare Other | Attending: Pulmonary Disease

## 2020-11-25 DIAGNOSIS — J8410 Pulmonary fibrosis, unspecified: Secondary | ICD-10-CM | POA: Insufficient documentation

## 2020-11-25 NOTE — Progress Notes (Signed)
Daily Session Note  Patient Details  Name: Akanksha [place] MRN: [id number] Date of Birth: Jul 30, 1940 Referring Provider:   Flowsheet Row Pulmonary Rehab from 08/12/2020 in [facility] Cardiac and Pulmonary Rehab  Referring Provider Dr. Ottie Glazier MD       Encounter Date: 11/25/2020  Check In:  Session Check In - 11/25/20 1002       Check-In   Supervising physician immediately available to respond to emergencies See telemetry face sheet for immediately available ER MD    Location [facility]-Cardiac & Pulmonary Rehab    Staff Present Birdie Sons, MPA, RN;Jessica [place], MA, RCEP, CCRP, CCET;Amanda Sommer, BA, ACSM CEP, Exercise Physiologist    Virtual Visit No    Medication changes reported     No    Fall or balance concerns reported    No    Tobacco Cessation No Change    Warm-up and Cool-down Performed on first and last piece of equipment    Resistance Training Performed Yes    VAD Patient? No    PAD/SET Patient? No      Pain Assessment   Currently in Pain? No/denies                Social History   Tobacco Use  Smoking Status Former  Smokeless Tobacco Never    Goals Met:  Independence with exercise equipment Exercise tolerated well No report of concerns or symptoms today Strength training completed today  Goals Unmet:  Not Applicable  Comments: Pt able to follow exercise prescription today without complaint.  Will continue to monitor for progression.    Dr. Emily Filbert is Medical Director for [place].  Dr. Ottie Glazier is Medical Director for [facility] Pulmonary Rehabilitation.

## 2020-11-26 ENCOUNTER — Encounter: Payer: Self-pay | Admitting: *Deleted

## 2020-11-26 DIAGNOSIS — J8410 Pulmonary fibrosis, unspecified: Secondary | ICD-10-CM

## 2020-11-26 NOTE — Progress Notes (Signed)
Pulmonary Individual Treatment Plan  Patient Details  Name: Susan Sosa MRN: [id number] Date of Birth: 09-01-1940 Referring Provider:   Flowsheet Row Pulmonary Rehab from 08/12/2020 in [facility] Cardiac and Pulmonary Rehab  Referring Provider Dr. Ottie Glazier MD       Initial Encounter Date:  Flowsheet Row Pulmonary Rehab from 08/12/2020 in [facility] Cardiac and Pulmonary Rehab  Date 08/12/20       Visit Diagnosis: Pulmonary fibrosis ([place])  Patient's Home Medications on Admission:  Current Outpatient Medications:    albuterol (PROVENTIL) (2.5 MG/3ML) 0.083% nebulizer solution, Inhale into the lungs., Disp: , Rfl:    ascorbic acid (VITAMIN C) 500 MG tablet, ascorbic acid (vitamin C) 500 mg tablet   1 tablet by oral route., Disp: , Rfl:    aspirin 81 MG chewable tablet, aspirin 81 mg chewable tablet   81 mg by oral route., Disp: , Rfl:    atorvastatin (LIPITOR) 40 MG tablet, Take 1 tablet by mouth daily., Disp: , Rfl:    calcium-vitamin D (OSCAL WITH D) 500-200 MG-UNIT TABS tablet, Take 1 tablet by mouth daily., Disp: , Rfl:    cyanocobalamin 1000 MCG tablet, cyanocobalamin (vit B-12) 1,000 mcg tablet   1000 ugs by oral route., Disp: , Rfl:    esomeprazole (NEXIUM) 40 MG capsule, Take 1 capsule by mouth at bedtime., Disp: , Rfl:    hydrALAZINE (APRESOLINE) 10 MG tablet, TAKE 1 TABLET BY MOUTH NIGHTLY AS NEEDED, Disp: , Rfl:    levalbuterol (XOPENEX) 1.25 MG/3ML nebulizer solution, Inhale into the lungs., Disp: , Rfl:    losartan (COZAAR) 50 MG tablet, 1 tab by mouth daily around 5pm, Disp: , Rfl:    magnesium oxide (MAG-OX) 400 MG tablet, magnesium oxide 400 mg (241.3 mg magnesium) tablet   400 mg twice a day by oral route., Disp: , Rfl:    multivitamin-iron-minerals-folic acid (CENTRUM) chewable tablet, Chew by mouth., Disp: , Rfl:    naproxen sodium (ALEVE) 220 MG tablet, naproxen sodium 220 mg tablet   440 mg twice a day by oral route., Disp: , Rfl:    Pirfenidone (ESBRIET) 267 MG CAPS,  Take by mouth., Disp: , Rfl:    prednisoLONE acetate (PRED FORTE) 1 % ophthalmic suspension, prednisolone acetate 1 % eye drops,suspension  INSTILL 1 DROP INTO LEFT EYE 3 TIMES A DAY, Disp: , Rfl:    triamcinolone cream (KENALOG) 0.1 %, APPLY TO AFFECTED AREA TWICE A DAY, Disp: , Rfl:    triamterene-hydrochlorothiazide (MAXZIDE) 75-50 MG tablet, TAKE 1 BY MOUTH EVERY DAY., Disp: , Rfl:    verapamil (CALAN-SR) 180 MG CR tablet, Take by mouth., Disp: , Rfl:    vitamin E 180 MG (400 UNITS) capsule, vitamin E 268 mg (400 unit) capsule   400 units by oral route., Disp: , Rfl:   Past Medical History: Past Medical History:  Diagnosis Date   Hypertension     Tobacco Use: Social History   Tobacco Use  Smoking Status Former  Smokeless Tobacco Never    Labs: Recent Review Flowsheet Data   There is no flowsheet data to display.      Pulmonary Assessment Scores:  Pulmonary Assessment Scores     Row Name 08/12/20 1440         ADL UCSD   ADL Phase Entry     SOB Score total 23     Rest 0     Walk 1     Stairs 3     Bath 1  Dress 1     Shop 1       CAT Score   CAT Score 11       mMRC Score   mMRC Score 1              UCSD: Self-administered rating of dyspnea associated with activities of daily living (ADLs) 6-point scale (0 = "not at all" to 5 = "maximal or unable to do because of breathlessness")  Scoring Scores range from 0 to 120.  Minimally important difference is 5 units  CAT: CAT can identify the health impairment of COPD patients and is better correlated with disease progression.  CAT has a scoring range of zero to 40. The CAT score is classified into four groups of low (less than 10), medium (10 - 20), high (21-30) and very high (31-40) based on the impact level of disease on health status. A CAT score over 10 suggests significant symptoms.  A worsening CAT score could be explained by an exacerbation, poor medication adherence, poor inhaler technique, or  progression of COPD or comorbid conditions.  CAT MCID is 2 points  mMRC: mMRC (Modified Medical Research Council) Dyspnea Scale is used to assess the degree of baseline functional disability in patients of respiratory disease due to dyspnea. No minimal important difference is established. A decrease in score of 1 point or greater is considered a positive change.   Pulmonary Function Assessment:   Exercise Target Goals: Exercise Program Goal: Individual exercise prescription set using results from initial 6 min walk test and THRR while considering  patient's activity barriers and safety.   Exercise Prescription Goal: Initial exercise prescription builds to 30-45 minutes a day of aerobic activity, 2-3 days per week.  Home exercise guidelines will be given to patient during program as part of exercise prescription that the participant will acknowledge.  Education: Aerobic Exercise: - Group verbal and visual presentation on the components of exercise prescription. Introduces F.I.T.T principle from ACSM for exercise prescriptions.  Reviews F.I.T.T. principles of aerobic exercise including progression. Written material given at graduation. Flowsheet Row Pulmonary Rehab from 11/13/2020 in [facility] Cardiac and Pulmonary Rehab  Date 09/18/20  Educator [facility]  Instruction Review Code 1- Verbalizes Understanding       Education: Resistance Exercise: - Group verbal and visual presentation on the components of exercise prescription. Introduces F.I.T.T principle from ACSM for exercise prescriptions  Reviews F.I.T.T. principles of resistance exercise including progression. Written material given at graduation. Flowsheet Row Pulmonary Rehab from 11/13/2020 in [facility] Cardiac and Pulmonary Rehab  Date 09/25/20  Educator [facility]  Instruction Review Code 1- Verbalizes Understanding        Education: Exercise & Equipment Safety: - Individual verbal instruction and demonstration of equipment use and safety with use  of the equipment. Flowsheet Row Pulmonary Rehab from 11/13/2020 in [facility] Cardiac and Pulmonary Rehab  Education need identified 08/12/20  Date 08/12/20  Educator [place]  Instruction Review Code 1- Verbalizes Understanding       Education: Exercise Physiology & General Exercise Guidelines: - Group verbal and written instruction with models to review the exercise physiology of the cardiovascular system and associated critical values. Provides general exercise guidelines with specific guidelines to those with heart or lung disease.  Flowsheet Row Pulmonary Rehab from 11/13/2020 in [facility] Cardiac and Pulmonary Rehab  Date 11/13/20  Educator [facility]  Instruction Review Code 1- Verbalizes Understanding       Education: Flexibility, Balance, Mind/Body Relaxation: - Group verbal and visual presentation with interactive activity  on the components of exercise prescription. Introduces F.I.T.T principle from ACSM for exercise prescriptions. Reviews F.I.T.T. principles of flexibility and balance exercise training including progression. Also discusses the mind body connection.  Reviews various relaxation techniques to help reduce and manage stress (i.e. Deep breathing, progressive muscle relaxation, and visualization). Balance handout provided to take home. Written material given at graduation. Flowsheet Row Pulmonary Rehab from 11/13/2020 in [facility] Cardiac and Pulmonary Rehab  Date 10/02/20  Educator [facility]  Instruction Review Code 1- Verbalizes Understanding       Activity Barriers & Risk Stratification:  Activity Barriers & Cardiac Risk Stratification - 08/12/20 1158       Activity Barriers & Cardiac Risk Stratification   Activity Barriers Shortness of Breath;Joint Problems;Deconditioning;Back Problems;Muscular Weakness;Other (comment)    Comments Right knee- gets cortisone shots             6 Minute Walk:  6 Minute Walk     Row Name 08/12/20 1158         6 Minute Walk   Phase Initial      Distance 500 feet     Walk Time 3.5 minutes  Stopped at 3:30 due to O2 sats     # of Rest Breaks 0     MPH 1.62     METS 1.08     RPE 12     Perceived Dyspnea  2     VO2 Peak 3.78     Symptoms No     Resting HR 67 bpm     Resting BP 130/62     Resting Oxygen Saturation  97 %     Exercise Oxygen Saturation  during 6 min walk 79 %     Max Ex. HR 111 bpm     Max Ex. BP 162/68     2 Minute Post BP 134/68       Interval HR   1 Minute HR 87     2 Minute HR 101     3 Minute HR 111  Stopped at 3:30 due to O2 < 80%     Interval Heart Rate? Yes       Interval Oxygen   Interval Oxygen? Yes     Baseline Oxygen Saturation % 97 %     1 Minute Oxygen Saturation % 93 %     1 Minute Liters of Oxygen 2 L     2 Minute Oxygen Saturation % 87 %     2 Minute Liters of Oxygen 2 L     3 Minute Oxygen Saturation % 79 %     3 Minute Liters of Oxygen 2 L     4 Minute Oxygen Saturation % --  stopped     5 Minute Oxygen Saturation % --  stopped     6 Minute Oxygen Saturation % --  stopped     2 Minute Post Oxygen Saturation % 97 %     2 Minute Post Liters of Oxygen 3 L             Oxygen Initial Assessment:  Oxygen Initial Assessment - 10/14/20 1045       Home Oxygen   Home Oxygen Device Home Concentrator;Portable Concentrator    Sleep Oxygen Prescription Continuous    Liters per minute 2    Home Exercise Oxygen Prescription Continuous    Liters per minute 2    Home Resting Oxygen Prescription Continuous    Liters per minute 2    Compliance  with Home Oxygen Use Yes      Initial 6 min Walk   Oxygen Used Continuous    Liters per minute 2      Program Oxygen Prescription   Program Oxygen Prescription Continuous    Liters per minute 2    Comments Susan Sosa feels better when walking as she feels she can go longer now. She still uses PLB when needed and states her O2 never drops below 88%.      Intervention   Short Term Goals To learn and demonstrate proper pursed lip breathing techniques  or other breathing techniques. ;To learn and understand importance of maintaining oxygen saturations>88%;To learn and understand importance of monitoring SPO2 with pulse oximeter and demonstrate accurate use of the pulse oximeter.    Long  Term Goals Exhibits proper breathing techniques, such as pursed lip breathing or other method taught during program session;Maintenance of O2 saturations>88%;Verbalizes importance of monitoring SPO2 with pulse oximeter and return demonstration             Oxygen Re-Evaluation:  Oxygen Re-Evaluation     Row Name 08/14/20 0947 09/16/20 1021 11/13/20 1008         Program Oxygen Prescription   Program Oxygen Prescription Continuous Continuous Continuous     Liters per minute '2 2 3       ' Home Oxygen   Home Oxygen Device Home Concentrator;Portable Concentrator Home Concentrator;Portable Concentrator Home Concentrator;Portable Concentrator     Sleep Oxygen Prescription Continuous Continuous Continuous     Liters per minute '2 2 2     ' Home Exercise Oxygen Prescription Continuous -- Continuous     Liters per minute 2 -- 3     Home Resting Oxygen Prescription Continuous Continuous Continuous     Liters per minute 2 -- 2     Compliance with Home Oxygen Use Yes Yes Yes       Goals/Expected Outcomes   Short Term Goals To learn and demonstrate proper pursed lip breathing techniques or other breathing techniques.  To learn and demonstrate proper pursed lip breathing techniques or other breathing techniques.  To learn and demonstrate proper pursed lip breathing techniques or other breathing techniques. ;To learn and understand importance of maintaining oxygen saturations>88%;To learn and understand importance of monitoring SPO2 with pulse oximeter and demonstrate accurate use of the pulse oximeter.;To learn and exhibit compliance with exercise, home and travel O2 prescription;To learn and demonstrate proper use of respiratory medications     Long  Term Goals  Exhibits proper breathing techniques, such as pursed lip breathing or other method taught during program session Exhibits proper breathing techniques, such as pursed lip breathing or other method taught during program session Maintenance of O2 saturations>88%;Verbalizes importance of monitoring SPO2 with pulse oximeter and return demonstration;Exhibits compliance with exercise, home  and travel O2 prescription;Compliance with respiratory medication;Exhibits proper breathing techniques, such as pursed lip breathing or other method taught during program session;Demonstrates proper use of MDI's     Comments Reviewed PLB technique with pt.  Talked about how it works and it's importance in maintaining their exercise saturations.      Short: Become more profiecient at using PLB.   Long: Become independent at using PLB. Susan Sosa is still utilizing PLB when needed. She checks her O2 at home- she admits only 1 time only dropping to 88%. Susan Sosa is doing well in rehab. She is now using 3L for exercise as she feels better with her breathing on this.  She is doing well with  her PLB and feels like her breathing is getting better.  She is staying compliant with her oxygen     Goals/Expected Outcomes Short: Become more profiecient at using PLB.   Long: Become independent at using PLB. -- Short: Continue to use her oxygen for exercise and PLB  Long: Continue to stay compliant.              Oxygen Discharge (Final Oxygen Re-Evaluation):  Oxygen Re-Evaluation - 11/13/20 1008       Program Oxygen Prescription   Program Oxygen Prescription Continuous    Liters per minute 3      Home Oxygen   Home Oxygen Device Home Concentrator;Portable Concentrator    Sleep Oxygen Prescription Continuous    Liters per minute 2    Home Exercise Oxygen Prescription Continuous    Liters per minute 3    Home Resting Oxygen Prescription Continuous    Liters per minute 2    Compliance with Home Oxygen Use Yes      Goals/Expected  Outcomes   Short Term Goals To learn and demonstrate proper pursed lip breathing techniques or other breathing techniques. ;To learn and understand importance of maintaining oxygen saturations>88%;To learn and understand importance of monitoring SPO2 with pulse oximeter and demonstrate accurate use of the pulse oximeter.;To learn and exhibit compliance with exercise, home and travel O2 prescription;To learn and demonstrate proper use of respiratory medications    Long  Term Goals Maintenance of O2 saturations>88%;Verbalizes importance of monitoring SPO2 with pulse oximeter and return demonstration;Exhibits compliance with exercise, home  and travel O2 prescription;Compliance with respiratory medication;Exhibits proper breathing techniques, such as pursed lip breathing or other method taught during program session;Demonstrates proper use of MDI's    Comments Susan Sosa is doing well in rehab. She is now using 3L for exercise as she feels better with her breathing on this.  She is doing well with her PLB and feels like her breathing is getting better.  She is staying compliant with her oxygen    Goals/Expected Outcomes Short: Continue to use her oxygen for exercise and PLB  Long: Continue to stay compliant.             Initial Exercise Prescription:  Initial Exercise Prescription - 08/12/20 1500       Date of Initial Exercise RX and Referring Provider   Date 08/12/20    Referring Provider Dr. Lanney Gins, Luvenia Heller MD      Treadmill   MPH 1.5    Grade 0    Minutes 15    METs 2.15      Recumbant Bike   Level 1    RPM 60    Watts 10    Minutes 15    METs 1.08      NuStep   Level 1    SPM 80    Minutes 15    METs 1.08      T5 Nustep   Level 1    SPM 80    Minutes 15    METs 1.08      Prescription Details   Frequency (times per week) 2    Duration Progress to 30 minutes of continuous aerobic without signs/symptoms of physical distress      Intensity   THRR 40-80% of Max Heartrate  95-124    Ratings of Perceived Exertion 11-13    Perceived Dyspnea 0-4      Progression   Progression Continue to progress workloads to maintain intensity without signs/symptoms of physical  distress.      Resistance Training   Training Prescription Yes    Weight 3 lb    Reps 10-15             Perform Capillary Blood Glucose checks as needed.  Exercise Prescription Changes:   Exercise Prescription Changes     Row Name 08/12/20 1500 08/27/20 1100 09/11/20 0900 09/23/20 1400 10/06/20 1000     Response to Exercise   Blood Pressure (Admit) 130/62 128/82 118/64 136/74 142/80   Blood Pressure (Exercise) 162/68 132/74 132/62 -- --   Blood Pressure (Exit) 134/66 122/60 104/64 104/58 110/64   Heart Rate (Admit) 67 bpm 71 bpm 74 bpm 75 bpm 67 bpm   Heart Rate (Exercise) 111 bpm 99 bpm 101 bpm 101 bpm 103 bpm   Heart Rate (Exit) 72 bpm 82 bpm 85 bpm 86 bpm 81 bpm   Oxygen Saturation (Admit) 97 % 96 % 92 % 98 % 98 %   Oxygen Saturation (Exercise) 79 % 94 % 88 % 93 % 90 %   Oxygen Saturation (Exit) 97 % 98 % -- 99 % 98 %   Rating of Perceived Exertion (Exercise) '12 12 13 13 13   ' Perceived Dyspnea (Exercise) 2 1 0 2 0   Symptoms none none none SOB none   Comments walk test results -- -- -- --   Duration -- Progress to 30 minutes of  aerobic without signs/symptoms of physical distress Progress to 30 minutes of  aerobic without signs/symptoms of physical distress Continue with 30 min of aerobic exercise without signs/symptoms of physical distress. Continue with 30 min of aerobic exercise without signs/symptoms of physical distress.   Intensity -- THRR unchanged THRR unchanged THRR unchanged THRR unchanged     Progression   Progression -- Continue to progress workloads to maintain intensity without signs/symptoms of physical distress. Continue to progress workloads to maintain intensity without signs/symptoms of physical distress. Continue to progress workloads to maintain intensity without  signs/symptoms of physical distress. Continue to progress workloads to maintain intensity without signs/symptoms of physical distress.   Average METs -- 1.8 2.06 2.3 2.38     Resistance Training   Training Prescription -- Yes Yes Yes Yes   Weight -- 3 lb 3 lb 3 lb 3 lb   Reps -- 10-15 10-15 10-15 10-15     Interval Training   Interval Training -- No No No No     Treadmill   MPH -- 1.5 1.7 1.8 1.7   Grade -- 0 2.5 1.5 2.5   Minutes -- '15 15 15 15   ' METs -- 2.15 2.89 2.75 2.89     NuStep   Level -- 1 3 -- 3   Minutes -- 15 15 -- 15   METs -- 1.8 1.7 -- 1.7     Arm Ergometer   Level -- -- -- -- 2   Minutes -- -- -- -- 15   METs -- -- -- -- 1     T5 Nustep   Level -- -- -- 3 --   Minutes -- -- -- 15 --   METs -- -- -- 1.7 --     Home Exercise Plan   Plans to continue exercise at -- -- [employer] (comment)  walking at the mall; [employer] (comment)  walking at the mall; [employer] (comment)  walking at the mall; staff videos   Frequency -- -- Add 2 additional days to program exercise sessions.  Add 2 additional days to program exercise sessions. Add 2 additional days to program exercise sessions.   Initial Home Exercises Provided -- -- 09/04/20 09/04/20 09/04/20    Row Name 10/22/20 1400 11/03/20 1400 11/19/20 1300         Response to Exercise   Blood Pressure (Admit) 138/58 126/78 112/60     Blood Pressure (Exit) 112/60 114/62 102/60     Heart Rate (Admit) 69 bpm 54 bpm 84 bpm     Heart Rate (Exercise) 104 bpm 97 bpm 112 bpm     Heart Rate (Exit) 92 bpm 82 bpm 94 bpm     Oxygen Saturation (Admit) 95 % 96 % 98 %     Oxygen Saturation (Exercise) 97 % 94 % 91 %     Oxygen Saturation (Exit) 98 % 98 % 98 %     Rating of Perceived Exertion (Exercise) '13 13 13     ' Perceived Dyspnea (Exercise) 0 1 0     Symptoms none none none     Duration Continue with 30 min of aerobic exercise without signs/symptoms of physical distress.  Continue with 30 min of aerobic exercise without signs/symptoms of physical distress. Continue with 30 min of aerobic exercise without signs/symptoms of physical distress.     Intensity THRR unchanged THRR unchanged THRR unchanged       Progression   Progression Continue to progress workloads to maintain intensity without signs/symptoms of physical distress. Continue to progress workloads to maintain intensity without signs/symptoms of physical distress. Continue to progress workloads to maintain intensity without signs/symptoms of physical distress.     Average METs 2.28 2.27 2.5       Resistance Training   Training Prescription Yes Yes Yes     Weight 3 lb 4 lb 4 lb     Reps 10-15 10-15 10-15       Interval Training   Interval Training No No No       Treadmill   MPH 1.8 1.9 1.5     Grade 2.5 2.5 3     Minutes '15 15 15     ' METs 3 3.11 2.77       NuStep   Level -- 6 --     Minutes -- 15 --     METs -- 1.8 --       T5 Nustep   Level '3 3 3     ' Minutes '15 15 15     ' METs 1.8 1.8 --       Home Exercise Plan   Plans to continue exercise at [employer] (comment)  walking at the mall; [employer] (comment)  walking at the mall; [employer] (comment)  walking at the mall; staff videos     Frequency Add 2 additional days to program exercise sessions. Add 2 additional days to program exercise sessions. Add 2 additional days to program exercise sessions.     Initial Home Exercises Provided 09/04/20 09/04/20 09/04/20              Exercise Comments:   Exercise Comments     Row Name 08/14/20 0946           Exercise Comments First full day of exercise!  Patient was oriented to gym and equipment including functions, settings, policies, and procedures.  Patient's individual exercise prescription and treatment plan were reviewed.  All starting workloads were established based on the results of the 6 minute walk test done at  initial  orientation visit.  The plan for exercise progression was also introduced and progression will be customized based on patient's performance and goals.                Exercise Goals and Review:   Exercise Goals     Row Name 08/12/20 1509             Exercise Goals   Increase Physical Activity Yes       Intervention Provide advice, education, support and counseling about physical activity/exercise needs.;Develop an individualized exercise prescription for aerobic and resistive training based on initial evaluation findings, risk stratification, comorbidities and participant's personal goals.       Expected Outcomes Long Term: Add in home exercise to make exercise part of routine and to increase amount of physical activity.;Short Term: Attend rehab on a regular basis to increase amount of physical activity.;Long Term: Exercising regularly at least 3-5 days a week.       Increase Strength and Stamina Yes       Intervention Provide advice, education, support and counseling about physical activity/exercise needs.;Develop an individualized exercise prescription for aerobic and resistive training based on initial evaluation findings, risk stratification, comorbidities and participant's personal goals.       Expected Outcomes Short Term: Increase workloads from initial exercise prescription for resistance, speed, and METs.;Short Term: Perform resistance training exercises routinely during rehab and add in resistance training at home;Long Term: Improve cardiorespiratory fitness, muscular endurance and strength as measured by increased METs and functional capacity (6MWT)       Able to understand and use rate of perceived exertion (RPE) scale Yes       Intervention Provide education and explanation on how to use RPE scale       Expected Outcomes Short Term: Able to use RPE daily in rehab to express subjective intensity level;Long Term:  Able to use RPE to guide intensity level when exercising  independently       Able to understand and use Dyspnea scale Yes       Intervention Provide education and explanation on how to use Dyspnea scale       Expected Outcomes Short Term: Able to use Dyspnea scale daily in rehab to express subjective sense of shortness of breath during exertion;Long Term: Able to use Dyspnea scale to guide intensity level when exercising independently       Knowledge and understanding of Target Heart Rate Range (THRR) Yes       Intervention Provide education and explanation of THRR including how the numbers were predicted and where they are located for reference       Expected Outcomes Short Term: Able to state/look up THRR;Long Term: Able to use THRR to govern intensity when exercising independently;Short Term: Able to use daily as guideline for intensity in rehab       Able to check pulse independently Yes       Intervention Provide education and demonstration on how to check pulse in carotid and radial arteries.;Review the importance of being able to check your own pulse for safety during independent exercise       Expected Outcomes Short Term: Able to explain why pulse checking is important during independent exercise;Long Term: Able to check pulse independently and accurately       Understanding of Exercise Prescription Yes       Intervention Provide education, explanation, and written materials on patient's individual exercise prescription       Expected Outcomes  Short Term: Able to explain program exercise prescription;Long Term: Able to explain home exercise prescription to exercise independently                Exercise Goals Re-Evaluation :  Exercise Goals Re-Evaluation     Row Name 08/14/20 0946 08/27/20 1132 09/04/20 1016 09/11/20 0916 09/23/20 1439     Exercise Goal Re-Evaluation   Exercise Goals Review Understanding of Exercise Prescription;Able to check pulse independently;Knowledge and understanding of Target Heart Rate Range (THRR);Able to  understand and use Dyspnea scale;Able to understand and use rate of perceived exertion (RPE) scale Increase Physical Activity;Increase Strength and Stamina Increase Physical Activity;Increase Strength and Stamina;Understanding of Exercise Prescription Increase Physical Activity;Increase Strength and Stamina;Understanding of Exercise Prescription Increase Physical Activity;Increase Strength and Stamina   Comments Reviewed RPE and dyspnea scales, THR and program prescription with pt today.  Pt voiced understanding and was given a copy of goals to take home. Susan Sosa is doing well in her first sessions.  Lowest oxygen is 93% during exercise.  Staff will monitor progress. Reviewed home exercise with pt today.  Pt plans to walk for exercise. She already walks the mall about 2-3x/ week. She is thinking of joining the [employer]. Reviewed THR, pulse, RPE, sign and symptoms, pulse oximetery and when to call 911 or MD.  Also discussed weather considerations and indoor options.  Pt voiced understanding. Susan Sosa is doing well in rehab. She has increased her treadmill to 1.7 speed and 2.5% for the incline and is already to level 3 on the Nustep. Will continue to monitor progression. Susan Sosa continues to do well. She attends consistently and sats stay in the 90s.  Staff will encourage her to try 4 lb weights.   Expected Outcomes Short: Use RPE daily to regulate intensity. Long: Follow program prescription in THR. Short:  attend consistently Long:  build overall stamina Short: Check HR and O2 during exercise Long: Continue to exercise independently at appropriate prescription Short: Continue to increase load on treadmill as tolerated Long: Continue to increase overall MET level Short: try 4 lb Long :  build overall stamina    Row Name 10/06/20 1045 10/14/20 1023 10/22/20 1449 11/03/20 1409 11/13/20 1002     Exercise Goal Re-Evaluation   Exercise Goals Review Increase Physical Activity;Increase Strength and Stamina Increase Physical  Activity;Increase Strength and Stamina;Able to check pulse independently Increase Physical Activity;Increase Strength and Stamina Increase Physical Activity;Increase Strength and Stamina;Understanding of Exercise Prescription Increase Physical Activity;Increase Strength and Stamina;Understanding of Exercise Prescription   Comments Susan Sosa is doing well in rehab. She has increased her load on the treadmill to 1.7 speed and 2.5 % incline.  RPEs have been appropriate through her exercise sessions. Her handweights should be encouraged to increase to 4 lbs. Susan Sosa is walking for about 40 minutes 3x/week on top of 2 days of rehab. Her and her husband walk around the mall. She is still checking her HR and O2 and states her O2 never drops below 88%. She staed that she feels she can walk longer than she used to prior to starting the program. Susan Sosa tried 4 lb weights today for strength work.  She attends consistently and reaches THR range.  We will continue to monitor progress. Susan Sosa has been doing well in rehab.  She is has gotten up to 1.9 mph on the treadmill and then comes back down.  We will continue to monitor her progress. Susan Sosa continues to do well in rehab. She is walking at home and using her  pedal machine at home on her off days.  She does feel like her stamina is getting better.  She asked if we were seeing any improvement so reviewed her progression.   Expected Outcomes Short: Continue to increase load on TM Long: Continue to increase overall MET level Short: Continue watching O2 and HR during home exercise Long: Continue to exercise independently at appropriate prescription Short:  maintain consistent attendance Long:  continue to build stamina Short: Improve time at 1.9 mph Long; continue to improve stamina Short: COnitnue to exercise on off days Long: Continue to improve stamina.    Susan Sosa Name 11/19/20 1338             Exercise Goal Re-Evaluation   Exercise Goals Review Increase Physical Activity;Increase  Strength and Stamina       Comments Susan Sosa continues to do well. She varies her speed and grade on TM.  She reaches THR and oxygen sats stay in 90s on 3 L.       Expected Outcomes Short; contineu to attend consistently Long: build overall stamina                Discharge Exercise Prescription (Final Exercise Prescription Changes):  Exercise Prescription Changes - 11/19/20 1300       Response to Exercise   Blood Pressure (Admit) 112/60    Blood Pressure (Exit) 102/60    Heart Rate (Admit) 84 bpm    Heart Rate (Exercise) 112 bpm    Heart Rate (Exit) 94 bpm    Oxygen Saturation (Admit) 98 %    Oxygen Saturation (Exercise) 91 %    Oxygen Saturation (Exit) 98 %    Rating of Perceived Exertion (Exercise) 13    Perceived Dyspnea (Exercise) 0    Symptoms none    Duration Continue with 30 min of aerobic exercise without signs/symptoms of physical distress.    Intensity THRR unchanged      Progression   Progression Continue to progress workloads to maintain intensity without signs/symptoms of physical distress.    Average METs 2.5      Resistance Training   Training Prescription Yes    Weight 4 lb    Reps 10-15      Interval Training   Interval Training No      Treadmill   MPH 1.5    Grade 3    Minutes 15    METs 2.77      T5 Nustep   Level 3    Minutes 15      Home Exercise Plan   Plans to continue exercise at [employer] (comment)   walking at the mall; staff videos   Frequency Add 2 additional days to program exercise sessions.    Initial Home Exercises Provided 09/04/20             Nutrition:  Target Goals: Understanding of nutrition guidelines, daily intake of sodium <1560m, cholesterol <2057m calories 30% from fat and 7% or less from saturated fats, daily to have 5 or more servings of fruits and vegetables.  Education: All About Nutrition: -Group instruction provided by verbal, written material, interactive activities, discussions, models, and  posters to present general guidelines for heart healthy nutrition including fat, fiber, MyPlate, the role of sodium in heart healthy nutrition, utilization of the nutrition label, and utilization of this knowledge for meal planning. Follow up email sent as well. Written material given at graduation. [facility]  Date  10/16/20  Educator [place]  Instruction Review Code 1- Verbalizes Understanding       Biometrics:  Pre Biometrics - 08/12/20 1157       Pre Biometrics   Height 5' (1.524 m)    Weight 145 lb 11.2 oz (66.1 kg)    BMI (Calculated) 28.46    Single Leg Stand 10.03 seconds              Nutrition Therapy Plan and Nutrition Goals:  Nutrition Therapy & Goals - 08/26/20 0920       Nutrition Therapy   Diet Heart healthy, low Na, pulmonary MNT    Drug/Food Interactions Statins/Certain Fruits    Protein (specify units) 80g    Fiber 25 grams    Whole Grain Foods 3 servings    Saturated Fats 12 max. grams    Fruits and Vegetables 8 servings/day    Sodium 1.5 grams      Personal Nutrition Goals   Nutrition Goal ST: try soymilk, add avocado or peanut butter to lunch, swap out protein shake (Ensure, Boost, Orgain) LT: maintain weight    Comments B: cereal (frosted flakes and mini wheats and almond milk) or sausage biscuit S: premier protein L: not hungry - she thinks this is due to her medication. 1-2 eggs and bread D: meat (chicken, steak, limits her pork intake (she feels she is allergic) - grilled)and some vegetables (peas, pinto beans, beets, corn, small amounts of potato - if she does it is a sweet potato, spinach, turnip greens - little grapeseed oil) S: apple. Drinks: water.  She will cook beans from dried. Does not cook with salt. She has a lower appetite - has been for years. She has lost 30 lbs over 3-4 months (2 years ago) - gained 10 pounds back. She drinks premier protein which has recently been  recalled - provided information on lot numbers and encourgaed her to check her products - suggested swapping and trying Ensure, Orgain, or Boost. suggested swapping almond milk for fortified soymilk and adding avocados or peanut butter to her lunch. Discussed heart healthy eating and pulmonary friendly eating.      Intervention Plan   Intervention Prescribe, educate and counsel regarding individualized specific dietary modifications aiming towards targeted core components such as weight, hypertension, lipid management, diabetes, heart failure and other comorbidities.;Nutrition handout(s) given to patient.    Expected Outcomes Short Term Goal: Understand basic principles of dietary content, such as calories, fat, sodium, cholesterol and nutrients.;Short Term Goal: A plan has been developed with personal nutrition goals set during dietitian appointment.;Long Term Goal: Adherence to prescribed nutrition plan.             Nutrition Assessments:  MEDIFICTS Score Key: ?70 Need to make dietary changes  40-70 Heart Healthy Diet ? 40 Therapeutic Level Cholesterol Diet  Flowsheet Row Pulmonary Rehab from 08/12/2020 in [facility] Card[facility] and Pulmonary Rehab  Picture Your Plate Total Score on Admission 66      Picture Your Plate Scores: <25 Unhealthy dietary pattern with much room for improvement. 41-50 Dietary pattern unlikely to meet recommendations for good health and room for improvement. 51-60 More healthful dietary pattern, with some room for improvement.  >60 Healthy dietary pattern, although there may be some specific behaviors that could be improved.   Nutrition Goals Re-Evaluation:  Nutrition Goals Re-Evaluation     [place] Name 09/16/20 1028 10/14/20 1025 11/13/20 1005         Goals   Nutrition Goal ST: try soymilk,  add avocado or peanut butter to lunch, swap out protein shake (Ensure, Boost, Orgain) LT: maintain weight ST: try soymilk, add avocado or peanut butter to lunch, swap out  protein shake (Ensure, Boost, Orgain) LT: maintain weight Short: Continue to follow RD recommendations Long: Continue eating a pulmonary-based diet     Comment Susan Sosa is doing well with her changes. Susan Sosa has been drinking Atkins shake once per day to help increase her protein intake. She has been adding peanut butter to her crackers as well as another change. Susan Sosa is doing well. She is drinking boost almost daily for extra protein in her diet. She is also adding  peanut butter on celery or crackers. She finds them filling. She weighs herself at home and her weight has been maintained well. Khilynn is doing well with her diet.  She is still trying to get more protein and is now using both Boost and Ensure to help.  Otherwise trying to eat balanced and get her fruits and vegetables.     Expected Outcome Short: Continue to follow RD goals Long: Continue to eat a balanced- pulmonary based diet Short: Continue to follow RD recommendations Long: Continue eating a pulmonary-based diet Short: Continue to boost protein Long: Continued balance diet.              Nutrition Goals Discharge (Final Nutrition Goals Re-Evaluation):  Nutrition Goals Re-Evaluation - 11/13/20 1005       Goals   Nutrition Goal Short: Continue to follow RD recommendations Long: Continue eating a pulmonary-based diet    Comment Susan Sosa is doing well with her diet.  She is still trying to get more protein and is now using both Boost and Ensure to help.  Otherwise trying to eat balanced and get her fruits and vegetables.    Expected Outcome Short: Continue to boost protein Long: Continued balance diet.             Psychosocial: Target Goals: Acknowledge presence or absence of significant depression and/or stress, maximize coping skills, provide positive support system. Participant is able to verbalize types and ability to use techniques and skills needed for reducing stress and depression.   Education: Stress, Anxiety, and Depression -  Group verbal and visual presentation to define topics covered.  Reviews how body is impacted by stress, anxiety, and depression.  Also discusses healthy ways to reduce stress and to treat/manage anxiety and depression.  Written material given at graduation. Flowsheet Row Pulmonary Rehab from 11/13/2020 in [facility] Cardiac and Pulmonary Rehab  Date 09/04/20  Educator [facility]  Instruction Review Code 1- [employer] Understanding       Education: Sleep Hygiene -Provides group verbal and written instruction about how sleep can affect your health.  Define sleep hygiene, discuss sleep cycles and impact of sleep habits. Review good sleep hygiene tips.    Initial Review & Psychosocial Screening:  Initial Psych Review & Screening - 08/04/20 1545       Initial Review   Current issues with Current Sleep Concerns      Family Dynamics   Good Support System? Yes   husband     Barriers   Psychosocial barriers to participate in program There are no identifiable barriers or psychosocial needs.;The patient should benefit from training in stress management and relaxation.      Screening Interventions   Interventions Encouraged to exercise;Provide feedback about the scores to participant;To provide support and resources with identified psychosocial needs    Expected Outcomes Short Term goal: Utilizing psychosocial  counselor, staff and physician to assist with identification of specific Stressors or current issues interfering with healing process. Setting desired goal for each stressor or current issue identified.;Long Term Goal: Stressors or current issues are controlled or eliminated.;Short Term goal: Identification and review with participant of any Quality of Life or Depression concerns found by scoring the questionnaire.;Long Term goal: The participant improves quality of Life and PHQ9 Scores as seen by post scores and/or verbalization of changes             Quality of Life Scores:  Scores of 19 and  below usually indicate a poorer quality of life in these areas.  A difference of  2-3 points is a clinically meaningful difference.  A difference of 2-3 points in the total score of the Quality of Life Index has been associated with significant improvement in overall quality of life, self-image, physical symptoms, and general health in studies assessing change in quality of life.  PHQ-9: Recent Review Flowsheet Data     Depression screen [facility] 2/9 08/12/2020   Decreased Interest 0   Down, Depressed, Hopeless 0   PHQ - 2 Score 0   Altered sleeping 0   Tired, decreased energy 1   Change in appetite 0   Feeling bad or failure about yourself  0   Trouble concentrating 0   Moving slowly or fidgety/restless 0   Suicidal thoughts 0   PHQ-9 Score 1   Difficult doing work/chores Not difficult at all      Interpretation of Total Score  Total Score Depression Severity:  1-4 = Minimal depression, 5-9 = Mild depression, 10-14 = Moderate depression, 15-19 = Moderately severe depression, 20-27 = Severe depression   Psychosocial Evaluation and Intervention:  Psychosocial Evaluation - 08/04/20 1546       Psychosocial Evaluation & Interventions   Interventions Encouraged to exercise with the program and follow exercise prescription    Comments Susan 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 She has been dealing with this diagnosis for the past two years, but is proud that the doctor said she seems to be holding her own. She reports that she already has lost 30% of her lung capacity and is working hard to maintain her current lung function which is why she is motivated to come to rehab. She walks 2-3 times a week at [facility] and is diligent about her breathing exercises and medications. She wears oxygen as well. Her husband is her main support system. She does not report any stressors currently as she feels like she is handling her current health status appropriately. She has noticed  in the last 6 months that her sleep has been altered, where she used to fall asleep quickly now it is taking her over an hour to fall back asleep after waking to use the bathroom.    Expected Outcomes Short: attend pulmonary rehab for education and exercise. Long: develop and maintain positive self care habits.    Continue Psychosocial Services  Follow up required by staff             Psychosocial Re-Evaluation:  Psychosocial Re-Evaluation     Susan Sosa Name 09/16/20 1027 10/14/20 1029 11/13/20 1004         Psychosocial Re-Evaluation   Current issues with Current Sleep Concerns Current Sleep Concerns Current Sleep Concerns     Comments Susan Sosa is holding up well mentally. Her sleep isnt the best when she gets up to use the restroom throughout the  night. She does not have any desire to ask her doctor for any sleep aide. She does have good support from family and husband specifically Susan Sosa is still doing well. She still gets up 2-3x/night, she does not want to take any other medications. She is currently not taking any medications for sleep or depression and states she has no big stressors at this time. Her and her husband walk a lot at the mall to keep them busy. Susan Sosa is doing well in rehab.  She denies any major stressors currently.  Her sleep issues is getting better and she thinks it's probabaly coming form the exercise.     Expected Outcomes Short: Continue attendance with rehab Long: Maintain positive attitude Short: Continue walking at the mall for positive attitude Long: Continue to utilize exercise for stress management Short: Continue to exercise for mental boost and sleep quality Long: Continue to stay positive.     Interventions Encouraged to attend Cardiac Rehabilitation for the exercise Encouraged to attend Pulmonary Rehabilitation for the exercise Encouraged to attend Pulmonary Rehabilitation for the exercise     Continue Psychosocial Services  Follow up required by staff Follow up required  by staff --              Psychosocial Discharge (Final Psychosocial Re-Evaluation):  Psychosocial Re-Evaluation - 11/13/20 1004       Psychosocial Re-Evaluation   Current issues with Current Sleep Concerns    Comments Susan Sosa is doing well in rehab.  She denies any major stressors currently.  Her sleep issues is getting better and she thinks it's probabaly coming form the exercise.    Expected Outcomes Short: Continue to exercise for mental boost and sleep quality Long: Continue to stay positive.    Interventions Encouraged to attend Pulmonary Rehabilitation for the exercise             Education: Education Goals: Education classes will be provided on a weekly basis, covering required topics. Participant will state understanding/return demonstration of topics presented.  Learning Barriers/Preferences:   General Pulmonary Education Topics:  Infection Prevention: - Provides verbal and written material to individual with discussion of infection control including proper hand washing and proper equipment cleaning during exercise session. Flowsheet Row Pulmonary Rehab from 11/13/2020 in [facility] Cardiac and Pulmonary Rehab  Education need identified 08/12/20  Date 08/12/20  Educator [place]  Instruction Review Code 1- Verbalizes Understanding       Falls Prevention: - Provides verbal and written material to individual with discussion of falls prevention and safety. Flowsheet Row Pulmonary Rehab from 11/13/2020 in [facility] Cardiac and Pulmonary Rehab  Education need identified 08/12/20  Date 08/12/20  Educator [place]  Instruction Review Code 1- Verbalizes Understanding       Chronic Lung Disease Review: - Group verbal instruction with posters, models, PowerPoint presentations and videos,  to review new updates, new respiratory medications, new advancements in procedures and treatments. Providing information on websites and "800" numbers for continued self-education. Includes information  about supplement oxygen, available portable oxygen systems, continuous and intermittent flow rates, oxygen safety, concentrators, and Medicare reimbursement for oxygen. Explanation of Pulmonary Drugs, including class, frequency, complications, importance of spacers, rinsing mouth after steroid MDI's, and proper cleaning methods for nebulizers. Review of basic lung anatomy and physiology related to function, structure, and complications of lung disease. Review of risk factors. Discussion about methods for diagnosing sleep apnea and types of masks and machines for OSA. Includes a review of the use of types of environmental controls: home  humidity, furnaces, filters, dust mite/pet prevention, HEPA vacuums. Discussion about weather changes, air quality and the benefits of nasal washing. Instruction on Warning signs, infection symptoms, calling MD promptly, preventive modes, and value of vaccinations. Review of effective airway clearance, coughing and/or vibration techniques. Emphasizing that all should Create an Action Plan. Written material given at graduation. Flowsheet Row Pulmonary Rehab from 11/13/2020 in [facility] Cardiac and Pulmonary Rehab  Date 08/28/20  Educator [facility]  Instruction Review Code 1- Verbalizes Understanding       AED/CPR: - Group verbal and written instruction with the use of models to demonstrate the basic use of the AED with the basic ABC's of resuscitation.    Anatomy and Cardiac Procedures: - Group verbal and visual presentation and models provide information about basic cardiac anatomy and function. Reviews the testing methods done to diagnose heart disease and the outcomes of the test results. Describes the treatment choices: Medical Management, Angioplasty, or Coronary Bypass Surgery for treating various heart conditions including Myocardial Infarction, Angina, Valve Disease, and Cardiac Arrhythmias.  Written material given at graduation. Flowsheet Row Pulmonary Rehab from  11/13/2020 in [facility] Cardiac and Pulmonary Rehab  Date 09/25/20  Educator [place]  Instruction Review Code 1- Verbalizes Understanding       Medication Safety: - Group verbal and visual instruction to review commonly prescribed medications for heart and lung disease. Reviews the medication, class of the drug, and side effects. Includes the steps to properly store meds and maintain the prescription regimen.  Written material given at graduation. Flowsheet Row Pulmonary Rehab from 11/13/2020 in [facility] Cardiac and Pulmonary Rehab  Date 08/14/20  Educator [place]  Instruction Review Code 1- Verbalizes Understanding       Other: -Provides group and verbal instruction on various topics (see comments)   Knowledge Questionnaire Score:  Knowledge Questionnaire Score - 08/12/20 1443       Knowledge Questionnaire Score   Pre Score 15/18: Oxygen saturation, Albuterol, Oxygen flame              Core Components/Risk Factors/Patient Goals at Admission:  Personal Goals and Risk Factors at Admission - 08/12/20 1508       Core Components/Risk Factors/Patient Goals on Admission    Weight Management Yes;Weight Maintenance    Intervention Weight Management: Develop a combined nutrition and exercise program designed to reach desired caloric intake, while maintaining appropriate intake of nutrient and fiber, sodium and fats, and appropriate energy expenditure required for the weight goal.;Weight Management: Provide education and appropriate resources to help participant work on and attain dietary goals.;Weight Management/Obesity: Establish reasonable short term and long term weight goals.    Admit Weight 145 lb (65.8 kg)    Goal Weight: Short Term 145 lb (65.8 kg)    Goal Weight: Long Term 145 lb (65.8 kg)    Expected Outcomes Short Term: Continue to assess and modify interventions until short term weight is achieved;Weight Maintenance: Understanding of the daily nutrition guidelines, which includes 25-35%  calories from fat, 7% or less cal from saturated fats, less than 231m cholesterol, less than 1.5gm of sodium, & 5 or more servings of fruits and vegetables daily;Understanding recommendations for meals to include 15-35% energy as protein, 25-35% energy from fat, 35-60% energy from carbohydrates, less than 2020mof dietary cholesterol, 20-35 gm of total fiber daily;Understanding of distribution of calorie intake throughout the day with the consumption of 4-5 meals/snacks    Improve shortness of breath with ADL's Yes    Intervention Provide education, individualized exercise plan  and daily activity instruction to help decrease symptoms of SOB with activities of daily living.    Expected Outcomes Short Term: Improve cardiorespiratory fitness to achieve a reduction of symptoms when performing ADLs;Long Term: Be able to perform more ADLs without symptoms or delay the onset of symptoms    Hypertension Yes    Intervention Provide education on lifestyle modifcations including regular physical activity/exercise, weight management, moderate sodium restriction and increased consumption of fresh fruit, vegetables, and low fat dairy, alcohol moderation, and smoking cessation.;Monitor prescription use compliance.    Expected Outcomes Short Term: Continued assessment and intervention until BP is < 140/46m HG in hypertensive participants. < 130/818mHG in hypertensive participants with diabetes, heart failure or chronic kidney disease.;Long Term: Maintenance of blood pressure at goal levels.    Lipids Yes    Intervention Provide education and support for participant on nutrition & aerobic/resistive exercise along with prescribed medications to achieve LDL <7018mHDL >58m73m  Expected Outcomes Short Term: Participant states understanding of desired cholesterol values and is compliant with medications prescribed. Participant is following exercise prescription and nutrition guidelines.;Long Term: Cholesterol controlled  with medications as prescribed, with individualized exercise RX and with personalized nutrition plan. Value goals: LDL < 70mg34mL > 40 mg.             Education:Diabetes - Individual verbal and written instruction to review signs/symptoms of diabetes, desired ranges of glucose level fasting, after meals and with exercise. Acknowledge that pre and post exercise glucose checks will be done for 3 sessions at entry of program.   Know Your Numbers and Heart Failure: - Group verbal and visual instruction to discuss disease risk factors for cardiac and pulmonary disease and treatment options.  Reviews associated critical values for Overweight/Obesity, Hypertension, Cholesterol, and Diabetes.  Discusses basics of heart failure: signs/symptoms and treatments.  Introduces Heart Failure Zone chart for action plan for heart failure.  Written material given at graduation. Flowsheet Row [facility]monary [facility]  Date 08/21/20  Educator SB  Instruction Review Code 1- Verbalizes Understanding       Core Components/Risk Factors/Patient Goals Review:   Goals and Risk Factor Review     Row Name 09/16/20 1024 10/14/20 1026 11/13/20 1006         Core Components/Risk Factors/Patient Goals Review   Personal Goals Review Weight Management/Obesity;Hypertension;Improve shortness of breath with ADL's Weight Management/Obesity;Hypertension;Improve shortness of breath with ADL's Weight Management/Obesity;Hypertension;Improve shortness of breath with ADL's;Increase knowledge of respiratory medications and ability to use respiratory devices properly.     Review Susan Sosa Susan Sosa she lost weight a little bit and will continue to check it at home and make note of any abnormal changes. Checks BP at home typically runs around 130s/70s and fairly stable at rehab. She is taking all medications as prescribed. Breathing still feels the same so far but she recently was diagnosed with  COVID. She is hoping she will feel better the more she recovers. Susan Sosa Annalissel checks her BP at home and is having problems with her BP increasing at night to around 160-1315-176Holic.  She talked to her doctor and they increased her medication several weeks ago but unfortunately she hasn't seen a change yet. She states her BP during the day is stable..She goes back to see her doctor in a couple of weeks to follow up. She plans to call them before that. In regards to her breathing, At home, she feels she can walk more  at home than she used to before starting the program. She is pleased thus far. Lorette is doing well in rehab.  Her weight is holding steady.  Her pressures have gotten better and are not going up at night still.  She is doing well with her meds now.  Her pulmonary meds are doing well too.  She notes that her shortness of breath has started to improve as she is able to do more.     Expected Outcomes Short: Continue to watch weight at home Long: Continue to manage lifestyle risk factors Short: Check with doctor on BP and keep monitoring Long: Maintain stable blood pressure throughout the day Short: Continue to keep close eye on pressures Long: Continue to monitor risk factors.              Core Components/Risk Factors/Patient Goals at Discharge (Final Review):   Goals and Risk Factor Review - 11/13/20 1006       Core Components/Risk Factors/Patient Goals Review   Personal Goals Review Weight Management/Obesity;Hypertension;Improve shortness of breath with ADL's;Increase knowledge of respiratory medications and ability to use respiratory devices properly.    Review Ainhoa is doing well in rehab.  Her weight is holding steady.  Her pressures have gotten better and are not going up at night still.  She is doing well with her meds now.  Her pulmonary meds are doing well too.  She notes that her shortness of breath has started to improve as she is able to do more.    Expected Outcomes Short: Continue  to keep close eye on pressures Long: Continue to monitor risk factors.             ITP Comments:  ITP Comments     Row Name 08/04/20 1557 08/12/20 1141 08/14/20 0946 08/26/20 1014 09/03/20 0738   ITP Comments Initial telephone orientation completed. Diangosis can be found in [facility] 6/24. EP orientation scheduled for Tuesday 7/19 at 10am. Completed 6MWT and gym orientation. Initial ITP created and sent for review to Dr. Ottie Glazier, Medical Director. First full day of exercise!  Patient was oriented to gym and equipment including functions, settings, policies, and procedures.  Patient's individual exercise prescription and treatment plan were reviewed.  All starting workloads were established based on the results of the 6 minute walk test done at initial orientation visit.  The plan for exercise progression was also introduced and progression will be customized based on patient's performance and goals. Completed initial RD evaluation 30 Day review completed. Medical Director ITP review done, changes made as directed, and signed approval by Medical Director.    [place] Name 09/11/20 0915 10/01/20 4163 10/29/20 0931 11/26/20 0721     ITP Comments Aydan tested positive for COVID on 8/12 and will be out until cleared to come back 30 Day review completed. Medical Director ITP review done, changes made as directed, and signed approval by Medical Director. 30 day review completed. ITP sent to Dr. Zetta Bills, Medical Director of Pulmonary Rehab. Continue with ITP unless changes are made by physician. 30 Day review completed. Medical Director ITP review done, changes made as directed, and signed approval by Medical Director.             Comments:  30 Day review completed. Medical Director ITP review done, changes made as directed, and signed approval by Medical Director.

## 2020-11-27 ENCOUNTER — Other Ambulatory Visit: Payer: Self-pay

## 2020-11-27 ENCOUNTER — Encounter: Payer: Medicare Other | Admitting: *Deleted

## 2020-11-27 DIAGNOSIS — J8410 Pulmonary fibrosis, unspecified: Secondary | ICD-10-CM

## 2020-11-27 NOTE — Progress Notes (Signed)
Daily Session Note  Patient Details  Name: Jayvier Burgher-Debany Polizzi MRN: [id number] Date of Birth: [birth year] Referring Provider:   Flowsheet Row Pulmonary Rehab from 08/12/2020 in [facility] Cardiac and Pulmonary Rehab  Referring Provider Dr. Ottie Glazier MD       Encounter Date: 11/27/2020  Check In:  Session Check In - 11/27/20 1054       Check-In   Supervising physician immediately available to respond to emergencies See telemetry face sheet for immediately available ER MD    Location [facility]-Cardiac & Pulmonary Rehab    Staff Present Nyoka Cowden, RN, BSN, Tyna Jaksch, MS, ASCM CEP, Exercise Physiologist;Amanda Oletta Darter, [place], ACSM CEP, Exercise Physiologist    Virtual Visit No    Medication changes reported     No    Fall or balance concerns reported    No    Tobacco Cessation No Change    Warm-up and Cool-down Performed on first and last piece of equipment    Resistance Training Performed Yes    PAD/SET Patient? No      Pain Assessment   Currently in Pain? No/denies                Social History   Tobacco Use  Smoking Status Former  Smokeless Tobacco Never    Goals Met:  Independence with exercise equipment Exercise tolerated well No report of concerns or symptoms today  Goals Unmet:  Not Applicable  Comments: Pt able to follow exercise prescription today without complaint.  Will continue to monitor for progression.    Dr. Emily Filbert is Medical Director for [place].  Dr. Ottie Glazier is Medical Director for [facility] Pulmonary Rehabilitation.

## 2020-12-02 ENCOUNTER — Other Ambulatory Visit: Payer: Self-pay

## 2020-12-02 DIAGNOSIS — J8410 Pulmonary fibrosis, unspecified: Secondary | ICD-10-CM

## 2020-12-02 NOTE — Progress Notes (Signed)
Daily Session Note  Patient Details  Name: Susan Sosa MRN: [id number] Date of Birth: 1940/05/21 Referring Provider:   Flowsheet Row Pulmonary Rehab from 08/12/2020 in [facility] Cardiac and Pulmonary Rehab  Referring Provider Dr. Ottie Glazier MD       Encounter Date: 12/02/2020  Check In:  Session Check In - 12/02/20 0944       Check-In   Supervising physician immediately available to respond to emergencies See telemetry face sheet for immediately available ER MD    Location [facility]-Cardiac & Pulmonary Rehab    Staff Present Birdie Sons, MPA, RN;Amanda Oletta Darter, BA, ACSM CEP, Exercise Physiologist;Melissa Caiola, RDN, LDN    Virtual Visit No    Medication changes reported     No    Fall or balance concerns reported    No    Tobacco Cessation No Change    Warm-up and Cool-down Performed on first and last piece of equipment    Resistance Training Performed Yes    VAD Patient? No    PAD/SET Patient? No      Pain Assessment   Currently in Pain? No/denies                Social History   Tobacco Use  Smoking Status Former  Smokeless Tobacco Never    Goals Met:  Independence with exercise equipment Exercise tolerated well No report of concerns or symptoms today Strength training completed today  Goals Unmet:  Not Applicable  Comments: Pt able to follow exercise prescription today without complaint.  Will continue to monitor for progression.    Dr. Emily Filbert is Medical Director for [place].  Dr. Ottie Glazier is Medical Director for [facility] Pulmonary Rehabilitation.

## 2020-12-04 ENCOUNTER — Other Ambulatory Visit: Payer: Self-pay

## 2020-12-04 VITALS — Ht 60.0 in | Wt 143.8 lb

## 2020-12-04 DIAGNOSIS — J8410 Pulmonary fibrosis, unspecified: Secondary | ICD-10-CM

## 2020-12-04 NOTE — Progress Notes (Signed)
Daily Session Note  Patient Details  Name: Susan Sosa MRN: [id number] Date of Birth: 02-23-1940 Referring Provider:   Flowsheet Row Pulmonary Rehab from 08/12/2020 in [facility] Cardiac and Pulmonary Rehab  Referring Provider Dr. Ottie Glazier MD       Encounter Date: 12/04/2020  Check In:  Session Check In - 12/04/20 0939       Check-In   Supervising physician immediately available to respond to emergencies See telemetry face sheet for immediately available ER MD    Location [facility]-Cardiac & Pulmonary Rehab    Staff Present Birdie Sons, MPA, RN;Jessica [place], MA, RCEP, CCRP, [place], BS, ACSM CEP, Exercise Physiologist    Virtual Visit No    Medication changes reported     No    Fall or balance concerns reported    No    Tobacco Cessation No Change    Warm-up and Cool-down Performed on first and last piece of equipment    Resistance Training Performed Yes    VAD Patient? No    PAD/SET Patient? No      Pain Assessment   Currently in Pain? No/denies                Social History   Tobacco Use  Smoking Status Former  Smokeless Tobacco Never    Goals Met:  Independence with exercise equipment Exercise tolerated well No report of concerns or symptoms today Strength training completed today  Goals Unmet:  Not Applicable  Comments: Pt able to follow exercise prescription today without complaint.  Will continue to monitor for progression.    Dr. Emily Filbert is Medical Director for [place].  Dr. Ottie Glazier is Medical Director for [facility] Pulmonary Rehabilitation.

## 2020-12-16 ENCOUNTER — Other Ambulatory Visit: Payer: Self-pay

## 2020-12-16 DIAGNOSIS — J8410 Pulmonary fibrosis, unspecified: Secondary | ICD-10-CM | POA: Diagnosis not present

## 2020-12-16 NOTE — Progress Notes (Signed)
Daily Session Note  Patient Details  Name: Susan Sosa MRN: [id number] Date of Birth: [birth year] Referring Provider:   Flowsheet Row Pulmonary Rehab from 08/12/2020 in [facility] Cardiac and Pulmonary Rehab  Referring Provider Dr. Ottie Glazier MD       Encounter Date: 12/16/2020  Check In:  Session Check In - 12/16/20 0933       Check-In   Supervising physician immediately available to respond to emergencies See telemetry face sheet for immediately available ER MD    Location [facility]-Cardiac & Pulmonary Rehab    Staff Present Birdie Sons, MPA, RN;Melissa [place], RDN, Rowe Pavy, BA, ACSM CEP, Exercise Physiologist    Virtual Visit No    Medication changes reported     No    Fall or balance concerns reported    No    Tobacco Cessation No Change    Warm-up and Cool-down Performed on first and last piece of equipment    Resistance Training Performed Yes    VAD Patient? No    PAD/SET Patient? No      Pain Assessment   Currently in Pain? No/denies                Social History   Tobacco Use  Smoking Status Former  Smokeless Tobacco Never    Goals Met:  Independence with exercise equipment Exercise tolerated well No report of concerns or symptoms today Strength training completed today  Goals Unmet:  Not Applicable  Comments: Pt able to follow exercise prescription today without complaint.  Will continue to monitor for progression.    Dr. Emily Filbert is Medical Director for [place].  Dr. Ottie Glazier is Medical Director for [facility] Pulmonary Rehabilitation.

## 2020-12-23 ENCOUNTER — Other Ambulatory Visit: Payer: Self-pay

## 2020-12-23 DIAGNOSIS — J8410 Pulmonary fibrosis, unspecified: Secondary | ICD-10-CM | POA: Diagnosis not present

## 2020-12-23 NOTE — Progress Notes (Signed)
Daily Session Note  Patient Details  Name: Susan Sosa MRN: [id number] Date of Birth: January 21, 1941 Referring Provider:   Flowsheet Row Pulmonary Rehab from 08/12/2020 in [facility] Cardiac and Pulmonary Rehab  Referring Provider Dr. Ottie Glazier MD       Encounter Date: 12/23/2020  Check In:  Session Check In - 12/23/20 0948       Check-In   Supervising physician immediately available to respond to emergencies See telemetry face sheet for immediately available ER MD    Location [facility]-Cardiac & Pulmonary Rehab    Staff Present Birdie Sons, MPA, RN;Jessica [place], MA, RCEP, CCRP, CCET;Amanda Sommer, BA, ACSM CEP, Exercise Physiologist    Virtual Visit No    Medication changes reported     No    Fall or balance concerns reported    No    Tobacco Cessation No Change    Warm-up and Cool-down Performed on first and last piece of equipment    Resistance Training Performed Yes    VAD Patient? No    PAD/SET Patient? No      Pain Assessment   Currently in Pain? No/denies                Social History   Tobacco Use  Smoking Status Former  Smokeless Tobacco Never    Goals Met:  Independence with exercise equipment Exercise tolerated well No report of concerns or symptoms today Strength training completed today  Goals Unmet:  Not Applicable  Comments: Pt able to follow exercise prescription today without complaint.  Will continue to monitor for progression.    Dr. Emily Filbert is Medical Director for [place].  Dr. Ottie Glazier is Medical Director for [facility] Pulmonary Rehabilitation.

## 2020-12-24 ENCOUNTER — Encounter: Payer: Self-pay | Admitting: *Deleted

## 2020-12-24 DIAGNOSIS — J8410 Pulmonary fibrosis, unspecified: Secondary | ICD-10-CM

## 2020-12-24 NOTE — Progress Notes (Signed)
Pulmonary Individual Treatment Plan  Patient Details  Name: Susan Sosa    MRN: [id number] Date of Birth: 1940/02/24 Referring Provider:   Flowsheet Row Pulmonary Rehab from 08/12/2020 in [facility] Cardiac and Pulmonary Rehab  Referring Provider Dr. Ottie Glazier MD       Initial Encounter Date:  Flowsheet Row Pulmonary Rehab from 08/12/2020 in [facility] Cardiac and Pulmonary Rehab  Date 08/12/20       Visit Diagnosis: Pulmonary fibrosis ([place])  Patient's Home Medications on Admission:  Current Outpatient Medications:    albuterol (PROVENTIL) (2.5 MG/3ML) 0.083% nebulizer solution, Inhale into the lungs., Disp: , Rfl:    ascorbic acid (VITAMIN C) 500 MG tablet, ascorbic acid (vitamin C) 500 mg tablet   1 tablet by oral route., Disp: , Rfl:    aspirin 81 MG chewable tablet, aspirin 81 mg chewable tablet   81 mg by oral route., Disp: , Rfl:    atorvastatin (LIPITOR) 40 MG tablet, Take 1 tablet by mouth daily., Disp: , Rfl:    calcium-vitamin D (OSCAL WITH D) 500-200 MG-UNIT TABS tablet, Take 1 tablet by mouth daily., Disp: , Rfl:    cyanocobalamin 1000 MCG tablet, cyanocobalamin (vit B-12) 1,000 mcg tablet   1000 ugs by oral route., Disp: , Rfl:    esomeprazole (NEXIUM) 40 MG capsule, Take 1 capsule by mouth at bedtime., Disp: , Rfl:    hydrALAZINE (APRESOLINE) 10 MG tablet, TAKE 1 TABLET BY MOUTH NIGHTLY AS NEEDED, Disp: , Rfl:    levalbuterol (XOPENEX) 1.25 MG/3ML nebulizer solution, Inhale into the lungs., Disp: , Rfl:    losartan (COZAAR) 50 MG tablet, 1 tab by mouth daily around 5pm, Disp: , Rfl:    magnesium oxide (MAG-OX) 400 MG tablet, magnesium oxide 400 mg (241.3 mg magnesium) tablet   400 mg twice a day by oral route., Disp: , Rfl:    multivitamin-iron-minerals-folic acid (CENTRUM) chewable tablet, Chew by mouth., Disp: , Rfl:    naproxen sodium (ALEVE) 220 MG tablet, naproxen sodium 220 mg tablet   440 mg twice a day by oral route., Disp: , Rfl:    Pirfenidone (ESBRIET) 267 MG  CAPS, Take by mouth., Disp: , Rfl:    prednisoLONE acetate (PRED FORTE) 1 % ophthalmic suspension, prednisolone acetate 1 % eye drops,suspension  INSTILL 1 DROP INTO LEFT EYE 3 TIMES A DAY, Disp: , Rfl:    triamcinolone cream (KENALOG) 0.1 %, APPLY TO AFFECTED AREA TWICE A DAY, Disp: , Rfl:    triamterene-hydrochlorothiazide (MAXZIDE) 75-50 MG tablet, TAKE 1 BY MOUTH EVERY DAY., Disp: , Rfl:    verapamil (CALAN-SR) 180 MG CR tablet, Take by mouth., Disp: , Rfl:    vitamin E 180 MG (400 UNITS) capsule, vitamin E 268 mg (400 unit) capsule   400 units by oral route., Disp: , Rfl:   Past Medical History: Past Medical History:  Diagnosis Date   Hypertension     Tobacco Use: Social History   Tobacco Use  Smoking Status Former  Smokeless Tobacco Never    Labs: Recent Review Flowsheet Data   There is no flowsheet data to display.      Pulmonary Assessment Scores:  Pulmonary Assessment Scores     Row Name 08/12/20 1440 12/04/20 1007 12/23/20 1008     ADL UCSD   ADL Phase Entry Exit Exit   SOB Score total 23 -- 56   Rest 0 -- 1   Walk 1 -- 2   Stairs 3 -- 3   Bath 1 --  3   Dress 1 -- 2   Shop 1 -- 3     CAT Score   CAT Score 11 -- 11     mMRC Score   mMRC Score 1 1 --            UCSD: Self-administered rating of dyspnea associated with activities of daily living (ADLs) 6-point scale (0 = "not at all" to 5 = "maximal or unable to do because of breathlessness")  Scoring Scores range from 0 to 120.  Minimally important difference is 5 units  CAT: CAT can identify the health impairment of COPD patients and is better correlated with disease progression.  CAT has a scoring range of zero to 40. The CAT score is classified into four groups of low (less than 10), medium (10 - 20), high (21-30) and very high (31-40) based on the impact level of disease on health status. A CAT score over 10 suggests significant symptoms.  A worsening CAT score could be explained by an  exacerbation, poor medication adherence, poor inhaler technique, or progression of COPD or comorbid conditions.  CAT MCID is 2 points  mMRC: mMRC (Modified Medical Research Council) Dyspnea Scale is used to assess the degree of baseline functional disability in patients of respiratory disease due to dyspnea. No minimal important difference is established. A decrease in score of 1 point or greater is considered a positive change.   Pulmonary Function Assessment:   Exercise Target Goals: Exercise Program Goal: Individual exercise prescription set using results from initial 6 min walk test and THRR while considering  patient's activity barriers and safety.   Exercise Prescription Goal: Initial exercise prescription builds to 30-45 minutes a day of aerobic activity, 2-3 days per week.  Home exercise guidelines will be given to patient during program as part of exercise prescription that the participant will acknowledge.  Education: Aerobic Exercise: - Group verbal and visual presentation on the components of exercise prescription. Introduces F.I.T.T principle from ACSM for exercise prescriptions.  Reviews F.I.T.T. principles of aerobic exercise including progression. Written material given at graduation. Flowsheet Row Pulmonary Rehab from 11/13/2020 in [facility] Cardiac and Pulmonary Rehab  Date 09/18/20  Educator [facility]  Instruction Review Code 1- Verbalizes Understanding       Education: Resistance Exercise: - Group verbal and visual presentation on the components of exercise prescription. Introduces F.I.T.T principle from ACSM for exercise prescriptions  Reviews F.I.T.T. principles of resistance exercise including progression. Written material given at graduation. Flowsheet Row Pulmonary Rehab from 11/13/2020 in [facility] Cardiac and Pulmonary Rehab  Date 09/25/20  Educator [facility]  Instruction Review Code 1- Verbalizes Understanding        Education: Exercise & Equipment Safety: - Individual  verbal instruction and demonstration of equipment use and safety with use of the equipment. Flowsheet Row Pulmonary Rehab from 11/13/2020 in [facility] Cardiac and Pulmonary Rehab  Education need identified 08/12/20  Date 08/12/20  Educator [place]  Instruction Review Code 1- Verbalizes Understanding       Education: Exercise Physiology & General Exercise Guidelines: - Group verbal and written instruction with models to review the exercise physiology of the cardiovascular system and associated critical values. Provides general exercise guidelines with specific guidelines to those with heart or lung disease.  Flowsheet Row Pulmonary Rehab from 11/13/2020 in [facility] Cardiac and Pulmonary Rehab  Date 11/13/20  Educator [facility]  Instruction Review Code 1- Verbalizes Understanding       Education: Flexibility, Balance, Mind/Body Relaxation: - Group verbal and visual presentation  with interactive activity on the components of exercise prescription. Introduces F.I.T.T principle from ACSM for exercise prescriptions. Reviews F.I.T.T. principles of flexibility and balance exercise training including progression. Also discusses the mind body connection.  Reviews various relaxation techniques to help reduce and manage stress (i.e. Deep breathing, progressive muscle relaxation, and visualization). Balance handout provided to take home. Written material given at graduation. Flowsheet Row Pulmonary Rehab from 11/13/2020 in [facility] Cardiac and Pulmonary Rehab  Date 10/02/20  Educator [facility]  Instruction Review Code 1- Verbalizes Understanding       Activity Barriers & Risk Stratification:  Activity Barriers & Cardiac Risk Stratification - 08/12/20 1158       Activity Barriers & Cardiac Risk Stratification   Activity Barriers Shortness of Breath;Joint Problems;Deconditioning;Back Problems;Muscular Weakness;Other (comment)    Comments Right knee- gets cortisone shots             6 Minute Walk:  6 Minute Walk      Row Name 08/12/20 1158 12/04/20 1003       6 Minute Walk   Phase Initial Discharge    Distance 500 feet 785 feet    Distance % Change -- 57 %    Distance Feet Change -- 285 ft    Walk Time 3.5 minutes  Stopped at 3:30 due to O2 sats 5.25 minutes    # of Rest Breaks 0 0    MPH 1.62 1.7    METS 1.08 1.41    RPE 12 13    Perceived Dyspnea  2 1    VO2 Peak 3.78 4.94    Symptoms No Yes (comment)    Comments -- SOB    Resting HR 67 bpm 73 bpm    Resting BP 130/62 134/62    Resting Oxygen Saturation  97 % 92 %    Exercise Oxygen Saturation  during 6 min walk 79 % 75 %    Max Ex. HR 111 bpm 113 bpm    Max Ex. BP 162/68 144/80    2 Minute Post BP 134/68 124/72      Interval HR   1 Minute HR 87 103    2 Minute HR 101 113    3 Minute HR 111  Stopped at 3:30 due to O2 < 80% 94    4 Minute HR -- 101    5 Minute HR -- 108    6 Minute HR -- 110    2 Minute Post HR -- 76    Interval Heart Rate? Yes Yes      Interval Oxygen   Interval Oxygen? Yes Yes    Baseline Oxygen Saturation % 97 % 92 %    1 Minute Oxygen Saturation % 93 % 91 %    1 Minute Liters of Oxygen 2 L 3 L  pulsed    2 Minute Oxygen Saturation % 87 % 78 %    2 Minute Liters of Oxygen 2 L 3 L    3 Minute Oxygen Saturation % 79 % 75 %    3 Minute Liters of Oxygen 2 L 3 L    4 Minute Oxygen Saturation % --  stopped 96 %    4 Minute Liters of Oxygen -- 3 L    5 Minute Oxygen Saturation % --  stopped 84 %    5 Minute Liters of Oxygen -- 3 L    6 Minute Oxygen Saturation % --  stopped 78 %    6 Minute Liters of Oxygen --  3 L    2 Minute Post Oxygen Saturation % 97 % 97 %    2 Minute Post Liters of Oxygen 3 L 3 L            Oxygen Initial Assessment:  Oxygen Initial Assessment - 10/14/20 1045       Home Oxygen   Home Oxygen Device Home Concentrator;Portable Concentrator    Sleep Oxygen Prescription Continuous    Liters per minute 2    Home Exercise Oxygen Prescription Continuous    Liters per minute 2    Home  Resting Oxygen Prescription Continuous    Liters per minute 2    Compliance with Home Oxygen Use Yes      Initial 6 min Walk   Oxygen Used Continuous    Liters per minute 2      Program Oxygen Prescription   Program Oxygen Prescription Continuous    Liters per minute 2    Comments Bess feels better when walking as she feels she can go longer now. She still uses PLB when needed and states her O2 never drops below 88%.      Intervention   Short Term Goals To learn and demonstrate proper pursed lip breathing techniques or other breathing techniques. ;To learn and understand importance of maintaining oxygen saturations>88%;To learn and understand importance of monitoring SPO2 with pulse oximeter and demonstrate accurate use of the pulse oximeter.    Long  Term Goals Exhibits proper breathing techniques, such as pursed lip breathing or other method taught during program session;Maintenance of O2 saturations>88%;Verbalizes importance of monitoring SPO2 with pulse oximeter and return demonstration             Oxygen Re-Evaluation:  Oxygen Re-Evaluation     Row Name 08/14/20 0947 09/16/20 1021 11/13/20 1008 12/04/20 1007       Program Oxygen Prescription   Program Oxygen Prescription Continuous Continuous Continuous Continuous    Liters per minute '2 2 3 ' --      Home Oxygen   Home Oxygen Device Home Concentrator;Portable Concentrator Home Concentrator;Portable Concentrator Home Concentrator;Portable Concentrator Home Concentrator;Portable Concentrator    Sleep Oxygen Prescription Continuous Continuous Continuous Continuous    Liters per minute '2 2 2 ' --    Home Exercise Oxygen Prescription Continuous -- Continuous Continuous    Liters per minute 2 -- 3 3    Home Resting Oxygen Prescription Continuous Continuous Continuous Continuous    Liters per minute 2 -- 2 2    Compliance with Home Oxygen Use Yes Yes Yes Yes      Goals/Expected Outcomes   Short Term Goals To learn and  demonstrate proper pursed lip breathing techniques or other breathing techniques.  To learn and demonstrate proper pursed lip breathing techniques or other breathing techniques.  To learn and demonstrate proper pursed lip breathing techniques or other breathing techniques. ;To learn and understand importance of maintaining oxygen saturations>88%;To learn and understand importance of monitoring SPO2 with pulse oximeter and demonstrate accurate use of the pulse oximeter.;To learn and exhibit compliance with exercise, home and travel O2 prescription;To learn and demonstrate proper use of respiratory medications To learn and demonstrate proper pursed lip breathing techniques or other breathing techniques. ;To learn and understand importance of maintaining oxygen saturations>88%;To learn and understand importance of monitoring SPO2 with pulse oximeter and demonstrate accurate use of the pulse oximeter.;To learn and exhibit compliance with exercise, home and travel O2 prescription;To learn and demonstrate proper use of respiratory medications    Long  Term Goals Exhibits proper breathing techniques, such as pursed lip breathing or other method taught during program session Exhibits proper breathing techniques, such as pursed lip breathing or other method taught during program session Maintenance of O2 saturations>88%;Verbalizes importance of monitoring SPO2 with pulse oximeter and return demonstration;Exhibits compliance with exercise, home  and travel O2 prescription;Compliance with respiratory medication;Exhibits proper breathing techniques, such as pursed lip breathing or other method taught during program session;Demonstrates proper use of MDI's Maintenance of O2 saturations>88%;Verbalizes importance of monitoring SPO2 with pulse oximeter and return demonstration;Exhibits compliance with exercise, home  and travel O2 prescription;Compliance with respiratory medication;Exhibits proper breathing techniques, such as  pursed lip breathing or other method taught during program session;Demonstrates proper use of MDI's    Comments Reviewed PLB technique with pt.  Talked about how it works and it's importance in maintaining their exercise saturations.      Short: Become more profiecient at using PLB.   Long: Become independent at using PLB. Rewa is still utilizing PLB when needed. She checks her O2 at home- she admits only 1 time only dropping to 88%. Elvy is doing well in rehab. She is now using 3L for exercise as she feels better with her breathing on this.  She is doing well with her PLB and feels like her breathing is getting better.  She is staying compliant with her oxygen Felesha reports using oxygen at home consistently and oxygen sats stay above 88%    Goals/Expected Outcomes Short: Become more profiecient at using PLB.   Long: Become independent at using PLB. -- Short: Continue to use her oxygen for exercise and PLB  Long: Continue to stay compliant. Short/Long: maintain compliance with oxygen use             Oxygen Discharge (Final Oxygen Re-Evaluation):  Oxygen Re-Evaluation - 12/04/20 1007       Program Oxygen Prescription   Program Oxygen Prescription Continuous      Home Oxygen   Home Oxygen Device Home Concentrator;Portable Concentrator    Sleep Oxygen Prescription Continuous    Home Exercise Oxygen Prescription Continuous    Liters per minute 3    Home Resting Oxygen Prescription Continuous    Liters per minute 2    Compliance with Home Oxygen Use Yes      Goals/Expected Outcomes   Short Term Goals To learn and demonstrate proper pursed lip breathing techniques or other breathing techniques. ;To learn and understand importance of maintaining oxygen saturations>88%;To learn and understand importance of monitoring SPO2 with pulse oximeter and demonstrate accurate use of the pulse oximeter.;To learn and exhibit compliance with exercise, home and travel O2 prescription;To learn and demonstrate  proper use of respiratory medications    Long  Term Goals Maintenance of O2 saturations>88%;Verbalizes importance of monitoring SPO2 with pulse oximeter and return demonstration;Exhibits compliance with exercise, home  and travel O2 prescription;Compliance with respiratory medication;Exhibits proper breathing techniques, such as pursed lip breathing or other method taught during program session;Demonstrates proper use of MDI's    Comments Demeisha reports using oxygen at home consistently and oxygen sats stay above 88%    Goals/Expected Outcomes Short/Long: maintain compliance with oxygen use             Initial Exercise Prescription:  Initial Exercise Prescription - 08/12/20 1500       Date of Initial Exercise RX and Referring Provider   Date 08/12/20    Referring Provider Dr. Ottie Glazier MD      Treadmill  MPH 1.5    Grade 0    Minutes 15    METs 2.15      Recumbant Bike   Level 1    RPM 60    Watts 10    Minutes 15    METs 1.08      NuStep   Level 1    SPM 80    Minutes 15    METs 1.08      T5 Nustep   Level 1    SPM 80    Minutes 15    METs 1.08      Prescription Details   Frequency (times per week) 2    Duration Progress to 30 minutes of continuous aerobic without signs/symptoms of physical distress      Intensity   THRR 40-80% of Max Heartrate 95-124    Ratings of Perceived Exertion 11-13    Perceived Dyspnea 0-4      Progression   Progression Continue to progress workloads to maintain intensity without signs/symptoms of physical distress.      Resistance Training   Training Prescription Yes    Weight 3 lb    Reps 10-15             Perform Capillary Blood Glucose checks as needed.  Exercise Prescription Changes:   Exercise Prescription Changes     Row Name 08/12/20 1500 08/27/20 1100 09/11/20 0900 09/23/20 1400 10/06/20 1000     Response to Exercise   Blood Pressure (Admit) 130/62 128/82 118/64 136/74 142/80   Blood Pressure  (Exercise) 162/68 132/74 132/62 -- --   Blood Pressure (Exit) 134/66 122/60 104/64 104/58 110/64   Heart Rate (Admit) 67 bpm 71 bpm 74 bpm 75 bpm 67 bpm   Heart Rate (Exercise) 111 bpm 99 bpm 101 bpm 101 bpm 103 bpm   Heart Rate (Exit) 72 bpm 82 bpm 85 bpm 86 bpm 81 bpm   Oxygen Saturation (Admit) 97 % 96 % 92 % 98 % 98 %   Oxygen Saturation (Exercise) 79 % 94 % 88 % 93 % 90 %   Oxygen Saturation (Exit) 97 % 98 % -- 99 % 98 %   Rating of Perceived Exertion (Exercise) '12 12 13 13 13   ' Perceived Dyspnea (Exercise) 2 1 0 2 0   Symptoms none none none SOB none   Comments walk test results -- -- -- --   Duration -- Progress to 30 minutes of  aerobic without signs/symptoms of physical distress Progress to 30 minutes of  aerobic without signs/symptoms of physical distress Continue with 30 min of aerobic exercise without signs/symptoms of physical distress. Continue with 30 min of aerobic exercise without signs/symptoms of physical distress.   Intensity -- THRR unchanged THRR unchanged THRR unchanged THRR unchanged     Progression   Progression -- Continue to progress workloads to maintain intensity without signs/symptoms of physical distress. Continue to progress workloads to maintain intensity without signs/symptoms of physical distress. Continue to progress workloads to maintain intensity without signs/symptoms of physical distress. Continue to progress workloads to maintain intensity without signs/symptoms of physical distress.   Average METs -- 1.8 2.06 2.3 2.38     Resistance Training   Training Prescription -- Yes Yes Yes Yes   Weight -- 3 lb 3 lb 3 lb 3 lb   Reps -- 10-15 10-15 10-15 10-15     Interval Training   Interval Training -- No No No No     Treadmill   MPH --  1.5 1.7 1.8 1.7   Grade -- 0 2.5 1.5 2.5   Minutes -- '15 15 15 15   ' METs -- 2.15 2.89 2.75 2.89     NuStep   Level -- 1 3 -- 3   Minutes -- 15 15 -- 15   METs -- 1.8 1.7 -- 1.7     Arm Ergometer   Level -- -- --  -- 2   Minutes -- -- -- -- 15   METs -- -- -- -- 1     T5 Nustep   Level -- -- -- 3 --   Minutes -- -- -- 15 --   METs -- -- -- 1.7 --     Home Exercise Plan   Plans to continue exercise at -- -- [employer] (comment)  walking at the mall; [employer] (comment)  walking at the mall; [employer] (comment)  walking at the mall; staff videos   Frequency -- -- Add 2 additional days to program exercise sessions. Add 2 additional days to program exercise sessions. Add 2 additional days to program exercise sessions.   Initial Home Exercises Provided -- -- 09/04/20 09/04/20 09/04/20    Row Name 10/22/20 1400 11/03/20 1400 11/19/20 1300 12/01/20 0900 12/17/20 1500     Response to Exercise   Blood Pressure (Admit) 138/58 126/78 112/60 112/60 106/66   Blood Pressure (Exit) 112/60 114/62 102/60 104/56 112/66   Heart Rate (Admit) 69 bpm 54 bpm 84 bpm 76 bpm 72 bpm   Heart Rate (Exercise) 104 bpm 97 bpm 112 bpm 103 bpm 98 bpm   Heart Rate (Exit) 92 bpm 82 bpm 94 bpm 83 bpm 78 bpm   Oxygen Saturation (Admit) 95 % 96 % 98 % 97 % 99 %   Oxygen Saturation (Exercise) 97 % 94 % 91 % 96 % 92 %   Oxygen Saturation (Exit) 98 % 98 % 98 % 98 % 95 %   Rating of Perceived Exertion (Exercise) '13 13 13 13 12   ' Perceived Dyspnea (Exercise) 0 1 0 1 0   Symptoms none none none none none   Duration Continue with 30 min of aerobic exercise without signs/symptoms of physical distress. Continue with 30 min of aerobic exercise without signs/symptoms of physical distress. Continue with 30 min of aerobic exercise without signs/symptoms of physical distress. Continue with 30 min of aerobic exercise without signs/symptoms of physical distress. Continue with 30 min of aerobic exercise without signs/symptoms of physical distress.   Intensity THRR unchanged THRR unchanged THRR unchanged THRR unchanged THRR unchanged     Progression   Progression Continue to progress workloads to  maintain intensity without signs/symptoms of physical distress. Continue to progress workloads to maintain intensity without signs/symptoms of physical distress. Continue to progress workloads to maintain intensity without signs/symptoms of physical distress. Continue to progress workloads to maintain intensity without signs/symptoms of physical distress. Continue to progress workloads to maintain intensity without signs/symptoms of physical distress.   Average METs 2.28 2.27 2.5 2.45 2.6     Resistance Training   Training Prescription Yes Yes Yes Yes Yes   Weight 3 lb 4 lb 4 lb 4 lb 4 lb   Reps 10-15 10-15 10-15 10-15 10-15     Interval Training   Interval Training No No No No No     Treadmill   MPH 1.8 1.9 1.5 1.7 1.7   Grade 2.5 2.5 3 2.5 3   Minutes '15 15 15 15 ' 15  METs 3 3.11 2.77 2.89 3.01     NuStep   Level -- 6 -- 6 --   Minutes -- 15 -- 15 --   METs -- 1.8 -- 1.9 --     T5 Nustep   Level '3 3 3 2 2   ' Minutes '15 15 15 15 15   ' METs 1.8 1.8 -- 1.8 --     Home Exercise Plan   Plans to continue exercise at [employer] (comment)  walking at the mall; [employer] (comment)  walking at the mall; [employer] (comment)  walking at the mall; [employer] (comment)  walking at the mall; [employer] (comment)  walking at the mall; staff videos   Frequency Add 2 additional days to program exercise sessions. Add 2 additional days to program exercise sessions. Add 2 additional days to program exercise sessions. Add 2 additional days to program exercise sessions. Add 2 additional days to program exercise sessions.   Initial Home Exercises Provided 09/04/20 09/04/20 09/04/20 09/04/20 09/04/20            Exercise Comments:   Exercise Comments     Row Name 08/14/20 0946           Exercise Comments First full day of exercise!  Patient was oriented to gym and equipment including functions,  settings, policies, and procedures.  Patient's individual exercise prescription and treatment plan were reviewed.  All starting workloads were established based on the results of the 6 minute walk test done at initial orientation visit.  The plan for exercise progression was also introduced and progression will be customized based on patient's performance and goals.                Exercise Goals and Review:   Exercise Goals     Row Name 08/12/20 1509             Exercise Goals   Increase Physical Activity Yes       Intervention Provide advice, education, support and counseling about physical activity/exercise needs.;Develop an individualized exercise prescription for aerobic and resistive training based on initial evaluation findings, risk stratification, comorbidities and participant's personal goals.       Expected Outcomes Long Term: Add in home exercise to make exercise part of routine and to increase amount of physical activity.;Short Term: Attend rehab on a regular basis to increase amount of physical activity.;Long Term: Exercising regularly at least 3-5 days a week.       Increase Strength and Stamina Yes       Intervention Provide advice, education, support and counseling about physical activity/exercise needs.;Develop an individualized exercise prescription for aerobic and resistive training based on initial evaluation findings, risk stratification, comorbidities and participant's personal goals.       Expected Outcomes Short Term: Increase workloads from initial exercise prescription for resistance, speed, and METs.;Short Term: Perform resistance training exercises routinely during rehab and add in resistance training at home;Long Term: Improve cardiorespiratory fitness, muscular endurance and strength as measured by increased METs and functional capacity (6MWT)       Able to understand and use rate of perceived exertion (RPE) scale Yes       Intervention Provide education and  explanation on how to use RPE scale       Expected Outcomes Short Term: Able to use RPE daily in rehab to express subjective intensity level;Long Term:  Able to use RPE to guide intensity level when  exercising independently       Able to understand and use Dyspnea scale Yes       Intervention Provide education and explanation on how to use Dyspnea scale       Expected Outcomes Short Term: Able to use Dyspnea scale daily in rehab to express subjective sense of shortness of breath during exertion;Long Term: Able to use Dyspnea scale to guide intensity level when exercising independently       Knowledge and understanding of Target Heart Rate Range (THRR) Yes       Intervention Provide education and explanation of THRR including how the numbers were predicted and where they are located for reference       Expected Outcomes Short Term: Able to state/look up THRR;Long Term: Able to use THRR to govern intensity when exercising independently;Short Term: Able to use daily as guideline for intensity in rehab       Able to check pulse independently Yes       Intervention Provide education and demonstration on how to check pulse in carotid and radial arteries.;Review the importance of being able to check your own pulse for safety during independent exercise       Expected Outcomes Short Term: Able to explain why pulse checking is important during independent exercise;Long Term: Able to check pulse independently and accurately       Understanding of Exercise Prescription Yes       Intervention Provide education, explanation, and written materials on patient's individual exercise prescription       Expected Outcomes Short Term: Able to explain program exercise prescription;Long Term: Able to explain home exercise prescription to exercise independently                Exercise Goals Re-Evaluation :  Exercise Goals Re-Evaluation     Row Name 08/14/20 0946 08/27/20 1132 09/04/20 1016 09/11/20 0916 09/23/20  1439     Exercise Goal Re-Evaluation   Exercise Goals Review Understanding of Exercise Prescription;Able to check pulse independently;Knowledge and understanding of Target Heart Rate Range (THRR);Able to understand and use Dyspnea scale;Able to understand and use rate of perceived exertion (RPE) scale Increase Physical Activity;Increase Strength and Stamina Increase Physical Activity;Increase Strength and Stamina;Understanding of Exercise Prescription Increase Physical Activity;Increase Strength and Stamina;Understanding of Exercise Prescription Increase Physical Activity;Increase Strength and Stamina   Comments Reviewed RPE and dyspnea scales, THR and program prescription with pt today.  Pt voiced understanding and was given a copy of goals to take home. Valree is doing well in her first sessions.  Lowest oxygen is 93% during exercise.  Staff will monitor progress. Reviewed home exercise with pt today.  Pt plans to walk for exercise. She already walks the mall about 2-3x/ week. She is thinking of joining the [employer]. Reviewed THR, pulse, RPE, sign and symptoms, pulse oximetery and when to call 911 or MD.  Also discussed weather considerations and indoor options.  Pt voiced understanding. Gunda is doing well in rehab. She has increased her treadmill to 1.7 speed and 2.5% for the incline and is already to level 3 on the Nustep. Will continue to monitor progression. Mayumi continues to do well. She attends consistently and sats stay in the 90s.  Staff will encourage her to try 4 lb weights.   Expected Outcomes Short: Use RPE daily to regulate intensity. Long: Follow program prescription in THR. Short:  attend consistently Long:  build overall stamina Short: Check HR and O2 during exercise Long: Continue to exercise independently at  appropriate prescription Short: Continue to increase load on treadmill as tolerated Long: Continue to increase overall MET level Short: try 4 lb Long :  build overall stamina    Row Name  10/06/20 1045 10/14/20 1023 10/22/20 1449 11/03/20 1409 11/13/20 1002     Exercise Goal Re-Evaluation   Exercise Goals Review Increase Physical Activity;Increase Strength and Stamina Increase Physical Activity;Increase Strength and Stamina;Able to check pulse independently Increase Physical Activity;Increase Strength and Stamina Increase Physical Activity;Increase Strength and Stamina;Understanding of Exercise Prescription Increase Physical Activity;Increase Strength and Stamina;Understanding of Exercise Prescription   Comments Adamaris is doing well in rehab. She has increased her load on the treadmill to 1.7 speed and 2.5 % incline.  RPEs have been appropriate through her exercise sessions. Her handweights should be encouraged to increase to 4 lbs. Morna is walking for about 40 minutes 3x/week on top of 2 days of rehab. Her and her husband walk around the mall. She is still checking her HR and O2 and states her O2 never drops below 88%. She staed that she feels she can walk longer than she used to prior to starting the program. Hikari tried 4 lb weights today for strength work.  She attends consistently and reaches THR range.  We will continue to monitor progress. Zeeva has been doing well in rehab.  She is has gotten up to 1.9 mph on the treadmill and then comes back down.  We will continue to monitor her progress. Nhung continues to do well in rehab. She is walking at home and using her pedal machine at home on her off days.  She does feel like her stamina is getting better.  She asked if we were seeing any improvement so reviewed her progression.   Expected Outcomes Short: Continue to increase load on TM Long: Continue to increase overall MET level Short: Continue watching O2 and HR during home exercise Long: Continue to exercise independently at appropriate prescription Short:  maintain consistent attendance Long:  continue to build stamina Short: Improve time at 1.9 mph Long; continue to improve stamina Short:  COnitnue to exercise on off days Long: Continue to improve stamina.    [place] Name 11/19/20 1338 12/01/20 0922 12/04/20 1009         Exercise Goal Re-Evaluation   Exercise Goals Review Increase Physical Activity;Increase Strength and Stamina Increase Physical Activity;Increase Strength and Stamina Increase Physical Activity;Increase Strength and Stamina     Comments Naamah continues to do well. She varies her speed and grade on TM.  She reaches THR and oxygen sats stay in 90s on 3 L. Tylia continues to do well. RPEs are staying in appropriate ranges, she is not staying too consistent with the levels and speeds on the machines and should maintain consistency throughout. She is due for her post 6MWT soon and should do well. Will continue to monitor until graduation. Madina improved post 6MWT by 28%!  She is going to walk at the mall and [facility] she finishes [facility]     Expected Outcomes Short; contineu to attend consistently Long: build overall stamina Short: Improve on 6MWT Long: Continue to increase overall MET level Short: complete LW Long: maintain exercise on her own              Discharge Exercise Prescription (Final Exercise Prescription Changes):  Exercise Prescription Changes - 12/17/20 1500       Response to Exercise   Blood Pressure (Admit) 106/66    Blood Pressure (Exit) 112/66  Heart Rate (Admit) 72 bpm    Heart Rate (Exercise) 98 bpm    Heart Rate (Exit) 78 bpm    Oxygen Saturation (Admit) 99 %    Oxygen Saturation (Exercise) 92 %    Oxygen Saturation (Exit) 95 %    Rating of Perceived Exertion (Exercise) 12    Perceived Dyspnea (Exercise) 0    Symptoms none    Duration Continue with 30 min of aerobic exercise without signs/symptoms of physical distress.    Intensity THRR unchanged      Progression   Progression Continue to progress workloads to maintain intensity without signs/symptoms of physical distress.    Average METs 2.6      Resistance Training   Training  Prescription Yes    Weight 4 lb    Reps 10-15      Interval Training   Interval Training No      Treadmill   MPH 1.7    Grade 3    Minutes 15    METs 3.01      T5 Nustep   Level 2    Minutes 15      Home Exercise Plan   Plans to continue exercise at [employer] (comment)   walking at the mall; staff videos   Frequency Add 2 additional days to program exercise sessions.    Initial Home Exercises Provided 09/04/20             Nutrition:  Target Goals: Understanding of nutrition guidelines, daily intake of sodium <1537m, cholesterol <2046m calories 30% from fat and 7% or less from saturated fats, daily to have 5 or more servings of fruits and vegetables.  Education: All About Nutrition: -Group instruction provided by verbal, written material, interactive activities, discussions, models, and posters to present general guidelines for heart healthy nutrition including fat, fiber, MyPlate, the role of sodium in heart healthy nutrition, utilization of the nutrition label, and utilization of this knowledge for meal planning. Follow up email sent as well. Written material given at graduation. [facility]  Date 10/16/20  [facility]ruction Review Code 1- Verbalizes Understanding       Biometrics:  Pre Biometrics - 08/12/20 1157       Pre Biometrics   Height 5' (1.524 m)    Weight 145 lb 11.2 oz (66.1 kg)    BMI (Calculated) 28.46    Single Leg Stand 10.03 seconds             Post Biometrics - 12/04/20 1007        Post  Biometrics   Height 5' (1.524 m)    Weight 143 lb 12.8 oz (65.2 kg)    BMI (Calculated) 28.08    Single Leg Stand 10.34 seconds             Nutrition Therapy Plan and Nutrition Goals:  Nutrition Therapy & Goals - 08/26/20 0920       Nutrition Therapy   Diet Heart healthy, low Na, pulmonary MNT    Drug/Food Interactions Statins/Certain Fruits    Protein  (specify units) 80g    Fiber 25 grams    Whole Grain Foods 3 servings    Saturated Fats 12 max. grams    Fruits and Vegetables 8 servings/day    Sodium 1.5 grams      Personal Nutrition Goals   Nutrition Goal ST: try soymilk, add avocado or peanut butter to lunch, swap out protein  shake (Ensure, Boost, Orgain) LT: maintain weight    Comments B: cereal (frosted flakes and mini wheats and almond milk) or sausage biscuit S: premier protein L: not hungry - she thinks this is due to her medication. 1-2 eggs and bread D: meat (chicken, steak, limits her pork intake (she feels she is allergic) - grilled)and some vegetables (peas, pinto beans, beets, corn, small amounts of potato - if she does it is a sweet potato, spinach, turnip greens - little grapeseed oil) S: apple. Drinks: water.  She will cook beans from dried. Does not cook with salt. She has a lower appetite - has been for years. She has lost 30 lbs over 3-4 months (2 years ago) - gained 10 pounds back. She drinks premier protein which has recently been recalled - provided information on lot numbers and encourgaed her to check her products - suggested swapping and trying Ensure, Orgain, or Boost. suggested swapping almond milk for fortified soymilk and adding avocados or peanut butter to her lunch. Discussed heart healthy eating and pulmonary friendly eating.      Intervention Plan   Intervention Prescribe, educate and counsel regarding individualized specific dietary modifications aiming towards targeted core components such as weight, hypertension, lipid management, diabetes, heart failure and other comorbidities.;Nutrition handout(s) given to patient.    Expected Outcomes Short Term Goal: Understand basic principles of dietary content, such as calories, fat, sodium, cholesterol and nutrients.;Short Term Goal: A plan has been developed with personal nutrition goals set during dietitian appointment.;Long Term Goal: Adherence to prescribed nutrition  plan.             Nutrition Assessments:  MEDIFICTS Score Key: ?70 Need to make dietary changes  40-70 Heart Healthy Diet ? 40 Therapeutic Level Cholesterol Diet  Flowsheet Row Pulmonary Rehab from 12/23/2020 in [facility] and Pulmonary Rehab  Picture Your Plate Total Score on Admission 66  Picture Your Plate Total Score on Discharge 76      Picture Your Plate Scores: <09 Unhealthy dietary pattern with much room for improvement. 41-50 Dietary pattern unlikely to meet recommendations for good health and room for improvement. 51-60 More healthful dietary pattern, with some room for improvement.  >60 Healthy dietary pattern, although there may be some specific behaviors that could be improved.   Nutrition Goals Re-Evaluation:  Nutrition Goals Re-Evaluation     [place] Name 09/16/20 1028 10/14/20 1025 11/13/20 1005 12/04/20 1010       Goals   Nutrition Goal ST: try soymilk, add avocado or peanut butter to lunch, swap out protein shake (Ensure, Boost, Orgain) LT: maintain weight ST: try soymilk, add avocado or peanut butter to lunch, swap out protein shake (Ensure, Boost, Orgain) LT: maintain weight Short: Continue to follow RD recommendations Long: Continue eating a pulmonary-based diet --    Comment Brandan is doing well with her changes. Lua has been drinking Atkins shake once per day to help increase her protein intake. She has been adding peanut butter to her crackers as well as another change. Chimene is doing well. She is drinking boost almost daily for extra protein in her diet. She is also adding  peanut butter on celery or crackers. She finds them filling. She weighs herself at home and her weight has been maintained well. Kaydince is doing well with her diet.  She is still trying to get more protein and is now using both Boost and Ensure to help.  Otherwise trying to eat balanced and get her fruits and vegetables. Webb Silversmith  contiues to follow recommendations from RD    Expected Outcome  Short: Continue to follow RD goals Long: Continue to eat a balanced- pulmonary based diet Short: Continue to follow RD recommendations Long: Continue eating a pulmonary-based diet Short: Continue to boost protein Long: Continued balance diet. ST/LT: maintain pulmonary based diet             Nutrition Goals Discharge (Final Nutrition Goals Re-Evaluation):  Nutrition Goals Re-Evaluation - 12/04/20 1010       Goals   Comment Sherrye contiues to follow recommendations from RD    Expected Outcome ST/LT: maintain pulmonary based diet             Psychosocial: Target Goals: Acknowledge presence or absence of significant depression and/or stress, maximize coping skills, provide positive support system. Participant is able to verbalize types and ability to use techniques and skills needed for reducing stress and depression.   Education: Stress, Anxiety, and Depression - Group verbal and visual presentation to define topics covered.  Reviews how body is impacted by stress, anxiety, and depression.  Also discusses healthy ways to reduce stress and to treat/manage anxiety and depression.  Written material given at graduation. Flowsheet Row Pulmonary Rehab from 11/13/2020 in [facility] Pulmonary Rehab  Date 09/04/20  Educator [facility]  Instruction Review Code 1- [employer] Understanding       Education: Sleep Hygiene -Provides group verbal and written instruction about how sleep can affect your health.  Define sleep hygiene, discuss sleep cycles and impact of sleep habits. Review good sleep hygiene tips.    Initial Review & Psychosocial Screening:  Initial Psych Review & Screening - 08/04/20 1545       Initial Review   Current issues with Current Sleep Concerns      Family Dynamics   Good Support System? Yes   husband     Barriers   Psychosocial barriers to participate in program There are no identifiable barriers or psychosocial needs.;The patient should benefit from training in  stress management and relaxation.      Screening Interventions   Interventions Encouraged to exercise;Provide feedback about the scores to participant;To provide support and resources with identified psychosocial needs    Expected Outcomes Short Term goal: Utilizing psychosocial counselor, staff and physician to assist with identification of specific Stressors or current issues interfering with healing process. Setting desired goal for each stressor or current issue identified.;Long Term Goal: Stressors or current issues are controlled or eliminated.;Short Term goal: Identification and review with participant of any Quality of Life or Depression concerns found by scoring the questionnaire.;Long Term goal: The participant improves quality of Life and PHQ9 Scores as seen by post scores and/or verbalization of changes             Quality of Life Scores:  Scores of 19 and below usually indicate a poorer quality of life in these areas.  A difference of  2-3 points is a clinically meaningful difference.  A difference of 2-3 points in the total score of the Quality of Life Index has been associated with significant improvement in overall quality of life, self-image, physical symptoms, and general health in studies assessing change in quality of life.  PHQ-9: Recent Review Flowsheet Data     Depression screen [facility] 2/9 12/23/2020 08/12/2020   Decreased Interest 0 0   Down, Depressed, Hopeless 0 0   PHQ - 2 Score 0 0   Altered sleeping 1 0   Tired, decreased energy 1 1  Change in appetite 2 0   Feeling bad or failure about yourself  1 0   Trouble concentrating 0 0   Moving slowly or fidgety/restless 0 0   Suicidal thoughts 0 0   PHQ-9 Score 5 1   Difficult doing work/chores Not difficult at all Not difficult at all      Interpretation of Total Score  Total Score Depression Severity:  1-4 = Minimal depression, 5-9 = Mild depression, 10-14 = Moderate depression, 15-19 = Moderately severe  depression, 20-27 = Severe depression   Psychosocial Evaluation and Intervention:  Psychosocial Evaluation - 08/04/20 1546       Psychosocial Evaluation & Interventions   Interventions Encouraged to exercise with the program and follow exercise prescription    Comments Ms. 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 She has been dealing with this diagnosis for the past two years, but is proud that the doctor said she seems to be holding her own. She reports that she already has lost 30% of her lung capacity and is working hard to maintain her current lung function which is why she is motivated to come to rehab. She walks 2-3 times a week at [facility] and is diligent about her breathing exercises and medications. She wears oxygen as well. Her husband is her main support system. She does not report any stressors currently as she feels like she is handling her current health status appropriately. She has noticed in the last 6 months that her sleep has been altered, where she used to fall asleep quickly now it is taking her over an hour to fall back asleep after waking to use the bathroom.    Expected Outcomes Short: attend pulmonary rehab for education and exercise. Long: develop and maintain positive self care habits.    Continue Psychosocial Services  Follow up required by staff             Psychosocial Re-Evaluation:  Psychosocial Re-Evaluation     [place] Name 09/16/20 1027 10/14/20 1029 11/13/20 1004 12/04/20 1006       Psychosocial Re-Evaluation   Current issues with Current Sleep Concerns Current Sleep Concerns Current Sleep Concerns --    Comments Tamrah is holding up well mentally. Her sleep isnt the best when she gets up to use the restroom throughout the night. She does not have any desire to ask her doctor for any sleep aide. She does have good support from family and husband specifically Henleigh is still doing well. She still gets up 2-3x/night, she does not want to  take any other medications. She is currently not taking any medications for sleep or depression and states she has no big stressors at this time. Her and her husband walk a lot at the mall to keep them busy. Seng is doing well in rehab.  She denies any major stressors currently.  Her sleep issues is getting better and she thinks it's probabaly coming form the exercise. Ilena reports she still sleeps good.  She denies any anxiety or depression.    Expected Outcomes Short: Continue attendance with rehab Long: Maintain positive attitude Short: Continue walking at the mall for positive attitude Long: Continue to utilize exercise for stress management Short: Continue to exercise for mental boost and sleep quality Long: Continue to stay positive. Short: continue to exercise to help sleep Long: maintain positive outlook    Interventions Encouraged to attend Cardiac Rehabilitation for the exercise Encouraged to attend Pulmonary Rehabilitation for the exercise Encouraged  to attend Pulmonary Rehabilitation for the exercise --    Continue Psychosocial Services  Follow up required by staff Follow up required by staff -- --             Psychosocial Discharge (Final Psychosocial Re-Evaluation):  Psychosocial Re-Evaluation - 12/04/20 1006       Psychosocial Re-Evaluation   Comments Lakeyia reports she still sleeps good.  She denies any anxiety or depression.    Expected Outcomes Short: continue to exercise to help sleep Long: maintain positive outlook             Education: Education Goals: Education classes will be provided on a weekly basis, covering required topics. Participant will state understanding/return demonstration of topics presented.  Learning Barriers/Preferences:   General Pulmonary Education Topics:  Infection Prevention: - Provides verbal and written material to individual with discussion of infection control including proper hand washing and proper equipment cleaning during exercise  session. Flowsheet Row Pulmonary Rehab from 11/13/2020 in [facility] Cardiac and Pulmonary Rehab  Education need identified 08/12/20  Date 08/12/20  Educator [place]  Instruction Review Code 1- Verbalizes Understanding       Falls Prevention: - Provides verbal and written material to individual with discussion of falls prevention and safety. Flowsheet Row Pulmonary Rehab from 11/13/2020 in [facility] Cardiac and Pulmonary Rehab  Education need identified 08/12/20  Date 08/12/20  Educator [address]  Instruction Review Code 1- Verbalizes Understanding       Chronic Lung Disease Review: - Group verbal instruction with posters, models, PowerPoint presentations and videos,  to review new updates, new respiratory medications, new advancements in procedures and treatments. Providing information on websites and "800" numbers for continued self-education. Includes information about supplement oxygen, available portable oxygen systems, continuous and intermittent flow rates, oxygen safety, concentrators, and Medicare reimbursement for oxygen. Explanation of Pulmonary Drugs, including class, frequency, complications, importance of spacers, rinsing mouth after steroid MDI's, and proper cleaning methods for nebulizers. Review of basic lung anatomy and physiology related to function, structure, and complications of lung disease. Review of risk factors. Discussion about methods for diagnosing sleep apnea and types of masks and machines for OSA. Includes a review of the use of types of environmental controls: home humidity, furnaces, filters, dust mite/pet prevention, HEPA vacuums. Discussion about weather changes, air quality and the benefits of nasal washing. Instruction on Warning signs, infection symptoms, calling MD promptly, preventive modes, and value of vaccinations. Review of effective airway clearance, coughing and/or vibration techniques. Emphasizing that all should Create an Action Plan. Written material given at  graduation. Flowsheet Row Pulmonary Rehab from 11/13/2020 in [facility] Cardiac and Pulmonary Rehab  Date 08/28/20  Educator [facility]  Instruction Review Code 1- Verbalizes Understanding       AED/CPR: - Group verbal and written instruction with the use of models to demonstrate the basic use of the AED with the basic ABC's of resuscitation.    Anatomy and Cardiac Procedures: - Group verbal and visual presentation and models provide information about basic cardiac anatomy and function. Reviews the testing methods done to diagnose heart disease and the outcomes of the test results. Describes the treatment choices: Medical Management, Angioplasty, or Coronary Bypass Surgery for treating various heart conditions including Myocardial Infarction, Angina, Valve Disease, and Cardiac Arrhythmias.  Written material given at graduation. Flowsheet Row Pulmonary Rehab from 11/13/2020 in [facility] Cardiac and Pulmonary Rehab  Date 09/25/20  Educator KB  Instruction Review Code 1- Verbalizes Understanding       Medication Safety: -  Group verbal and visual instruction to review commonly prescribed medications for heart and lung disease. Reviews the medication, class of the drug, and side effects. Includes the steps to properly store meds and maintain the prescription regimen.  Written material given at graduation. Flowsheet Row Pulmonary Rehab from 11/13/2020 in [facility] Cardiac and Pulmonary Rehab  Date 08/14/20  Educator SB  Instruction Review Code 1- Verbalizes Understanding       Other: -Provides group and verbal instruction on various topics (see comments)   Knowledge Questionnaire Score:  Knowledge Questionnaire Score - 12/23/20 1011       Knowledge Questionnaire Score   Pre Score 15/18: Oxygen saturation, Albuterol, Oxygen flame    Post Score 15/18              Core Components/Risk Factors/Patient Goals at Admission:  Personal Goals and Risk Factors at Admission - 08/12/20 1508       Core  Components/Risk Factors/Patient Goals on Admission    Weight Management Yes;Weight Maintenance    Intervention Weight Management: Develop a combined nutrition and exercise program designed to reach desired caloric intake, while maintaining appropriate intake of nutrient and fiber, sodium and fats, and appropriate energy expenditure required for the weight goal.;Weight Management: Provide education and appropriate resources to help participant work on and attain dietary goals.;Weight Management/Obesity: Establish reasonable short term and long term weight goals.    Admit Weight 145 lb (65.8 kg)    Goal Weight: Short Term 145 lb (65.8 kg)    Goal Weight: Long Term 145 lb (65.8 kg)    Expected Outcomes Short Term: Continue to assess and modify interventions until short term weight is achieved;Weight Maintenance: Understanding of the daily nutrition guidelines, which includes 25-35% calories from fat, 7% or less cal from saturated fats, less than 245m cholesterol, less than 1.5gm of sodium, & 5 or more servings of fruits and vegetables daily;Understanding recommendations for meals to include 15-35% energy as protein, 25-35% energy from fat, 35-60% energy from carbohydrates, less than 2058mof dietary cholesterol, 20-35 gm of total fiber daily;Understanding of distribution of calorie intake throughout the day with the consumption of 4-5 meals/snacks    Improve shortness of breath with ADL's Yes    Intervention Provide education, individualized exercise plan and daily activity instruction to help decrease symptoms of SOB with activities of daily living.    Expected Outcomes Short Term: Improve cardiorespiratory fitness to achieve a reduction of symptoms when performing ADLs;Long Term: Be able to perform more ADLs without symptoms or delay the onset of symptoms    Hypertension Yes    Intervention Provide education on lifestyle modifcations including regular physical activity/exercise, weight management,  moderate sodium restriction and increased consumption of fresh fruit, vegetables, and low fat dairy, alcohol moderation, and smoking cessation.;Monitor prescription use compliance.    Expected Outcomes Short Term: Continued assessment and intervention until BP is < 140/9084mG in hypertensive participants. < 130/22m59m in hypertensive participants with diabetes, heart failure or chronic kidney disease.;Long Term: Maintenance of blood pressure at goal levels.    Lipids Yes    Intervention Provide education and support for participant on nutrition & aerobic/resistive exercise along with prescribed medications to achieve LDL <70mg24mL >40mg.94mExpected Outcomes Short Term: Participant states understanding of desired cholesterol values and is compliant with medications prescribed. Participant is following exercise prescription and nutrition guidelines.;Long Term: Cholesterol controlled with medications as prescribed, with individualized exercise RX and with personalized nutrition plan. Value goals: LDL < 70mg,38m  HDL > 40 mg.             Education:Diabetes - Individual verbal and written instruction to review signs/symptoms of diabetes, desired ranges of glucose level fasting, after meals and with exercise. Acknowledge that pre and post exercise glucose checks will be done for 3 sessions at entry of program.   Know Your Numbers and Heart Failure: - Group verbal and visual instruction to discuss disease risk factors for cardiac and pulmonary disease and treatment options.  Reviews associated critical values for Overweight/Obesity, Hypertension, Cholesterol, and Diabetes.  Discusses basics of heart failure: signs/symptoms and treatments.  Introduces Heart Failure Zone chart for action plan for heart failure.  Written material given at graduation. Flowsheet Row Pulmonary Rehab from 11/13/2020 in [facility] Cardiac and Pulmonary Rehab  Date 08/21/20  Educator SB  Instruction Review Code 1- Verbalizes  Understanding       Core Components/Risk Factors/Patient Goals Review:   Goals and Risk Factor Review     Row Name 09/16/20 1024 10/14/20 1026 11/13/20 1006 12/04/20 1004       Core Components/Risk Factors/Patient Goals Review   Personal Goals Review Weight Management/Obesity;Hypertension;Improve shortness of breath with ADL's Weight Management/Obesity;Hypertension;Improve shortness of breath with ADL's Weight Management/Obesity;Hypertension;Improve shortness of breath with ADL's;Increase knowledge of respiratory medications and ability to use respiratory devices properly. Develop more efficient breathing techniques such as purse lipped breathing and diaphragmatic breathing and practicing self-pacing with activity.;Increase knowledge of respiratory medications and ability to use respiratory devices properly.    Review Ciani thinks she lost weight a little bit and will continue to check it at home and make note of any abnormal changes. Checks BP at home typically runs around 130s/70s and fairly stable at rehab. She is taking all medications as prescribed. Breathing still feels the same so far but she recently was diagnosed with COVID. She is hoping she will feel better the more she recovers. Emnet still checks her BP at home and is having problems with her BP increasing at night to around 237-628B systolic.  She talked to her doctor and they increased her medication several weeks ago but unfortunately she hasn't seen a change yet. She states her BP during the day is stable..She goes back to see her doctor in a couple of weeks to follow up. She plans to call them before that. In regards to her breathing, At home, she feels she can walk more at home than she used to before starting the program. She is pleased thus far. Sheridyn is doing well in rehab.  Her weight is holding steady.  Her pressures have gotten better and are not going up at night still.  She is doing well with her meds now.  Her pulmonary meds are  doing well too.  She notes that her shortness of breath has started to improve as she is able to do more. Zykeriah walks at [employer] on days not at [employer].  She has noticed she feels a little short of breath at the beginning , then it improves as she continues.  She uses PLB as needed.  She reports taking meds as directed.  BP has been good, not going up at night    Expected Outcomes Short: Continue to watch weight at home Long: Continue to manage lifestyle risk factors Short: Check with doctor on BP and keep monitoring Long: Maintain stable blood pressure throughout the day Short: Continue to keep close eye on pressures Long: Continue to monitor risk factors. Short: contineu to  use PLB and monitor pressure             Core Components/Risk Factors/Patient Goals at Discharge (Final Review):   Goals and Risk Factor Review - 12/04/20 1004       Core Components/Risk Factors/Patient Goals Review   Personal Goals Review Develop more efficient breathing techniques such as purse lipped breathing and diaphragmatic breathing and practicing self-pacing with activity.;Increase knowledge of respiratory medications and ability to use respiratory devices properly.    Review Davida walks at [employer] on days not at [employer].  She has noticed she feels a little short of breath at the beginning , then it improves as she continues.  She uses PLB as needed.  She reports taking meds as directed.  BP has been good, not going up at night    Expected Outcomes Short: contineu to use PLB and monitor pressure             ITP Comments:  ITP Comments     Row Name 08/04/20 1557 08/12/20 1141 08/14/20 0946 08/26/20 1014 09/03/20 0738   ITP Comments Initial telephone orientation completed. Diangosis can be found in [facility] 6/24. EP orientation scheduled for Tuesday 7/19 at 10am. Completed 6MWT and gym orientation. Initial ITP created and sent for review to Dr. Ottie Glazier, Medical Director. First full day of exercise!  Patient was  oriented to gym and equipment including functions, settings, policies, and procedures.  Patient's individual exercise prescription and treatment plan were reviewed.  All starting workloads were established based on the results of the 6 minute walk test done at initial orientation visit.  The plan for exercise progression was also introduced and progression will be customized based on patient's performance and goals. Completed initial RD evaluation 30 Day review completed. Medical Director ITP review done, changes made as directed, and signed approval by Medical Director.    [place] Name 09/11/20 0915 10/01/20 3567 10/29/20 0931 11/26/20 0721 12/24/20 0708   ITP Comments Elianie tested positive for COVID on 8/12 and will be out until cleared to come back 30 Day review completed. Medical Director ITP review done, changes made as directed, and signed approval by Medical Director. 30 day review completed. ITP sent to Dr. Zetta Bills, Medical Director of Pulmonary Rehab. Continue with ITP unless changes are made by physician. 30 Day review completed. Medical Director ITP review done, changes made as directed, and signed approval by Medical Director. 30 Day review completed. Medical Director ITP review done, changes made as directed, and signed approval by Medical Director.           Comments:

## 2020-12-25 ENCOUNTER — Other Ambulatory Visit: Payer: Self-pay

## 2020-12-25 ENCOUNTER — Encounter: Payer: Medicare Other | Attending: Pulmonary Disease

## 2020-12-25 DIAGNOSIS — J8410 Pulmonary fibrosis, unspecified: Secondary | ICD-10-CM | POA: Insufficient documentation

## 2020-12-25 NOTE — Patient Instructions (Signed)
Discharge Patient Instructions  Patient Details  Name: Susan Sosa MRN: [id number] Date of Birth: 1940-03-26 Referring Provider:  Ottie Glazier, MD   Number of Visits: 66  Reason for Discharge:  Patient reached a stable level of exercise. Patient independent in their exercise. Patient has met program and personal goals.  Smoking History:  Social History   Tobacco Use  Smoking Status Former  Smokeless Tobacco Never    Diagnosis:  Pulmonary fibrosis ([place])  Initial Exercise Prescription:  Initial Exercise Prescription - 08/12/20 1500       Date of Initial Exercise RX and Referring Provider   Date 08/12/20    Referring Provider Dr. Lanney Gins, Luvenia Heller MD      Treadmill   MPH 1.5    Grade 0    Minutes 15    METs 2.15      Recumbant Bike   Level 1    RPM 60    Watts 10    Minutes 15    METs 1.08      NuStep   Level 1    SPM 80    Minutes 15    METs 1.08      T5 Nustep   Level 1    SPM 80    Minutes 15    METs 1.08      Prescription Details   Frequency (times per week) 2    Duration Progress to 30 minutes of continuous aerobic without signs/symptoms of physical distress      Intensity   THRR 40-80% of Max Heartrate 95-124    Ratings of Perceived Exertion 11-13    Perceived Dyspnea 0-4      Progression   Progression Continue to progress workloads to maintain intensity without signs/symptoms of physical distress.      Resistance Training   Training Prescription Yes    Weight 3 lb    Reps 10-15             Discharge Exercise Prescription (Final Exercise Prescription Changes):  Exercise Prescription Changes - 12/17/20 1500       Response to Exercise   Blood Pressure (Admit) 106/66    Blood Pressure (Exit) 112/66    Heart Rate (Admit) 72 bpm    Heart Rate (Exercise) 98 bpm    Heart Rate (Exit) 78 bpm    Oxygen Saturation (Admit) 99 %    Oxygen Saturation (Exercise) 92 %    Oxygen Saturation (Exit) 95 %    Rating of Perceived Exertion  (Exercise) 12    Perceived Dyspnea (Exercise) 0    Symptoms none    Duration Continue with 30 min of aerobic exercise without signs/symptoms of physical distress.    Intensity THRR unchanged      Progression   Progression Continue to progress workloads to maintain intensity without signs/symptoms of physical distress.    Average METs 2.6      Resistance Training   Training Prescription Yes    Weight 4 lb    Reps 10-15      Interval Training   Interval Training No      Treadmill   MPH 1.7    Grade 3    Minutes 15    METs 3.01      T5 Nustep   Level 2    Minutes 15      Home Exercise Plan   Plans to continue exercise at [employer] (comment)   walking at the mall; staff videos   Frequency Add 2 additional  days to program exercise sessions.    Initial Home Exercises Provided 09/04/20             Functional Capacity:  6 Minute Walk     Row Name 08/12/20 1158 12/04/20 1003       6 Minute Walk   Phase Initial Discharge    Distance 500 feet 785 feet    Distance % Change -- 57 %    Distance Feet Change -- 285 ft    Walk Time 3.5 minutes  Stopped at 3:30 due to O2 sats 5.25 minutes    # of Rest Breaks 0 0    MPH 1.62 1.7    METS 1.08 1.41    RPE 12 13    Perceived Dyspnea  2 1    VO2 Peak 3.78 4.94    Symptoms No Yes (comment)    Comments -- SOB    Resting HR 67 bpm 73 bpm    Resting BP 130/62 134/62    Resting Oxygen Saturation  97 % 92 %    Exercise Oxygen Saturation  during 6 min walk 79 % 75 %    Max Ex. HR 111 bpm 113 bpm    Max Ex. BP 162/68 144/80    2 Minute Post BP 134/68 124/72      Interval HR   1 Minute HR 87 103    2 Minute HR 101 113    3 Minute HR 111  Stopped at 3:30 due to O2 < 80% 94    4 Minute HR -- 101    5 Minute HR -- 108    6 Minute HR -- 110    2 Minute Post HR -- 76    Interval Heart Rate? Yes Yes      Interval Oxygen   Interval Oxygen? Yes Yes    Baseline Oxygen Saturation % 97 % 92 %    1 Minute Oxygen  Saturation % 93 % 91 %    1 Minute Liters of Oxygen 2 L 3 L  pulsed    2 Minute Oxygen Saturation % 87 % 78 %    2 Minute Liters of Oxygen 2 L 3 L    3 Minute Oxygen Saturation % 79 % 75 %    3 Minute Liters of Oxygen 2 L 3 L    4 Minute Oxygen Saturation % --  stopped 96 %    4 Minute Liters of Oxygen -- 3 L    5 Minute Oxygen Saturation % --  stopped 84 %    5 Minute Liters of Oxygen -- 3 L    6 Minute Oxygen Saturation % --  stopped 78 %    6 Minute Liters of Oxygen -- 3 L    2 Minute Post Oxygen Saturation % 97 % 97 %    2 Minute Post Liters of Oxygen 3 L 3 L                Nutrition & Weight - Outcomes:  Pre Biometrics - 08/12/20 1157       Pre Biometrics   Height 5' (1.524 m)    Weight 145 lb 11.2 oz (66.1 kg)    BMI (Calculated) 28.46    Single Leg Stand 10.03 seconds             Post Biometrics - 12/04/20 1007        Post  Biometrics   Height 5' (1.524 m)    Weight   143 lb 12.8 oz (65.2 kg)    BMI (Calculated) 28.08    Single Leg Stand 10.34 seconds             Nutrition:  Nutrition Therapy & Goals - 08/26/20 0920       Nutrition Therapy   Diet Heart healthy, low Na, pulmonary MNT    Drug/Food Interactions Statins/Certain Fruits    Protein (specify units) 80g    Fiber 25 grams    Whole Grain Foods 3 servings    Saturated Fats 12 max. grams    Fruits and Vegetables 8 servings/day    Sodium 1.5 grams      Personal Nutrition Goals   Nutrition Goal ST: try soymilk, add avocado or peanut butter to lunch, swap out protein shake (Ensure, Boost, Orgain) LT: maintain weight    Comments B: cereal (frosted flakes and mini wheats and almond milk) or sausage biscuit S: premier protein L: not hungry - she thinks this is due to her medication. 1-2 eggs and bread D: meat (chicken, steak, limits her pork intake (she feels she is allergic) - grilled)and some vegetables (peas, pinto beans, beets, corn, small amounts of potato - if she does it is a sweet  potato, spinach, turnip greens - little grapeseed oil) S: apple. Drinks: water.  She will cook beans from dried. Does not cook with salt. She has a lower appetite - has been for years. She has lost 30 lbs over 3-4 months (2 years ago) - gained 10 pounds back. She drinks premier protein which has recently been recalled - provided information on lot numbers and encourgaed her to check her products - suggested swapping and trying Ensure, Orgain, or Boost. suggested swapping almond milk for fortified soymilk and adding avocados or peanut butter to her lunch. Discussed heart healthy eating and pulmonary friendly eating.      Intervention Plan   Intervention Prescribe, educate and counsel regarding individualized specific dietary modifications aiming towards targeted core components such as weight, hypertension, lipid management, diabetes, heart failure and other comorbidities.;Nutrition handout(s) given to patient.    Expected Outcomes Short Term Goal: Understand basic principles of dietary content, such as calories, fat, sodium, cholesterol and nutrients.;Short Term Goal: A plan has been developed with personal nutrition goals set during dietitian appointment.;Long Term Goal: Adherence to prescribed nutrition plan.              Goals reviewed with patient; copy given to patient. 

## 2020-12-25 NOTE — Progress Notes (Signed)
Daily Session Note  Patient Details  Name: Susan Sosa MRN: [id number] Date of Birth: December 01, 1940 Referring Provider:   Flowsheet Row Pulmonary Rehab from 08/12/2020 in [facility] Cardiac and Pulmonary Rehab  Referring Provider Dr. Ottie Glazier MD       Encounter Date: 12/25/2020  Check In:  Session Check In - 12/25/20 1001       Check-In   Supervising physician immediately available to respond to emergencies See telemetry face sheet for immediately available ER MD    Location [facility]-Cardiac & Pulmonary Rehab    Staff Present Birdie Sons, MPA, Elveria Rising, BA, ACSM CEP, Exercise Physiologist;Naveed Humphres Amedeo Plenty, BS, ACSM CEP, Exercise Physiologist;Melissa Caiola, RDN, LDN    Virtual Visit No    Medication changes reported     No    Fall or balance concerns reported    No    Tobacco Cessation No Change    Warm-up and Cool-down Performed on first and last piece of equipment    Resistance Training Performed Yes    VAD Patient? No    PAD/SET Patient? No      Pain Assessment   Currently in Pain? No/denies                Social History   Tobacco Use  Smoking Status Former  Smokeless Tobacco Never    Goals Met:  Independence with exercise equipment Exercise tolerated well No report of concerns or symptoms today Strength training completed today  Goals Unmet:  Not Applicable  Comments: Pt able to follow exercise prescription today without complaint.  Will continue to monitor for progression.    Dr. Emily Filbert is Medical Director for [facility].  Dr. Ottie Glazier is Medical Director for [facility] Pulmonary Rehabilitation.

## 2020-12-30 ENCOUNTER — Other Ambulatory Visit: Payer: Self-pay

## 2020-12-30 ENCOUNTER — Encounter: Payer: Medicare Other | Admitting: *Deleted

## 2020-12-30 DIAGNOSIS — J8410 Pulmonary fibrosis, unspecified: Secondary | ICD-10-CM | POA: Diagnosis not present

## 2020-12-30 NOTE — Progress Notes (Signed)
Daily Session Note  Patient Details  Name: Susan Sosa MRN: [id number] Date of Birth: 03/08/1940 Referring Provider:   Flowsheet Row Pulmonary Rehab from 08/12/2020 in [facility] Cardiac and Pulmonary Rehab  Referring Provider Dr. Ottie Glazier MD       Encounter Date: 12/30/2020  Check In:  Session Check In - 12/30/20 1155       Check-In   Supervising physician immediately available to respond to emergencies See telemetry face sheet for immediately available ER MD    Location [facility]-Cardiac & Pulmonary Rehab    Staff Present Nyoka Cowden, RN, BSN, Ardeth Sportsman, RDN, Rowe Pavy, BA, ACSM CEP, Exercise Physiologist    Virtual Visit No    Medication changes reported     No    Fall or balance concerns reported    No    Tobacco Cessation No Change    Warm-up and Cool-down Performed on first and last piece of equipment    Resistance Training Performed Yes    VAD Patient? No    PAD/SET Patient? No      Pain Assessment   Currently in Pain? No/denies                Social History   Tobacco Use  Smoking Status Former  Smokeless Tobacco Never    Goals Met:  Independence with exercise equipment Exercise tolerated well No report of concerns or symptoms today  Goals Unmet:  Not Applicable  Comments: Pt able to follow exercise prescription today without complaint.  Will continue to monitor for progression.    Dr. Emily Filbert is Medical Director for [facility].  Dr. Ottie Glazier is Medical Director for [facility] Pulmonary Rehabilitation.

## 2020-12-30 NOTE — Progress Notes (Signed)
Pulmonary Individual Treatment Plan  Patient Details  Name: Susan Sosa MRN: [id number] Date of Birth: 10/10/1940 Referring Provider:   Flowsheet Row Pulmonary Rehab from 08/12/2020 in [facility] Cardiac and Pulmonary Rehab  Referring Provider Dr. Ottie Glazier MD       Initial Encounter Date:  Flowsheet Row Pulmonary Rehab from 08/12/2020 in [facility] Cardiac and Pulmonary Rehab  Date 08/12/20       Visit Diagnosis: Pulmonary fibrosis ([place])  Patient's Home Medications on Admission:  Current Outpatient Medications:    albuterol (PROVENTIL) (2.5 MG/3ML) 0.083% nebulizer solution, Inhale into the lungs., Disp: , Rfl:    ascorbic acid (VITAMIN C) 500 MG tablet, ascorbic acid (vitamin C) 500 mg tablet   1by oral route., Disp: , Rfl:    aspirin 81 MG chewable tablet, aspirin 81 mg chewable tablet   81 mg by oral route., Disp: , Rfl:    atorvastatin (LIPITOR) 40 MG tablet, Take 1 tablet by mouth daily., Disp: , Rfl:    calcium-vitamin D (OSCAL WITH D) 500-200 MG-UNIT TABS tablet, Take 1 tablet by mouth daily., Disp: , Rfl:    cyanocobalamin 1000 MCG tablet, cyanocobalamin (vit B-12) 1,000 mcg tablet   1000 ugs by oral route., Disp: , Rfl:    esomeprazole (NEXIUM) 40 MG capsule, Take 1 capsule by mouth at bedtime., Disp: , Rfl:    hydrALAZINE (APRESOLINE) 10 MG tablet, TAKE 1 TABLET BY MOUTH NIGHTLY AS NEEDED, Disp: , Rfl:    levalbuterol (XOPENEX) 1.25 MG/3ML nebulizer solution, Inhale into the lungs., Disp: , Rfl:    losartan (COZAAR) 50 MG tablet, 1 tab by mouth daily around 5pm, Disp: , Rfl:    magnesium oxide (MAG-OX) 400 MG tablet, magnesium oxide 400 mg (241.3 mg magnesium) tablet   400 mg twice a day by oral route., Disp: , Rfl:    multivitamin-iron-minerals-folic acid (CENTRUM) chewable tablet, Chew by mouth., Disp: , Rfl:    naproxen sodium (ALEVE) 220 MG tablet, naproxen sodium 220 mg tablet   440 mg twice a day by oral route., Disp: , Rfl:    Pirfenidone (ESBRIET) 267 MG CAPS, Take by  mouth., Disp: , Rfl:    prednisoLONE acetate (PRED FORTE) 1 % ophthalmic suspension, prednisolone acetate 1 % eye drops,suspension  INSTILL 1 DROP INTO LEFT EYE 3 TIMES A DAY, Disp: , Rfl:    triamcinolone cream (KENALOG) 0.1 %, APPLY TO AFFECTED AREA TWICE A DAY, Disp: , Rfl:    triamterene-hydrochlorothiazide (MAXZIDE) 75-50 MG tablet, TAKE 1 BY MOUTH EVERY DAY., Disp: , Rfl:    verapamil (CALAN-SR) 180 MG CR tablet, Take by mouth., Disp: , Rfl:    vitamin E 180 MG (400 UNITS) capsule, vitamin E 268 mg (400 unit) capsule   400 units by oral route., Disp: , Rfl:   Past Medical History: Past Medical History:  Diagnosis Date   Hypertension     Tobacco Use: Social History   Tobacco Use  Smoking Status Former  Smokeless Tobacco Never    Labs: Recent Review Flowsheet Data   There is no flowsheet data to display.      Pulmonary Assessment Scores:  Pulmonary Assessment Scores     Row Name 08/12/20 1440 12/04/20 1007 12/23/20 1008     ADL UCSD   ADL Phase Entry Exit Exit   SOB Score total 23 -- 56   Rest 0 -- 1   Walk 1 -- 2   Stairs 3 -- 3   Bath 1 -- 3   Dress  1 -- 2   Shop 1 -- 3     CAT Score   CAT Score 11 -- 11     mMRC Score   mMRC Score 1 1 --            UCSD: Self-administered rating of dyspnea associated with activities of daily living (ADLs) 6-point scale (0 = "not at all" to 5 = "maximal or unable to do because of breathlessness")  Scoring Scores range from 0 to 120.  Minimally important difference is 5 units  CAT: CAT can identify the health impairment of COPD patients and is better correlated with disease progression.  CAT has a scoring range of zero to 40. The CAT score is classified into four groups of low (less than 10), medium (10 - 20), high (21-30) and very high (31-40) based on the impact level of disease on health status. A CAT score over 10 suggests significant symptoms.  A worsening CAT score could be explained by an exacerbation, poor  medication adherence, poor inhaler technique, or progression of COPD or comorbid conditions.  CAT MCID is 2 points  mMRC: mMRC (Modified Medical Research Council) Dyspnea Scale is used to assess the degree of baseline functional disability in patients of respiratory disease due to dyspnea. No minimal important difference is established. A decrease in score of 1 point or greater is considered a positive change.   Pulmonary Function Assessment:   Exercise Target Goals: Exercise Program Goal: Individual exercise prescription set using results from initial 6 min walk test and THRR while considering  patient's activity barriers and safety.   Exercise Prescription Goal: Initial exercise prescription builds to 30-45 minutes a day of aerobic activity, 2-3 days per week.  Home exercise guidelines will be given to patient during program as part of exercise prescription that the participant will acknowledge.  Education: Aerobic Exercise: - Group verbal and visual presentation on the components of exercise prescription. Introduces F.I.T.T principle from ACSM for exercise prescriptions.  Reviews F.I.T.T. principles of aerobic exercise including progression. Written material given at graduation. Flowsheet Row Pulmonary Rehab from 11/13/2020 in [facility] Cardiac and Pulmonary Rehab  Date 09/18/20  Educator [facility]  Instruction Review Code 1- Verbalizes Understanding       Education: Resistance Exercise: - Group verbal and visual presentation on the components of exercise prescription. Introduces F.I.T.T principle from ACSM for exercise prescriptions  Reviews F.I.T.T. principles of resistance exercise including progression. Written material given at graduation. Flowsheet Row Pulmonary Rehab from 11/13/2020 in [facility] Cardiac and Pulmonary Rehab  Date 09/25/20  Educator [facility]  Instruction Review Code 1- Verbalizes Understanding        Education: Exercise & Equipment Safety: - Individual verbal instruction and  demonstration of equipment use and safety with use of the equipment. Flowsheet Row Pulmonary Rehab from 11/13/2020 in [facility] Cardiac and Pulmonary Rehab  Education need identified 08/12/20  Date 08/12/20  Educator [place]  Instruction Review Code 1- Verbalizes Understanding       Education: Exercise Physiology & General Exercise Guidelines: - Group verbal and written instruction with models to review the exercise physiology of the cardiovascular system and associated critical values. Provides general exercise guidelines with specific guidelines to those with heart or lung disease.  Flowsheet Row Pulmonary Rehab from 11/13/2020 in [facility] Cardiac and Pulmonary Rehab  Date 11/13/20  Educator [facility]  Instruction Review Code 1- Verbalizes Understanding       Education: Flexibility, Balance, Mind/Body Relaxation: - Group verbal and visual presentation with interactive activity on  the components of exercise prescription. Introduces F.I.T.T principle from ACSM for exercise prescriptions. Reviews F.I.T.T. principles of flexibility and balance exercise training including progression. Also discusses the mind body connection.  Reviews various relaxation techniques to help reduce and manage stress (i.e. Deep breathing, progressive muscle relaxation, and visualization). Balance handout provided to take home. Written material given at graduation. Flowsheet Row Pulmonary Rehab from 11/13/2020 in [facility] Cardiac and Pulmonary Rehab  Date 10/02/20  Educator [facility]  Instruction Review Code 1- Verbalizes Understanding       Activity Barriers & Risk Stratification:  Activity Barriers & Cardiac Risk Stratification - 08/12/20 1158       Activity Barriers & Cardiac Risk Stratification   Activity Barriers Shortness of Breath;Joint Problems;Deconditioning;Back Problems;Muscular Weakness;Other (comment)    Comments Right knee- gets cortisone shots             6 Minute Walk:  6 Minute Walk     Row Name 08/12/20 1158  12/04/20 1003       6 Minute Walk   Phase Initial Discharge    Distance 500 feet 785 feet    Distance % Change -- 57 %    Distance Feet Change -- 285 ft    Walk Time 3.5 minutes  Stopped at 3:30 due to O2 sats 5.25 minutes    # of Rest Breaks 0 0    MPH 1.62 1.7    METS 1.08 1.41    RPE 12 13    Perceived Dyspnea  2 1    VO2 Peak 3.78 4.94    Symptoms No Yes (comment)    Comments -- SOB    Resting HR 67 bpm 73 bpm    Resting BP 130/62 134/62    Resting Oxygen Saturation  97 % 92 %    Exercise Oxygen Saturation  during 6 min walk 79 % 75 %    Max Ex. HR 111 bpm 113 bpm    Max Ex. BP 162/68 144/80    2 Minute Post BP 134/68 124/72      Interval HR   1 Minute HR 87 103    2 Minute HR 101 113    3 Minute HR 111  Stopped at 3:30 due to O2 < 80% 94    4 Minute HR -- 101    5 Minute HR -- 108    6 Minute HR -- 110    2 Minute Post HR -- 76    Interval Heart Rate? Yes Yes      Interval Oxygen   Interval Oxygen? Yes Yes    Baseline Oxygen Saturation % 97 % 92 %    1 Minute Oxygen Saturation % 93 % 91 %    1 Minute Liters of Oxygen 2 L 3 L  pulsed    2 Minute Oxygen Saturation % 87 % 78 %    2 Minute Liters of Oxygen 2 L 3 L    3 Minute Oxygen Saturation % 79 % 75 %    3 Minute Liters of Oxygen 2 L 3 L    4 Minute Oxygen Saturation % --  stopped 96 %    4 Minute Liters of Oxygen -- 3 L    5 Minute Oxygen Saturation % --  stopped 84 %    5 Minute Liters of Oxygen -- 3 L    6 Minute Oxygen Saturation % --  stopped 78 %    6 Minute Liters of Oxygen -- 3 L  2 Minute Post Oxygen Saturation % 97 % 97 %    2 Minute Post Liters of Oxygen 3 L 3 L            Oxygen Initial Assessment:  Oxygen Initial Assessment - 10/14/20 1045       Home Oxygen   Home Oxygen Device Home Concentrator;Portable Concentrator    Sleep Oxygen Prescription Continuous    Liters per minute 2    Home Exercise Oxygen Prescription Continuous    Liters per minute 2    Home Resting Oxygen  Prescription Continuous    Liters per minute 2    Compliance with Home Oxygen Use Yes      Initial 6 min Walk   Oxygen Used Continuous    Liters per minute 2      Program Oxygen Prescription   Program Oxygen Prescription Continuous    Liters per minute 2    Comments Susan Sosa feels better when walking as she feels she can go longer now. She still uses PLB when needed and states her O2 never drops below 88%.      Intervention   Short Term Goals To learn and demonstrate proper pursed lip breathing techniques or other breathing techniques. ;To learn and understand importance of maintaining oxygen saturations>88%;To learn and understand importance of monitoring SPO2 with pulse oximeter and demonstrate accurate use of the pulse oximeter.    Long  Term Goals Exhibits proper breathing techniques, such as pursed lip breathing or other method taught during program session;Maintenance of O2 saturations>88%;Verbalizes importance of monitoring SPO2 with pulse oximeter and return demonstration             Oxygen Re-Evaluation:  Oxygen Re-Evaluation     Row Name 08/14/20 0947 09/16/20 1021 11/13/20 1008 12/04/20 1007       Program Oxygen Prescription   Program Oxygen Prescription Continuous Continuous Continuous Continuous    Liters per minute '2 2 3 ' --      Home Oxygen   Home Oxygen Device Home Concentrator;Portable Concentrator Home Concentrator;Portable Concentrator Home Concentrator;Portable Concentrator Home Concentrator;Portable Concentrator    Sleep Oxygen Prescription Continuous Continuous Continuous Continuous    Liters per minute '2 2 2 ' --    Home Exercise Oxygen Prescription Continuous -- Continuous Continuous    Liters per minute 2 -- 3 3    Home Resting Oxygen Prescription Continuous Continuous Continuous Continuous    Liters per minute 2 -- 2 2    Compliance with Home Oxygen Use Yes Yes Yes Yes      Goals/Expected Outcomes   Short Term Goals To learn and demonstrate proper pursed  lip breathing techniques or other breathing techniques.  To learn and demonstrate proper pursed lip breathing techniques or other breathing techniques.  To learn and demonstrate proper pursed lip breathing techniques or other breathing techniques. ;To learn and understand importance of maintaining oxygen saturations>88%;To learn and understand importance of monitoring SPO2 with pulse oximeter and demonstrate accurate use of the pulse oximeter.;To learn and exhibit compliance with exercise, home and travel O2 prescription;To learn and demonstrate proper use of respiratory medications To learn and demonstrate proper pursed lip breathing techniques or other breathing techniques. ;To learn and understand importance of maintaining oxygen saturations>88%;To learn and understand importance of monitoring SPO2 with pulse oximeter and demonstrate accurate use of the pulse oximeter.;To learn and exhibit compliance with exercise, home and travel O2 prescription;To learn and demonstrate proper use of respiratory medications    Long  Term Goals Exhibits proper breathing  techniques, such as pursed lip breathing or other method taught during program session Exhibits proper breathing techniques, such as pursed lip breathing or other method taught during program session Maintenance of O2 saturations>88%;Verbalizes importance of monitoring SPO2 with pulse oximeter and return demonstration;Exhibits compliance with exercise, home  and travel O2 prescription;Compliance with respiratory medication;Exhibits proper breathing techniques, such as pursed lip breathing or other method taught during program session;Demonstrates proper use of MDI's Maintenance of O2 saturations>88%;Verbalizes importance of monitoring SPO2 with pulse oximeter and return demonstration;Exhibits compliance with exercise, home  and travel O2 prescription;Compliance with respiratory medication;Exhibits proper breathing techniques, such as pursed lip breathing or other  method taught during program session;Demonstrates proper use of MDI's    Comments Reviewed PLB technique with pt.  Talked about how it works and it's importance in maintaining their exercise saturations.      Short: Become more profiecient at using PLB.   Long: Become independent at using PLB. Susan Sosa is still utilizing PLB when needed. She checks her O2 at home- she admits only 1 time only dropping to 88%. Susan Sosa is doing well in rehab. She is now using 3L for exercise as she feels better with her breathing on this.  She is doing well with her PLB and feels like her breathing is getting better.  She is staying compliant with her oxygen Susan Sosa reports using oxygen at home consistently and oxygen sats stay above 88%    Goals/Expected Outcomes Short: Become more profiecient at using PLB.   Long: Become independent at using PLB. -- Short: Continue to use her oxygen for exercise and PLB  Long: Continue to stay compliant. Short/Long: maintain compliance with oxygen use             Oxygen Discharge (Final Oxygen Re-Evaluation):  Oxygen Re-Evaluation - 12/04/20 1007       Program Oxygen Prescription   Program Oxygen Prescription Continuous      Home Oxygen   Home Oxygen Device Home Concentrator;Portable Concentrator    Sleep Oxygen Prescription Continuous    Home Exercise Oxygen Prescription Continuous    Liters per minute 3    Home Resting Oxygen Prescription Continuous    Liters per minute 2    Compliance with Home Oxygen Use Yes      Goals/Expected Outcomes   Short Term Goals To learn and demonstrate proper pursed lip breathing techniques or other breathing techniques. ;To learn and understand importance of maintaining oxygen saturations>88%;To learn and understand importance of monitoring SPO2 with pulse oximeter and demonstrate accurate use of the pulse oximeter.;To learn and exhibit compliance with exercise, home and travel O2 prescription;To learn and demonstrate proper use of respiratory  medications    Long  Term Goals Maintenance of O2 saturations>88%;Verbalizes importance of monitoring SPO2 with pulse oximeter and return demonstration;Exhibits compliance with exercise, home  and travel O2 prescription;Compliance with respiratory medication;Exhibits proper breathing techniques, such as pursed lip breathing or other method taught during program session;Demonstrates proper use of MDI's    Comments Susan Sosa reports using oxygen at home consistently and oxygen sats stay above 88%    Goals/Expected Outcomes Short/Long: maintain compliance with oxygen use             Initial Exercise Prescription:  Initial Exercise Prescription - 08/12/20 1500       Date of Initial Exercise RX and Referring Provider   Date 08/12/20    Referring Provider Dr. Ottie Glazier MD      Treadmill   MPH 1.5  Grade 0    Minutes 15    METs 2.15      Recumbant Bike   Level 1    RPM 60    Watts 10    Minutes 15    METs 1.08      NuStep   Level 1    SPM 80    Minutes 15    METs 1.08      T5 Nustep   Level 1    SPM 80    Minutes 15    METs 1.08      Prescription Details   Frequency (times per week) 2    Duration Progress to 30 minutes of continuous aerobic without signs/symptoms of physical distress      Intensity   THRR 40-80% of Max Heartrate 95-124    Ratings of Perceived Exertion 11-13    Perceived Dyspnea 0-4      Progression   Progression Continue to progress workloads to maintain intensity without signs/symptoms of physical distress.      Resistance Training   Training Prescription Yes    Weight 3 lb    Reps 10-15             Perform Capillary Blood Glucose checks as needed.  Exercise Prescription Changes:   Exercise Prescription Changes     Row Name 08/12/20 1500 08/27/20 1100 09/11/20 0900 09/23/20 1400 10/06/20 1000     Response to Exercise   Blood Pressure (Admit) 130/62 128/82 118/64 136/74 142/80   Blood Pressure (Exercise) 162/68 132/74 132/62 --  --   Blood Pressure (Exit) 134/66 122/60 104/64 104/58 110/64   Heart Rate (Admit) 67 bpm 71 bpm 74 bpm 75 bpm 67 bpm   Heart Rate (Exercise) 111 bpm 99 bpm 101 bpm 101 bpm 103 bpm   Heart Rate (Exit) 72 bpm 82 bpm 85 bpm 86 bpm 81 bpm   Oxygen Saturation (Admit) 97 % 96 % 92 % 98 % 98 %   Oxygen Saturation (Exercise) 79 % 94 % 88 % 93 % 90 %   Oxygen Saturation (Exit) 97 % 98 % -- 99 % 98 %   Rating of Perceived Exertion (Exercise) '12 12 13 13 13   ' Perceived Dyspnea (Exercise) 2 1 0 2 0   Symptoms none none none SOB none   Comments walk test results -- -- -- --   Duration -- Progress to 30 minutes of  aerobic without signs/symptoms of physical distress Progress to 30 minutes of  aerobic without signs/symptoms of physical distress Continue with 30 min of aerobic exercise without signs/symptoms of physical distress. Continue with 30 min of aerobic exercise without signs/symptoms of physical distress.   Intensity -- THRR unchanged THRR unchanged THRR unchanged THRR unchanged     Progression   Progression -- Continue to progress workloads to maintain intensity without signs/symptoms of physical distress. Continue to progress workloads to maintain intensity without signs/symptoms of physical distress. Continue to progress workloads to maintain intensity without signs/symptoms of physical distress. Continue to progress workloads to maintain intensity without signs/symptoms of physical distress.   Average METs -- 1.8 2.06 2.3 2.38     Resistance Training   Training Prescription -- Yes Yes Yes Yes   Weight -- 3 lb 3 lb 3 lb 3 lb   Reps -- 10-15 10-15 10-15 10-15     Interval Training   Interval Training -- No No No No     Treadmill   MPH -- 1.5 1.7 1.8 1.7  Grade -- 0 2.5 1.5 2.5   Minutes -- '15 15 15 15   ' METs -- 2.15 2.89 2.75 2.89     NuStep   Level -- 1 3 -- 3   Minutes -- 15 15 -- 15   METs -- 1.8 1.7 -- 1.7     Arm Ergometer   Level -- -- -- -- 2   Minutes -- -- -- -- 15    METs -- -- -- -- 1     T5 Nustep   Level -- -- -- 3 --   Minutes -- -- -- 15 --   METs -- -- -- 1.7 --     Home Exercise Plan   Plans to continue exercise at -- -- [employer] (comment)  walking at the mall; [employer] (comment)  walking at the mall; [employer] (comment)  walking at the mall; staff videos   Frequency -- -- Add 2 additional days to program exercise sessions. Add 2 additional days to program exercise sessions. Add 2 additional days to program exercise sessions.   Initial Home Exercises Provided -- -- 09/04/20 09/04/20 09/04/20    Row Name 10/22/20 1400 11/03/20 1400 11/19/20 1300 12/01/20 0900 12/17/20 1500     Response to Exercise   Blood Pressure (Admit) 138/58 126/78 112/60 112/60 106/66   Blood Pressure (Exit) 112/60 114/62 102/60 104/56 112/66   Heart Rate (Admit) 69 bpm 54 bpm 84 bpm 76 bpm 72 bpm   Heart Rate (Exercise) 104 bpm 97 bpm 112 bpm 103 bpm 98 bpm   Heart Rate (Exit) 92 bpm 82 bpm 94 bpm 83 bpm 78 bpm   Oxygen Saturation (Admit) 95 % 96 % 98 % 97 % 99 %   Oxygen Saturation (Exercise) 97 % 94 % 91 % 96 % 92 %   Oxygen Saturation (Exit) 98 % 98 % 98 % 98 % 95 %   Rating of Perceived Exertion (Exercise) '13 13 13 13 12   ' Perceived Dyspnea (Exercise) 0 1 0 1 0   Symptoms none none none none none   Duration Continue with 30 min of aerobic exercise without signs/symptoms of physical distress. Continue with 30 min of aerobic exercise without signs/symptoms of physical distress. Continue with 30 min of aerobic exercise without signs/symptoms of physical distress. Continue with 30 min of aerobic exercise without signs/symptoms of physical distress. Continue with 30 min of aerobic exercise without signs/symptoms of physical distress.   Intensity THRR unchanged THRR unchanged THRR unchanged THRR unchanged THRR unchanged     Progression   Progression Continue to progress workloads to maintain intensity without  signs/symptoms of physical distress. Continue to progress workloads to maintain intensity without signs/symptoms of physical distress. Continue to progress workloads to maintain intensity without signs/symptoms of physical distress. Continue to progress workloads to maintain intensity without signs/symptoms of physical distress. Continue to progress workloads to maintain intensity without signs/symptoms of physical distress.   Average METs 2.28 2.27 2.5 2.45 2.6     Resistance Training   Training Prescription Yes Yes Yes Yes Yes   Weight 3 lb 4 lb 4 lb 4 lb 4 lb   Reps 10-15 10-15 10-15 10-15 10-15     Interval Training   Interval Training No No No No No     Treadmill   MPH 1.8 1.9 1.5 1.7 1.7   Grade 2.5 2.5 3 2.5 3   Minutes '15 15 15 15 15   ' METs 3 3.11 2.77  2.89 3.01     NuStep   Level -- 6 -- 6 --   Minutes -- 15 -- 15 --   METs -- 1.8 -- 1.9 --     T5 Nustep   Level '3 3 3 2 2   ' Minutes '15 15 15 15 15   ' METs 1.8 1.8 -- 1.8 --     Home Exercise Plan   Plans to continue exercise at [employer] (comment)  walking at the mall; [employer] (comment)  walking at the mall; [employer] (comment)  walking at the mall; [employer] (comment)  walking at the mall; [employer] (comment)  walking at the mall; staff videos   Frequency Add 2 additional days to program exercise sessions. Add 2 additional days to program exercise sessions. Add 2 additional days to program exercise sessions. Add 2 additional days to program exercise sessions. Add 2 additional days to program exercise sessions.   Initial Home Exercises Provided 09/04/20 09/04/20 09/04/20 09/04/20 09/04/20    Row Name 12/29/20 1200             Response to Exercise   Blood Pressure (Admit) 122/60       Blood Pressure (Exit) 108/60       Heart Rate (Admit) 93 bpm       Heart Rate (Exercise) 100 bpm       Heart Rate (Exit) 77 bpm        Oxygen Saturation (Admit) 98 %       Oxygen Saturation (Exercise) 93 %       Oxygen Saturation (Exit) 98 %       Rating of Perceived Exertion (Exercise) 12       Perceived Dyspnea (Exercise) 0       Symptoms none       Duration Continue with 30 min of aerobic exercise without signs/symptoms of physical distress.       Intensity THRR unchanged         Progression   Progression Continue to progress workloads to maintain intensity without signs/symptoms of physical distress.       Average METs 3         Resistance Training   Training Prescription Yes       Weight 4 lb       Reps 10-15         Interval Training   Interval Training No         Oxygen   Oxygen Continuous       Liters 4         Treadmill   MPH 1.7       Grade 3       Minutes 15       METs 3         NuStep   Level 6       Minutes 15         Home Exercise Plan   Plans to continue exercise at [employer] (comment)  walking at the mall; staff videos       Frequency Add 2 additional days to program exercise sessions.       Initial Home Exercises Provided 09/04/20         Oxygen   Maintain Oxygen Saturation 88% or higher                Exercise Comments:   Exercise Comments     Row Name 08/14/20  0946           Exercise Comments First full day of exercise!  Patient was oriented to gym and equipment including functions, settings, policies, and procedures.  Patient's individual exercise prescription and treatment plan were reviewed.  All starting workloads were established based on the results of the 6 minute walk test done at initial orientation visit.  The plan for exercise progression was also introduced and progression will be customized based on patient's performance and goals.                Exercise Goals and Review:   Exercise Goals     Row Name 08/12/20 1509             Exercise Goals   Increase Physical Activity Yes       Intervention Provide advice, education, support and  counseling about physical activity/exercise needs.;Develop an individualized exercise prescription for aerobic and resistive training based on initial evaluation findings, risk stratification, comorbidities and participant's personal goals.       Expected Outcomes Long Term: Add in home exercise to make exercise part of routine and to increase amount of physical activity.;Short Term: Attend rehab on a regular basis to increase amount of physical activity.;Long Term: Exercising regularly at least 3-5 days a week.       Increase Strength and Stamina Yes       Intervention Provide advice, education, support and counseling about physical activity/exercise needs.;Develop an individualized exercise prescription for aerobic and resistive training based on initial evaluation findings, risk stratification, comorbidities and participant's personal goals.       Expected Outcomes Short Term: Increase workloads from initial exercise prescription for resistance, speed, and METs.;Short Term: Perform resistance training exercises routinely during rehab and add in resistance training at home;Long Term: Improve cardiorespiratory fitness, muscular endurance and strength as measured by increased METs and functional capacity (6MWT)       Able to understand and use rate of perceived exertion (RPE) scale Yes       Intervention Provide education and explanation on how to use RPE scale       Expected Outcomes Short Term: Able to use RPE daily in rehab to express subjective intensity level;Long Term:  Able to use RPE to guide intensity level when exercising independently       Able to understand and use Dyspnea scale Yes       Intervention Provide education and explanation on how to use Dyspnea scale       Expected Outcomes Short Term: Able to use Dyspnea scale daily in rehab to express subjective sense of shortness of breath during exertion;Long Term: Able to use Dyspnea scale to guide intensity level when exercising independently        Knowledge and understanding of Target Heart Rate Range (THRR) Yes       Intervention Provide education and explanation of THRR including how the numbers were predicted and where they are located for reference       Expected Outcomes Short Term: Able to state/look up THRR;Long Term: Able to use THRR to govern intensity when exercising independently;Short Term: Able to use daily as guideline for intensity in rehab       Able to check pulse independently Yes       Intervention Provide education and demonstration on how to check pulse in carotid and radial arteries.;Review the importance of being able to check your own pulse for safety during independent exercise  Expected Outcomes Short Term: Able to explain why pulse checking is important during independent exercise;Long Term: Able to check pulse independently and accurately       Understanding of Exercise Prescription Yes       Intervention Provide education, explanation, and written materials on patient's individual exercise prescription       Expected Outcomes Short Term: Able to explain program exercise prescription;Long Term: Able to explain home exercise prescription to exercise independently                Exercise Goals Re-Evaluation :  Exercise Goals Re-Evaluation     Row Name 08/14/20 0946 08/27/20 1132 09/04/20 1016 09/11/20 0916 09/23/20 1439     Exercise Goal Re-Evaluation   Exercise Goals Review Understanding of Exercise Prescription;Able to check pulse independently;Knowledge and understanding of Target Heart Rate Range (THRR);Able to understand and use Dyspnea scale;Able to understand and use rate of perceived exertion (RPE) scale Increase Physical Activity;Increase Strength and Stamina Increase Physical Activity;Increase Strength and Stamina;Understanding of Exercise Prescription Increase Physical Activity;Increase Strength and Stamina;Understanding of Exercise Prescription Increase Physical Activity;Increase Strength  and Stamina   Comments Reviewed RPE and dyspnea scales, THR and program prescription with pt today.  Pt voiced understanding and was given a copy of goals to take home. Susan Sosa is doing well in her first sessions.  Lowest oxygen is 93% during exercise.  Staff will monitor progress. Reviewed home exercise with pt today.  Pt plans to walk for exercise. She already walks the mall about 2-3x/ week. She is thinking of joining the [employer]. Reviewed THR, pulse, RPE, sign and symptoms, pulse oximetery and when to call 911 or MD.  Also discussed weather considerations and indoor options.  Pt voiced understanding. Susan Sosa is doing well in rehab. She has increased her treadmill to 1.7 speed and 2.5% for the incline and is already to level 3 on the Nustep. Will continue to monitor progression. Susan Sosa continues to do well. She attends consistently and sats stay in the 90s.  Staff will encourage her to try 4 lb weights.   Expected Outcomes Short: Use RPE daily to regulate intensity. Long: Follow program prescription in THR. Short:  attend consistently Long:  build Sosa stamina Short: Check HR and O2 during exercise Long: Continue to exercise independently at appropriate prescription Short: Continue to increase load on treadmill as tolerated Long: Continue to increase Sosa MET level Short: try 4 lb Long :  build Sosa stamina    Row Name 10/06/20 1045 10/14/20 1023 10/22/20 1449 11/03/20 1409 11/13/20 1002     Exercise Goal Re-Evaluation   Exercise Goals Review Increase Physical Activity;Increase Strength and Stamina Increase Physical Activity;Increase Strength and Stamina;Able to check pulse independently Increase Physical Activity;Increase Strength and Stamina Increase Physical Activity;Increase Strength and Stamina;Understanding of Exercise Prescription Increase Physical Activity;Increase Strength and Stamina;Understanding of Exercise Prescription   Comments Susan Sosa is doing well in rehab. She has increased her load on  the treadmill to 1.7 speed and 2.5 % incline.  RPEs have been appropriate through her exercise sessions. Her handweights should be encouraged to increase to 4 lbs. Susan Sosa is walking for about 40 minutes 3x/week on top of 2 days of rehab. Her and her husband walk around the mall. She is still checking her HR and O2 and states her O2 never drops below 88%. She staed that she feels she can walk longer than she used to prior to starting the program. Susan Sosa tried 4 lb weights today for strength work.  She  attends consistently and reaches THR range.  We will continue to monitor progress. Susan Sosa has been doing well in rehab.  She is has gotten up to 1.9 mph on the treadmill and then comes back down.  We will continue to monitor her progress. Susan Sosa continues to do well in rehab. She is walking at home and using her pedal machine at home on her off days.  She does feel like her stamina is getting better.  She asked if we were seeing any improvement so reviewed her progression.   Expected Outcomes Short: Continue to increase load on TM Long: Continue to increase Sosa MET level Short: Continue watching O2 and HR during home exercise Long: Continue to exercise independently at appropriate prescription Short:  maintain consistent attendance Long:  continue to build stamina Short: Improve time at 1.9 mph Long; continue to improve stamina Short: COnitnue to exercise on off days Long: Continue to improve stamina.    Susan Sosa Name 11/19/20 1338 12/01/20 0922 12/04/20 1009 12/29/20 1227       Exercise Goal Re-Evaluation   Exercise Goals Review Increase Physical Activity;Increase Strength and Stamina Increase Physical Activity;Increase Strength and Stamina Increase Physical Activity;Increase Strength and Stamina Increase Physical Activity;Increase Strength and Stamina;Understanding of Exercise Prescription    Comments Susan Sosa continues to do well. She varies her speed and grade on TM.  She reaches THR and oxygen sats stay in 90s on 3 L.  Susan Sosa continues to do well. RPEs are staying in appropriate ranges, she is not staying too consistent with the levels and speeds on the machines and should maintain consistency throughout. She is due for her post 6MWT soon and should do well. Will continue to monitor until graduation. Susan Sosa improved post 6MWT by 28%!  She is going to walk at the mall and [facility] she [facility] Susan Sosa is set to graduate this week and continue to walk at mall and join [place]; contineu to attend consistently Long: build Sosa stamina Short: Improve on 6MWT Long: Continue to increase Sosa MET level Short: complete LW Long: maintain exercise on her own Short: complete LW Long: maintain exercise on her own             Discharge Exercise Prescription (Final Exercise Prescription Changes):  Exercise Prescription Changes - 12/29/20 1200       Response to Exercise   Blood Pressure (Admit) 122/60    Blood Pressure (Exit) 108/60    Heart Rate (Admit) 93 bpm    Heart Rate (Exercise) 100 bpm    Heart Rate (Exit) 77 bpm    Oxygen Saturation (Admit) 98 %    Oxygen Saturation (Exercise) 93 %    Oxygen Saturation (Exit) 98 %    Rating of Perceived Exertion (Exercise) 12    Perceived Dyspnea (Exercise) 0    Symptoms none    Duration Continue with 30 min of aerobic exercise without signs/symptoms of physical distress.    Intensity THRR unchanged      Progression   Progression Continue to progress workloads to maintain intensity without signs/symptoms of physical distress.    Average METs 3      Resistance Training   Training Prescription Yes    Weight 4 lb    Reps 10-15      Interval Training   Interval Training No      Oxygen   Oxygen Continuous    Liters 4      Treadmill   MPH  1.7    Grade 3    Minutes 15    METs 3      NuStep   Level 6    Minutes 15      Home Exercise Plan   Plans to continue exercise at [facility] (comment)   walking at the mall; staff  videos   Frequency Add 2 additional days to program exercise sessions.    Initial Home Exercises Provided 09/04/20      Oxygen   Maintain Oxygen Saturation 88% or higher             Nutrition:  Target Goals: Understanding of nutrition guidelines, daily intake of sodium <1587m, cholesterol <2017m calories 30% from fat and 7% or less from saturated fats, daily to have 5 or more servings of fruits and vegetables.  Education: All About Nutrition: -Group instruction provided by verbal, written material, interactive activities, discussions, models, and posters to present general guidelines for heart healthy nutrition including fat, fiber, MyPlate, the role of sodium in heart healthy nutrition, utilization of the nutrition label, and utilization of this knowledge for meal planning. Follow up email sent as well. Written material given at graduation. Flowsheet [facility]  Date 10/16/20  [facility]ruction Review Code 1- Verbalizes Understanding       Biometrics:  Pre Biometrics - 08/12/20 1157       Pre Biometrics   Height 5' (1.524 m)    Weight 145 lb 11.2 oz (66.1 kg)    BMI (Calculated) 28.46    Single Leg Stand 10.03 seconds             Post Biometrics - 12/04/20 1007        Post  Biometrics   Height 5' (1.524 m)    Weight 143 lb 12.8 oz (65.2 kg)    BMI (Calculated) 28.08    Single Leg Stand 10.34 seconds             Nutrition Therapy Plan and Nutrition Goals:  Nutrition Therapy & Goals - 08/26/20 0920       Nutrition Therapy   Diet Heart healthy, low Na, pulmonary MNT    Drug/Food Interactions Statins/Certain Fruits    Protein (specify units) 80g    Fiber 25 grams    Whole Grain Foods 3 servings    Saturated Fats 12 max. grams    Fruits and Vegetables 8 servings/day    Sodium 1.5 grams      Personal Nutrition Goals   Nutrition Goal ST: try soymilk, add avocado or peanut butter to  lunch, swap out protein shake (Ensure, Boost, Orgain) LT: maintain weight    Comments B: cereal (frosted flakes and mini wheats and almond milk) or sausage biscuit S: premier protein L: not hungry - she thinks this is due to her medication. 1-2 eggs and bread D: meat (chicken, steak, limits her pork intake (she feels she is allergic) - grilled)and some vegetables (peas, pinto beans, beets, corn, small amounts of potato - if she does it is a sweet potato, spinach, turnip greens - little grapeseed oil) S: apple. Drinks: water.  She will cook beans from dried. Does not cook with salt. She has a lower appetite - has been for years. She has lost 30 lbs over 3-4 months (2 years ago) - gained 10 pounds back. She drinks premier protein which has recently been recalled - provided information on lot numbers and encourgaed her to  check her products - suggested swapping and trying Ensure, Orgain, or Boost. suggested swapping almond milk for fortified soymilk and adding avocados or peanut butter to her lunch. Discussed heart healthy eating and pulmonary friendly eating.      Intervention Plan   Intervention Prescribe, educate and counsel regarding individualized specific dietary modifications aiming towards targeted core components such as weight, hypertension, lipid management, diabetes, heart failure and other comorbidities.;Nutrition handout(s) given to patient.    Expected Outcomes Short Term Goal: Understand basic principles of dietary content, such as calories, fat, sodium, cholesterol and nutrients.;Short Term Goal: A plan has been developed with personal nutrition goals set during dietitian appointment.;Long Term Goal: Adherence to prescribed nutrition plan.             Nutrition Assessments:  MEDIFICTS Score Key: ?70 Need to make dietary changes  40-70 Heart Healthy Diet ? 40 Therapeutic Level Cholesterol Diet  Flowsheet Row Pulmonary Rehab from 12/23/2020 in [facility] Cardiac and Pulmonary Rehab   Picture Your Plate Total Score on Admission 66  Picture Your Plate Total Score on Discharge 76      Picture Your Plate Scores: <95 Unhealthy dietary pattern with much room for improvement. 41-50 Dietary pattern unlikely to meet recommendations for good health and room for improvement. 51-60 More healthful dietary pattern, with some room for improvement.  >60 Healthy dietary pattern, although there may be some specific behaviors that could be improved.   Nutrition Goals Re-Evaluation:  Nutrition Goals Re-Evaluation     [place] Name 09/16/20 1028 10/14/20 1025 11/13/20 1005 12/04/20 1010       Goals   Nutrition Goal ST: try soymilk, add avocado or peanut butter to lunch, swap out protein shake (Ensure, Boost, Orgain) LT: maintain weight ST: try soymilk, add avocado or peanut butter to lunch, swap out protein shake (Ensure, Boost, Orgain) LT: maintain weight Short: Continue to follow RD recommendations Long: Continue eating a pulmonary-based diet --    Comment Susan Sosa is doing well with her changes. Yocelyn has been drinking Atkins shake once per day to help increase her protein intake. She has been adding peanut butter to her crackers as well as another change. Susan Sosa is doing well. She is drinking boost almost daily for extra protein in her diet. She is also adding  peanut butter on celery or crackers. She finds them filling. She weighs herself at home and her weight has been maintained well. Susan Sosa is doing well with her diet.  She is still trying to get more protein and is now using both Boost and Ensure to help.  Otherwise trying to eat balanced and get her fruits and vegetables. Susan Sosa contiues to follow recommendations from RD    Expected Outcome Short: Continue to follow RD goals Long: Continue to eat a balanced- pulmonary based diet Short: Continue to follow RD recommendations Long: Continue eating a pulmonary-based diet Short: Continue to boost protein Long: Continued balance diet. ST/LT: maintain  pulmonary based diet             Nutrition Goals Discharge (Final Nutrition Goals Re-Evaluation):  Nutrition Goals Re-Evaluation - 12/04/20 1010       Goals   Comment Susan Sosa contiues to follow recommendations from RD    Expected Outcome ST/LT: maintain pulmonary based diet             Psychosocial: Target Goals: Acknowledge presence or absence of significant depression and/or stress, maximize coping skills, provide positive support system. Participant is able to verbalize types and ability to  use techniques and skills needed for reducing stress and depression.   Education: Stress, Anxiety, and Depression - Group verbal and visual presentation to define topics covered.  Reviews how body is impacted by stress, anxiety, and depression.  Also discusses healthy ways to reduce stress and to treat/manage anxiety and depression.  Written material given at graduation. Flowsheet Row Pulmonary Rehab from 11/13/2020 [facility]  Date 09/04/20  Educator [facility]  Instruction Review Code 1- [employer] Understanding       Education: Sleep Hygiene -Provides group verbal and written instruction about how sleep can affect your health.  Define sleep hygiene, discuss sleep cycles and impact of sleep habits. Review good sleep hygiene tips.    Initial Review & Psychosocial Screening:  Initial Psych Review & Screening - 08/04/20 1545       Initial Review   Current issues with Current Sleep Concerns      Family Dynamics   Good Support System? Yes   husband     Barriers   Psychosocial barriers to participate in program There are no identifiable barriers or psychosocial needs.;The patient should benefit from training in stress management and relaxation.      Screening Interventions   Interventions Encouraged to exercise;Provide feedback about the scores to participant;To provide support and resources with identified psychosocial needs    Expected Outcomes Short Term goal:  Utilizing psychosocial counselor, staff and physician to assist with identification of specific Stressors or current issues interfering with healing process. Setting desired goal for each stressor or current issue identified.;Long Term Goal: Stressors or current issues are controlled or eliminated.;Short Term goal: Identification and review with participant of any Quality of Life or Depression concerns found by scoring the questionnaire.;Long Term goal: The participant improves quality of Life and PHQ9 Scores as seen by post scores and/or verbalization of changes             Quality of Life Scores:  Scores of 19 and below usually indicate a poorer quality of life in these areas.  A difference of  2-3 points is a clinically meaningful difference.  A difference of 2-3 points in the total score of the Quality of Life Index has been associated with significant improvement in Sosa quality of life, self-image, physical symptoms, and general health in studies assessing change in quality of life.  PHQ-9: Recent Review Flowsheet Data     Depression screen [facility] 2/9 12/23/2020 08/12/2020   Decreased Interest 0 0   Down, Depressed, Hopeless 0 0   PHQ - 2 Score 0 0   Altered sleeping 1 0   Tired, decreased energy 1 1   Change in appetite 2 0   Feeling bad or failure about yourself  1 0   Trouble concentrating 0 0   Moving slowly or fidgety/restless 0 0   Suicidal thoughts 0 0   PHQ-9 Score 5 1   Difficult doing work/chores Not difficult at all Not difficult at all      Interpretation of Total Score  Total Score Depression Severity:  1-4 = Minimal depression, 5-9 = Mild depression, 10-14 = Moderate depression, 15-19 = Moderately severe depression, 20-27 = Severe depression   Psychosocial Evaluation and Intervention:  Psychosocial Evaluation - 08/04/20 1546       Psychosocial Evaluation & Interventions   Interventions Encouraged to exercise with the program and follow exercise prescription     Comments 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 She has been dealing  with this diagnosis for the past two years, but is proud that the doctor said she seems to be holding her own. She reports that she already has lost 30% of her lung capacity and is working hard to maintain her current lung function which is why she is motivated to come to rehab. She walks 2-3 times a week at [facility] and is diligent about her breathing exercises and medications. She wears oxygen as well. Her husband is her main support system. She does not report any stressors currently as she feels like she is handling her current health status appropriately. She has noticed in the last 6 months that her sleep has been altered, where she used to fall asleep quickly now it is taking her over an hour to fall back asleep after waking to use the bathroom.    Expected Outcomes Short: attend pulmonary rehab for education and exercise. Long: develop and maintain positive self care habits.    Continue Psychosocial Services  Follow up required by staff             Psychosocial Re-Evaluation:  Psychosocial Re-Evaluation     [place] Name 09/16/20 1027 10/14/20 1029 11/13/20 1004 12/04/20 1006       Psychosocial Re-Evaluation   Current issues with Current Sleep Concerns Current Sleep Concerns Current Sleep Concerns --    Comments Susan Sosa is holding up well mentally. Her sleep isnt the best when she gets up to use the restroom throughout the night. She does not have any desire to ask her doctor for any sleep aide. She does have good support from family and husband specifically Susan Sosa is still doing well. She still gets up 2-3x/night, she does not want to take any other medications. She is currently not taking any medications for sleep or depression and states she has no big stressors at this time. Her and her husband walk a lot at the mall to keep them busy. Aylssa is doing well in rehab.  She denies any major  stressors currently.  Her sleep issues is getting better and she thinks it's probabaly coming form the exercise. Rochele reports she still sleeps good.  She denies any anxiety or depression.    Expected Outcomes Short: Continue attendance with rehab Long: Maintain positive attitude Short: Continue walking at the mall for positive attitude Long: Continue to utilize exercise for stress management Short: Continue to exercise for mental boost and sleep quality Long: Continue to stay positive. Short: continue to exercise to help sleep Long: maintain positive outlook    Interventions Encouraged to attend Cardiac Rehabilitation for the exercise Encouraged to attend Pulmonary Rehabilitation for the exercise Encouraged to attend Pulmonary Rehabilitation for the exercise --    Continue Psychosocial Services  Follow up required by staff Follow up required by staff -- --             Psychosocial Discharge (Final Psychosocial Re-Evaluation):  Psychosocial Re-Evaluation - 12/04/20 1006       Psychosocial Re-Evaluation   Comments Susan Sosa reports she still sleeps good.  She denies any anxiety or depression.    Expected Outcomes Short: continue to exercise to help sleep Long: maintain positive outlook             Education: Education Goals: Education classes will be provided on a weekly basis, covering required topics. Participant will state understanding/return demonstration of topics presented.  Learning Barriers/Preferences:   General Pulmonary Education Topics:  Infection Prevention: - Provides verbal and written material to individual  with discussion of infection control including proper hand washing and proper equipment cleaning during exercise session. Flowsheet Row Pulmonary Rehab from 11/13/2020 in [facility] Cardiac and Pulmonary Rehab  Education need identified 08/12/20  Date 08/12/20  Educator [place]  Instruction Review Code 1- Verbalizes Understanding       Falls Prevention: - Provides  verbal and written material to individual with discussion of falls prevention and safety. Flowsheet Row Pulmonary Rehab from 11/13/2020 in [facility] Cardiac and Pulmonary Rehab  Education need identified 08/12/20  Date 08/12/20  Educator [place]  Instruction Review Code 1- Verbalizes Understanding       Chronic Lung Disease Review: - Group verbal instruction with posters, models, PowerPoint presentations and videos,  to review new updates, new respiratory medications, new advancements in procedures and treatments. Providing information on websites and "800" numbers for continued self-education. Includes information about supplement oxygen, available portable oxygen systems, continuous and intermittent flow rates, oxygen safety, concentrators, and Medicare reimbursement for oxygen. Explanation of Pulmonary Drugs, including class, frequency, complications, importance of spacers, rinsing mouth after steroid MDI's, and proper cleaning methods for nebulizers. Review of basic lung anatomy and physiology related to function, structure, and complications of lung disease. Review of risk factors. Discussion about methods for diagnosing sleep apnea and types of masks and machines for OSA. Includes a review of the use of types of environmental controls: home humidity, furnaces, filters, dust mite/pet prevention, HEPA vacuums. Discussion about weather changes, air quality and the benefits of nasal washing. Instruction on Warning signs, infection symptoms, calling MD promptly, preventive modes, and value of vaccinations. Review of effective airway clearance, coughing and/or vibration techniques. Emphasizing that all should Create an Action Plan. Written material given at graduation. Flowsheet Row Pulmonary Rehab from 11/13/2020 in [facility] Cardiac and Pulmonary Rehab  Date 08/28/20  Educator [facility]  Instruction Review Code 1- Verbalizes Understanding       AED/CPR: - Group verbal and written instruction with the use of models  to demonstrate the basic use of the AED with the basic ABC's of resuscitation.    Anatomy and Cardiac Procedures: - Group verbal and visual presentation and models provide information about basic cardiac anatomy and function. Reviews the testing methods done to diagnose heart disease and the outcomes of the test results. Describes the treatment choices: Medical Management, Angioplasty, or Coronary Bypass Surgery for treating various heart conditions including Myocardial Infarction, Angina, Valve Disease, and Cardiac Arrhythmias.  Written material given at graduation. Flowsheet Row Pulmonary Rehab from 11/13/2020 in [facility] Cardiac and Pulmonary Rehab  Date 09/25/20  Educator KB  Instruction Review Code 1- Verbalizes Understanding       Medication Safety: - Group verbal and visual instruction to review commonly prescribed medications for heart and lung disease. Reviews the medication, class of the drug, and side effects. Includes the steps to properly store meds and maintain the prescription regimen.  Written material given at graduation. Flowsheet Row Pulmonary Rehab from 11/13/2020 in [facility] Cardiac and Pulmonary Rehab  Date 08/14/20  Educator SB  Instruction Review Code 1- Verbalizes Understanding       Other: -Provides group and verbal instruction on various topics (see comments)   Knowledge Questionnaire Score:  Knowledge Questionnaire Score - 12/23/20 1011       Knowledge Questionnaire Score   Pre Score 15/18: Oxygen saturation, Albuterol, Oxygen flame    Post Score 15/18              Core Components/Risk Factors/Patient Goals at Admission:  Personal Goals and  Risk Factors at Admission - 08/12/20 1508       Core Components/Risk Factors/Patient Goals on Admission    Weight Management Yes;Weight Maintenance    Intervention Weight Management: Develop a combined nutrition and exercise program designed to reach desired caloric intake, while maintaining appropriate intake of  nutrient and fiber, sodium and fats, and appropriate energy expenditure required for the weight goal.;Weight Management: Provide education and appropriate resources to help participant work on and attain dietary goals.;Weight Management/Obesity: Establish reasonable short term and long term weight goals.    Admit Weight 145 lb (65.8 kg)    Goal Weight: Short Term 145 lb (65.8 kg)    Goal Weight: Long Term 145 lb (65.8 kg)    Expected Outcomes Short Term: Continue to assess and modify interventions until short term weight is achieved;Weight Maintenance: Understanding of the daily nutrition guidelines, which includes 25-35% calories from fat, 7% or less cal from saturated fats, less than 244m cholesterol, less than 1.5gm of sodium, & 5 or more servings of fruits and vegetables daily;Understanding recommendations for meals to include 15-35% energy as protein, 25-35% energy from fat, 35-60% energy from carbohydrates, less than 2063mof dietary cholesterol, 20-35 gm of total fiber daily;Understanding of distribution of calorie intake throughout the day with the consumption of 4-5 meals/snacks    Improve shortness of breath with ADL's Yes    Intervention Provide education, individualized exercise plan and daily activity instruction to help decrease symptoms of SOB with activities of daily living.    Expected Outcomes Short Term: Improve cardiorespiratory fitness to achieve a reduction of symptoms when performing ADLs;Long Term: Be able to perform more ADLs without symptoms or delay the onset of symptoms    Hypertension Yes    Intervention Provide education on lifestyle modifcations including regular physical activity/exercise, weight management, moderate sodium restriction and increased consumption of fresh fruit, vegetables, and low fat dairy, alcohol moderation, and smoking cessation.;Monitor prescription use compliance.    Expected Outcomes Short Term: Continued assessment and intervention until BP is <  140/9039mG in hypertensive participants. < 130/51m73m in hypertensive participants with diabetes, heart failure or chronic kidney disease.;Long Term: Maintenance of blood pressure at goal levels.    Lipids Yes    Intervention Provide education and support for participant on nutrition & aerobic/resistive exercise along with prescribed medications to achieve LDL <70mg27mL >40mg.72mExpected Outcomes Short Term: Participant states understanding of desired cholesterol values and is compliant with medications prescribed. Participant is following exercise prescription and nutrition guidelines.;Long Term: Cholesterol controlled with medications as prescribed, with individualized exercise RX and with personalized nutrition plan. Value goals: LDL < 70mg, 1m> 40 mg.             Education:Diabetes - Individual verbal and written instruction to review signs/symptoms of diabetes, desired ranges of glucose level fasting, after meals and with exercise. Acknowledge that pre and post exercise glucose checks will be done for 3 sessions at entry of program.   Know Your Numbers and Heart Failure: - Group verbal and visual instruction to discuss disease risk factors for cardiac and pulmonary disease and treatment options.  Reviews associated critical values for Overweight/Obesity, Hypertension, Cholesterol, and Diabetes.  Discusses basics of heart failure: signs/symptoms and treatments.  Introduces Heart Failure Zone chart for action plan for heart failure.  Written material given at graduation. Flowsheet Row Pulmonary [facility]  Date 08/21/20  Educator SB  Instruction Review Code 1-  Verbalizes Understanding       Core Components/Risk Factors/Patient Goals Review:   Goals and Risk Factor Review     Row Name 09/16/20 1024 10/14/20 1026 11/13/20 1006 12/04/20 1004       Core Components/Risk Factors/Patient Goals Review   Personal Goals Review Weight  Management/Obesity;Hypertension;Improve shortness of breath with ADL's Weight Management/Obesity;Hypertension;Improve shortness of breath with ADL's Weight Management/Obesity;Hypertension;Improve shortness of breath with ADL's;Increase knowledge of respiratory medications and ability to use respiratory devices properly. Develop more efficient breathing techniques such as purse lipped breathing and diaphragmatic breathing and practicing self-pacing with activity.;Increase knowledge of respiratory medications and ability to use respiratory devices properly.    Review Susan Sosa thinks she lost weight a little bit and will continue to check it at home and make note of any abnormal changes. Checks BP at home typically runs around 130s/70s and fairly stable at rehab. She is taking all medications as prescribed. Breathing still feels the same so far but she recently was diagnosed with COVID. She is hoping she will feel better the more she recovers. Susan Sosa still checks her BP at home and is having problems with her BP increasing at night to around [id number] systolic.  She talked to her doctor and they increased her medication several weeks ago but unfortunately she hasn't seen a change yet. She states her BP during the day is stable..She goes back to see her doctor in a couple of weeks to follow up. She plans to call them before that. In regards to her breathing, At home, she feels she can walk more at home than she used to before starting the program. She is pleased thus far. Susan Sosa is doing well in rehab.  Her weight is holding steady.  Her pressures have gotten better and are not going up at night still.  She is doing well with her meds now.  Her pulmonary meds are doing well too.  She notes that her shortness of breath has started to improve as she is able to do more. Susan Sosa walks at [employer] on days not at [employer].  She has noticed she feels a little short of breath at the beginning , then it improves as she continues.  She uses PLB  as needed.  She reports taking meds as directed.  BP has been good, not going up at night    Expected Outcomes Short: Continue to watch weight at home Long: Continue to manage lifestyle risk factors Short: Check with doctor on BP and keep monitoring Long: Maintain stable blood pressure throughout the day Short: Continue to keep close eye on pressures Long: Continue to monitor risk factors. Short: contineu to use PLB and monitor pressure             Core Components/Risk Factors/Patient Goals at Discharge (Final Review):   Goals and Risk Factor Review - 12/04/20 1004       Core Components/Risk Factors/Patient Goals Review   Personal Goals Review Develop more efficient breathing techniques such as purse lipped breathing and diaphragmatic breathing and practicing self-pacing with activity.;Increase knowledge of respiratory medications and ability to use respiratory devices properly.    Review Tierria walks at [employer] on days not at [employer].  She has noticed she feels a little short of breath at the beginning , then it improves as she continues.  She uses PLB as needed.  She reports taking meds as directed.  BP has been good, not going up at night    Expected Outcomes Short: contineu  to use PLB and monitor pressure             ITP Comments:  ITP Comments     Row Name 08/04/20 1557 08/12/20 1141 08/14/20 0946 08/26/20 1014 09/03/20 0738   ITP Comments Initial telephone orientation completed. [facility]angosis can be found in [facility] 6/24. EP orientation scheduled for Tuesday 7/19 at 10am. Completed 6MWT and gym orientation. Initial ITP created and sent for review to Dr. Ottie Glazier, Medical Director. First full day of exercise!  Patient was oriented to gym and equipment including functions, settings, policies, and procedures.  Patient's individual exercise prescription and treatment plan were reviewed.  All starting workloads were established based on the results of the 6 minute walk test done at initial  orientation visit.  The plan for exercise progression was also introduced and progression will be customized based on patient's performance and goals. Completed initial RD evaluation 30 Day review completed. Medical Director ITP review done, changes made as directed, and signed approval by Medical Director.    [place] Name 09/11/20 0915 10/01/20 6948 10/29/20 0931 11/26/20 0721 12/24/20 0708   ITP Comments Gelene tested positive for COVID on 8/12 and will be out until cleared to come back 30 Day review completed. Medical Director ITP review done, changes made as directed, and signed approval by Medical Director. 30 day review completed. ITP sent to Dr. Zetta Bills, Medical Director of Pulmonary Rehab. Continue with ITP unless changes are made by physician. 30 Day review completed. Medical Director ITP review done, changes made as directed, and signed approval by Medical Director. 30 Day review completed. Medical Director ITP review done, changes made as directed, and signed approval by Medical Director.    Row Name 12/30/20 1232           ITP Comments Sakura graduated today from  rehab with 36 sessions completed.  Details of the patient's exercise prescription and what She needs to do in order to continue the prescription and progress were discussed with patient.  Patient was given a copy of prescription and goals.  Patient verbalized understanding.  Dreonna plans to continue to exercise by walking.                Comments: Discharge ITP

## 2020-12-30 NOTE — Progress Notes (Signed)
Discharge Progress Report  Patient Details  Name: Susan Sosa MRN: [id number] Date of Birth: [birth year] Referring Provider:   Flowsheet Row Pulmonary Rehab from 08/12/2020 in [facility] Cardiac and Pulmonary Rehab  Referring Provider Dr. Ottie Glazier MD        Number of Visits: 36  Reason for Discharge:  Patient reached a stable level of exercise. Patient independent in their exercise. Patient has met program and personal goals.  Smoking History:  Social History   Tobacco Use  Smoking Status Former  Smokeless Tobacco Never    Diagnosis:  Pulmonary fibrosis ([place])  ADL UCSD:  Pulmonary Assessment Scores     Row Name 08/12/20 1440 12/04/20 1007 12/23/20 1008     ADL UCSD   ADL Phase Entry Exit Exit   SOB Score total 23 -- 56   Rest 0 -- 1   Walk 1 -- 2   Stairs 3 -- 3   Bath 1 -- 3   Dress 1 -- 2   Shop 1 -- 3     CAT Score   CAT Score 11 -- 11     mMRC Score   mMRC Score 1 1 --            Initial Exercise Prescription:  Initial Exercise Prescription - 08/12/20 1500       Date of Initial Exercise RX and Referring Provider   Date 08/12/20    Referring Provider Dr. Lanney Gins, Luvenia Heller MD      Treadmill   MPH 1.5    Grade 0    Minutes 15    METs 2.15      Recumbant Bike   Level 1    RPM 60    Watts 10    Minutes 15    METs 1.08      NuStep   Level 1    SPM 80    Minutes 15    METs 1.08      T5 Nustep   Level 1    SPM 80    Minutes 15    METs 1.08      Prescription Details   Frequency (times per week) 2    Duration Progress to 30 minutes of continuous aerobic without signs/symptoms of physical distress      Intensity   THRR 40-80% of Max Heartrate 95-124    Ratings of Perceived Exertion 11-13    Perceived Dyspnea 0-4      Progression   Progression Continue to progress workloads to maintain intensity without signs/symptoms of physical distress.      Resistance Training   Training Prescription Yes    Weight 3 lb    Reps 10-15              Discharge Exercise Prescription (Final Exercise Prescription Changes):  Exercise Prescription Changes - 12/29/20 1200       Response to Exercise   Blood Pressure (Admit) 122/60    Blood Pressure (Exit) 108/60    Heart Rate (Admit) 93 bpm    Heart Rate (Exercise) 100 bpm    Heart Rate (Exit) 77 bpm    Oxygen Saturation (Admit) 98 %    Oxygen Saturation (Exercise) 93 %    Oxygen Saturation (Exit) 98 %    Rating of Perceived Exertion (Exercise) 12    Perceived Dyspnea (Exercise) 0    Symptoms none    Duration Continue with 30 min of aerobic exercise without signs/symptoms of physical distress.    Intensity THRR unchanged  Progression   Progression Continue to progress workloads to maintain intensity without signs/symptoms of physical distress.    Average METs 3      Resistance Training   Training Prescription Yes    Weight 4 lb    Reps 10-15      Interval Training   Interval Training No      Oxygen   Oxygen Continuous    Liters 4      Treadmill   MPH 1.7    Grade 3    Minutes 15    METs 3      NuStep   Level 6    Minutes 15      Home Exercise Plan   Plans to continue exercise at [employer] (comment)   walking at the mall; staff videos   Frequency Add 2 additional days to program exercise sessions.    Initial Home Exercises Provided 09/04/20      Oxygen   Maintain Oxygen Saturation 88% or higher             Functional Capacity:  6 Minute Walk     Row Name 08/12/20 1158 12/04/20 1003       6 Minute Walk   Phase Initial Discharge    Distance 500 feet 785 feet    Distance % Change -- 57 %    Distance Feet Change -- 285 ft    Walk Time 3.5 minutes  Stopped at 3:30 due to O2 sats 5.25 minutes    # of Rest Breaks 0 0    MPH 1.62 1.7    METS 1.08 1.41    RPE 12 13    Perceived Dyspnea  2 1    VO2 Peak 3.78 4.94    Symptoms No Yes (comment)    Comments -- SOB    Resting HR 67 bpm 73 bpm    Resting BP 130/62 134/62     Resting Oxygen Saturation  97 % 92 %    Exercise Oxygen Saturation  during 6 min walk 79 % 75 %    Max Ex. HR 111 bpm 113 bpm    Max Ex. BP 162/68 144/80    2 Minute Post BP 134/68 124/72      Interval HR   1 Minute HR 87 103    2 Minute HR 101 113    3 Minute HR 111  Stopped at 3:30 due to O2 < 80% 94    4 Minute HR -- 101    5 Minute HR -- 108    6 Minute HR -- 110    2 Minute Post HR -- 76    Interval Heart Rate? Yes Yes      Interval Oxygen   Interval Oxygen? Yes Yes    Baseline Oxygen Saturation % 97 % 92 %    1 Minute Oxygen Saturation % 93 % 91 %    1 Minute Liters of Oxygen 2 L 3 L  pulsed    2 Minute Oxygen Saturation % 87 % 78 %    2 Minute Liters of Oxygen 2 L 3 L    3 Minute Oxygen Saturation % 79 % 75 %    3 Minute Liters of Oxygen 2 L 3 L    4 Minute Oxygen Saturation % --  stopped 96 %    4 Minute Liters of Oxygen -- 3 L    5 Minute Oxygen Saturation % --  stopped 84 %    5  Minute Liters of Oxygen -- 3 L    6 Minute Oxygen Saturation % --  stopped 78 %    6 Minute Liters of Oxygen -- 3 L    2 Minute Post Oxygen Saturation % 97 % 97 %    2 Minute Post Liters of Oxygen 3 L 3 L             Psychological, QOL, Others - Outcomes: PHQ 2/9: Depression screen [facility] 2/9 12/23/2020 08/12/2020  Decreased Interest 0 0  Down, Depressed, Hopeless 0 0  PHQ - 2 Score 0 0  Altered sleeping 1 0  Tired, decreased energy 1 1  Change in appetite 2 0  Feeling bad or failure about yourself  1 0  Trouble concentrating 0 0  Moving slowly or fidgety/restless 0 0  Suicidal thoughts 0 0  PHQ-9 Score 5 1  Difficult doing work/chores Not difficult at all Not difficult at all    Nutrition & Weight - Outcomes:  Pre Biometrics - 08/12/20 1157       Pre Biometrics   Height 5' (1.524 m)    Weight 145 lb 11.2 oz (66.1 kg)    BMI (Calculated) 28.46    Single Leg Stand 10.03 seconds             Post Biometrics - 12/04/20 1007        Post  Biometrics   Height 5'  (1.524 m)    Weight 143 lb 12.8 oz (65.2 kg)    BMI (Calculated) 28.08    Single Leg Stand 10.34 seconds             Nutrition:  Nutrition Therapy & Goals - 08/26/20 0920       Nutrition Therapy   Diet Heart healthy, low Na, pulmonary MNT    Drug/Food Interactions Statins/Certain Fruits    Protein (specify units) 80g    Fiber 25 grams    Whole Grain Foods 3 servings    Saturated Fats 12 max. grams    Fruits and Vegetables 8 servings/day    Sodium 1.5 grams      Personal Nutrition Goals   Nutrition Goal ST: try soymilk, add avocado or peanut butter to lunch, swap out protein shake (Ensure, Boost, Orgain) LT: maintain weight    Comments B: cereal (frosted flakes and mini wheats and almond milk) or sausage biscuit S: premier protein L: not hungry - she thinks this is due to her medication. 1-2 eggs and bread D: meat (chicken, steak, limits her pork intake (she feels she is allergic) - grilled)and some vegetables (peas, pinto beans, beets, corn, small amounts of potato - if she does it is a sweet potato, spinach, turnip greens - little grapeseed oil) S: apple. Drinks: water.  She will cook beans from dried. Does not cook with salt. She has a lower appetite - has been for years. She has lost 30 lbs over 3-4 months (2 years ago) - gained 10 pounds back. She drinks premier protein which has recently been recalled - provided information on lot numbers and encourgaed her to check her products - suggested swapping and trying Ensure, Orgain, or Boost. suggested swapping almond milk for fortified soymilk and adding avocados or peanut butter to her lunch. Discussed heart healthy eating and pulmonary friendly eating.      Intervention Plan   Intervention Prescribe, educate and counsel regarding individualized specific dietary modifications aiming towards targeted core components such as weight, hypertension, lipid management, diabetes, heart failure and  other comorbidities.;Nutrition handout(s) given  to patient.    Expected Outcomes Short Term Goal: Understand basic principles of dietary content, such as calories, fat, sodium, cholesterol and nutrients.;Short Term Goal: A plan has been developed with personal nutrition goals set during dietitian appointment.;Long Term Goal: Adherence to prescribed nutrition plan.            Goals reviewed with patient; copy given to patient.

## 2021-04-22 ENCOUNTER — Emergency Department: Payer: Medicare Other

## 2021-04-22 ENCOUNTER — Emergency Department
Admission: EM | Admit: 2021-04-22 | Discharge: 2021-04-22 | Disposition: A | Discharge status: Home / Self Care | Payer: Medicare Other | Attending: Emergency Medicine | Admitting: Emergency Medicine

## 2021-04-22 ENCOUNTER — Encounter: Payer: Self-pay | Admitting: Intensive Care

## 2021-04-22 ENCOUNTER — Other Ambulatory Visit: Payer: Self-pay

## 2021-04-22 DIAGNOSIS — K5900 Constipation, unspecified: Secondary | ICD-10-CM | POA: Diagnosis present

## 2021-04-22 DIAGNOSIS — K649 Unspecified hemorrhoids: Secondary | ICD-10-CM | POA: Insufficient documentation

## 2021-04-22 HISTORY — DX: Idiopathic pulmonary fibrosis: J84.112

## 2021-04-22 MED ORDER — HYDROCORTISONE ACETATE 25 MG RE SUPP: 25.0000 mg | Freq: Two times a day (BID) | RECTAL | 1 refills | Status: AC

## 2021-04-22 MED ORDER — POLYETHYLENE GLYCOL 3350 17 GM/SCOOP PO POWD
ORAL | 0 refills | Status: DC
Start: 2021-04-22 — End: 2023-11-10

## 2021-04-22 MED ORDER — MINERAL OIL RE ENEM
1.0000 | ENEMA | Freq: Once | RECTAL | Status: AC
  Administered 2021-04-22: 1 via RECTAL

## 2021-04-22 MED ORDER — SORBITOL 70 % SOLN
960.0000 mL | TOPICAL_OIL | Freq: Once | ORAL | Status: DC
  Filled 2021-04-22: qty 473

## 2021-04-22 NOTE — Discharge Instructions (Signed)
Follow up with your regular doctor as needed ?Return if worsening ?Use miralax as prescribed ?Anusol for hemorrhoids ?Follow up with dr Christian Mate for evaluation of hemorrhoid ?

## 2021-04-22 NOTE — ED Notes (Signed)
Per susan, pa, pt. Had VERY large BM, enema result. ?

## 2021-04-22 NOTE — ED Provider Notes (Addendum)
? ?[facility] ?Provider Note ? ? ? Event Date/Time  ? First MD Initiated Contact with Patient 04/22/21 1212   ?  (approximate) ? ? ?History  ? ?Constipation ? ? ?HPI ? ?Susan Sosa is a 81 y.o. female presents emergency department complaining of constipation.  Patient states she has been constipated for 5 days.  States she has to take laxatives to have bowel movements.  Last normal bowel movement was 5 days ago.  States that now the loose stool is leaking out around the hard stool.  Very painful to try and push the stool out as she has hemorrhoids. ? ?  ? ? ?Physical Exam  ? ?Triage Vital Signs: ?ED Triage Vitals  ?Enc Vitals Group  ?   BP 04/22/21 1147 (!) 188/78  ?   Pulse Rate 04/22/21 1147 89  ?   Resp 04/22/21 1147 (!) 22  ?   Temp 04/22/21 1147 98.4 ?F (36.9 ?C)  ?   Temp Source 04/22/21 1147 Oral  ?   SpO2 04/22/21 1147 95 %  ?   Weight 04/22/21 1148 142 lb (64.4 kg)  ?   Height 04/22/21 1148 4' 11.5" (1.511 m)  ?   Head Circumference --   ?   Peak Flow --   ?   Pain Score 04/22/21 1148 10  ?   Pain Loc --   ?   Pain Edu? --   ?   Excl. in GC? --   ? ? ?Most recent vital signs: ?Vitals:  ? 04/22/21 1147  ?BP: (!) 188/78  ?Pulse: 89  ?Resp: (!) 22  ?Temp: 98.4 ?F (36.9 ?C)  ?SpO2: 95%  ? ? ? ?General: Awake, no distress.   ?CV:  Good peripheral perfusion. regular rate and  rhythm ?Resp:  Normal effort.  ?Abd:  No distention.  Bowel sounds present all 4 quads, large hemorrhoid noted externally ?Other:    ? ? ?ED Results / Procedures / Treatments  ? ?Labs ?(all labs ordered are listed, but only abnormal results are displayed) ?Labs Reviewed - No data to display ? ? ?EKG ? ? ? ? ?RADIOLOGY ?Abdomen 1 view ? ? ? ?PROCEDURES: ? ? ?Fecal disimpaction ? ?Date/Time: 04/22/2021 2:29 PM ?Performed by: Faythe Ghee, PA-C ?Authorized by: Faythe Ghee, PA-C  ?Consent: Verbal consent obtained. ?Risks and benefits: risks, benefits and alternatives were discussed ?Consent given by:  patient ?Patient understanding: patient states understanding of the procedure being performed ?Imaging studies: imaging studies available ?Patient identity confirmed: verbally with patient ?Local anesthesia used: no ? ?Anesthesia: ?Local anesthesia used: no ? ?Sedation: ?Patient sedated: no ? ?Patient tolerance: patient tolerated the procedure well with no immediate complications ?Comments: Large amount of stool removed from rectum, pt tolerated procedure well ? ? ? ? ?MEDICATIONS ORDERED IN ED: ?Medications  ?sorbitol, milk of mag, mineral oil, glycerin (SMOG) enema (has no administration in time range)  ?mineral oil enema 1 enema (1 enema Rectal Given 04/22/21 1355)  ?mineral oil enema 1 enema (1 enema Rectal Given 04/22/21 1523)  ? ? ? ?IMPRESSION / MDM / ASSESSMENT AND PLAN / ED COURSE  ?I reviewed the triage vital signs and the nursing notes. ?             ?               ? ?Differential diagnosis includes, but is not limited to, constipation, bowel obstruction, mass ? ?X-ray of the abdomen was independently reviewed by me.  Large stool ball noted.  Confirmed by radiology ? ?Enema ordered.  We will see if by breaking up the stool she is able to pass it prior to doing disimpaction. ? ?First mineral oil enema did not work.  I did disimpaction see procedure note. ? ?Second x-ray of the abdomen was independently reviewed by me, still seems to have a large amount of stool ? ?Since we have removed some of the hard stool we will try another mineral oil enema ? ? Patient was able to have BM with second enema, large bowel movement noted in bedside commode.   ? ?Patient was given bowel regimen including miralax daily ?Follow up with general surgery for hemorrhoid removal ?Return to ER if worsening ? ? ? ? ?  ? ? ?FINAL CLINICAL IMPRESSION(S) / ED DIAGNOSES  ? ?Final diagnoses:  ?Constipation, unspecified constipation type  ?Hemorrhoids, unspecified hemorrhoid type  ? ? ? ?Rx / DC Orders  ? ?ED Discharge Orders   ? ?       Ordered  ?  hydrocortisone (ANUSOL-HC) 25 MG suppository  Every 12 hours       ? 04/22/21 1524  ?  polyethylene glycol powder (GLYCOLAX/MIRALAX) 17 GM/SCOOP powder       ? 04/22/21 1524  ? ?  ?  ? ?  ? ? ? ?Note:  This document was prepared using Dragon voice recognition software and may include unintentional dictation errors. ? ?  ?Versie Starks, PA-C ?04/22/21 1600 ? ?  ?Versie Starks, PA-C ?04/22/21 1611 ? ?  ?Delman Kitten, MD ?04/23/21 1834 ? ?

## 2021-04-22 NOTE — ED Triage Notes (Signed)
Patient C/O constipation. Reports last BM X 5 days ago. HX hemorrhoids ? ?

## 2021-05-07 ENCOUNTER — Ambulatory Visit (INDEPENDENT_AMBULATORY_CARE_PROVIDER_SITE_OTHER): Payer: Medicare Other | Admitting: Surgery

## 2021-05-07 ENCOUNTER — Encounter: Payer: Self-pay | Admitting: Surgery

## 2021-05-07 VITALS — BP 145/74 | HR 80 | Temp 98.3°F | Ht 60.0 in | Wt 137.6 lb

## 2021-05-07 DIAGNOSIS — K5900 Constipation, unspecified: Secondary | ICD-10-CM | POA: Diagnosis not present

## 2021-05-07 DIAGNOSIS — K641 Second degree hemorrhoids: Secondary | ICD-10-CM | POA: Diagnosis not present

## 2021-05-07 NOTE — Patient Instructions (Signed)
You may take the Miralax 2 times a day, morning and evening. You may also increase your fiber pills to twice a day.  ? ?Advised to pursue a goal of 25 to 30 g of fiber daily.  Made aware that the majority of this may be through natural sources, but advised to be aware of actual consumption and to ensure minimal consumption by daily supplementation.  Various forms of supplements discussed.  Recommended Whole Psyllium husk, that mixes well with applesauce, or the powder which goes down well shaken with chocolate milk.  ?Strongly advised to consume more fluids to ensure adequate hydration, instructed to watch color of urine to determine adequacy of hydration.  Clarity is pursued in urine output, and bowel activity that correlates to significant meal intake.   ?We need to avoid deferring having bowel movements, advised to take the time at the first sign of sensation, typically following meals, and in the morning.   ?Subsequent utilization of MiraLAX may be needed ensure at least daily movement, ideally twice daily bowel movements.  If multiple doses of MiraLAX are necessary utilize them. ?Never skip a day...  ?To be regular, we must do the above EVERY day.  ? ?Follow-up with our office as needed. ? ?Please call and ask to speak with a nurse if you develop questions or concerns. ? ? ?Fiber Content in Foods ?Fiber is a substance that is found in plant foods, such as fruits, vegetables, whole grains, nuts, seeds, and beans. As part of your treatment and recovery plan, your health care provider may recommend that you eat foods that have specific amounts of dietary fiber. Some conditions may require a high-fiber diet while others may require a low-fiber diet. ?This sheet gives you information about the dietary fiber content of some common foods. Your health care provider will tell you how much fiber you need in your diet. If you have problems or questions, contact your health care provider or dietitian. ?What foods are high  in fiber? ?Fruits ?Blackberries or raspberries (fresh) -- ? cup (75 g) has 4 g of fiber. ?Pear (fresh) -- 1 medium (180 g) has 5.5 g of fiber. ?Prunes (dried) -- 6 to 8 pieces (57-76 g) has 5 g of fiber. ?Apple with skin -- 1 medium (182 g) has 4.8 g of fiber. ?Guava -- 1 cup (128 g) has 8.9 g of fiber. ?Vegetables ?Peas (frozen) -- ? cup (80 g) has 4.4 g of fiber. ?Potato with skin (baked) -- 1 medium (173 g) has 4.4 g of fiber. ?Pumpkin (canned) -- ? cup (122 g) has 5 g of fiber. ?Brussels sprouts (cooked) -- ? cup (78 g) has 4 g of fiber. ?Sweet potato -- ? cup mashed (124 g) has 4 g of fiber. ?Winter squash -- 1 cup cooked (205 g) has 5.7 g of fiber. ?Grains ?Bran cereal -- ? cup (31 g) has 8.6 g of fiber. ?Bulgur (cooked) -- ? cup (70 g) has 4 g of fiber. ?Quinoa (cooked) -- 1 cup (185 g) has 5.2 g of fiber. ?Popcorn -- 3 cups (375 g) popped has 5.8 g of fiber. ?Spaghetti, whole wheat -- 1 cup (140 g) has 6 g of fiber. ?Meats and other proteins ?Pinto beans (cooked) -- ? cup (90 g) has 7.7 g of fiber. ?Lentils (cooked) -- ? cup (90 g) has 7.8 g of fiber. ?Kidney beans (canned) -- ? cup (92.5 g) has 5.7 g of fiber. ?Soybeans (canned, frozen, or fresh) -- ? cup (92.5 g) has  5.2 g of fiber. ?Baked beans, plain or vegetarian (canned) -- ? cup (130 g) has 5.2 g of fiber. ?Garbanzo beans or chickpeas (canned) -- ? cup (90 g) has 6.6 g of fiber. ?Black beans (cooked) -- ? cup (86 g) has 7.5 g of fiber. ?White beans or navy beans (cooked) -- ? cup (91 g) has 9.3 g of fiber. ?The items listed above may not be a complete list of foods with high fiber. Actual amounts of fiber may be different depending on processing. Contact a dietitian for more information. ?What foods are moderate in fiber? ?Fruits ?Banana -- 1 medium (126 g) has 3.2 g of fiber. ?Melon -- 1 cup (155 g) has 1.4 g of fiber. ?Orange -- 1 small (154 g) has 3.7 g of fiber. ?Raisins -- ? cup (40 g) has 1.8 g of fiber. ?Applesauce, sweetened -- ? cup (125 g)  has 1.5 g of fiber. ?Blueberries (fresh) -- ? cup (75 g) has 1.8 g of fiber. ?Strawberries (fresh, sliced) -- 1 cup (150 g) has 3 g of fiber. ?Cherries -- 1 cup (140 g) has 2.9 g of fiber. ?Vegetables ?Broccoli (cooked) -- ? cup (77.5 g) has 2.1 g of fiber. ?Carrots (cooked) -- ? cup (77.5 g) has 2.2 g of fiber. ?Corn (canned or frozen) -- ? cup (82.5 g) has 2.1 g of fiber. ?Potatoes, mashed -- ? cup (105 g) has 1.6 g of fiber. ?Tomato -- 1 medium (62 g) has 1.5 g of fiber. ?Green beans (canned) -- ? cup (83 g) has 2 g of fiber. ?Squash, winter -- ? cup (58 g) has 1 g of fiber. ?Sweet potato, baked -- 1 medium (150 g) has 3 g of fiber. ?Cauliflower (cooked) -- 1/2 cup (90 g) has 2.3 g of fiber. ?Grains ?Long-grain brown rice (cooked) -- 1 cup (196 g) has 3.5 g of fiber. ?Bagel, plain -- one 4-inch (10 cm) bagel has 2 g of fiber. ?Instant oatmeal -- ? cup (120 g) has about 2 g of fiber. ?Macaroni noodles, enriched (cooked) -- 1 cup (140 g) has 2.5 g of fiber. ?Multigrain cereal -- ? cup (15 g) has about 2-4 g of fiber. ?Whole-wheat bread -- 1 slice (26 g) has 2 g of fiber. ?Whole-wheat spaghetti noodles -- ? cup (70 g) has 3.2 g of fiber. ?Corn tortilla -- one 6-inch (15 cm) tortilla has 1.5 g of fiber. ?Meats and other proteins ?Almonds -- ? cup or 1 oz (28 g) has 3.5 g of fiber. ?Sunflower seeds in shell -- ? cup or ? oz (11.5 g) has 1.1 g of fiber. ?Vegetable or soy patty -- 1 patty (70 g) has 3.4 g of fiber. ?Walnuts -- ? cup or 1 oz (30 g) has 2 g of fiber. ?Flax seed -- 1 Tbsp (7 g) has 2.8 g of fiber. ?The items listed above may not be a complete list of foods that have moderate amounts of fiber. Actual amounts of fiber may be different depending on processing. Contact a dietitian for more information. ?What foods are low in fiber? ?Low-fiber foods contain less than 1 g of fiber per serving. They include: ?Fruits ?Fruit juice -- ? cup or 4 fl oz (118 mL) has 0.5 g of fiber. ?Vegetables ?Lettuce -- 1 cup (35 g)  has 0.5 g of fiber. ?Cucumber (slices) -- ? cup (60 g) has 0.3 g of fiber. ?Celery -- 1 stalk (40 g) has 0.1 g of fiber. ?Grains ?Flour tortilla -- one 6-inch (  15 cm) tortilla has 0.5 g of fiber. ?White rice (cooked) -- ? cup (81.5 g) has 0.3 g of fiber. ?Meats and other proteins ?Egg -- 1 large (50 g) has 0 g of fiber. ?Meat, poultry, or fish -- 3 oz (85 g) has 0 g of fiber. ?Dairy ?Milk -- 1 cup or 8 fl oz (237 mL) has 0 g of fiber. ?Yogurt -- 1 cup (245 g) has 0 g of fiber. ?The items listed above may not be a complete list of foods that are low in fiber. Actual amounts of fiber may be different depending on processing. Contact a dietitian for more information. ?Summary ?Fiber is a substance that is found in plant foods, such as fruits, vegetables, whole grains, nuts, seeds, and beans. ?As part of your treatment and recovery plan, your health care provider may recommend that you eat foods that have specific amounts of dietary fiber. ?This information is not intended to replace advice given to you by your health care provider. Make sure you discuss any questions you have with your health care provider. ?Document Revised: 05/17/2019 Document Reviewed: 05/17/2019 ?Elsevier Patient Education ? 2022 [employer] Inc[employer] ? ?

## 2021-05-08 ENCOUNTER — Other Ambulatory Visit: Payer: Self-pay | Admitting: Cardiology

## 2021-05-08 DIAGNOSIS — R0602 Shortness of breath: Secondary | ICD-10-CM

## 2021-05-08 DIAGNOSIS — I10 Essential (primary) hypertension: Secondary | ICD-10-CM

## 2021-05-08 MED ORDER — ASCORBIC ACID 500 MG PO TABS: ORAL_TABLET | ORAL | Status: DC

## 2021-05-08 NOTE — Progress Notes (Signed)
Patient ID: Susan Sosa, female   DOB: [birth year], 81 y.o.   MRN: [id number] ? ?Chief Complaint: Hemorrhoidal pain ? ?History of Present Illness ?Susan Sosa is a 81 y.o. female with 20+ year history of hemorrhoids.  Most recently began irritating her with episode of constipation over the last 2 to 3 weeks.  She reports her constipation is medication related, and she takes MiraLAX, mineral oil and fiber pills.  She denies seeing any blood in her stools, nor bright red blood with her bowel movements.  Her biggest complaint is pain with bowel movements when she is constipated and needing to strain.  Otherwise she utilizes Preparation H and Anusol suppositories at times with reasonable relief.  She had a colonoscopy about 5 years ago that was reportedly negative.  Apparently her husband had a severe episode with hemorrhoidal disease resulting in some significant bleeding.  ? ?Past Medical History ?Past Medical History:  ?Diagnosis Date  ? Arthritis   ? Heart murmur   ? Hyperlipidemia   ? Hypertension   ? Hyponatremia   ? IPF (idiopathic pulmonary fibrosis) (HCC)   ?  ? ? ?No past surgical history on file. ? ?Allergies  ?Allergen Reactions  ? Latex Itching and Rash  ? Lidocaine Swelling  ?  Facial swelling from a dentist ?Facial swelling from a dentist ?Facial swelling from a dentist ?  ? Atorvastatin   ?  Severe hip pain  ? Losartan Swelling  ? Metronidazole Other (See Comments)  ?  Sore joints ?Sore joints ?  ? Oxycodone Nausea Only  ? ? ?Current Outpatient Medications  ?Medication Sig Dispense Refill  ? albuterol (PROVENTIL) (2.5 MG/3ML) 0.083% nebulizer solution Inhale into the lungs.    ? aspirin 81 MG chewable tablet aspirin 81 mg chewable tablet ?  81 mg by oral route.    ? atorvastatin (LIPITOR) 40 MG tablet Take 1 tablet by mouth daily.    ? Calcium Polycarbophil (FIBER-CAPS PO) Take by mouth.    ? calcium-vitamin D (OSCAL WITH D) 500-200 MG-UNIT TABS tablet Take 1 tablet by mouth daily.    ? cyanocobalamin 1000  MCG tablet cyanocobalamin (vit B-12) 1,000 mcg tablet ?  1000 ugs by oral route.    ? esomeprazole (NEXIUM) 40 MG capsule Take 1 capsule by mouth at bedtime.    ? hydrALAZINE (APRESOLINE) 10 MG tablet TAKE 1 TABLET BY MOUTH NIGHTLY AS NEEDED    ? hydrocortisone (ANUSOL-HC) 25 MG suppository Place 1 suppository (25 mg total) rectally every 12 (twelve) hours. 12 suppository 1  ? levalbuterol (XOPENEX) 1.25 MG/3ML nebulizer solution Inhale into the lungs.    ? magnesium oxide (MAG-OX) 400 MG tablet magnesium oxide 400 mg (241.3 mg magnesium) tablet ?  400 mg twice a day by oral route.    ? multivitamin-iron-minerals-folic acid (CENTRUM) chewable tablet Chew by mouth.    ? Omega-3 Fatty Acids (FISH OIL) 1000 MG CAPS Take by mouth.    ? OXYGEN Inhale 2 L into the lungs continuous.    ? Pirfenidone (ESBRIET) 267 MG CAPS Take by mouth.    ? polyethylene glycol powder (GLYCOLAX/MIRALAX) 17 GM/SCOOP powder 1 capful once a day , may use 2 per day if continued constipation 510 g 0  ? prednisoLONE acetate (PRED FORTE) 1 % ophthalmic suspension prednisolone acetate 1 % eye drops,suspension ? INSTILL 1 DROP INTO LEFT EYE 3 TIMES A DAY    ? triamterene-hydrochlorothiazide (MAXZIDE) 75-50 MG tablet TAKE 1 BY MOUTH EVERY DAY.    ?  verapamil (CALAN-SR) 180 MG CR tablet Take by mouth.    ? vitamin E 180 MG (400 UNITS) capsule vitamin E 268 mg (400 unit) capsule ?  400 units by oral route.    ? ascorbic acid (VITAMIN C) 500 MG tablet ascorbic acid (vitamin C) 500 mg tablet   1  by oral route.    ? ?No current facility-administered medications for this visit.  ? ? ?Family History ?No family history on file.  ? ? ?Social History ?Social History  ? ?Tobacco Use  ? Smoking status: Former  ? Smokeless tobacco: Never  ?Substance Use Topics  ? Alcohol use: Never  ? Drug use: Never  ?  ? ?Review of Systems  ?Constitutional: Negative.   ?HENT: Negative.    ?Eyes: Negative.   ?Respiratory:  Positive for cough.   ?Cardiovascular: Negative.    ?Gastrointestinal:  Positive for constipation and diarrhea.  ?Genitourinary: Negative.   ?Skin: Negative.   ?Neurological: Negative.   ?Psychiatric/Behavioral: Negative.    ?  ? ?Physical Exam ?Blood pressure (!) 145/74, pulse 80, temperature 98.3 ?F (36.8 ?C), height 5' (1.524 m), weight 137 lb 9.6 oz (62.4 kg), SpO2 96 %. ?Last Weight  Most recent update: 05/07/2021 10:29 AM  ? ? Weight  ?62.4 kg (137 lb 9.6 oz)  ?      ? ?  ? ? ?CONSTITUTIONAL: Well developed, and nourished, appropriately responsive and aware without distress.   ?EYES: Sclera non-icteric.   ?EARS, NOSE, MOUTH AND THROAT:  The oropharynx is clear. Oral mucosa is pink and moist.   Hearing is intact to voice.  ?NECK: Trachea is midline, and there is no jugular venous distension.  ?LYMPH NODES:  Lymph nodes in the neck are not enlarged. ?RESPIRATORY:  Lungs are clear, and breath sounds are equal bilaterally. Normal respiratory effort without pathologic use of accessory muscles. ?CARDIOVASCULAR: Heart is regular in rate and rhythm. ?GI: The abdomen is soft, nontender, and nondistended. There were no palpable masses. I did not appreciate hepatosplenomegaly. There were normal bowel sounds. ?GU: DRE, Caryl Lyn as chaperone.  Minimal soft external hemorrhoids without inflammatory changes.  No evidence of fissure or sentinel tag.  No remarkable internal hemorrhoidal redundancy, palpable cords or significant piles. ?MUSCULOSKELETAL:  Symmetrical muscle tone appreciated in all four extremities.    ?SKIN: Skin turgor is normal. No pathologic skin lesions appreciated.  ?NEUROLOGIC:  Motor and sensation appear grossly normal.  Cranial nerves are grossly without defect. ?PSYCH:  Alert and oriented to person, place and time. Affect is appropriate for situation. ? ?Data Reviewed ?I have personally reviewed what is currently available of the patient's imaging, recent labs and medical records.   ?Labs:  ?   ? View : No data to display.  ?  ?  ?  ? ?   ? View : No  data to display.  ?  ?  ?  ? ? ? ? ?Imaging: ? ?Within last 24 hrs: No results found. ? ?Assessment ?   ?Grade 1-2 internal hemorrhoids with minimal external hemorrhoidal tags.  Pain appears to be associated with episodes of constipation. ?Patient Active Problem List  ? Diagnosis Date Noted  ? Degeneration of lumbosacral intervertebral disc 08/04/2020  ? Full thickness rotator cuff tear 08/04/2020  ? Osteoarthritis of knee 08/04/2020  ? Systolic murmur 09/04/2019  ? Idiopathic pulmonary fibrosis (HCC) 02/26/2019  ? 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 Intermediate stage nonexudative age-related macular degeneration of both eyes 10/21/2017  ? Posterior vitreous  detachment of left eye 10/21/2017  ? Presence of intraocular lens 10/21/2017  ? 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  ? Hypomagnesemia 09/07/2017  ? Prediabetes 04/18/2016  ? Bilateral renal cysts 07/18/2014  ? Benign essential hypertension 07/18/2014  ? Paroxysmal atrial tachycardia (HCC) 07/18/2014  ? Dyspnea 08/10/2011  ? Hypercholesterolemia 08/10/2011  ? ? ?Plan ?   ?I believe we can achieve freedom from pain through measures of being more aggressive with her bowel regimen and achieving regularity.  Hopefully this will alleviate the roller coaster of alternating constipation with diarrhea. ?We discussed doubling her fiber intake, and encouraging her to be more aggressive with the MiraLAX doubling it as well.  I believe we can achieve bowel movements that are consistently easy to pass. ?Advised to pursue a goal of 25 to 30 g of fiber daily.  Made aware that the majority of this may be through natural sources, but advised to be aware of actual consumption and to ensure minimal consumption by daily supplementation.  Various forms of supplements discussed.  Recommended Psyllium husk, that mixes well with applesauce, or the powder which goes down well shaken with chocolate milk.  ?Strongly advised to consume more fluids to ensure adequate hydration,  instructed to watch color of urine to determine adequacy of hydration.  Clarity is pursued in urine output, and bowel activity that correlates to significant meal intake.   ?We need to avoid deferring havi

## 2021-05-13 ENCOUNTER — Ambulatory Visit
Admission: RE | Admit: 2021-05-13 | Discharge: 2021-05-13 | Disposition: A | Discharge status: Home / Self Care | Payer: Medicare Other | Source: Ambulatory Visit | Attending: Cardiology | Admitting: Cardiology

## 2021-05-13 DIAGNOSIS — I10 Essential (primary) hypertension: Secondary | ICD-10-CM | POA: Diagnosis present

## 2021-05-13 DIAGNOSIS — R0602 Shortness of breath: Secondary | ICD-10-CM | POA: Diagnosis present

## 2021-05-13 DIAGNOSIS — Q6102 Congenital multiple renal cysts: Secondary | ICD-10-CM | POA: Diagnosis not present

## 2021-05-13 DIAGNOSIS — I700 Atherosclerosis of aorta: Secondary | ICD-10-CM | POA: Insufficient documentation

## 2021-10-16 ENCOUNTER — Other Ambulatory Visit: Payer: Self-pay | Admitting: Unknown Physician Specialty

## 2021-10-16 DIAGNOSIS — H93A1 Pulsatile tinnitus, right ear: Secondary | ICD-10-CM

## 2021-10-22 ENCOUNTER — Ambulatory Visit
Admission: RE | Admit: 2021-10-22 | Discharge: 2021-10-22 | Disposition: A | Discharge status: Home / Self Care | Payer: Medicare Other | Source: Ambulatory Visit | Attending: Unknown Physician Specialty | Admitting: Unknown Physician Specialty

## 2021-10-22 DIAGNOSIS — H93A1 Pulsatile tinnitus, right ear: Secondary | ICD-10-CM

## 2021-10-26 ENCOUNTER — Ambulatory Visit
Admission: RE | Admit: 2021-10-26 | Discharge: 2021-10-26 | Disposition: A | Discharge status: Home / Self Care | Payer: Medicare Other | Source: Ambulatory Visit | Attending: Unknown Physician Specialty | Admitting: Unknown Physician Specialty

## 2021-10-26 DIAGNOSIS — H93A1 Pulsatile tinnitus, right ear: Secondary | ICD-10-CM

## 2021-11-26 ENCOUNTER — Other Ambulatory Visit: Payer: Self-pay

## 2021-11-26 ENCOUNTER — Emergency Department: Payer: Medicare Other

## 2021-11-26 ENCOUNTER — Encounter: Payer: Self-pay | Admitting: Emergency Medicine

## 2021-11-26 ENCOUNTER — Inpatient Hospital Stay
Admission: EM | Admit: 2021-11-26 | Discharge: 2021-11-28 | DRG: 281 | Disposition: A | Discharge status: Home / Self Care | Payer: Medicare Other | Attending: Internal Medicine | Admitting: Internal Medicine

## 2021-11-26 DIAGNOSIS — I214 Non-ST elevation (NSTEMI) myocardial infarction: Secondary | ICD-10-CM | POA: Diagnosis present

## 2021-11-26 DIAGNOSIS — Z9981 Dependence on supplemental oxygen: Secondary | ICD-10-CM | POA: Diagnosis not present

## 2021-11-26 DIAGNOSIS — E871 Hypo-osmolality and hyponatremia: Secondary | ICD-10-CM | POA: Diagnosis present

## 2021-11-26 DIAGNOSIS — Z87891 Personal history of nicotine dependence: Secondary | ICD-10-CM

## 2021-11-26 DIAGNOSIS — J84112 Idiopathic pulmonary fibrosis: Secondary | ICD-10-CM | POA: Diagnosis present

## 2021-11-26 DIAGNOSIS — Z7982 Long term (current) use of aspirin: Secondary | ICD-10-CM | POA: Diagnosis not present

## 2021-11-26 DIAGNOSIS — I4719 Other supraventricular tachycardia: Secondary | ICD-10-CM | POA: Diagnosis present

## 2021-11-26 DIAGNOSIS — I5181 Takotsubo syndrome: Secondary | ICD-10-CM | POA: Diagnosis present

## 2021-11-26 DIAGNOSIS — Z20822 Contact with and (suspected) exposure to covid-19: Secondary | ICD-10-CM | POA: Diagnosis present

## 2021-11-26 DIAGNOSIS — I10 Essential (primary) hypertension: Secondary | ICD-10-CM | POA: Diagnosis present

## 2021-11-26 DIAGNOSIS — Z79899 Other long term (current) drug therapy: Secondary | ICD-10-CM

## 2021-11-26 DIAGNOSIS — I2721 Secondary pulmonary arterial hypertension: Secondary | ICD-10-CM | POA: Diagnosis present

## 2021-11-26 DIAGNOSIS — J9611 Chronic respiratory failure with hypoxia: Secondary | ICD-10-CM | POA: Diagnosis present

## 2021-11-26 DIAGNOSIS — E785 Hyperlipidemia, unspecified: Secondary | ICD-10-CM | POA: Diagnosis present

## 2021-11-26 LAB — CBC
HCT: 38.2 % (ref 36.0–46.0)
Hemoglobin: 12.8 g/dL (ref 12.0–15.0)
MCH: 28.6 pg (ref 26.0–34.0)
MCHC: 33.5 g/dL (ref 30.0–36.0)
MCV: 85.5 fL (ref 80.0–100.0)
Platelets: 304 10*3/uL (ref 150–400)
RBC: 4.47 MIL/uL (ref 3.87–5.11)
RDW: 12.7 % (ref 11.5–15.5)
WBC: 8.5 10*3/uL (ref 4.0–10.5)
nRBC: 0 % (ref 0.0–0.2)

## 2021-11-26 LAB — BASIC METABOLIC PANEL
Anion gap: 11 (ref 5–15)
BUN: 20 mg/dL (ref 8–23)
CO2: 27 mmol/L (ref 22–32)
Calcium: 9.3 mg/dL (ref 8.9–10.3)
Chloride: 92 mmol/L — ABNORMAL LOW (ref 98–111)
Creatinine, Ser: 0.91 mg/dL (ref 0.44–1.00)
GFR, Estimated: >60 mL/min (ref >60)
Glucose, Bld: 139 mg/dL — ABNORMAL HIGH (ref 70–99)
Potassium: 3.7 mmol/L (ref 3.5–5.1)
Sodium: 130 mmol/L — ABNORMAL LOW (ref 135–145)

## 2021-11-26 LAB — TROPONIN I (HIGH SENSITIVITY)
Troponin I (High Sensitivity): 2557 ng/L (ref <18)
Troponin I (High Sensitivity): 6928 ng/L (ref <18)
Troponin I (High Sensitivity): 7167 ng/L (ref <18)

## 2021-11-26 LAB — PROTIME-INR
INR: 1 (ref 0.8–1.2)
Prothrombin Time: 13.3 seconds (ref 11.4–15.2)

## 2021-11-26 LAB — SARS CORONAVIRUS 2 BY RT PCR: SARS Coronavirus 2 by RT PCR: NEGATIVE

## 2021-11-26 LAB — APTT: aPTT: 32 seconds (ref 24–36)

## 2021-11-26 MED ORDER — IPRATROPIUM-ALBUTEROL 0.5-2.5 (3) MG/3ML IN SOLN
3.0000 mL | Freq: Once | RESPIRATORY_TRACT | Status: AC
  Administered 2021-11-26: 3 mL via RESPIRATORY_TRACT
  Filled 2021-11-26: qty 3

## 2021-11-26 MED ORDER — IOHEXOL 350 MG/ML SOLN
100.0000 mL | Freq: Once | INTRAVENOUS | Status: AC | PRN
  Administered 2021-11-26: 100 mL via INTRAVENOUS

## 2021-11-26 MED ORDER — HEPARIN BOLUS VIA INFUSION
3500.0000 [IU] | Freq: Once | INTRAVENOUS | Status: AC
  Administered 2021-11-26: 3500 [IU] via INTRAVENOUS
  Filled 2021-11-26: qty 3500

## 2021-11-26 MED ORDER — ACETAMINOPHEN 325 MG PO TABS
650.0000 mg | ORAL_TABLET | ORAL | Status: DC | PRN
  Administered 2021-11-27: 650 mg via ORAL
  Filled 2021-11-26: qty 2

## 2021-11-26 MED ORDER — PIRFENIDONE 267 MG PO CAPS: 267.0000 mg | ORAL_CAPSULE | Freq: Three times a day (TID) | ORAL | Status: DC

## 2021-11-26 MED ORDER — NITROGLYCERIN 2 % TD OINT
1.0000 [in_us] | TOPICAL_OINTMENT | Freq: Once | TRANSDERMAL | Status: AC
  Administered 2021-11-26: 1 [in_us] via TOPICAL
  Filled 2021-11-26: qty 1

## 2021-11-26 MED ORDER — ONDANSETRON HCL 4 MG/2ML IJ SOLN: 4.0000 mg | Freq: Four times a day (QID) | INTRAMUSCULAR | Status: DC | PRN

## 2021-11-26 MED ORDER — PREDNISOLONE ACETATE 1 % OP SUSP
1.0000 [drp] | Freq: Three times a day (TID) | OPHTHALMIC | Status: DC
  Administered 2021-11-27: 1 [drp] via OPHTHALMIC
  Filled 2021-11-26: qty 1

## 2021-11-26 MED ORDER — ASPIRIN 81 MG PO TBEC
81.0000 mg | DELAYED_RELEASE_TABLET | Freq: Every day | ORAL | Status: DC
  Administered 2021-11-28: 81 mg via ORAL
  Filled 2021-11-26: qty 1

## 2021-11-26 MED ORDER — ASPIRIN 81 MG PO CHEW
324.0000 mg | CHEWABLE_TABLET | Freq: Once | ORAL | Status: AC
  Administered 2021-11-26: 324 mg via ORAL
  Filled 2021-11-26: qty 4

## 2021-11-26 MED ORDER — ATORVASTATIN CALCIUM 20 MG PO TABS
40.0000 mg | ORAL_TABLET | Freq: Every day | ORAL | Status: DC
  Administered 2021-11-28: 40 mg via ORAL
  Filled 2021-11-26: qty 2

## 2021-11-26 MED ORDER — ONDANSETRON HCL 4 MG/2ML IJ SOLN: 4.0000 mg | Freq: Once | INTRAMUSCULAR | Status: DC

## 2021-11-26 MED ORDER — PANTOPRAZOLE SODIUM 40 MG PO TBEC
40.0000 mg | DELAYED_RELEASE_TABLET | Freq: Every day | ORAL | Status: DC
  Administered 2021-11-28: 40 mg via ORAL
  Filled 2021-11-26: qty 1

## 2021-11-26 MED ORDER — VERAPAMIL HCL ER 180 MG PO TBCR
180.0000 mg | EXTENDED_RELEASE_TABLET | Freq: Every day | ORAL | Status: DC
  Administered 2021-11-28: 180 mg via ORAL
  Filled 2021-11-26 (×2): qty 1

## 2021-11-26 MED ORDER — MAGNESIUM OXIDE 400 MG PO TABS
400.0000 mg | ORAL_TABLET | Freq: Two times a day (BID) | ORAL | Status: DC
  Administered 2021-11-27 – 2021-11-28 (×2): 400 mg via ORAL
  Filled 2021-11-26 (×5): qty 1

## 2021-11-26 MED ORDER — FENTANYL CITRATE PF 50 MCG/ML IJ SOSY: 50.0000 ug | PREFILLED_SYRINGE | Freq: Once | INTRAMUSCULAR | Status: DC

## 2021-11-26 MED ORDER — NITROGLYCERIN 0.4 MG SL SUBL
0.4000 mg | SUBLINGUAL_TABLET | SUBLINGUAL | Status: DC | PRN
  Administered 2021-11-26 (×2): 0.4 mg via SUBLINGUAL
  Filled 2021-11-26: qty 1

## 2021-11-26 MED ORDER — HEPARIN (PORCINE) 25000 UT/250ML-% IV SOLN
900.0000 [IU]/h | INTRAVENOUS | Status: DC
  Administered 2021-11-26: 700 [IU]/h via INTRAVENOUS
  Filled 2021-11-26: qty 250

## 2021-11-26 MED ORDER — NITROGLYCERIN IN D5W 200-5 MCG/ML-% IV SOLN
0.0000 ug/min | INTRAVENOUS | Status: DC
  Administered 2021-11-26: 10 ug/min via INTRAVENOUS
  Filled 2021-11-26: qty 250

## 2021-11-26 NOTE — Assessment & Plan Note (Addendum)
Patient is normotensive and is being placed on nitroglycerin infusion -Continue holding home antihypertensives of hydralazine, and Maxide, furosemide as patient is on nitroglycerin Patient is on verapamil for MAT so no beta code started at this time

## 2021-11-26 NOTE — ED Notes (Signed)
Patient declines need for fentanyl or nitroglycerin at this time. Reports chest pain is currently a 2/10. Reports breathing has also improved.

## 2021-11-26 NOTE — Assessment & Plan Note (Addendum)
Patient presents with typical pain,Troponin [phone] He was on heparin infusion and cardiology was consulted Plan was to take him to cardiac cath later today -Follow-up post cath recommendations -Continue aspirin, and statin

## 2021-11-26 NOTE — ED Notes (Signed)
Patient reports chest pain is increasing and is now 4/10. Additional nitroglycerin SL given at this time.

## 2021-11-26 NOTE — ED Triage Notes (Signed)
C/O chest pain, emesis, HTN x 15 minutes.  Patient states has history of HTN -- takes HCTZ.  STates chest pain worse with inspiration  AAOx3.  Skin warm and dry. NAD  wears 2l/ [place] home oxygen with normal sats 92%

## 2021-11-26 NOTE — H&P (Signed)
History and Physical    Patient: Susan Sosa PXT:[id number] DOB: 08/28/1940 DOA: 11/26/2021 DOS: the patient was seen and examined on 11/26/2021 PCP: Marisue Ivan, MD  Patient coming from: Home  Chief Complaint:  Chief Complaint  Patient presents with   Chest Pain    HPI: Ivyana Locey is a 81 y.o. female with medical history significant for HTN, paroxysmal atrial tachycardia, chronic hyponatremia, interstitial pulmonary fibrosis with chronic respiratory failure on home O2 at 2 L who presents to the ED with chest pain.  Patient was awoken following a nap with severe retrosternal nonradiating chest tightness, associated with nausea, palpitations and lightheadedness and shortness of breath.  She has a chronic cough related to her interstitial pulmonary fibrosis.  She denies fever and chills.  She denies lower extremity pain or swelling.  Patient had an episode of nonbloody nonbilious vomiting on her way to the hospital. ED course and data review: BP 195/86 with otherwise unremarkable vitals.  Troponin [id number].  Sodium at baseline at 130.  COVID-negative.  EKG #2, personally viewed and interpreted showed NSR at 75 with no acute ST-T wave changes. CT angio chest negative for PE and showing the following findings: IMPRESSION: 1. No evidence for aortic dissection or aneurysm. 2. Findings compatible with pulmonary artery hypertension. 3. Findings compatible with interstitial lung disease with early fibrotic changes in the right lung base. Findings have minimally progressed compared to prior. 4. Stable mediastinal and hilar lymphadenopathy. 5. Stable 6 mm right lower lobe pulmonary nodule. Findings are favored as benign given stability since 2020. 6. Colonic diverticulosis. 7. Left Bosniak I benign renal cyst measuring 5.5 cm. No follow-up imaging is recommended.  Patient's initial EKG had shown questionable changes in anterior leads and cardiologist, Dr. Juliann Pares was consulted from the  ED and recommended running the EKG by on-call STEMI, Dr. Zena Amos who subsequently decided that EKG did not meet STEMI criteria Patient was given chewable aspirin and started on heparin infusion.  Nitroglycerin infusion was started due to residual mild chest pain.  Hospitalist consulted for admission.   Review of Systems: As mentioned in the history of present illness. All other systems reviewed and are negative.  Past Medical History:  Diagnosis Date   Arthritis    Heart murmur    Hyperlipidemia    Hypertension    Hyponatremia    IPF (idiopathic pulmonary fibrosis) (HCC)    History reviewed. No pertinent surgical history. Social History:  reports that she has quit smoking. She has never used smokeless tobacco. She reports that she does not drink alcohol and does not use drugs.  Allergies  Allergen Reactions   Latex Itching and Rash   Lidocaine Swelling    Facial swelling from a dentist Facial swelling from a dentist Facial swelling from a dentist    Atorvastatin     Severe hip pain   Losartan Swelling   Metronidazole Other (See Comments)    Sore joints Sore joints    Oxycodone Nausea Only    No family history on file.  Prior to Admission medications   Medication Sig Start Date End Date Taking? Authorizing Provider  albuterol (PROVENTIL) (2.5 MG/3ML) 0.083% nebulizer solution Inhale into the lungs. 11/13/19  Yes [provider]  ascorbic acid (VITAMIN C) 500 MG tablet ascorbic acid (vitamin C) 500 mg tablet   1  by oral route. 05/08/21  Yes Campbell Lerner, MD  aspirin 81 MG chewable tablet aspirin 81 mg chewable tablet   81 mg by oral route.  Yes [provider]  atorvastatin (LIPITOR) 40 MG tablet Take 1 tablet by mouth daily. 06/27/20  Yes [provider]  calcium-vitamin D (OSCAL WITH D) 500-200 MG-UNIT TABS tablet Take 1 tablet by mouth daily.   Yes [provider]  cloNIDine (CATAPRES) 0.1 MG tablet Take 0.1 mg by mouth 2 (two)  times daily.   Yes [provider]  cyanocobalamin 1000 MCG tablet cyanocobalamin (vit B-12) 1,000 mcg tablet   1000 ugs by oral route.   Yes [provider]  esomeprazole (NEXIUM) 40 MG capsule Take 1 capsule by mouth at bedtime. 05/30/20  Yes [provider]  furosemide (LASIX) 20 MG tablet Take 1 tablet by mouth daily. 09/11/21  Yes [provider]  magnesium oxide (MAG-OX) 400 MG tablet magnesium oxide 400 mg (241.3 mg magnesium) tablet   400 mg twice a day by oral route. 01/20/15  Yes [provider]  multivitamin-iron-minerals-folic acid (CENTRUM) chewable tablet Chew by mouth.   Yes [provider]  Omega-3 Fatty Acids (FISH OIL) 1000 MG CAPS Take by mouth.   Yes [provider]  ondansetron (ZOFRAN) 8 MG tablet TAKE 1 TABLET BY MOUTH EVERY 12 HOURS AS NEEDED FOR NAUSEA. 09/29/21  Yes [provider]  Pirfenidone (ESBRIET) 267 MG CAPS Take 267 mg by mouth with breakfast, with lunch, and with evening meal. 11/21/19  Yes [provider]  polyethylene glycol powder (GLYCOLAX/MIRALAX) 17 GM/SCOOP powder 1 capful once a day , may use 2 per day if continued constipation 04/22/21  Yes Fisher, Linden Dolin, PA-C  prednisoLONE acetate (PRED FORTE) 1 % ophthalmic suspension prednisolone acetate 1 % eye drops,suspension  INSTILL 1 DROP INTO LEFT EYE 3 TIMES A DAY   Yes [provider]  triamcinolone cream (KENALOG) 0.1 % Apply topically 3 (three) times daily. 08/11/21  Yes [provider]  triamterene-hydrochlorothiazide (MAXZIDE) 75-50 MG tablet TAKE 1 BY MOUTH EVERY DAY. 03/27/13  Yes [provider]  verapamil (CALAN-SR) 180 MG CR tablet Take by mouth. 07/21/20  Yes [provider]  vitamin E 180 MG (400 UNITS) capsule vitamin E 268 mg (400 unit) capsule   400 units by oral route.   Yes [provider]  Calcium Polycarbophil (FIBER-CAPS PO) Take by mouth.    [provider]   diazepam (VALIUM) 5 MG tablet Take by mouth. Patient not taking: Reported on 11/26/2021 10/21/21   [provider]  hydrALAZINE (APRESOLINE) 10 MG tablet TAKE 1 TABLET BY MOUTH NIGHTLY AS NEEDED Patient not taking: Reported on 11/26/2021 07/04/20   [provider]  hydrocortisone (ANUSOL-HC) 25 MG suppository Place 1 suppository (25 mg total) rectally every 12 (twelve) hours. 04/22/21 04/22/22  Fisher, Linden Dolin, PA-C  levalbuterol Penne Lash) 1.25 MG/3ML nebulizer solution Inhale into the lungs. 03/13/19   [provider]  OXYGEN Inhale 2 L into the lungs continuous.    [provider]    Physical Exam: Vitals:   11/26/21 1920 11/26/21 2000 11/26/21 2100 11/26/21 2128  BP: (!) 141/72 (!) 145/72 (!) 161/82 126/71  Pulse: 83 77 78 81  Resp: (!) 28 19 (!) 22 (!) 22  Temp:  98 F (36.7 C)    TempSrc:  Oral    SpO2: 93% 100% 98% 94%  Weight:      Height:       Physical Exam Vitals and nursing note reviewed.  Constitutional:      General: She is not in acute distress. HENT:  Head: Normocephalic and atraumatic.  Cardiovascular:     Rate and Rhythm: Normal rate and regular rhythm.     Heart sounds: Normal heart sounds.  Pulmonary:     Effort: Pulmonary effort is normal.     Breath sounds: Normal breath sounds.  Abdominal:     Palpations: Abdomen is soft.     Tenderness: There is no abdominal tenderness.  Neurological:     Mental Status: Mental status is at baseline.     Labs on Admission: I have personally reviewed following labs and imaging studies  CBC: Recent Labs  Lab 11/26/21 1639  WBC 8.5  HGB 12.8  HCT 38.2  MCV 85.5  PLT 304   Basic Metabolic Panel: Recent Labs  Lab 11/26/21 1639  NA 130*  K 3.7  CL 92*  CO2 27  GLUCOSE 139*  BUN 20  CREATININE 0.91  CALCIUM 9.3   GFR: Estimated Creatinine Clearance: 38.7 mL/min (by C-G formula based on SCr of 0.91 mg/dL). Liver Function Tests: No results for input(s): "AST", "ALT",  "ALKPHOS", "BILITOT", "PROT", "ALBUMIN" in the last 168 hours. No results for input(s): "LIPASE", "AMYLASE" in the last 168 hours. No results for input(s): "AMMONIA" in the last 168 hours. Coagulation Profile: Recent Labs  Lab 11/26/21 1639  INR 1.0   Cardiac Enzymes: No results for input(s): "CKTOTAL", "CKMB", "CKMBINDEX", "TROPONINI" in the last 168 hours. BNP (last 3 results) No results for input(s): "PROBNP" in the last 8760 hours. HbA1C: No results for input(s): "HGBA1C" in the last 72 hours. CBG: No results for input(s): "GLUCAP" in the last 168 hours. Lipid Profile: No results for input(s): "CHOL", "HDL", "LDLCALC", "TRIG", "CHOLHDL", "LDLDIRECT" in the last 72 hours. Thyroid Function Tests: No results for input(s): "TSH", "T4TOTAL", "FREET4", "T3FREE", "THYROIDAB" in the last 72 hours. Anemia Panel: No results for input(s): "VITAMINB12", "FOLATE", "FERRITIN", "TIBC", "IRON", "RETICCTPCT" in the last 72 hours. Urine analysis: No results found for: "COLORURINE", "APPEARANCEUR", "LABSPEC", "PHURINE", "GLUCOSEU", "HGBUR", "BILIRUBINUR", "KETONESUR", "PROTEINUR", "UROBILINOGEN", "NITRITE", "LEUKOCYTESUR"  Radiological Exams on Admission: CT Angio Chest/Abd/Pel for Dissection W and/or Wo Contrast  Result Date: 11/26/2021 CLINICAL DATA:  Chest and back pain, emesis. EXAM: CT ANGIOGRAPHY CHEST, ABDOMEN AND PELVIS TECHNIQUE: Non-contrast CT of the chest was initially obtained. Multidetector CT imaging through the chest, abdomen and pelvis was performed using the standard protocol during bolus administration of intravenous contrast. Multiplanar reconstructed images and MIPs were obtained and reviewed to evaluate the vascular anatomy. RADIATION DOSE REDUCTION: This exam was performed according to the departmental dose-optimization program which includes automated exposure control, adjustment of the mA and/or kV according to patient size and/or use of iterative reconstruction technique.  CONTRAST:  OMNIPAQUE IOHEXOL 350 MG/ML SOLN COMPARISON:  CT chest 04/11/2018 FINDINGS: CTA CHEST FINDINGS Cardiovascular: Preferential opacification of the thoracic aorta. No evidence of thoracic aortic aneurysm or dissection. Normal heart size. No pericardial effusion. There are atherosclerotic calcifications of the aorta. Main pulmonary artery is enlarged compatible with pulmonary artery hypertension. No large central pulmonary embolism. Mediastinum/Nodes: There is a small hiatal hernia. Esophagus is within normal limits. There is a hypodense right thyroid nodule measuring 7 mm which is unchanged from prior. There is an enlarged precarinal lymph node measuring 15 mm short axis, unchanged. There are prominent bilateral hilar lymph nodes. Lungs/Pleura: There is a stable 6 mm fissural nodule in the right lower lobe image 5/77. Again seen are peripheral ground-glass and subpleural reticulations. There some bronchiectasis in the right lower lobe, unchanged. Early fibrotic changes are  seen in the right lung base. Findings have minimally progressed compared to prior. No focal lung consolidation, pleural effusion or pneumothorax. Musculoskeletal: No chest wall abnormality. No acute or significant osseous findings. Review of the MIP images confirms the above findings. CTA ABDOMEN AND PELVIS FINDINGS VASCULAR Aorta: Normal caliber aorta without aneurysm, dissection, vasculitis or significant stenosis. There are atherosclerotic calcifications of the aorta. Celiac: Patent without evidence of aneurysm, dissection, vasculitis or significant stenosis. SMA: Patent without evidence of aneurysm, dissection, vasculitis or significant stenosis. Renals: Both renal arteries are patent without evidence of aneurysm, dissection, vasculitis, fibromuscular dysplasia or significant stenosis. IMA: Patent without evidence of aneurysm, dissection, vasculitis or significant stenosis. Inflow: Patent without evidence of aneurysm, dissection,  vasculitis or significant stenosis. Veins: No obvious venous abnormality within the limitations of this arterial phase study. Review of the MIP images confirms the above findings. NON-VASCULAR Hepatobiliary: No focal liver abnormality is seen. No gallstones, gallbladder wall thickening, or biliary dilatation. Pancreas: Unremarkable. No pancreatic ductal dilatation or surrounding inflammatory changes. Spleen: Normal in size without focal abnormality. Adrenals/Urinary Tract: Bilateral renal cysts are present. The largest is in the superior pole of the left kidney measuring 5.5 cm. There is no hydronephrosis or perinephric fluid. The adrenal glands are within normal limits. The bladder is distended, but otherwise within normal limits. Stomach/Bowel: Stomach is within normal limits. Appendix appears normal. No evidence of bowel wall thickening, distention, or inflammatory changes. Duodenal diverticulum present. There is diffuse colonic diverticulosis. Lymphatic: No enlarged lymph nodes are seen. Reproductive: Uterus and bilateral adnexa are unremarkable. Other: No abdominal wall hernia or abnormality. No abdominopelvic ascites. Musculoskeletal: There are degenerative changes of the spine. Review of the MIP images confirms the above findings. IMPRESSION: 1. No evidence for aortic dissection or aneurysm. 2. Findings compatible with pulmonary artery hypertension. 3. Findings compatible with interstitial lung disease with early fibrotic changes in the right lung base. Findings have minimally progressed compared to prior. 4. Stable mediastinal and hilar lymphadenopathy. 5. Stable 6 mm right lower lobe pulmonary nodule. Findings are favored as benign given stability since 2020. 6. Colonic diverticulosis. 7. Left Bosniak I benign renal cyst measuring 5.5 cm. No follow-up imaging is recommended. JACR 2018 Feb; 264-273, Management of the Incidental Renal Mass on CT, [employer] 2021; [id number], Bosniak Classification of Cystic  Renal Masses, Version 2019. Aortic Atherosclerosis (ICD10-I70.0). Electronically Signed   By: Darliss Cheney M.D.   On: 11/26/2021 18:57   DG Chest 2 View  Result Date: 11/26/2021 CLINICAL DATA:  Chest pain with shortness of breath EXAM: CHEST - 2 VIEW COMPARISON:  CT chest 04/11/2018 FINDINGS: There is some scattered areas of scarring bilaterally similar to prior. No focal lung consolidation, pleural effusion or pneumothorax. Cardiomediastinal silhouette is within normal limits. There surgical changes in both shoulders. IMPRESSION: No active cardiopulmonary disease. Scattered areas of scarring bilaterally similar to prior. Electronically Signed   By: Darliss Cheney M.D.   On: 11/26/2021 17:24     Data Reviewed: Relevant notes from primary care and specialist visits, past discharge summaries as available in EHR, including Care Everywhere. Prior diagnostic testing as pertinent to current admission diagnoses Updated medications and problem lists for reconciliation ED course, including vitals, labs, imaging, treatment and response to treatment Triage notes, nursing and pharmacy notes and ED provider's notes Notable results as noted in HPI   Assessment and Plan: * NSTEMI (non-ST elevated myocardial infarction) [facility]) Patient presents with typical pain,Troponin [phone] Continue heparin infusion Continue nitroglycerin infusion Daily aspirin, metoprolol N.p.o. from  midnight for possible procedure Cardiology consulted  Chronic hyponatremia Sodium at baseline at 130  Paroxysmal atrial tachycardia Continue verapamil  Idiopathic pulmonary fibrosis (HCC) Chronic respiratory failure with hypoxia Not acutely exacerbated Continue pirfenidone Albuterol as needed Continue supplemental oxygen Follows with pulmonology  Benign essential hypertension Patient is normotensive and is being placed on nitroglycerin infusion Will hold home antihypertensives of hydralazine, and Maxide, furosemide as  patient is on nitroglycerin Patient is on verapamil for MAT so no beta code started at this time        DVT prophylaxis: Heparin infusion  Consults: Cardiology consult, Dr. Juliann [place]  Advance Care Planning: Full code  Family Communication: Husband at bedside  Disposition Plan: Back to previous home environment  Severity of Illness: The appropriate patient status for this patient is INPATIENT. Inpatient status is judged to be reasonable and necessary in order to provide the required intensity of service to ensure the patient's safety. The patient's presenting symptoms, physical exam findings, and initial radiographic and laboratory data in the context of their chronic comorbidities is felt to place them at high risk for further clinical deterioration. Furthermore, it is not anticipated that the patient will be medically stable for discharge from the hospital within 2 midnights of admission.   * I certify that at the point of admission it is my clinical judgment that the patient will require inpatient hospital care spanning beyond 2 midnights from the point of admission due to high intensity of service, high risk for further deterioration and high frequency of surveillance required.*  Author: Andris BaumannHazel V Adea Geisel, MD 11/26/2021 10:22 PM  For on call review [email or web address].

## 2021-11-26 NOTE — ED Notes (Signed)
Purewick placed at this time.

## 2021-11-26 NOTE — Assessment & Plan Note (Signed)
Sodium at baseline at 130

## 2021-11-26 NOTE — Assessment & Plan Note (Signed)
Chronic respiratory failure with hypoxia Not acutely exacerbated Continue pirfenidone Albuterol as needed Continue supplemental oxygen Follows with pulmonology

## 2021-11-26 NOTE — ED Notes (Signed)
Patient to CT at this time

## 2021-11-26 NOTE — Progress Notes (Signed)
[place] for heparin infusion initiation and monitoring Indication: chest pain/ACS  Allergies  Allergen Reactions   Latex Itching and Rash   Lidocaine Swelling    Facial swelling from a dentist Facial swelling from a dentist Facial swelling from a dentist    Atorvastatin     Severe hip pain   Losartan Swelling   Metronidazole Other (See Comments)    Sore joints Sore joints    Oxycodone Nausea Only    Patient Measurements: Height: 5' (152.4 cm) Weight: 62.4 kg (137 lb 9.1 oz) IBW/kg (Calculated) : 45.5 Heparin Dosing Weight: 58.5 kg  Vital Signs: Temp: 97.7 F (36.5 C) (11/02 1636) Temp Source: Oral (11/02 1636) BP: 186/89 (11/02 1800) Pulse Rate: 72 (11/02 1800)  Labs: Recent Labs    11/26/21 1639  HGB 12.8  HCT 38.2  PLT 304  CREATININE 0.91  TROPONINIHS 2,557*    Estimated Creatinine Clearance: 38.7 mL/min (by C-G formula based on SCr of 0.91 mg/dL).   Medical History: Past Medical History:  Diagnosis Date   Arthritis    Heart murmur    Hyperlipidemia    Hypertension    Hyponatremia    IPF (idiopathic pulmonary fibrosis) (HCC)     Assessment: 81 y.o. female  w/ PMH of HTN, IPF w/ cc of chest pain and SOB. A review of medical records reveals no chronic anticoagulation prior to arrival.  Goal of Therapy:  Heparin level 0.3-0.7 units/ml Monitor platelets by anticoagulation protocol: Yes   Plan:  Give 3500 units bolus x 1 Start heparin infusion at 700 units/hr Check anti-Xa level in 8 hours and daily while on heparin Continue to monitor H&H and platelets  Dallie Piles 11/26/2021,7:05 PM

## 2021-11-26 NOTE — Assessment & Plan Note (Signed)
-   Continue verapamil 

## 2021-11-26 NOTE — ED Provider Notes (Signed)
[facility] Provider Note    Event Date/Time   First MD Initiated Contact with Patient 11/26/21 1754     (approximate)   History   Chest Pain   HPI  Susan Sosa is a 81 y.o. female with past medical history of hypertension, IPF, paroxysmal atrial tachycardia, here with chest pain and shortness of breath.  The patient states that after waking up from a nap today, she experienced acute, severe, substernal chest pressure with shortness of breath.  She states she has never had pain like this before.  She felt lightheaded.  She feels like she cannot catch her breath.  She does have a history of MAT but denies history of CAD.  She has a history of a murmur as well.  Denies recent medication changes other than recent changes to her blood pressure medication.  She states that over the last 6 to 7 months, she has had progressively worsening hypertension and has been taking multiple doses for this without significant improvement.  She has been following up with her PCP.     Physical Exam   Triage Vital Signs: ED Triage Vitals  Enc Vitals Group     BP 11/26/21 1636 (!) 195/86     Pulse Rate 11/26/21 1636 72     Resp 11/26/21 1636 20     Temp 11/26/21 1636 97.7 F (36.5 C)     Temp Source 11/26/21 1636 Oral     SpO2 11/26/21 1636 91 %     Weight 11/26/21 1637 137 lb 9.1 oz (62.4 kg)     Height 11/26/21 1637 5' (1.524 m)     Head Circumference --      Peak Flow --      Pain Score 11/26/21 1636 8     Pain Loc --      Pain Edu? --      Excl. in GC? --     Most recent vital signs: Vitals:   11/26/21 2247 11/26/21 2252  BP: 111/72 97/77  Pulse: 80 89  Resp: (!) 23 (!) 22  Temp:    SpO2: 96% 94%     General: Awake, no distress.  CV:  Good peripheral perfusion.  Regular rate and rhythm.  2+ radial and DP pulses appear symmetric bilaterally. Resp:  Normal effort.  Lungs clear to auscultation bilaterally. Abd:  No distention.  No tenderness. Other:  Trace  edema bilaterally in the lower extremities.   ED Results / Procedures / Treatments   Labs (all labs ordered are listed, but only abnormal results are displayed) Labs Reviewed  BASIC METABOLIC PANEL - Abnormal; Notable for the following components:      Result Value   Sodium 130 (*)    Chloride 92 (*)    Glucose, Bld 139 (*)    All other components within normal limits  TROPONIN I (HIGH SENSITIVITY) - Abnormal; Notable for the following components:   Troponin I (High Sensitivity) 2,557 (*)    All other components within normal limits  TROPONIN I (HIGH SENSITIVITY) - Abnormal; Notable for the following components:   Troponin I (High Sensitivity) 6,928 (*)    All other components within normal limits  SARS CORONAVIRUS 2 BY RT PCR  CBC  APTT  PROTIME-INR  HEPARIN LEVEL (UNFRACTIONATED)  LIPOPROTEIN A (LPA)  TROPONIN I (HIGH SENSITIVITY)     EKG Normal sinus rhythm, ventricular rate 75.  PR 192, QRS 80, QTc 417.  No acute ST elevations or depressions.  No acute evidence of acute ischemic infarct.  Nonspecific ST changes.  EKG 2: Normal sinus rhythm, VR 71. PR 219, QRS 89,. QTc 447. Subtle St changes remain in aVL, I, possible slight increased ST depression in III. No precordial changes  EKG 3: Normal sinus rhythm, VR 75. PR 201, QRS 90, QTc 442. ST changes remain in I, aVL but ST depression sin inferior leads seem to have improved.   RADIOLOGY Chest x-ray: No active disease   I also independently reviewed and agree with radiologist interpretations.   PROCEDURES:  Critical Care performed: Yes, see critical care procedure note(s)  .Critical Care  Performed by: Shaune Pollack, MD Authorized by: Shaune Pollack, MD   Critical care provider statement:    Critical care time (minutes):  30   Critical care time was exclusive of:  Separately billable procedures and treating other patients   Critical care was necessary to treat or prevent imminent or life-threatening  deterioration of the following conditions:  Cardiac failure, circulatory failure and respiratory failure   Critical care was time spent personally by me on the following activities:  Development of treatment plan with patient or surrogate, discussions with consultants, evaluation of patient's response to treatment, examination of patient, ordering and review of laboratory studies, ordering and review of radiographic studies, ordering and performing treatments and interventions, pulse oximetry, re-evaluation of patient's condition and review of old charts     MEDICATIONS ORDERED IN ED: Medications  nitroGLYCERIN (NITROSTAT) SL tablet 0.4 mg (0.4 mg Sublingual Given 11/26/21 2121)  heparin ADULT infusion 100 units/mL (25000 units/268mL) (700 Units/hr Intravenous New Bag/Given 11/26/21 1926)  fentaNYL (SUBLIMAZE) injection 50 mcg (0 mcg Intravenous Hold 11/26/21 2240)  ondansetron (ZOFRAN) injection 4 mg (0 mg Intravenous Hold 11/26/21 2240)  nitroGLYCERIN 50 mg in dextrose 5 % 250 mL (0.2 mg/mL) infusion (35 mcg/min Intravenous Rate/Dose Change 11/26/21 2248)  atorvastatin (LIPITOR) tablet 40 mg (has no administration in time range)  verapamil (CALAN-SR) CR tablet 180 mg (has no administration in time range)  pantoprazole (PROTONIX) EC tablet 40 mg (has no administration in time range)  magnesium oxide (MAG-OX) tablet 400 mg (has no administration in time range)  Pirfenidone CAPS 267 mg (has no administration in time range)  prednisoLONE acetate (PRED FORTE) 1 % ophthalmic suspension 1 drop (has no administration in time range)  aspirin EC tablet 81 mg (has no administration in time range)  acetaminophen (TYLENOL) tablet 650 mg (has no administration in time range)  ondansetron (ZOFRAN) injection 4 mg (has no administration in time range)  nitroGLYCERIN (NITROGLYN) 2 % ointment 1 inch (1 inch Topical Given 11/26/21 1822)  iohexol (OMNIPAQUE) 350 MG/ML injection 100 mL (100 mLs Intravenous Contrast  Given 11/26/21 1828)  aspirin chewable tablet 324 mg (324 mg Oral Given 11/26/21 1918)  heparin bolus via infusion 3,500 Units (3,500 Units Intravenous Bolus from Bag 11/26/21 1926)  ipratropium-albuterol (DUONEB) 0.5-2.5 (3) MG/3ML nebulizer solution 3 mL (3 mLs Nebulization Given 11/26/21 1954)     IMPRESSION / MDM / ASSESSMENT AND PLAN / ED COURSE  I reviewed the triage vital signs and the nursing notes.                               The patient is on the cardiac monitor to evaluate for evidence of arrhythmia and/or significant heart rate changes.   Ddx:  Differential includes the following, with pertinent life- or limb-threatening emergencies considered:  NSTEMI, STEMI,  Aortic Dissection, PE, HTN Emergency, PTX, PNA  Patient's presentation is most consistent with acute presentation with potential threat to life or bodily function.  MDM:  81 yo F with PMHx HTN, paroxysmal atach, IPF, here with chest pain. Pt arrives in mild distress 2/2 pain which improved dramatically with nitro. Initial EKG shows ST elevation in aVL but flattening withotu elevation in lead I, some mild reciprocal changes in III, aVF. Does NOT meet full STEMI criteria. Initial trop >2000. BMP with chronic hyponatremia. CBC without leukocytosis or anemia. CXR is clear. Given degree of pain and acuity of onset, CT angio obtained in setting of her worsening HTN and fortunately shows no evidence of dissection or other acute abnormality. Will start on heparin gtt, ASA given, pain improved with nitro ointment and Sl x 1.  Called and discussed initial EKG and trop with Dr. Clayborn Bigness as pt goes to [facility]. He agrees with management at this time, recommends also discussing with on-call Stemi MD at [facility] given concerning history and EKG changes.   Discussed with Dr. Ellyn Hack, agrees pt does not meet STEMI criteria at this time, though changes do appear somewhat concerning. Given pt's near complete resolution of pain, he  recommends continued medical management with admission. Nitro gtt started.  MEDICATIONS GIVEN IN ED: Medications  nitroGLYCERIN (NITROSTAT) SL tablet 0.4 mg (0.4 mg Sublingual Given 11/26/21 2121)  heparin ADULT infusion 100 units/mL (25000 units/287mL) (700 Units/hr Intravenous New Bag/Given 11/26/21 1926)  fentaNYL (SUBLIMAZE) injection 50 mcg (0 mcg Intravenous Hold 11/26/21 2240)  ondansetron (ZOFRAN) injection 4 mg (0 mg Intravenous Hold 11/26/21 2240)  nitroGLYCERIN 50 mg in dextrose 5 % 250 mL (0.2 mg/mL) infusion (35 mcg/min Intravenous Rate/Dose Change 11/26/21 2248)  atorvastatin (LIPITOR) tablet 40 mg (has no administration in time range)  verapamil (CALAN-SR) CR tablet 180 mg (has no administration in time range)  pantoprazole (PROTONIX) EC tablet 40 mg (has no administration in time range)  magnesium oxide (MAG-OX) tablet 400 mg (has no administration in time range)  Pirfenidone CAPS 267 mg (has no administration in time range)  prednisoLONE acetate (PRED FORTE) 1 % ophthalmic suspension 1 drop (has no administration in time range)  aspirin EC tablet 81 mg (has no administration in time range)  acetaminophen (TYLENOL) tablet 650 mg (has no administration in time range)  ondansetron (ZOFRAN) injection 4 mg (has no administration in time range)  nitroGLYCERIN (NITROGLYN) 2 % ointment 1 inch (1 inch Topical Given 11/26/21 1822)  iohexol (OMNIPAQUE) 350 MG/ML injection 100 mL (100 mLs Intravenous Contrast Given 11/26/21 1828)  aspirin chewable tablet 324 mg (324 mg Oral Given 11/26/21 1918)  heparin bolus via infusion 3,500 Units (3,500 Units Intravenous Bolus from Bag 11/26/21 1926)  ipratropium-albuterol (DUONEB) 0.5-2.5 (3) MG/3ML nebulizer solution 3 mL (3 mLs Nebulization Given 11/26/21 1954)     Consults:  Dr. Ellyn Hack with STEMI team Dr. Clayborn Bigness with [facility] Hospitalist    EMR reviewed  Prior visits with [facility] and recent visit with [facility]     FINAL CLINICAL  IMPRESSION(S) / ED DIAGNOSES   Final diagnoses:  NSTEMI (non-ST elevated myocardial infarction) [facility])     Rx / DC Orders   ED Discharge Orders     None        Note:  This document was prepared using Dragon voice recognition software and may include unintentional dictation errors.   Duffy Bruce, MD 11/26/21 [phone]

## 2021-11-26 NOTE — Hospital Course (Signed)
HTN, paroxysmal atrial tachycardia, chronic hyponatremia, interstitial pulmonary fibrosis with chronic respiratory failure on home O2 at 2 L who presents to the ED with chest pain.  Patient was awoken following a nap with severe retrosternal nonradiating chest tightness, associated with nausea, palpitations and lightheadedness and shortness of breath.  She has a chronic cough related to her interstitial pulmonary fibrosis.  She denies fever and chills.  She denies lower extremity pain or swelling.  Patient had an episode of nonbloody nonbilious vomiting on her way to the hospital. ED course and data review: BP 195/86 with otherwise unremarkable vitals.  Troponin [id number].  Sodium at baseline at 130.  COVID-negative.  EKG #2, personally viewed and interpreted showed NSR at 75 with no acute ST-T wave changes. CT angio chest negative for PE and showing the following findings: IMPRESSION: 1. No evidence for aortic dissection or aneurysm. 2. Findings compatible with pulmonary artery hypertension. 3. Findings compatible with interstitial lung disease with early fibrotic changes in the right lung base. Findings have minimally progressed compared to prior. 4. Stable mediastinal and hilar lymphadenopathy. 5. Stable 6 mm right lower lobe pulmonary nodule. Findings are favored as benign given stability since 2020. 6. Colonic diverticulosis. 7. Left Bosniak I benign renal cyst measuring 5.5 cm. No follow-up imaging is recommended.  Patient's initial EKG had shown questionable changes in anterior leads and cardiologist, Dr. Clayborn Bigness was consulted from the ED and recommended running the EKG by on-call STEMI, Dr. Myrene Galas who subsequently decided that EKG did not meet STEMI criteria Patient was given chewable aspirin and started on heparin infusion.  Nitroglycerin infusion was started due to residual mild chest pain.  Hospitalist consulted for admission.

## 2021-11-27 ENCOUNTER — Encounter: Payer: Self-pay | Admitting: Internal Medicine

## 2021-11-27 ENCOUNTER — Other Ambulatory Visit: Payer: Self-pay

## 2021-11-27 ENCOUNTER — Encounter
Admission: EM | Disposition: A | Discharge status: Home / Self Care | Payer: Self-pay | Source: Home / Self Care | Attending: Internal Medicine

## 2021-11-27 DIAGNOSIS — I214 Non-ST elevation (NSTEMI) myocardial infarction: Principal | ICD-10-CM

## 2021-11-27 HISTORY — PX: LEFT HEART CATH AND CORONARY ANGIOGRAPHY: CATH118249

## 2021-11-27 LAB — TROPONIN I (HIGH SENSITIVITY)
Troponin I (High Sensitivity): 6616 ng/L (ref <18)
Troponin I (High Sensitivity): 5472 ng/L (ref <18)

## 2021-11-27 LAB — HEPARIN LEVEL (UNFRACTIONATED): Heparin Unfractionated: 0.14 IU/mL — ABNORMAL LOW (ref 0.30–0.70)

## 2021-11-27 SURGERY — LEFT HEART CATH AND CORONARY ANGIOGRAPHY
Anesthesia: Moderate Sedation

## 2021-11-27 MED ORDER — FENTANYL CITRATE (PF) 100 MCG/2ML IJ SOLN
INTRAMUSCULAR | Status: DC | PRN
  Administered 2021-11-27: 12.5 ug via INTRAVENOUS

## 2021-11-27 MED ORDER — ONDANSETRON HCL 4 MG/2ML IJ SOLN: 4.0000 mg | Freq: Four times a day (QID) | INTRAMUSCULAR | Status: DC | PRN

## 2021-11-27 MED ORDER — BUPIVACAINE HCL (PF) 0.5 % IJ SOLN
INTRAMUSCULAR | Status: AC
  Filled 2021-11-27: qty 30

## 2021-11-27 MED ORDER — SODIUM CHLORIDE 0.9 % WEIGHT BASED INFUSION
3.0000 mL/kg/h | INTRAVENOUS | Status: DC
  Administered 2021-11-27: 3 mL/kg/h via INTRAVENOUS

## 2021-11-27 MED ORDER — SODIUM CHLORIDE 0.9 % WEIGHT BASED INFUSION: 3.0000 mL/kg/h | INTRAVENOUS | Status: DC

## 2021-11-27 MED ORDER — BUPIVACAINE HCL (PF) 0.5 % IJ SOLN
INTRAMUSCULAR | Status: DC | PRN
  Administered 2021-11-27: 2 mL via PERCUTANEOUS

## 2021-11-27 MED ORDER — SODIUM CHLORIDE 0.9 % WEIGHT BASED INFUSION: 1.0000 mL/kg/h | INTRAVENOUS | Status: DC

## 2021-11-27 MED ORDER — HEPARIN SODIUM (PORCINE) 1000 UNIT/ML IJ SOLN
INTRAMUSCULAR | Status: AC
  Filled 2021-11-27: qty 10

## 2021-11-27 MED ORDER — HEPARIN (PORCINE) IN NACL 1000-0.9 UT/500ML-% IV SOLN
INTRAVENOUS | Status: AC
  Filled 2021-11-27: qty 1000

## 2021-11-27 MED ORDER — HEPARIN SODIUM (PORCINE) 1000 UNIT/ML IJ SOLN
INTRAMUSCULAR | Status: DC | PRN
  Administered 2021-11-27: 3000 [IU] via INTRAVENOUS

## 2021-11-27 MED ORDER — SODIUM CHLORIDE 0.9 % WEIGHT BASED INFUSION
1.0000 mL/kg/h | INTRAVENOUS | Status: DC
  Administered 2021-11-27: 1 mL/kg/h via INTRAVENOUS

## 2021-11-27 MED ORDER — ASPIRIN 81 MG PO CHEW: 81.0000 mg | CHEWABLE_TABLET | Freq: Every day | ORAL | Status: DC

## 2021-11-27 MED ORDER — ACETAMINOPHEN 325 MG PO TABS: 650.0000 mg | ORAL_TABLET | ORAL | Status: DC | PRN

## 2021-11-27 MED ORDER — SODIUM CHLORIDE 0.9% FLUSH: 3.0000 mL | Freq: Two times a day (BID) | INTRAVENOUS | Status: DC

## 2021-11-27 MED ORDER — LABETALOL HCL 5 MG/ML IV SOLN: 10.0000 mg | INTRAVENOUS | Status: AC | PRN

## 2021-11-27 MED ORDER — SODIUM CHLORIDE 0.9 % IV SOLN: 250.0000 mL | INTRAVENOUS | Status: DC | PRN

## 2021-11-27 MED ORDER — VERAPAMIL HCL 2.5 MG/ML IV SOLN
INTRAVENOUS | Status: AC
  Filled 2021-11-27: qty 2

## 2021-11-27 MED ORDER — HEPARIN BOLUS VIA INFUSION
1800.0000 [IU] | Freq: Once | INTRAVENOUS | Status: AC
  Administered 2021-11-27: 1800 [IU] via INTRAVENOUS
  Filled 2021-11-27: qty 1800

## 2021-11-27 MED ORDER — ASPIRIN 81 MG PO CHEW: 81.0000 mg | CHEWABLE_TABLET | ORAL | Status: DC

## 2021-11-27 MED ORDER — IOHEXOL 300 MG/ML  SOLN
INTRAMUSCULAR | Status: DC | PRN
  Administered 2021-11-27: 76 mL

## 2021-11-27 MED ORDER — SODIUM CHLORIDE 0.9% FLUSH: 3.0000 mL | INTRAVENOUS | Status: DC | PRN

## 2021-11-27 MED ORDER — HYDRALAZINE HCL 20 MG/ML IJ SOLN: 10.0000 mg | INTRAMUSCULAR | Status: AC | PRN

## 2021-11-27 MED ORDER — VERAPAMIL HCL 2.5 MG/ML IV SOLN
INTRAVENOUS | Status: DC | PRN
  Administered 2021-11-27: 2.5 mg via INTRA_ARTERIAL

## 2021-11-27 MED ORDER — HEPARIN (PORCINE) IN NACL 1000-0.9 UT/500ML-% IV SOLN
INTRAVENOUS | Status: DC | PRN
  Administered 2021-11-27: 1000 mL

## 2021-11-27 MED ORDER — CLOPIDOGREL BISULFATE 75 MG PO TABS
75.0000 mg | ORAL_TABLET | Freq: Every day | ORAL | Status: DC
  Administered 2021-11-28: 75 mg via ORAL
  Filled 2021-11-27: qty 1

## 2021-11-27 MED ORDER — FENTANYL CITRATE (PF) 100 MCG/2ML IJ SOLN
INTRAMUSCULAR | Status: AC
  Filled 2021-11-27: qty 2

## 2021-11-27 MED ORDER — MIDAZOLAM HCL 2 MG/2ML IJ SOLN
INTRAMUSCULAR | Status: DC | PRN
  Administered 2021-11-27: .5 mg via INTRAVENOUS

## 2021-11-27 MED ORDER — MIDAZOLAM HCL 2 MG/2ML IJ SOLN
INTRAMUSCULAR | Status: AC
  Filled 2021-11-27: qty 2

## 2021-11-27 SURGICAL SUPPLY — 10 items
BAND ZEPHYR COMPRESS 30 LONG (HEMOSTASIS) IMPLANT
CATH 5FR JL3.5 JR4 ANG PIG MP (CATHETERS) IMPLANT
DRAPE BRACHIAL (DRAPES) IMPLANT
GLIDESHEATH SLEND SS 6F .021 (SHEATH) IMPLANT
GUIDEWIRE INQWIRE 1.5J.035X260 (WIRE) IMPLANT
INQWIRE 1.5J .035X260CM (WIRE) ×1
PACK CARDIAC CATH (CUSTOM PROCEDURE TRAY) ×1 IMPLANT
PROTECTION STATION PRESSURIZED (MISCELLANEOUS) ×1
SET ATX SIMPLICITY (MISCELLANEOUS) IMPLANT
STATION PROTECTION PRESSURIZED (MISCELLANEOUS) IMPLANT

## 2021-11-27 NOTE — Consult Note (Signed)
CARDIOLOGY CONSULT NOTE               Patient ID: Susan Sosa MRN: [id number] DOB/AGE: October 24, 1940 81 y.o.  Admit date: 11/26/2021 Referring Physician Dr. Judd Gaudier hospitalist Primary Physician Dr. Netty Starring primary Primary Cardiologist Dr. Saralyn Pilar Reason for Consultation non-STEMI  HPI: 81 year old female history of idiopathic pulmonary fibrosis hypoxemia paroxysmal atrial tachycardia chronic hyponatremia chronic respiratory failure on 2 L home O2 for hypoxemia patient with persistent elevated blood pressure some headaches was brought to the emergency room was found to be having shortness of breath chest discomfort as well EKG with borderline patient was placed on heparin aspirin and blood pressure was aggressively controlled but troponins returned elevated above 2000 so cardiology was then contacted for further evaluation and management.  Patient is currently pain-free but still short of breath which is chronic no block as well as a syncope  Review of systems complete and found to be negative unless listed above     Past Medical History:  Diagnosis Date   Arthritis    Heart murmur    Hyperlipidemia    Hypertension    Hyponatremia    IPF (idiopathic pulmonary fibrosis) ([place])     History reviewed. No pertinent surgical history.  (Not in a hospital admission)  Social History   Socioeconomic History   Marital status: Married    Spouse name: Not on file   Number of children: Not on file   Years of education: Not on file   Highest education level: Not on file  Occupational History   Not on file  Tobacco Use   Smoking status: Former   Smokeless tobacco: Never  Substance and Sexual Activity   Alcohol use: Never   Drug use: Never   Sexual activity: Not on file  Other Topics Concern   Not on file  Social History Narrative   ** Merged History Encounter **       Social Determinants of Health   Financial Resource Strain: Not on file  Food Insecurity: Not on  file  Transportation Needs: Not on file  Physical Activity: Not on file  Stress: Not on file  Social Connections: Not on file  Intimate Partner Violence: Not on file    No family history on file.    Review of systems complete and found to be negative unless listed above      PHYSICAL EXAM  General: Well developed, well nourished, in no acute distress HEENT:  Normocephalic and atramatic Neck:  No JVD.  Lungs: Diffuse rhonchi bilaterally to auscultation and percussion. Heart: HRRR . Normal S1 and S2 without gallops or murmurs.  Abdomen: Bowel sounds are positive, abdomen soft and non-tender  Msk:  Back normal, normal gait. Normal strength and tone for age. Extremities: No clubbing, cyanosis or edema.   Neuro: Alert and oriented X 3. Psych:  Good affect, responds appropriately  Labs:   Lab Results  Component Value Date   WBC 8.5 11/26/2021   HGB 12.8 11/26/2021   HCT 38.2 11/26/2021   MCV 85.5 11/26/2021   PLT 304 11/26/2021    Recent Labs  Lab 11/26/21 1639  NA 130*  K 3.7  CL 92*  CO2 27  BUN 20  CREATININE 0.91  CALCIUM 9.3  GLUCOSE 139*   No results found for: "CKTOTAL", "CKMB", "CKMBINDEX", "TROPONINI" No results found for: "CHOL" No results found for: "HDL" No results found for: "LDLCALC" No results found for: "TRIG" No results found for: "CHOLHDL" No results found for: "  LDLDIRECT"    Radiology: CT Angio Chest/Abd/Pel for Dissection W and/or Wo Contrast  Result Date: 11/26/2021 CLINICAL DATA:  Chest and back pain, emesis. EXAM: CT ANGIOGRAPHY CHEST, ABDOMEN AND PELVIS TECHNIQUE: Non-contrast CT of the chest was initially obtained. Multidetector CT imaging through the chest, abdomen and pelvis was performed using the standard protocol during bolus administration of intravenous contrast. Multiplanar reconstructed images and MIPs were obtained and reviewed to evaluate the vascular anatomy. RADIATION DOSE REDUCTION: This exam was performed according to the  departmental dose-optimization program which includes automated exposure control, adjustment of the mA and/or kV according to patient size and/or use of iterative reconstruction technique. CONTRAST:  OMNIPAQUE IOHEXOL 350 MG/ML SOLN COMPARISON:  CT chest 04/11/2018 FINDINGS: CTA CHEST FINDINGS Cardiovascular: Preferential opacification of the thoracic aorta. No evidence of thoracic aortic aneurysm or dissection. Normal heart size. No pericardial effusion. There are atherosclerotic calcifications of the aorta. Main pulmonary artery is enlarged compatible with pulmonary artery hypertension. No large central pulmonary embolism. Mediastinum/Nodes: There is a small hiatal hernia. Esophagus is within normal limits. There is a hypodense right thyroid nodule measuring 7 mm which is unchanged from prior. There is an enlarged precarinal lymph node measuring 15 mm short axis, unchanged. There are prominent bilateral hilar lymph nodes. Lungs/Pleura: There is a stable 6 mm fissural nodule in the right lower lobe image 5/77. Again seen are peripheral ground-glass and subpleural reticulations. There some bronchiectasis in the right lower lobe, unchanged. Early fibrotic changes are seen in the right lung base. Findings have minimally progressed compared to prior. No focal lung consolidation, pleural effusion or pneumothorax. Musculoskeletal: No chest wall abnormality. No acute or significant osseous findings. Review of the MIP images confirms the above findings. CTA ABDOMEN AND PELVIS FINDINGS VASCULAR Aorta: Normal caliber aorta without aneurysm, dissection, vasculitis or significant stenosis. There are atherosclerotic calcifications of the aorta. Celiac: Patent without evidence of aneurysm, dissection, vasculitis or significant stenosis. SMA: Patent without evidence of aneurysm, dissection, vasculitis or significant stenosis. Renals: Both renal arteries are patent without evidence of aneurysm, dissection, vasculitis,  fibromuscular dysplasia or significant stenosis. IMA: Patent without evidence of aneurysm, dissection, vasculitis or significant stenosis. Inflow: Patent without evidence of aneurysm, dissection, vasculitis or significant stenosis. Veins: No obvious venous abnormality within the limitations of this arterial phase study. Review of the MIP images confirms the above findings. NON-VASCULAR Hepatobiliary: No focal liver abnormality is seen. No gallstones, gallbladder wall thickening, or biliary dilatation. Pancreas: Unremarkable. No pancreatic ductal dilatation or surrounding inflammatory changes. Spleen: Normal in size without focal abnormality. Adrenals/Urinary Tract: Bilateral renal cysts are present. The largest is in the superior pole of the left kidney measuring 5.5 cm. There is no hydronephrosis or perinephric fluid. The adrenal glands are within normal limits. The bladder is distended, but otherwise within normal limits. Stomach/Bowel: Stomach is within normal limits. Appendix appears normal. No evidence of bowel wall thickening, distention, or inflammatory changes. Duodenal diverticulum present. There is diffuse colonic diverticulosis. Lymphatic: No enlarged lymph nodes are seen. Reproductive: Uterus and bilateral adnexa are unremarkable. Other: No abdominal wall hernia or abnormality. No abdominopelvic ascites. Musculoskeletal: There are degenerative changes of the spine. Review of the MIP images confirms the above findings. IMPRESSION: 1. No evidence for aortic dissection or aneurysm. 2. Findings compatible with pulmonary artery hypertension. 3. Findings compatible with interstitial lung disease with early fibrotic changes in the right lung base. Findings have minimally progressed compared to prior. 4. Stable mediastinal and hilar lymphadenopathy. 5. Stable 6 mm right  lower lobe pulmonary nodule. Findings are favored as benign given stability since 2020. 6. Colonic diverticulosis. 7. Left Bosniak I benign  renal cyst measuring 5.5 cm. No follow-up imaging is recommended. JACR 2018 Feb; 264-273, Management of the Incidental Renal Mass on CT, [employer] 2021; [id number], Bosniak Classification of Cystic Renal Masses, Version 2019. Aortic Atherosclerosis (ICD10-I70.0). Electronically Signed   By: Darliss Cheney M.D.   On: 11/26/2021 18:57   DG Chest 2 View  Result Date: 11/26/2021 CLINICAL DATA:  Chest pain with shortness of breath EXAM: CHEST - 2 VIEW COMPARISON:  CT chest 04/11/2018 FINDINGS: There is some scattered areas of scarring bilaterally similar to prior. No focal lung consolidation, pleural effusion or pneumothorax. Cardiomediastinal silhouette is within normal limits. There surgical changes in both shoulders. IMPRESSION: No active cardiopulmonary disease. Scattered areas of scarring bilaterally similar to prior. Electronically Signed   By: Darliss Cheney M.D.   On: 11/26/2021 17:24    EKG: Normal sinus rhythm diffuse nonspecific ST-T wave changes evidence of some ischemic changes rate of about 80  ASSESSMENT AND PLAN:  Non-STEMI Elevated troponins Paroxysmal atrial tachycardia Idiopathic pulmonary fibrosis Chronic respiratory failure with hypoxemia Hypertension Shortness of breath Abnormal EKG  Plan Continue admit rule out myocardial infarction follow-up troponins EKGs Continue anticoagulation with heparin for probable non-STEMI Echocardiogram for evaluation non-STEMI Continue verapamil for paroxysmal atrial tachycardia Continue rate control anticoagulation Recommend continue blood pressure management and control Consider cardiac catheterization prior to discharge  Signed: Alwyn Pea MD 11/27/2021, 9:33 AM

## 2021-11-27 NOTE — Hospital Course (Addendum)
Taken from H&P.  Susan Sosa is a 81 y.o. female with medical history significant for HTN, paroxysmal atrial tachycardia, chronic hyponatremia, interstitial pulmonary fibrosis with chronic respiratory failure on home O2 at 2 L who presents to the ED with chest pain.  Patient was awoken following a nap with severe retrosternal nonradiating chest tightness, associated with nausea, palpitations and lightheadedness and shortness of breath.  She has a chronic cough related to her interstitial pulmonary fibrosis.  She denies fever and chills.  She denies lower extremity pain or swelling.  Patient had an episode of nonbloody nonbilious vomiting on her way to the hospital. ED course and data review: BP 195/86 with otherwise unremarkable vitals.  Troponin [id number].  Sodium at baseline at 130.  COVID-negative.   EKG #2, personally viewed and interpreted showed NSR at 75 with no acute ST-T wave changes. CT angio chest negative for PE and showing the following findings: IMPRESSION: 1. No evidence for aortic dissection or aneurysm. 2. Findings compatible with pulmonary artery hypertension. 3. Findings compatible with interstitial lung disease with early fibrotic changes in the right lung base. Findings have minimally progressed compared to prior. 4. Stable mediastinal and hilar lymphadenopathy. 5. Stable 6 mm right lower lobe pulmonary nodule. Findings are favored as benign given stability since 2020. 6. Colonic diverticulosis. 7. Left Bosniak I benign renal cyst measuring 5.5 cm. No follow-up imaging is recommended.   Patient's initial EKG had shown questionable changes in anterior leads and cardiologist, Dr. Clayborn Bigness was consulted from the ED and recommended running the EKG by on-call STEMI, Dr. Myrene Galas who subsequently decided that EKG did not meet STEMI criteria Patient was given chewable aspirin and started on heparin infusion.  Nitroglycerin infusion was started due to residual mild chest pain.    11/3: Hemodynamically stable, saturating 95% on 4 L.  Troponin peaked at 7967 and now trending down, most recent at 5472.  Cardiology is planning to take her to Cath Lab.  11/4: Hemodynamically stable.  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  Apparently patient was unable to tolerate ACE ARB or  ARN I in the past.  Labs remained stable.  Saturating well on baseline oxygen requirement. Cardiology added Plavix.  No more chest pain.  Patient wants to go home. Patient is on multiple antihypertensives at home, holding Maxide until she sees her cardiologist and they can restart if needed.  Blood pressure well controlled without Maxzide , Lasix and clonidine in the hospital.  Patient will continue the rest of her home medications and need to have a close follow-up with her providers for further recommendations.

## 2021-11-27 NOTE — CV Procedure (Signed)
Brief cardiac cath note  Impression Non-STEMI elevated troponins Chest pain shortness of breath Hypertension Idiopathic pulmonary fibrosis  Brought to the cardiac Cath Lab diagnostic cardiac cath right radial approach LV function moderately diminished with anterior apical hypokinesis EF of around 30 to 35%  Coronaries Left main large free of disease LAD large free of disease Circumflex large free of disease RCA very large free of disease  No significant obstructive disease Possible Takotsubo Recommend conservative medical therapy Consider short-term course of Plavix 3 to 6 months Aspirin 81 mg daily Consider early discharge Hydralazine and Imdur since patient unable to tolerate ACE ARB or Arni Low-dose diuretics as necessary Follow-up with cardiology as an outpatient

## 2021-11-27 NOTE — ED Notes (Signed)
Patient transported to cath lab

## 2021-11-27 NOTE — Progress Notes (Signed)
Pt eating and drinking with family at bedside

## 2021-11-27 NOTE — Progress Notes (Signed)
[place] for heparin infusion initiation and monitoring Indication: chest pain/ACS  Allergies  Allergen Reactions   Latex Itching and Rash   Lidocaine Swelling    Facial swelling from a dentist Facial swelling from a dentist Facial swelling from a dentist    Atorvastatin     Severe hip pain   Losartan Swelling   Metronidazole Other (See Comments)    Sore joints Sore joints    Oxycodone Nausea Only    Patient Measurements: Height: 5' (152.4 cm) Weight: 62.4 kg (137 lb 9.1 oz) IBW/kg (Calculated) : 45.5 Heparin Dosing Weight: 58.5 kg  Vital Signs: Temp: 97.7 F (36.5 C) (11/03 0429) Temp Source: Oral (11/03 0429) BP: 116/65 (11/03 0500) Pulse Rate: 76 (11/03 0500)  Labs: Recent Labs    11/26/21 1639 11/26/21 1846 11/26/21 2229 11/27/21 0142 11/27/21 0506  HGB 12.8  --   --   --   --   HCT 38.2  --   --   --   --   PLT 304  --   --   --   --   APTT 32  --   --   --   --   LABPROT 13.3  --   --   --   --   INR 1.0  --   --   --   --   HEPARINUNFRC  --   --   --   --  0.14*  CREATININE 0.91  --   --   --   --   TROPONINIHS 2,557* 6,928* 7,167* 6,616*  --      Estimated Creatinine Clearance: 38.7 mL/min (by C-G formula based on SCr of 0.91 mg/dL).   Medical History: Past Medical History:  Diagnosis Date   Arthritis    Heart murmur    Hyperlipidemia    Hypertension    Hyponatremia    IPF (idiopathic pulmonary fibrosis) (HCC)     Assessment: 81 y.o. female  w/ PMH of HTN, IPF w/ cc of chest pain and SOB. A review of medical records reveals no chronic anticoagulation prior to arrival.  Goal of Therapy:  Heparin level 0.3-0.7 units/ml Monitor platelets by anticoagulation protocol: Yes   11/03 0506 HL 0.14, subtherapeutic  Plan:  Bolus 1800 units x 1 Increase heparin infusion to 900 units/hr Will recheck HL in 8 hr after rate change CBC daily while on heparin  Renda Rolls, PharmD, [facility] 11/27/2021 5:56  AM

## 2021-11-27 NOTE — ED Notes (Signed)
Heparin rate change and bolus verified with Judson Roch, RN.

## 2021-11-27 NOTE — Assessment & Plan Note (Signed)
-  See above under pulmonary fibrosis -Stable on baseline oxygen requirement

## 2021-11-27 NOTE — Progress Notes (Signed)
Progress Note   Patient: Susan Sosa GHW:[id number] DOB: Jan 27, 1940 DOA: 11/26/2021     1 DOS: the patient was seen and examined on 11/27/2021   Brief hospital course: Taken from H&P.  Shauntay Brunelli is a 81 y.o. female with medical history significant for HTN, paroxysmal atrial tachycardia, chronic hyponatremia, interstitial pulmonary fibrosis with chronic respiratory failure on home O2 at 2 L who presents to the ED with chest pain.  Patient was awoken following a nap with severe retrosternal nonradiating chest tightness, associated with nausea, palpitations and lightheadedness and shortness of breath.  She has a chronic cough related to her interstitial pulmonary fibrosis.  She denies fever and chills.  She denies lower extremity pain or swelling.  Patient had an episode of nonbloody nonbilious vomiting on her way to the hospital. ED course and data review: BP 195/86 with otherwise unremarkable vitals.  Troponin [id number].  Sodium at baseline at 130.  COVID-negative.   EKG #2, personally viewed and interpreted showed NSR at 75 with no acute ST-T wave changes. CT angio chest negative for PE and showing the following findings: IMPRESSION: 1. No evidence for aortic dissection or aneurysm. 2. Findings compatible with pulmonary artery hypertension. 3. Findings compatible with interstitial lung disease with early fibrotic changes in the right lung base. Findings have minimally progressed compared to prior. 4. Stable mediastinal and hilar lymphadenopathy. 5. Stable 6 mm right lower lobe pulmonary nodule. Findings are favored as benign given stability since 2020. 6. Colonic diverticulosis. 7. Left Bosniak I benign renal cyst measuring 5.5 cm. No follow-up imaging is recommended.   Patient's initial EKG had shown questionable changes in anterior leads and cardiologist, Dr. Clayborn Bigness was consulted from the ED and recommended running the EKG by on-call STEMI, Dr. Myrene Galas who subsequently decided that EKG  did not meet STEMI criteria Patient was given chewable aspirin and started on heparin infusion.  Nitroglycerin infusion was started due to residual mild chest pain.   11/3: Hemodynamically stable, saturating 95% on 4 L.  Troponin peaked at 7967 and now trending down, most recent at 5472.  Cardiology is planning to take her to Cath Lab.  Assessment and Plan: * NSTEMI (non-ST elevated myocardial infarction) [facility]) Patient presents with typical pain,Troponin [phone] He was on heparin infusion and cardiology was consulted Plan was to take him to cardiac cath later today -Follow-up post cath recommendations -Continue aspirin, and statin  Benign essential hypertension Patient is normotensive and is being placed on nitroglycerin infusion -Continue holding home antihypertensives of hydralazine, and Maxide, furosemide as patient is on nitroglycerin Patient is on verapamil for MAT so no beta code started at this time  Idiopathic pulmonary fibrosis (HCC) Chronic respiratory failure with hypoxia Not acutely exacerbated Continue pirfenidone Albuterol as needed Continue supplemental oxygen Follows with pulmonology  Paroxysmal atrial tachycardia Continue verapamil  Chronic respiratory failure with hypoxia ([place]) -See above under pulmonary fibrosis -Stable on baseline oxygen requirement  Chronic hyponatremia Sodium at baseline at 130    Subjective: Patient was seen and examined today.  She continues to have some chest heaviness.  Waiting for cardiac catheterization.  Husband at bedside  Physical Exam: Vitals:   11/27/21 1315 11/27/21 1330 11/27/21 1345 11/27/21 1400  BP: (!) 140/70 121/66 122/67 110/62  Pulse: 90 89 78 80  Resp: (!) 22 (!) 22 (!) 21 (!) 24  Temp:      TempSrc:      SpO2: 96% 97% 93% 93%  Weight:      Height:  General.  Frail elderly lady, in no acute distress. Pulmonary.  Lungs clear bilaterally, normal respiratory effort. CV.  Regular rate and rhythm,  no JVD, rub or murmur. Abdomen.  Soft, nontender, nondistended, BS positive. CNS.  Alert and oriented .  No focal neurologic deficit. Extremities.  No edema, no cyanosis, pulses intact and symmetrical. Psychiatry.  Judgment and insight appears normal.   Data Reviewed: Prior data reviewed  Family Communication: Discussed with husband at bedside  Disposition: Status is: Inpatient Remains inpatient appropriate because: Severity of illness  Planned Discharge Destination: Home  DVT prophylaxis.  Heparin Time spent: 50 minutes  This record has been created using Conservation officer, historic buildings. Errors have been sought and corrected,but may not always be located. Such creation errors do not reflect on the standard of care.   Author: Arnetha Courser, MD 11/27/2021 3:38 PM  For on call review [email or web address].

## 2021-11-28 ENCOUNTER — Other Ambulatory Visit: Payer: Self-pay

## 2021-11-28 DIAGNOSIS — I214 Non-ST elevation (NSTEMI) myocardial infarction: Secondary | ICD-10-CM | POA: Diagnosis not present

## 2021-11-28 LAB — BASIC METABOLIC PANEL
Anion gap: 9 (ref 5–15)
BUN: 19 mg/dL (ref 8–23)
CO2: 26 mmol/L (ref 22–32)
Calcium: 8.8 mg/dL — ABNORMAL LOW (ref 8.9–10.3)
Chloride: 98 mmol/L (ref 98–111)
Creatinine, Ser: 0.71 mg/dL (ref 0.44–1.00)
GFR, Estimated: >60 mL/min (ref >60)
Glucose, Bld: 111 mg/dL — ABNORMAL HIGH (ref 70–99)
Potassium: 3.7 mmol/L (ref 3.5–5.1)
Sodium: 133 mmol/L — ABNORMAL LOW (ref 135–145)

## 2021-11-28 LAB — CBC
HCT: 37 % (ref 36.0–46.0)
Hemoglobin: 12.9 g/dL (ref 12.0–15.0)
MCH: 29.9 pg (ref 26.0–34.0)
MCHC: 34.9 g/dL (ref 30.0–36.0)
MCV: 85.6 fL (ref 80.0–100.0)
Platelets: 268 10*3/uL (ref 150–400)
RBC: 4.32 MIL/uL (ref 3.87–5.11)
RDW: 12.9 % (ref 11.5–15.5)
WBC: 8.7 10*3/uL (ref 4.0–10.5)
nRBC: 0 % (ref 0.0–0.2)

## 2021-11-28 LAB — LIPOPROTEIN A (LPA): Lipoprotein (a): 181.6 nmol/L — ABNORMAL HIGH (ref <75.0)

## 2021-11-28 MED ORDER — NITROGLYCERIN 0.4 MG SL SUBL: 0.4000 mg | SUBLINGUAL_TABLET | SUBLINGUAL | 12 refills | Status: AC | PRN

## 2021-11-28 MED ORDER — CLOPIDOGREL BISULFATE 75 MG PO TABS: 75.0000 mg | ORAL_TABLET | Freq: Every day | ORAL | 0 refills | Status: DC

## 2021-11-28 MED ORDER — TRIAMTERENE-HCTZ 75-50 MG PO TABS: 1.0000 | ORAL_TABLET | Freq: Every day | ORAL | Status: DC

## 2021-11-28 NOTE — Progress Notes (Signed)
[facility] Cardiology    SUBJECTIVE: Patient feels reasonably well on 2 L O2 with no chest pain or shortness of breath ambulating in the room feels comfortable and well enough to be discharged home   Vitals:   11/27/21 2103 11/27/21 2344 11/28/21 0430 11/28/21 0746  BP: (!) 144/75 135/74 119/62 (!) 115/51  Pulse: 80 87 76 91  Resp: 19 19 20 19   Temp: 98.3 F (36.8 C) 99 F (37.2 C) 98.5 F (36.9 C) 98.1 F (36.7 C)  TempSrc:      SpO2: 93% 90% 95% 95%  Weight:      Height:         Intake/Output Summary (Last 24 hours) at 11/28/2021 1250 Last data filed at 11/27/2021 1700 Gross per 24 hour  Intake 240 ml  Output --  Net 240 ml      PHYSICAL EXAM  General: Well developed, well nourished, in no acute distress HEENT:  Normocephalic and atramatic Neck:  No JVD.  Lungs: Bilateral diffuse rhonchi bilaterally to auscultation and percussion. Heart: HRRR . Normal S1 and S2 without gallops or murmurs.  Abdomen: Bowel sounds are positive, abdomen soft and non-tender  Msk:  Back normal, normal gait. Normal strength and tone for age. Extremities: No clubbing, cyanosis or edema.   Neuro: Alert and oriented X 3. Psych:  Good affect, responds appropriately   LABS: Basic Metabolic Panel: Recent Labs    11/26/21 1639 11/28/21 0556  NA 130* 133*  K 3.7 3.7  CL 92* 98  CO2 27 26  GLUCOSE 139* 111*  BUN 20 19  CREATININE 0.91 0.71  CALCIUM 9.3 8.8*   Liver Function Tests: No results for input(s): "AST", "ALT", "ALKPHOS", "BILITOT", "PROT", "ALBUMIN" in the last 72 hours. No results for input(s): "LIPASE", "AMYLASE" in the last 72 hours. CBC: Recent Labs    11/26/21 1639 11/28/21 0556  WBC 8.5 8.7  HGB 12.8 12.9  HCT 38.2 37.0  MCV 85.5 85.6  PLT 304 268   Cardiac Enzymes: No results for input(s): "CKTOTAL", "CKMB", "CKMBINDEX", "TROPONINI" in the last 72 hours. BNP: Invalid input(s): "POCBNP" D-Dimer: No results for input(s): "DDIMER" in the last 72  hours. Hemoglobin A1C: No results for input(s): "HGBA1C" in the last 72 hours. Fasting Lipid Panel: No results for input(s): "CHOL", "HDL", "LDLCALC", "TRIG", "CHOLHDL", "LDLDIRECT" in the last 72 hours. Thyroid Function Tests: No results for input(s): "TSH", "T4TOTAL", "T3FREE", "THYROIDAB" in the last 72 hours.  Invalid input(s): "FREET3" Anemia Panel: No results for input(s): "VITAMINB12", "FOLATE", "FERRITIN", "TIBC", "IRON", "RETICCTPCT" in the last 72 hours.  CT Angio Chest/Abd/Pel for Dissection W and/or Wo Contrast  Result Date: 11/26/2021 CLINICAL DATA:  Chest and back pain, emesis. EXAM: CT ANGIOGRAPHY CHEST, ABDOMEN AND PELVIS TECHNIQUE: Non-contrast CT of the chest was initially obtained. Multidetector CT imaging through the chest, abdomen and pelvis was performed using the standard protocol during bolus administration of intravenous contrast. Multiplanar reconstructed images and MIPs were obtained and reviewed to evaluate the vascular anatomy. RADIATION DOSE REDUCTION: This exam was performed according to the departmental dose-optimization program which includes automated exposure control, adjustment of the mA and/or kV according to patient size and/or use of iterative reconstruction technique. CONTRAST:  178mL OMNIPAQUE IOHEXOL 350 MG/ML SOLN COMPARISON:  CT chest 04/11/2018 FINDINGS: CTA CHEST FINDINGS Cardiovascular: Preferential opacification of the thoracic aorta. No evidence of thoracic aortic aneurysm or dissection. Normal heart size. No pericardial effusion. There are atherosclerotic calcifications of the aorta. Main pulmonary artery is enlarged compatible with  pulmonary artery hypertension. No large central pulmonary embolism. Mediastinum/Nodes: There is a small hiatal hernia. Esophagus is within normal limits. There is a hypodense right thyroid nodule measuring 7 mm which is unchanged from prior. There is an enlarged precarinal lymph node measuring 15 mm short axis, unchanged.  There are prominent bilateral hilar lymph nodes. Lungs/Pleura: There is a stable 6 mm fissural nodule in the right lower lobe image 5/77. Again seen are peripheral ground-glass and subpleural reticulations. There some bronchiectasis in the right lower lobe, unchanged. Early fibrotic changes are seen in the right lung base. Findings have minimally progressed compared to prior. No focal lung consolidation, pleural effusion or pneumothorax. Musculoskeletal: No chest wall abnormality. No acute or significant osseous findings. Review of the MIP images confirms the above findings. CTA ABDOMEN AND PELVIS FINDINGS VASCULAR Aorta: Normal caliber aorta without aneurysm, dissection, vasculitis or significant stenosis. There are atherosclerotic calcifications of the aorta. Celiac: Patent without evidence of aneurysm, dissection, vasculitis or significant stenosis. SMA: Patent without evidence of aneurysm, dissection, vasculitis or significant stenosis. Renals: Both renal arteries are patent without evidence of aneurysm, dissection, vasculitis, fibromuscular dysplasia or significant stenosis. IMA: Patent without evidence of aneurysm, dissection, vasculitis or significant stenosis. Inflow: Patent without evidence of aneurysm, dissection, vasculitis or significant stenosis. Veins: No obvious venous abnormality within the limitations of this arterial phase study. Review of the MIP images confirms the above findings. NON-VASCULAR Hepatobiliary: No focal liver abnormality is seen. No gallstones, gallbladder wall thickening, or biliary dilatation. Pancreas: Unremarkable. No pancreatic ductal dilatation or surrounding inflammatory changes. Spleen: Normal in size without focal abnormality. Adrenals/Urinary Tract: Bilateral renal cysts are present. The largest is in the superior pole of the left kidney measuring 5.5 cm. There is no hydronephrosis or perinephric fluid. The adrenal glands are within normal limits. The bladder is distended,  but otherwise within normal limits. Stomach/Bowel: Stomach is within normal limits. Appendix appears normal. No evidence of bowel wall thickening, distention, or inflammatory changes. Duodenal diverticulum present. There is diffuse colonic diverticulosis. Lymphatic: No enlarged lymph nodes are seen. Reproductive: Uterus and bilateral adnexa are unremarkable. Other: No abdominal wall hernia or abnormality. No abdominopelvic ascites. Musculoskeletal: There are degenerative changes of the spine. Review of the MIP images confirms the above findings. IMPRESSION: 1. No evidence for aortic dissection or aneurysm. 2. Findings compatible with pulmonary artery hypertension. 3. Findings compatible with interstitial lung disease with early fibrotic changes in the right lung base. Findings have minimally progressed compared to prior. 4. Stable mediastinal and hilar lymphadenopathy. 5. Stable 6 mm right lower lobe pulmonary nodule. Findings are favored as benign given stability since 2020. 6. Colonic diverticulosis. 7. Left Bosniak I benign renal cyst measuring 5.5 cm. No follow-up imaging is recommended. JACR 2018 Feb; 264-273, Management of the Incidental Renal Mass on CT, [employer] 2021; [id number], Bosniak Classification of Cystic Renal Masses, Version 2019. Aortic Atherosclerosis (ICD10-I70.0). Electronically Signed   By: Ronney Asters M.D.   On: 11/26/2021 18:57   DG Chest 2 View  Result Date: 11/26/2021 CLINICAL DATA:  Chest pain with shortness of breath EXAM: CHEST - 2 VIEW COMPARISON:  CT chest 04/11/2018 FINDINGS: There is some scattered areas of scarring bilaterally similar to prior. No focal lung consolidation, pleural effusion or pneumothorax. Cardiomediastinal silhouette is within normal limits. There surgical changes in both shoulders. IMPRESSION: No active cardiopulmonary disease. Scattered areas of scarring bilaterally similar to prior. Electronically Signed   By: Ronney Asters M.D.   On: 11/26/2021 17:24  Echo pending  TELEMETRY: Normal sinus rhythm rate of 90 nonspecific ST-T changes:  ASSESSMENT AND PLAN:  Principal Problem:   NSTEMI (non-ST elevated myocardial infarction) ([place]) Active Problems:   Benign essential hypertension   Idiopathic pulmonary fibrosis (HCC)   Paroxysmal atrial tachycardia   Chronic respiratory failure with hypoxia (HCC)   Chronic hyponatremia Demand ischemia possible Takotsubo  Plan Status post cardiac catheter no significant coronary disease mildly depressed left ventricular function possible Takotsubo recommend early discharge aspirin Plavix early follow-up with cardiology Idiopathic pulmonary fibrosis stable on oxygen outpatient follow-up with pulmonary Recent event more consistent with demand ischemia rather than acute MI Hypertension management continue clonidine verapamil Maxide Continue diuresis with Lasix as necessary Consider advancing Nexium to [place] for possible poorly controlled GERD Have the patient follow-up with cardiology 1 to 2 weeks   Yolonda Kida, MD, 11/28/2021 12:50 PM

## 2021-11-28 NOTE — Discharge Summary (Signed)
Physician Discharge Summary   Patient: Susan Sosa MRN: [id number] DOB: [birth year]  Admit date:     11/26/2021  Discharge date: 11/28/21  Discharge Physician: Arnetha Courser   PCP: Marisue Ivan, MD   Recommendations at discharge:  Please obtain CBC and BMP in 1 week Follow-up with cardiology in 1 to 2 weeks Follow-up with primary care provider  Discharge Diagnoses: Principal Problem:   NSTEMI (non-ST elevated myocardial infarction) [facility]) Active Problems:   Benign essential hypertension   Idiopathic pulmonary fibrosis (HCC)   Paroxysmal atrial tachycardia   Chronic respiratory failure with hypoxia (HCC)   Chronic hyponatremia   Hospital Course: Taken from H&P.  Susan Sosa is a 81 y.o. female with medical history significant for HTN, paroxysmal atrial tachycardia, chronic hyponatremia, interstitial pulmonary fibrosis with chronic respiratory failure on home O2 at 2 L who presents to the ED with chest pain.  Patient was awoken following a nap with severe retrosternal nonradiating chest tightness, associated with nausea, palpitations and lightheadedness and shortness of breath.  She has a chronic cough related to her interstitial pulmonary fibrosis.  She denies fever and chills.  She denies lower extremity pain or swelling.  Patient had an episode of nonbloody nonbilious vomiting on her way to the hospital. ED course and data review: BP 195/86 with otherwise unremarkable vitals.  Troponin [id number].  Sodium at baseline at 130.  COVID-negative.   EKG #2, personally viewed and interpreted showed NSR at 75 with no acute ST-T wave changes. CT angio chest negative for PE and showing the following findings: IMPRESSION: 1. No evidence for aortic dissection or aneurysm. 2. Findings compatible with pulmonary artery hypertension. 3. Findings compatible with interstitial lung disease with early fibrotic changes in the right lung base. Findings have minimally progressed compared to  prior. 4. Stable mediastinal and hilar lymphadenopathy. 5. Stable 6 mm right lower lobe pulmonary nodule. Findings are favored as benign given stability since 2020. 6. Colonic diverticulosis. 7. Left Bosniak I benign renal cyst measuring 5.5 cm. No follow-up imaging is recommended.   Patient's initial EKG had shown questionable changes in anterior leads and cardiologist, Dr. Juliann Pares was consulted from the ED and recommended running the EKG by on-call STEMI, Dr. Zena Amos who subsequently decided that EKG did not meet STEMI criteria Patient was given chewable aspirin and started on heparin infusion.  Nitroglycerin infusion was started due to residual mild chest pain.   11/3: Hemodynamically stable, saturating 95% on 4 L.  Troponin peaked at 7967 and now trending down, most recent at 5472.  Cardiology is planning to take her to Cath Lab.  11/4: Hemodynamically stable.  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  Apparently patient was unable to tolerate ACE ARB or  ARN I in the past.  Labs remained stable.  Saturating well on baseline oxygen requirement. Cardiology added Plavix.  No more chest pain.  Patient wants to go home. Patient is on multiple antihypertensives at home, holding Maxide until she sees her cardiologist and they can restart if needed.  Blood pressure well controlled without Maxzide , Lasix and clonidine in the hospital.  Patient will continue the rest of her home medications and need to have a close follow-up with her providers for further recommendations.  Assessment and Plan: * NSTEMI (non-ST elevated myocardial infarction) [facility]) Patient presents with typical pain,Troponin [phone] He was on heparin infusion and cardiology was consulted Plan was to take him to cardiac cath later today -Follow-up post cath recommendations -Continue aspirin, and statin  Benign essential hypertension Patient is normotensive and is being  placed on nitroglycerin infusion -Continue holding home antihypertensives of hydralazine, and Maxide, furosemide as patient is on nitroglycerin Patient is on verapamil for MAT so no beta code started at this time  Idiopathic pulmonary fibrosis (HCC) Chronic respiratory failure with hypoxia Not acutely exacerbated Continue pirfenidone Albuterol as needed Continue supplemental oxygen Follows with pulmonology  Paroxysmal atrial tachycardia Continue verapamil  Chronic respiratory failure with hypoxia ([place]) -See above under pulmonary fibrosis -Stable on baseline oxygen requirement  Chronic hyponatremia Sodium at baseline at 130   Consultants: Cardiology Procedures performed: Cardiac catheterization Disposition: Home Diet recommendation:  Discharge Diet Orders (From admission, onward)     Start     Ordered   11/28/21 0000  Diet - low sodium heart healthy        11/28/21 1230           Cardiac diet DISCHARGE MEDICATION: Allergies as of 11/28/2021       Reactions   Latex Itching, Rash   Lidocaine Swelling   Facial swelling from a dentist Facial swelling from a dentist Facial swelling from a dentist   Atorvastatin    Severe hip pain   Losartan Swelling   Metronidazole Other (See Comments)   Sore joints Sore joints   Oxycodone Nausea Only        Medication List     TAKE these medications    albuterol (2.5 MG/3ML) 0.083% nebulizer solution Commonly known as: PROVENTIL Inhale into the lungs.   ascorbic acid 500 MG tablet Commonly known as: VITAMIN C ascorbic acid (vitamin C) 500 mg tablet   1  by oral route.   aspirin 81 MG chewable tablet aspirin 81 mg chewable tablet   81 mg by oral route.   atorvastatin 40 MG tablet Commonly known as: LIPITOR Take 1 tablet by mouth daily.   calcium-vitamin D 500-200 MG-UNIT Tabs tablet Commonly known as: OSCAL WITH D Take 1 tablet by mouth daily.   cloNIDine 0.1 MG tablet Commonly known as: CATAPRES Take 0.1  mg by mouth 2 (two) times daily.   clopidogrel 75 MG tablet Commonly known as: PLAVIX Take 1 tablet (75 mg total) by mouth daily with breakfast. Start taking on: November 29, 2021   cyanocobalamin 1000 MCG tablet cyanocobalamin (vit B-12) 1,000 mcg tablet   1000 ugs by oral route.   Esbriet 267 MG Caps Generic drug: Pirfenidone Take 267 mg by mouth with breakfast, with lunch, and with evening meal.   esomeprazole 40 MG capsule Commonly known as: NEXIUM Take 1 capsule by mouth at bedtime.   FIBER-CAPS PO Take by mouth.   Fish Oil 1000 MG Caps Take by mouth.   furosemide 20 MG tablet Commonly known as: LASIX Take 1 tablet by mouth daily.   hydrocortisone 25 MG suppository Commonly known as: ANUSOL-HC Place 1 suppository (25 mg total) rectally every 12 (twelve) hours.   levalbuterol 1.25 MG/3ML nebulizer solution Commonly known as: XOPENEX Inhale into the lungs.   magnesium oxide 400 MG tablet Commonly known as: MAG-OX magnesium oxide 400 mg (241.3 mg magnesium) tablet   400 mg twice a day by oral route.   multivitamin-iron-minerals-folic acid chewable tablet Chew by mouth.   nitroGLYCERIN 0.4 MG SL tablet Commonly known as: NITROSTAT Place 1 tablet (0.4 mg total) under the tongue every 5 (five) minutes as needed for chest pain.   ondansetron 8 MG tablet Commonly known as: ZOFRAN TAKE 1 TABLET BY MOUTH EVERY 12 HOURS  AS NEEDED FOR NAUSEA.   OXYGEN Inhale 2 L into the lungs continuous.   polyethylene glycol powder 17 GM/SCOOP powder Commonly known as: GLYCOLAX/MIRALAX 1 capful once a day , may use 2 per day if continued constipation   prednisoLONE acetate 1 % ophthalmic suspension Commonly known as: PRED FORTE prednisolone acetate 1 % eye drops,suspension  INSTILL 1 DROP INTO LEFT EYE 3 TIMES A DAY   triamcinolone cream 0.1 % Commonly known as: KENALOG Apply topically 3 (three) times daily.   triamterene-hydrochlorothiazide 75-50 MG tablet Commonly  known as: MAXZIDE Take 1 tablet by mouth daily. Hold until you see your cardiologist What changed: See the new instructions.   verapamil 180 MG CR tablet Commonly known as: CALAN-SR Take by mouth.   vitamin E 180 MG (400 UNITS) capsule vitamin E 268 mg (400 unit) capsule   400 units by oral route.        Follow-up Information     Alwyn Pea, MD. Go in 1 week(s).   Specialties: Cardiology, Internal Medicine Contact information: [address] [place] [address] [phone]         Marisue Ivan, MD. Schedule an appointment as soon as possible for a visit in 1 week(s).   Specialty: Family Medicine Contact information: [address] [facility] [place] [address] [phone]                Discharge Exam: Ceasar Mons Weights   11/26/21 1637 11/27/21 1040  Weight: 62.4 kg 61.2 kg   General.     In no acute distress. Pulmonary.  Lungs clear bilaterally, normal respiratory effort. CV.  Regular rate and rhythm, no JVD, rub or murmur. Abdomen.  Soft, nontender, nondistended, BS positive. CNS.  Alert and oriented .  No focal neurologic deficit. Extremities.  No edema, no cyanosis, pulses intact and symmetrical. Psychiatry.  Judgment and insight appears normal.   Condition at discharge: stable  The results of significant diagnostics from this hospitalization (including imaging, microbiology, ancillary and laboratory) are listed below for reference.   Imaging Studies: CT Angio Chest/Abd/Pel for Dissection W and/or Wo Contrast  Result Date: 11/26/2021 CLINICAL DATA:  Chest and back pain, emesis. EXAM: CT ANGIOGRAPHY CHEST, ABDOMEN AND PELVIS TECHNIQUE: Non-contrast CT of the chest was initially obtained. Multidetector CT imaging through the chest, abdomen and pelvis was performed using the standard protocol during bolus administration of intravenous contrast. Multiplanar reconstructed images and MIPs were obtained and reviewed  to evaluate the vascular anatomy. RADIATION DOSE REDUCTION: This exam was performed according to the departmental dose-optimization program which includes automated exposure control, adjustment of the mA and/or kV according to patient size and/or use of iterative reconstruction technique. CONTRAST:  OMNIPAQUE IOHEXOL 350 MG/ML SOLN COMPARISON:  CT chest 04/11/2018 FINDINGS: CTA CHEST FINDINGS Cardiovascular: Preferential opacification of the thoracic aorta. No evidence of thoracic aortic aneurysm or dissection. Normal heart size. No pericardial effusion. There are atherosclerotic calcifications of the aorta. Main pulmonary artery is enlarged compatible with pulmonary artery hypertension. No large central pulmonary embolism. Mediastinum/Nodes: There is a small hiatal hernia. Esophagus is within normal limits. There is a hypodense right thyroid nodule measuring 7 mm which is unchanged from prior. There is an enlarged precarinal lymph node measuring 15 mm short axis, unchanged. There are prominent bilateral hilar lymph nodes. Lungs/Pleura: There is a stable 6 mm fissural nodule in the right lower lobe image 5/77. Again seen are peripheral ground-glass and subpleural reticulations. There some bronchiectasis in the  right lower lobe, unchanged. Early fibrotic changes are seen in the right lung base. Findings have minimally progressed compared to prior. No focal lung consolidation, pleural effusion or pneumothorax. Musculoskeletal: No chest wall abnormality. No acute or significant osseous findings. Review of the MIP images confirms the above findings. CTA ABDOMEN AND PELVIS FINDINGS VASCULAR Aorta: Normal caliber aorta without aneurysm, dissection, vasculitis or significant stenosis. There are atherosclerotic calcifications of the aorta. Celiac: Patent without evidence of aneurysm, dissection, vasculitis or significant stenosis. SMA: Patent without evidence of aneurysm, dissection, vasculitis or significant  stenosis. Renals: Both renal arteries are patent without evidence of aneurysm, dissection, vasculitis, fibromuscular dysplasia or significant stenosis. IMA: Patent without evidence of aneurysm, dissection, vasculitis or significant stenosis. Inflow: Patent without evidence of aneurysm, dissection, vasculitis or significant stenosis. Veins: No obvious venous abnormality within the limitations of this arterial phase study. Review of the MIP images confirms the above findings. NON-VASCULAR Hepatobiliary: No focal liver abnormality is seen. No gallstones, gallbladder wall thickening, or biliary dilatation. Pancreas: Unremarkable. No pancreatic ductal dilatation or surrounding inflammatory changes. Spleen: Normal in size without focal abnormality. Adrenals/Urinary Tract: Bilateral renal cysts are present. The largest is in the superior pole of the left kidney measuring 5.5 cm. There is no hydronephrosis or perinephric fluid. The adrenal glands are within normal limits. The bladder is distended, but otherwise within normal limits. Stomach/Bowel: Stomach is within normal limits. Appendix appears normal. No evidence of bowel wall thickening, distention, or inflammatory changes. Duodenal diverticulum present. There is diffuse colonic diverticulosis. Lymphatic: No enlarged lymph nodes are seen. Reproductive: Uterus and bilateral adnexa are unremarkable. Other: No abdominal wall hernia or abnormality. No abdominopelvic ascites. Musculoskeletal: There are degenerative changes of the spine. Review of the MIP images confirms the above findings. IMPRESSION: 1. No evidence for aortic dissection or aneurysm. 2. Findings compatible with pulmonary artery hypertension. 3. Findings compatible with interstitial lung disease with early fibrotic changes in the right lung base. Findings have minimally progressed compared to prior. 4. Stable mediastinal and hilar lymphadenopathy. 5. Stable 6 mm right lower lobe pulmonary nodule. Findings are  favored as benign given stability since 2020. 6. Colonic diverticulosis. 7. Left Bosniak I benign renal cyst measuring 5.5 cm. No follow-up imaging is recommended. JACR 2018 Feb; 264-273, Management of the Incidental Renal Mass on CT, [employer] 2021; [id number], Bosniak Classification of Cystic Renal Masses, Version 2019. Aortic Atherosclerosis (ICD10-I70.0). Electronically Signed   By: Darliss Cheney M.D.   On: 11/26/2021 18:57   DG Chest 2 View  Result Date: 11/26/2021 CLINICAL DATA:  Chest pain with shortness of breath EXAM: CHEST - 2 VIEW COMPARISON:  CT chest 04/11/2018 FINDINGS: There is some scattered areas of scarring bilaterally similar to prior. No focal lung consolidation, pleural effusion or pneumothorax. Cardiomediastinal silhouette is within normal limits. There surgical changes in both shoulders. IMPRESSION: No active cardiopulmonary disease. Scattered areas of scarring bilaterally similar to prior. Electronically Signed   By: Darliss Cheney M.D.   On: 11/26/2021 17:24    Microbiology: Results for orders placed or performed during the hospital encounter of 11/26/21  SARS Coronavirus 2 by RT PCR (hospital order, performed in [facility] hospital lab) *cepheid single result test* Anterior Nasal Swab     Status: None   Collection Time: 11/26/21  6:46 PM   Specimen: Anterior Nasal Swab  Result Value Ref Range Status   SARS Coronavirus 2 by RT PCR NEGATIVE NEGATIVE Final    Comment: (NOTE) SARS-CoV-2 target nucleic acids are NOT DETECTED.  The SARS-CoV-2 RNA is generally detectable in upper and lower respiratory specimens during the acute phase of infection. The lowest concentration of SARS-CoV-2 viral copies this assay can detect is 250 copies / mL. A negative result does not preclude SARS-CoV-2 infection and should not be used as the sole basis for treatment or other patient management decisions.  A negative result may occur with improper specimen collection / handling, submission of  specimen other than nasopharyngeal swab, presence of viral mutation(s) within the areas targeted by this assay, and inadequate number of viral copies (<250 copies / mL). A negative result must be combined with clinical observations, patient history, and epidemiological information.  Fact Sheet for Patients:   RoadLapTop.co.zahttps://www.fda.gov/media/158405/download  Fact Sheet for Healthcare Providers: http://kim-miller.com/https://www.fda.gov/media/158404/download  This test is not yet approved or  cleared by the [place] FDA and has been authorized for detection and/or diagnosis of SARS-CoV-2 by FDA under an Emergency Use Authorization (EUA).  This EUA will remain in effect (meaning this test can be used) for the duration of the COVID-19 declaration under Section 564(b)(1) of the Act, 21 U.S.C. section 360bbb-3(b)(1), unless the authorization is terminated or revoked sooner.  Performed at [facility], [address] [place], [place] [id number]     Labs: CBC: Recent Labs  Lab 11/26/21 1639 11/28/21 0556  WBC 8.5 8.7  HGB 12.8 12.9  HCT 38.2 37.0  MCV 85.5 85.6  PLT 304 268   Basic Metabolic Panel: Recent Labs  Lab 11/26/21 1639 11/28/21 0556  NA 130* 133*  K 3.7 3.7  CL 92* 98  CO2 27 26  GLUCOSE 139* 111*  BUN 20 19  CREATININE 0.91 0.71  CALCIUM 9.3 8.8*   Liver Function Tests: No results for input(s): "AST", "ALT", "ALKPHOS", "BILITOT", "PROT", "ALBUMIN" in the last 168 hours. CBG: No results for input(s): "GLUCAP" in the last 168 hours.  Discharge time spent: greater than 30 minutes.  This record has been created using Conservation officer, historic buildingsDragon voice recognition software. Errors have been sought and corrected,but may not always be located. Such creation errors do not reflect on the standard of care.   Signed: Arnetha CourserSumayya Kylah Maresh, MD [facility] 11/28/2021

## 2021-11-30 LAB — CARDIAC CATHETERIZATION: Cath EF Quantitative: 40 %

## 2021-12-01 ENCOUNTER — Encounter: Payer: Self-pay | Admitting: Internal Medicine

## 2022-05-23 ENCOUNTER — Encounter: Payer: Self-pay | Admitting: Emergency Medicine

## 2022-05-23 ENCOUNTER — Observation Stay
Admission: EM | Admit: 2022-05-23 | Discharge: 2022-05-24 | Disposition: A | Discharge status: Home / Self Care | Payer: Medicare Other | Attending: Obstetrics and Gynecology | Admitting: Obstetrics and Gynecology

## 2022-05-23 ENCOUNTER — Emergency Department: Payer: Medicare Other

## 2022-05-23 ENCOUNTER — Other Ambulatory Visit: Payer: Self-pay

## 2022-05-23 DIAGNOSIS — S0990XA Unspecified injury of head, initial encounter: Secondary | ICD-10-CM | POA: Diagnosis not present

## 2022-05-23 DIAGNOSIS — W01198A Fall on same level from slipping, tripping and stumbling with subsequent striking against other object, initial encounter: Secondary | ICD-10-CM | POA: Diagnosis not present

## 2022-05-23 DIAGNOSIS — I10 Essential (primary) hypertension: Secondary | ICD-10-CM | POA: Diagnosis present

## 2022-05-23 DIAGNOSIS — J9611 Chronic respiratory failure with hypoxia: Secondary | ICD-10-CM | POA: Diagnosis present

## 2022-05-23 DIAGNOSIS — Z79899 Other long term (current) drug therapy: Secondary | ICD-10-CM | POA: Diagnosis not present

## 2022-05-23 DIAGNOSIS — S0181XA Laceration without foreign body of other part of head, initial encounter: Secondary | ICD-10-CM | POA: Diagnosis present

## 2022-05-23 DIAGNOSIS — Y92031 Bathroom in apartment as the place of occurrence of the external cause: Secondary | ICD-10-CM | POA: Insufficient documentation

## 2022-05-23 DIAGNOSIS — Z9181 History of falling: Secondary | ICD-10-CM | POA: Diagnosis not present

## 2022-05-23 DIAGNOSIS — Z7902 Long term (current) use of antithrombotics/antiplatelets: Secondary | ICD-10-CM | POA: Diagnosis not present

## 2022-05-23 DIAGNOSIS — I214 Non-ST elevation (NSTEMI) myocardial infarction: Secondary | ICD-10-CM | POA: Diagnosis present

## 2022-05-23 DIAGNOSIS — E871 Hypo-osmolality and hyponatremia: Secondary | ICD-10-CM

## 2022-05-23 DIAGNOSIS — M6281 Muscle weakness (generalized): Secondary | ICD-10-CM | POA: Insufficient documentation

## 2022-05-23 DIAGNOSIS — I428 Other cardiomyopathies: Secondary | ICD-10-CM

## 2022-05-23 DIAGNOSIS — Z91040 Latex allergy status: Secondary | ICD-10-CM | POA: Insufficient documentation

## 2022-05-23 DIAGNOSIS — Z7982 Long term (current) use of aspirin: Secondary | ICD-10-CM | POA: Insufficient documentation

## 2022-05-23 DIAGNOSIS — W19XXXA Unspecified fall, initial encounter: Secondary | ICD-10-CM | POA: Diagnosis present

## 2022-05-23 DIAGNOSIS — H05233 Hemorrhage of bilateral orbit: Secondary | ICD-10-CM | POA: Diagnosis not present

## 2022-05-23 DIAGNOSIS — R0902 Hypoxemia: Secondary | ICD-10-CM

## 2022-05-23 DIAGNOSIS — R2681 Unsteadiness on feet: Secondary | ICD-10-CM | POA: Insufficient documentation

## 2022-05-23 DIAGNOSIS — K219 Gastro-esophageal reflux disease without esophagitis: Secondary | ICD-10-CM | POA: Diagnosis not present

## 2022-05-23 DIAGNOSIS — J84112 Idiopathic pulmonary fibrosis: Secondary | ICD-10-CM | POA: Diagnosis present

## 2022-05-23 DIAGNOSIS — I2510 Atherosclerotic heart disease of native coronary artery without angina pectoris: Secondary | ICD-10-CM | POA: Insufficient documentation

## 2022-05-23 DIAGNOSIS — S0083XA Contusion of other part of head, initial encounter: Secondary | ICD-10-CM

## 2022-05-23 DIAGNOSIS — S0993XA Unspecified injury of face, initial encounter: Secondary | ICD-10-CM | POA: Insufficient documentation

## 2022-05-23 LAB — COMPREHENSIVE METABOLIC PANEL
ALT: 24 U/L (ref 0–44)
AST: 29 U/L (ref 15–41)
Albumin: 3.9 g/dL (ref 3.5–5.0)
Alkaline Phosphatase: 102 U/L (ref 38–126)
Anion gap: 7 (ref 5–15)
BUN: 23 mg/dL (ref 8–23)
CO2: 29 mmol/L (ref 22–32)
Calcium: 9 mg/dL (ref 8.9–10.3)
Chloride: 91 mmol/L — ABNORMAL LOW (ref 98–111)
Creatinine, Ser: 0.67 mg/dL (ref 0.44–1.00)
GFR, Estimated: >60 mL/min (ref >60)
Glucose, Bld: 120 mg/dL — ABNORMAL HIGH (ref 70–99)
Potassium: 4.1 mmol/L (ref 3.5–5.1)
Sodium: 127 mmol/L — ABNORMAL LOW (ref 135–145)
Total Bilirubin: 0.6 mg/dL (ref 0.3–1.2)
Total Protein: 7.4 g/dL (ref 6.5–8.1)

## 2022-05-23 LAB — TROPONIN I (HIGH SENSITIVITY): Troponin I (High Sensitivity): 4 ng/L (ref <18)

## 2022-05-23 LAB — CBC
HCT: 36.2 % (ref 36.0–46.0)
Hemoglobin: 12.5 g/dL (ref 12.0–15.0)
MCH: 29.6 pg (ref 26.0–34.0)
MCHC: 34.5 g/dL (ref 30.0–36.0)
MCV: 85.6 fL (ref 80.0–100.0)
Platelets: 286 10*3/uL (ref 150–400)
RBC: 4.23 MIL/uL (ref 3.87–5.11)
RDW: 13.2 % (ref 11.5–15.5)
WBC: 8.5 10*3/uL (ref 4.0–10.5)
nRBC: 0 % (ref 0.0–0.2)

## 2022-05-23 LAB — PROTIME-INR
INR: 1 (ref 0.8–1.2)
Prothrombin Time: 12.9 seconds (ref 11.4–15.2)

## 2022-05-23 MED ORDER — SODIUM CHLORIDE 0.9 % IV BOLUS
1000.0000 mL | Freq: Once | INTRAVENOUS | Status: AC
  Administered 2022-05-24: 1000 mL via INTRAVENOUS

## 2022-05-23 NOTE — ED Provider Notes (Incomplete)
[facility] Provider Note    Event Date/Time   First MD Initiated Contact with Patient 05/23/22 2328     (approximate)   History   Fall   HPI  Susan Sosa is a 82 y.o. female who presents to the ED from home status post mechanical fall with facial laceration.  Patient tripped over her oxygen tubing and fell onto tile floor.  Presents with noncontrol bleeding from forehead laceration and bruising to both eyes, left greater than right.  She does take Plavix.  Denies LOC.  Denies vision changes other than left eye swelling, headache, neck pain, chest pain, shortness of breath, abdominal pain, nausea, vomiting or dizziness.  Tetanus is up-to-date.     Past Medical History   Past Medical History:  Diagnosis Date  . Arthritis   . Heart murmur   . Hyperlipidemia   . Hypertension   . Hyponatremia   . IPF (idiopathic pulmonary fibrosis) [facility])      Active Problem List   Patient Active Problem List   Diagnosis Date Noted  . NSTEMI (non-ST elevated myocardial infarction) (HCC) 11/26/2021  . Chronic respiratory failure with hypoxia (HCC) 11/26/2021  . Chronic hyponatremia 11/26/2021  . Degeneration of lumbosacral intervertebral disc 08/04/2020  . Full thickness rotator cuff tear 08/04/2020  . Osteoarthritis of knee 08/04/2020  . Systolic murmur 09/04/2019  . Idiopathic pulmonary fibrosis (HCC) 02/26/2019  . 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  . Intermediate stage nonexudative age-related macular degeneration of both eyes 10/21/2017  . Posterior vitreous detachment of left eye 10/21/2017  . Presence of intraocular lens 10/21/2017  . 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  . Hypomagnesemia 09/07/2017  . Prediabetes 04/18/2016  . Bilateral renal cysts 07/18/2014  . Benign essential hypertension 07/18/2014  . Paroxysmal atrial tachycardia 07/18/2014  . Dyspnea 08/10/2011  . Hypercholesterolemia 08/10/2011     Past Surgical History   Past  Surgical History:  Procedure Laterality Date  . LEFT HEART CATH AND CORONARY ANGIOGRAPHY N/A 11/27/2021   Procedure: LEFT HEART CATH AND CORONARY ANGIOGRAPHY and possible pci and stent;  Surgeon: Alwyn Pea, MD;  Location: [facility] INVASIVE CV LAB;  Service: Cardiovascular;  Laterality: N/A;     Home Medications   Prior to Admission medications   Medication Sig Start Date End Date Taking? Authorizing Provider  albuterol (PROVENTIL) (2.5 MG/3ML) 0.083% nebulizer solution Inhale into the lungs. 11/13/19   [provider]  ascorbic acid (VITAMIN C) 500 MG tablet ascorbic acid (vitamin C) 500 mg tablet   1  by oral route. 05/08/21   Campbell Lerner, MD  aspirin 81 MG chewable tablet aspirin 81 mg chewable tablet   81 mg by oral route.    [provider]  atorvastatin (LIPITOR) 40 MG tablet Take 1 tablet by mouth daily. 06/27/20   [provider]  Calcium Polycarbophil (FIBER-CAPS PO) Take by mouth.    [provider]  calcium-vitamin D (OSCAL WITH D) 500-200 MG-UNIT TABS tablet Take 1 tablet by mouth daily.    [provider]  cloNIDine (CATAPRES) 0.1 MG tablet Take 0.1 mg by mouth 2 (two) times daily.    [provider]  clopidogrel (PLAVIX) 75 MG tablet Take 1 tablet (75 mg total) by mouth daily with breakfast. 11/29/21   Arnetha Courser, MD  cyanocobalamin 1000 MCG tablet cyanocobalamin (vit B-12) 1,000 mcg tablet   1000 ugs by oral route.    [provider]  esomeprazole (NEXIUM) 40 MG capsule Take 1 capsule by  mouth at bedtime. 05/30/20   [provider]  furosemide (LASIX) 20 MG tablet Take 1 tablet by mouth daily. 09/11/21   [provider]  levalbuterol Pauline Aus) 1.25 MG/3ML nebulizer solution Inhale into the lungs. 03/13/19   [provider]  magnesium oxide (MAG-OX) 400 MG tablet magnesium oxide 400 mg (241.3 mg magnesium) tablet   400 mg twice a day by oral route. 01/20/15   [provider]   multivitamin-iron-minerals-folic acid (CENTRUM) chewable tablet Chew by mouth.    [provider]  nitroGLYCERIN (NITROSTAT) 0.4 MG SL tablet Place 1 tablet (0.4 mg total) under the tongue every 5 (five) minutes as needed for chest pain. 11/28/21   Arnetha Courser, MD  Omega-3 Fatty Acids (FISH OIL) 1000 MG CAPS Take by mouth.    [provider]  ondansetron (ZOFRAN) 8 MG tablet TAKE 1 TABLET BY MOUTH EVERY 12 HOURS AS NEEDED FOR NAUSEA. 09/29/21   [provider]  OXYGEN Inhale 2 L into the lungs continuous.    [provider]  Pirfenidone (ESBRIET) 267 MG CAPS Take 267 mg by mouth with breakfast, with lunch, and with evening meal. 11/21/19   [provider]  polyethylene glycol powder (GLYCOLAX/MIRALAX) 17 GM/SCOOP powder 1 capful once a day , may use 2 per day if continued constipation 04/22/21   Sherrie Mustache, Roselyn Bering, PA-C  prednisoLONE acetate (PRED FORTE) 1 % ophthalmic suspension prednisolone acetate 1 % eye drops,suspension  INSTILL 1 DROP INTO LEFT EYE 3 TIMES A DAY    [provider]  triamcinolone cream (KENALOG) 0.1 % Apply topically 3 (three) times daily. 08/11/21   [provider]  triamterene-hydrochlorothiazide (MAXZIDE) 75-50 MG tablet Take 1 tablet by mouth daily. Hold until you see your cardiologist 11/28/21   Arnetha Courser, MD  verapamil (CALAN-SR) 180 MG CR tablet Take by mouth. 07/21/20   [provider]  vitamin E 180 MG (400 UNITS) capsule vitamin E 268 mg (400 unit) capsule   400 units by oral route.    [provider]     Allergies  Latex, Lidocaine, Atorvastatin, Losartan, Metronidazole, and Oxycodone   Family History  History reviewed. No pertinent family history.   Physical Exam  Triage Vital Signs: ED Triage Vitals  Enc Vitals Group     BP 05/23/22 2255 (!) 186/80     Pulse Rate 05/23/22 2255 90     Resp 05/23/22 2255 20     Temp 05/23/22 2258 98 F (36.7 C)     Temp Source 05/23/22  2258 Oral     SpO2 05/23/22 2255 91 %     Weight 05/23/22 2249 136 lb 11 oz (62 kg)     Height 05/23/22 2249 4\' 11"  (1.499 m)     Head Circumference --      Peak Flow --      Pain Score 05/23/22 2249 5     Pain Loc --      Pain Edu? --      Excl. in GC? --     Updated Vital Signs: BP (!) 186/80 (BP Location: Right Arm)   Pulse 90   Temp 98 F (36.7 C) (Oral)   Resp 20   Ht 4\' 11"  (1.499 m)   Wt 62 kg   SpO2 91%   BMI 27.61 kg/m   {Only need to document appropriate and relevant physical exam:1} General: Awake, no distress. *** CV:  Good peripheral perfusion. *** Resp:  Normal effort. *** Abd:  No  distention. *** Other:  ***   ED Results / Procedures / Treatments  Labs (all labs ordered are listed, but only abnormal results are displayed) Labs Reviewed  CBC  COMPREHENSIVE METABOLIC PANEL  PROTIME-INR  TROPONIN I (HIGH SENSITIVITY)     EKG  ***   RADIOLOGY *** {You MUST document your own interpretation of imaging, as well as the fact that you reviewed the radiologist's report!:1}  Official radiology report(s): No results found.   PROCEDURES:  Critical Care performed: {CriticalCareYesNo:19197::"Yes, see critical care procedure note(s)","No"}  Procedures   MEDICATIONS ORDERED IN ED: Medications  sodium chloride 0.9 % bolus 1,000 mL (has no administration in time range)     IMPRESSION / MDM / ASSESSMENT AND PLAN / ED COURSE  I reviewed the triage vital signs and the nursing notes.                              Differential diagnosis includes, but is not limited to, ***  Patient's presentation is most consistent with {EM COPA:27473}  {If the patient is on the monitor, remove the brackets and asterisks on the sentence below and remember to document it as a Procedure as well. Otherwise delete the sentence below:1} {**The patient is on the cardiac monitor to evaluate for evidence of arrhythmia and/or significant heart rate changes.**}  {Remember  to include, when applicable, any/all of the following data: independent review of imaging independent review of labs (comment specifically on pertinent positives and negatives) review of specific prior hospitalizations, PCP/specialist notes, etc. discuss meds given and prescribed document any discussion with consultants (including hospitalists) any clinical decision tools you used and why (PECARN, NEXUS, etc.) did you consider admitting the patient? document social determinants of health affecting patient's care (homelessness, inability to follow up in a timely fashion, etc) document any pre-existing conditions increasing risk on current visit (e.g. diabetes and HTN increasing danger of high-risk chest pain/ACS) describes what meds you gave (especially parenteral) and why any other interventions?:1}      FINAL CLINICAL IMPRESSION(S) / ED DIAGNOSES   Final diagnoses:  Injury of head, initial encounter  Contusion of face, initial encounter  Fall in home, initial encounter  Facial laceration, initial encounter     Rx / DC Orders   ED Discharge Orders     None        Note:  This document was prepared using Dragon voice recognition software and may include unintentional dictation errors.

## 2022-05-23 NOTE — ED Triage Notes (Signed)
Patient c/o fall and hitting her face on the floor this evening.  Patient takes plavix.  Patient denies LOC.  Patient is profusely bleeding with rag to face in triage.  Patient on 2L [place] at baseline.  Head bandaged in triage, bleeding not controlled.

## 2022-05-23 NOTE — ED Provider Notes (Signed)
[facility] Provider Note    Event Date/Time   First MD Initiated Contact with Patient 05/23/22 2328     (approximate)   History   Fall   HPI  Susan Sosa is a 82 y.o. female who presents to the ED from home status post mechanical fall with facial laceration.  Patient tripped over her oxygen tubing and fell onto tile floor.  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  Presents with noncontrol bleeding from forehead laceration and bruising to both eyes, left greater than right.  She does take Plavix.  Denies LOC.  Denies vision changes other than left eye swelling, headache, neck pain, chest pain, shortness of breath, abdominal pain, nausea, vomiting or dizziness.  Tetanus is up-to-date.     Past Medical History   Past Medical History:  Diagnosis Date   Arthritis    Heart murmur    Hyperlipidemia    Hypertension    Hyponatremia    IPF (idiopathic pulmonary fibrosis) [facility])      Active Problem List   Patient Active Problem List   Diagnosis Date Noted   NSTEMI (non-ST elevated myocardial infarction) (HCC) 11/26/2021   Chronic respiratory failure with hypoxia (HCC) 11/26/2021   Chronic hyponatremia 11/26/2021   Degeneration of lumbosacral intervertebral disc 08/04/2020   Full thickness rotator cuff tear 08/04/2020   Osteoarthritis of knee 08/04/2020   Systolic murmur 09/04/2019   Idiopathic pulmonary fibrosis (HCC) 02/26/2019   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   Intermediate stage nonexudative age-related macular degeneration of both eyes 10/21/2017   Posterior vitreous detachment of left eye 10/21/2017   Presence of intraocular lens 10/21/2017   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   Hypomagnesemia 09/07/2017   Prediabetes 04/18/2016   Bilateral renal cysts 07/18/2014   Benign essential hypertension 07/18/2014   Paroxysmal atrial tachycardia 07/18/2014   Dyspnea 08/10/2011   Hypercholesterolemia 08/10/2011     Past  Surgical History   Past Surgical History:  Procedure Laterality Date   LEFT HEART CATH AND CORONARY ANGIOGRAPHY N/A 11/27/2021   Procedure: LEFT HEART CATH AND CORONARY ANGIOGRAPHY and possible pci and stent;  Surgeon: Alwyn Pea, MD;  Location: [facility] INVASIVE CV LAB;  Service: Cardiovascular;  Laterality: N/A;     Home Medications   Prior to Admission medications   Medication Sig Start Date End Date Taking? Authorizing Provider  albuterol (PROVENTIL) (2.5 MG/3ML) 0.083% nebulizer solution Inhale into the lungs. 11/13/19   [provider]  ascorbic acid (VITAMIN C) 500 MG tablet ascorbic acid (vitamin C) 500 mg tablet   1  by oral route. 05/08/21   Campbell Lerner, MD  aspirin 81 MG chewable tablet aspirin 81 mg chewable tablet   81 mg by oral route.    [provider]  atorvastatin (LIPITOR) 40 MG tablet Take 1 tablet by mouth daily. 06/27/20   [provider]  Calcium Polycarbophil (FIBER-CAPS PO) Take by mouth.    [provider]  calcium-vitamin D (OSCAL WITH D) 500-200 MG-UNIT TABS tablet Take 1 tablet by mouth daily.    [provider]  cloNIDine (CATAPRES) 0.1 MG tablet Take 0.1 mg by mouth 2 (two) times daily.    [provider]  clopidogrel (PLAVIX) 75 MG tablet Take 1 tablet (75 mg total) by mouth daily with breakfast. 11/29/21   Arnetha Courser, MD  cyanocobalamin 1000 MCG tablet cyanocobalamin (vit B-12) 1,000 mcg tablet   1000 ugs by oral route.    [provider]  esomeprazole (NEXIUM) 40 MG capsule Take 1 capsule by mouth at bedtime. 05/30/20   [provider]  furosemide (LASIX) 20 MG tablet Take 1 tablet by mouth daily. 09/11/21   [provider]  levalbuterol Pauline Aus) 1.25 MG/3ML nebulizer solution Inhale into the lungs. 03/13/19   [provider]  magnesium oxide (MAG-OX) 400 MG tablet magnesium oxide 400 mg (241.3 mg magnesium) tablet   400 mg twice a day by oral route. 01/20/15    [provider]  multivitamin-iron-minerals-folic acid (CENTRUM) chewable tablet Chew by mouth.    [provider]  nitroGLYCERIN (NITROSTAT) 0.4 MG SL tablet Place 1 tablet (0.4 mg total) under the tongue every 5 (five) minutes as needed for chest pain. 11/28/21   Arnetha Courser, MD  Omega-3 Fatty Acids (FISH OIL) 1000 MG CAPS Take by mouth.    [provider]  ondansetron (ZOFRAN) 8 MG tablet TAKE 1 TABLET BY MOUTH EVERY 12 HOURS AS NEEDED FOR NAUSEA. 09/29/21   [provider]  OXYGEN Inhale 2 L into the lungs continuous.    [provider]  Pirfenidone (ESBRIET) 267 MG CAPS Take 267 mg by mouth with breakfast, with lunch, and with evening meal. 11/21/19   [provider]  polyethylene glycol powder (GLYCOLAX/MIRALAX) 17 GM/SCOOP powder 1 capful once a day , may use 2 per day if continued constipation 04/22/21   Sherrie Mustache, Roselyn Bering, PA-C  prednisoLONE acetate (PRED FORTE) 1 % ophthalmic suspension prednisolone acetate 1 % eye drops,suspension  INSTILL 1 DROP INTO LEFT EYE 3 TIMES A DAY    [provider]  triamcinolone cream (KENALOG) 0.1 % Apply topically 3 (three) times daily. 08/11/21   [provider]  triamterene-hydrochlorothiazide (MAXZIDE) 75-50 MG tablet Take 1 tablet by mouth daily. Hold until you see your cardiologist 11/28/21   Arnetha Courser, MD  verapamil (CALAN-SR) 180 MG CR tablet Take by mouth. 07/21/20   [provider]  vitamin E 180 MG (400 UNITS) capsule vitamin E 268 mg (400 unit) capsule   400 units by oral route.    [provider]     Allergies  Latex, Lidocaine, Atorvastatin, Losartan, Metronidazole, and Oxycodone   Family History  History reviewed. No pertinent family history.   Physical Exam  Triage Vital Signs: ED Triage Vitals  Enc Vitals Group     BP 05/23/22 2255 (!) 186/80     Pulse Rate 05/23/22 2255 90     Resp 05/23/22 2255 20     Temp 05/23/22 2258 98 F (36.7 C)      Temp Source 05/23/22 2258 Oral     SpO2 05/23/22 2255 91 %     Weight 05/23/22 2249 136 lb 11 oz (62 kg)     Height 05/23/22 2249 4\' 11"  (1.499 m)     Head Circumference --      Peak Flow --      Pain Score 05/23/22 2249 5     Pain Loc --      Pain Edu? --      Excl. in GC? --     Updated Vital Signs: BP (!) 170/81   Pulse 83   Temp 98 F (36.7 C) (Oral)   Resp 18   Ht 4\' 11"  (1.499 m)   Wt 62 kg   SpO2 97%   BMI 27.61 kg/m    General: Awake, moderate distress.  CV:  RRR.  Good peripheral perfusion. Resp:  Normal effort.  Diminished, otherwise CTAB.  Abd:  Nontender to light or deep palpation.  No distention.  Other:  2 cm curved bleeding forehead laceration noted with small arterial spurter.  Bilateral periorbital hematomas, left greater than right.  Able to pry open eyelids and visualize pupils and note they are PERRL.  EOMI.  No subconjunctival hemorrhage.  Clotted nasal blood.  No hemotympanum.  No dental malocclusion.  No midline cervical spine tenderness to palpation, step-offs or deformities.  Pelvis stable.  Full range of both hips without pain.   ED Results / Procedures / Treatments  Labs (all labs ordered are listed, but only abnormal results are displayed) Labs Reviewed  COMPREHENSIVE METABOLIC PANEL - Abnormal; Notable for the following components:      Result Value   Sodium 127 (*)    Chloride 91 (*)    Glucose, Bld 120 (*)    All other components within normal limits  CBC  PROTIME-INR  TROPONIN I (HIGH SENSITIVITY)     EKG  ED ECG REPORT I, Lashawn Bromwell J, the attending physician, personally viewed and interpreted this ECG.   Date: 05/24/2022  EKG Time: 2258  Rate: 84  Rhythm: normal sinus rhythm  Axis: Normal  Intervals:none  ST&T Change: Nonspecific    RADIOLOGY I have independently visualized and interpreted patient's x-ray and CT scans as well as noted the radiology interpretation:  CT head: No ICH  CT cervical spine: No acute osseous  injury  CT maxillofacial: No acute osseous injury  Chest x-ray: Chronic fibrotic changes  Official radiology report(s): DG Chest Port 1 View  Result Date: 05/24/2022 CLINICAL DATA:  Shortness of breath EXAM: PORTABLE CHEST 1 VIEW COMPARISON:  11/26/2021 FINDINGS: Cardiac shadow is within normal limits. Aortic calcifications are noted. Bibasilar fibrotic changes are again seen. No focal infiltrate or effusion is noted. No acute bony abnormality is seen. IMPRESSION: Chronic fibrotic changes in the bases. No other focal abnormality is noted. Electronically Signed   By: Alcide Clever M.D.   On: 05/24/2022 00:20   CT Head Wo Contrast  Result Date: 05/24/2022 CLINICAL DATA:  Recent fall with headaches neck and facial pain, initial encounter EXAM: CT HEAD WITHOUT CONTRAST CT MAXILLOFACIAL WITHOUT CONTRAST CT CERVICAL SPINE WITHOUT CONTRAST TECHNIQUE: Multidetector CT imaging of the head, cervical spine, and maxillofacial structures were performed using the standard protocol without intravenous contrast. Multiplanar CT image reconstructions of the cervical spine and maxillofacial structures were also generated. RADIATION DOSE REDUCTION: This exam was performed according to the departmental dose-optimization program which includes automated exposure control, adjustment of the mA and/or kV according to patient size and/or use of iterative reconstruction technique. COMPARISON:  None Available. FINDINGS: CT HEAD FINDINGS Brain: No evidence of acute infarction, hemorrhage, hydrocephalus, extra-axial collection or mass lesion/mass effect. Vascular: No hyperdense vessel or unexpected calcification. Skull: Normal. Negative for fracture or focal lesion. Other: None. CT MAXILLOFACIAL FINDINGS Osseous: No acute fracture or dislocation is noted. Considerable dental hardware is seen. Orbits: Orbits and their contents are within normal limits. Sinuses: Paranasal sinuses are well aerated. Soft tissues: Soft tissue edema is  noted about the orbits as well as the forehead bilaterally consistent with the recent injury. No focal hematoma is seen. CT CERVICAL SPINE FINDINGS Alignment: Within normal limits. Skull base and vertebrae: 7 cervical segments are well visualized. Vertebral body height is well maintained. Multilevel osteophytic changes are seen. Facet hypertrophic changes are noted. Pain is formation is noted posterior to the odontoid. No acute fracture or acute facet abnormality is seen. Soft tissues and  spinal canal: Surrounding soft tissue structures show diffuse vascular calcifications. No focal hematoma is seen. No other abnormality is noted. Upper chest: Visualized lung apices show mild scarring particularly in the right apex. Other: None IMPRESSION: CT of the head: No acute intracranial abnormality noted. CT of the maxillofacial bones: No acute fracture is noted. Considerable soft tissue swelling is noted in the forehead and about the orbits without focal hematoma. CT of the cervical spine: Multilevel degenerative change without acute abnormality. Electronically Signed   By: Alcide Clever M.D.   On: 05/24/2022 00:09   CT Cervical Spine Wo Contrast  Result Date: 05/24/2022 CLINICAL DATA:  Recent fall with headaches neck and facial pain, initial encounter EXAM: CT HEAD WITHOUT CONTRAST CT MAXILLOFACIAL WITHOUT CONTRAST CT CERVICAL SPINE WITHOUT CONTRAST TECHNIQUE: Multidetector CT imaging of the head, cervical spine, and maxillofacial structures were performed using the standard protocol without intravenous contrast. Multiplanar CT image reconstructions of the cervical spine and maxillofacial structures were also generated. RADIATION DOSE REDUCTION: This exam was performed according to the departmental dose-optimization program which includes automated exposure control, adjustment of the mA and/or kV according to patient size and/or use of iterative reconstruction technique. COMPARISON:  None Available. FINDINGS: CT HEAD  FINDINGS Brain: No evidence of acute infarction, hemorrhage, hydrocephalus, extra-axial collection or mass lesion/mass effect. Vascular: No hyperdense vessel or unexpected calcification. Skull: Normal. Negative for fracture or focal lesion. Other: None. CT MAXILLOFACIAL FINDINGS Osseous: No acute fracture or dislocation is noted. Considerable dental hardware is seen. Orbits: Orbits and their contents are within normal limits. Sinuses: Paranasal sinuses are well aerated. Soft tissues: Soft tissue edema is noted about the orbits as well as the forehead bilaterally consistent with the recent injury. No focal hematoma is seen. CT CERVICAL SPINE FINDINGS Alignment: Within normal limits. Skull base and vertebrae: 7 cervical segments are well visualized. Vertebral body height is well maintained. Multilevel osteophytic changes are seen. Facet hypertrophic changes are noted. Pain is formation is noted posterior to the odontoid. No acute fracture or acute facet abnormality is seen. Soft tissues and spinal canal: Surrounding soft tissue structures show diffuse vascular calcifications. No focal hematoma is seen. No other abnormality is noted. Upper chest: Visualized lung apices show mild scarring particularly in the right apex. Other: None IMPRESSION: CT of the head: No acute intracranial abnormality noted. CT of the maxillofacial bones: No acute fracture is noted. Considerable soft tissue swelling is noted in the forehead and about the orbits without focal hematoma. CT of the cervical spine: Multilevel degenerative change without acute abnormality. Electronically Signed   By: Alcide Clever M.D.   On: 05/24/2022 00:09   CT Maxillofacial WO CM  Result Date: 05/24/2022 CLINICAL DATA:  Recent fall with headaches neck and facial pain, initial encounter EXAM: CT HEAD WITHOUT CONTRAST CT MAXILLOFACIAL WITHOUT CONTRAST CT CERVICAL SPINE WITHOUT CONTRAST TECHNIQUE: Multidetector CT imaging of the head, cervical spine, and  maxillofacial structures were performed using the standard protocol without intravenous contrast. Multiplanar CT image reconstructions of the cervical spine and maxillofacial structures were also generated. RADIATION DOSE REDUCTION: This exam was performed according to the departmental dose-optimization program which includes automated exposure control, adjustment of the mA and/or kV according to patient size and/or use of iterative reconstruction technique. COMPARISON:  None Available. FINDINGS: CT HEAD FINDINGS Brain: No evidence of acute infarction, hemorrhage, hydrocephalus, extra-axial collection or mass lesion/mass effect. Vascular: No hyperdense vessel or unexpected calcification. Skull: Normal. Negative for fracture or focal lesion. Other: None. CT MAXILLOFACIAL FINDINGS  Osseous: No acute fracture or dislocation is noted. Considerable dental hardware is seen. Orbits: Orbits and their contents are within normal limits. Sinuses: Paranasal sinuses are well aerated. Soft tissues: Soft tissue edema is noted about the orbits as well as the forehead bilaterally consistent with the recent injury. No focal hematoma is seen. CT CERVICAL SPINE FINDINGS Alignment: Within normal limits. Skull base and vertebrae: 7 cervical segments are well visualized. Vertebral body height is well maintained. Multilevel osteophytic changes are seen. Facet hypertrophic changes are noted. Pain is formation is noted posterior to the odontoid. No acute fracture or acute facet abnormality is seen. Soft tissues and spinal canal: Surrounding soft tissue structures show diffuse vascular calcifications. No focal hematoma is seen. No other abnormality is noted. Upper chest: Visualized lung apices show mild scarring particularly in the right apex. Other: None IMPRESSION: CT of the head: No acute intracranial abnormality noted. CT of the maxillofacial bones: No acute fracture is noted. Considerable soft tissue swelling is noted in the forehead and  about the orbits without focal hematoma. CT of the cervical spine: Multilevel degenerative change without acute abnormality. Electronically Signed   By: Alcide Clever M.D.   On: 05/24/2022 00:09     PROCEDURES:  Critical Care performed: Yes, see critical care procedure note(s)  CRITICAL CARE Performed by: Irean Hong   Total critical care time: 30 minutes  Critical care time was exclusive of separately billable procedures and treating other patients.  Critical care was necessary to treat or prevent imminent or life-threatening deterioration.  Critical care was time spent personally by me on the following activities: development of treatment plan with patient and/or surrogate as well as nursing, discussions with consultants, evaluation of patient's response to treatment, examination of patient, obtaining history from patient or surrogate, ordering and performing treatments and interventions, ordering and review of laboratory studies, ordering and review of radiographic studies, pulse oximetry and re-evaluation of patient's condition.   Marland Kitchen1-3 Lead EKG Interpretation  Performed by: Irean Hong, MD Authorized by: Irean Hong, MD     Interpretation: normal     ECG rate:  80   ECG rate assessment: normal     Rhythm: sinus rhythm     Ectopy: none     Conduction: normal   Comments:     Patient placed on cardiac monitor to evaluate for arrhythmias .Marland KitchenLaceration Repair  Date/Time: 05/23/2022 11:46 PM  Performed by: Irean Hong, MD Authorized by: Irean Hong, MD   Consent:    Consent obtained:  Verbal   Consent given by:  Patient   Risks, benefits, and alternatives were discussed: yes   Anesthesia:    Anesthesia method:  Topical application   Topical anesthesia: PainEase. Laceration details:    Location:  Face   Face location:  Forehead   Length (cm):  2   Depth (mm):  6 Pre-procedure details:    Preparation:  Patient was prepped and draped in usual sterile fashion Treatment:     Area cleansed with:  Povidone-iodine   Amount of cleaning:  Standard   Irrigation solution:  Sterile saline   Irrigation method:  Tap   Visualized foreign bodies/material removed: no     Layers/structures repaired:  Deep subcutaneous Deep subcutaneous:    Suture size:  4-0   Suture material:  Vicryl   Suture technique:  Simple interrupted   Number of sutures:  2 Skin repair:    Repair method:  Sutures   Suture size:  3-0  Suture material:  Nylon   Suture technique:  Running   Number of sutures:  5 Approximation:    Approximation:  Close Repair type:    Repair type:  Intermediate Post-procedure details:    Dressing:  Bulky dressing   Procedure completion:  Tolerated well, no immediate complications Comments:     Surgical, nonstick dressing and Coban applied for pressure dressing    MEDICATIONS ORDERED IN ED: Medications  cephALEXin (KEFLEX) capsule 500 mg (has no administration in time range)  sodium chloride 0.9 % bolus 1,000 mL (1,000 mLs Intravenous New Bag/Given 05/24/22 0022)  acetaminophen (TYLENOL) tablet 1,000 mg (1,000 mg Oral Given 05/24/22 0035)     IMPRESSION / MDM / ASSESSMENT AND PLAN / ED COURSE  I reviewed the triage vital signs and the nursing notes.                             82 year old female who presents status post mechanical fall with uncontrolled forehead laceration, on Plavix.  Differential diagnosis includes but is not limited to ICH, SDH, cervical spine fracture, maxillofacial fracture, anemia secondary to blood loss, etc.  Personally reviewed patient's records and note an orthopedic office visit on 04/20/2022 for right knee osteoarthritis.  Patient's presentation is most consistent with acute presentation with potential threat to life or bodily function.  The patient is on the cardiac monitor to evaluate for evidence of arrhythmia and/or significant heart rate changes.  Laceration repaired urgently upon patient's arrival.  Will obtain basic  lab work, obtain CT head/C-spine/maxillofacial, initiate IV fluid resuscitation.  Ice applied to periorbital hematomas.  Will reassess.  Clinical Course as of 05/24/22 0051  Mon May 24, 2022  0034 Pressure dressing removed.  Hemostasis achieved.  Updated patient and spouse on all imaging and laboratory results.  She is mildly hyponatremic with sodium of 127.  Reapply ice to left periorbital hematoma.  Able to open right eye slightly.  Clotted nasal blood, currently mouth breathing with saturations on 2 L nasal cannula oxygen of 87%.  Will consult hospital services for evaluation and admission for observation for neurochecks.  Will also start Keflex for wound prophylaxis. [JS]  0050 Patient hypoxic to 87% on 2 L nasal cannula oxygen.  Does not improve tremendously when oxygen increased to 4 L.  Clotted nasal blood in both nares, patient is open mouth breathing.  Will ask RT to place humidified Ventimask. [JS]    Clinical Course User Index [JS] Irean Hong, MD     FINAL CLINICAL IMPRESSION(S) / ED DIAGNOSES   Final diagnoses:  Injury of head, initial encounter  Contusion of face, initial encounter  Fall in home, initial encounter  Facial laceration, initial encounter  Hyponatremia  Periorbital hematoma of both eyes  Hypoxia     Rx / DC Orders   ED Discharge Orders     None        Note:  This document was prepared using Dragon voice recognition software and may include unintentional dictation errors.   Irean Hong, MD 05/24/22 [phone]

## 2022-05-24 ENCOUNTER — Inpatient Hospital Stay: Payer: Medicare Other

## 2022-05-24 ENCOUNTER — Emergency Department: Payer: Medicare Other

## 2022-05-24 DIAGNOSIS — S0181XA Laceration without foreign body of other part of head, initial encounter: Secondary | ICD-10-CM

## 2022-05-24 DIAGNOSIS — S0993XA Unspecified injury of face, initial encounter: Secondary | ICD-10-CM | POA: Insufficient documentation

## 2022-05-24 DIAGNOSIS — W19XXXA Unspecified fall, initial encounter: Secondary | ICD-10-CM | POA: Diagnosis present

## 2022-05-24 DIAGNOSIS — I10 Essential (primary) hypertension: Secondary | ICD-10-CM | POA: Diagnosis not present

## 2022-05-24 DIAGNOSIS — I428 Other cardiomyopathies: Secondary | ICD-10-CM

## 2022-05-24 LAB — PHOSPHORUS: Phosphorus: 3.9 mg/dL (ref 2.5–4.6)

## 2022-05-24 LAB — CBC
HCT: 31 % — ABNORMAL LOW (ref 36.0–46.0)
Hemoglobin: 10.8 g/dL — ABNORMAL LOW (ref 12.0–15.0)
MCH: 29.9 pg (ref 26.0–34.0)
MCHC: 34.8 g/dL (ref 30.0–36.0)
MCV: 85.9 fL (ref 80.0–100.0)
Platelets: 253 10*3/uL (ref 150–400)
RBC: 3.61 MIL/uL — ABNORMAL LOW (ref 3.87–5.11)
RDW: 12.8 % (ref 11.5–15.5)
WBC: 13.4 10*3/uL — ABNORMAL HIGH (ref 4.0–10.5)
nRBC: 0 % (ref 0.0–0.2)

## 2022-05-24 LAB — BASIC METABOLIC PANEL
Anion gap: 10 (ref 5–15)
BUN: 20 mg/dL (ref 8–23)
CO2: 24 mmol/L (ref 22–32)
Calcium: 8.4 mg/dL — ABNORMAL LOW (ref 8.9–10.3)
Chloride: 94 mmol/L — ABNORMAL LOW (ref 98–111)
Creatinine, Ser: 0.68 mg/dL (ref 0.44–1.00)
GFR, Estimated: >60 mL/min (ref >60)
Glucose, Bld: 148 mg/dL — ABNORMAL HIGH (ref 70–99)
Potassium: 3.9 mmol/L (ref 3.5–5.1)
Sodium: 128 mmol/L — ABNORMAL LOW (ref 135–145)

## 2022-05-24 LAB — MAGNESIUM: Magnesium: 1.7 mg/dL (ref 1.7–2.4)

## 2022-05-24 LAB — TROPONIN I (HIGH SENSITIVITY): Troponin I (High Sensitivity): 13 ng/L (ref <18)

## 2022-05-24 MED ORDER — CYANOCOBALAMIN 500 MCG PO TABS
1000.0000 ug | ORAL_TABLET | Freq: Every day | ORAL | Status: DC
  Administered 2022-05-24: 1000 ug via ORAL
  Filled 2022-05-24: qty 2

## 2022-05-24 MED ORDER — ATORVASTATIN CALCIUM 20 MG PO TABS
40.0000 mg | ORAL_TABLET | Freq: Every day | ORAL | Status: DC
  Administered 2022-05-24: 40 mg via ORAL
  Filled 2022-05-24: qty 2

## 2022-05-24 MED ORDER — PANTOPRAZOLE SODIUM 40 MG PO TBEC
40.0000 mg | DELAYED_RELEASE_TABLET | Freq: Every day | ORAL | Status: DC
  Administered 2022-05-24: 40 mg via ORAL
  Filled 2022-05-24: qty 1

## 2022-05-24 MED ORDER — VERAPAMIL HCL ER 180 MG PO TBCR: 180.0000 mg | EXTENDED_RELEASE_TABLET | Freq: Every day | ORAL | Status: DC

## 2022-05-24 MED ORDER — OMEGA-3-ACID ETHYL ESTERS 1 G PO CAPS
1.0000 g | ORAL_CAPSULE | Freq: Every day | ORAL | Status: DC
  Administered 2022-05-24: 1 g via ORAL
  Filled 2022-05-24: qty 1

## 2022-05-24 MED ORDER — PROCHLORPERAZINE EDISYLATE 10 MG/2ML IJ SOLN: 5.0000 mg | Freq: Four times a day (QID) | INTRAMUSCULAR | Status: DC | PRN

## 2022-05-24 MED ORDER — HYDROMORPHONE HCL 1 MG/ML IJ SOLN: 0.5000 mg | INTRAMUSCULAR | Status: DC | PRN

## 2022-05-24 MED ORDER — CEPHALEXIN 500 MG PO CAPS
500.0000 mg | ORAL_CAPSULE | Freq: Once | ORAL | Status: AC
  Administered 2022-05-24: 500 mg via ORAL
  Filled 2022-05-24: qty 1

## 2022-05-24 MED ORDER — MELATONIN 5 MG PO TABS: 5.0000 mg | ORAL_TABLET | Freq: Every evening | ORAL | Status: DC | PRN

## 2022-05-24 MED ORDER — ACETAMINOPHEN 325 MG PO TABS: 650.0000 mg | ORAL_TABLET | Freq: Four times a day (QID) | ORAL | Status: DC | PRN

## 2022-05-24 MED ORDER — BACITRACIN ZINC 500 UNIT/GM EX OINT
TOPICAL_OINTMENT | CUTANEOUS | Status: AC
  Filled 2022-05-24: qty 0.9

## 2022-05-24 MED ORDER — POLYETHYLENE GLYCOL 3350 17 G PO PACK: 17.0000 g | PACK | Freq: Every day | ORAL | Status: DC | PRN

## 2022-05-24 MED ORDER — OXYCODONE HCL 5 MG PO TABS: 5.0000 mg | ORAL_TABLET | ORAL | Status: DC | PRN

## 2022-05-24 MED ORDER — LOSARTAN POTASSIUM 50 MG PO TABS
50.0000 mg | ORAL_TABLET | Freq: Every day | ORAL | Status: DC
  Administered 2022-05-24: 50 mg via ORAL
  Filled 2022-05-24: qty 1

## 2022-05-24 MED ORDER — PROSIGHT PO TABS
1.0000 | ORAL_TABLET | Freq: Every day | ORAL | Status: DC
  Administered 2022-05-24: 1 via ORAL
  Filled 2022-05-24: qty 1

## 2022-05-24 MED ORDER — CLONIDINE HCL 0.1 MG PO TABS
0.2000 mg | ORAL_TABLET | Freq: Two times a day (BID) | ORAL | Status: DC
  Administered 2022-05-24: 0.2 mg via ORAL
  Filled 2022-05-24: qty 2

## 2022-05-24 MED ORDER — ACETAMINOPHEN 500 MG PO TABS
1000.0000 mg | ORAL_TABLET | Freq: Once | ORAL | Status: AC
  Administered 2022-05-24: 1000 mg via ORAL
  Filled 2022-05-24: qty 2

## 2022-05-24 NOTE — ED Notes (Signed)
RN dressed pt laceration/stitches with ointment and guaze. No bleeding noted at this time. Stitches intact. Pt given instructions on suture care at home. Pt denies any questions or concerns at this time.

## 2022-05-24 NOTE — Care Management CC44 (Signed)
Condition Code 44 Documentation Completed  Patient Details  Name: Susan Sosa MRN: [id number] Date of Birth: 1940/08/15   Condition Code 44 given:  Yes Patient signature on Condition Code 44 notice:  Yes Documentation of 2 MD's agreement:  Yes Code 44 added to claim:  Yes    Margarito Liner, LCSW 05/24/2022, 12:44 PM

## 2022-05-24 NOTE — Care Management Obs Status (Signed)
MEDICARE OBSERVATION STATUS NOTIFICATION   Patient Details  Name: Susan Sosa MRN: [id number] Date of Birth: 1940-09-19   Medicare Observation Status Notification Given:  Yes    Margarito Liner, LCSW 05/24/2022, 12:44 PM

## 2022-05-24 NOTE — Discharge Summary (Signed)
Susan Sosa ZOX:[id number] DOB: [birth year] DOA: 05/23/2022  PCP: Marisue Ivan, MD  Admit date: 05/23/2022 Discharge date: 05/24/2022  Time spent: 35 minutes  Recommendations for Outpatient Follow-up:  Pcp f/u 1 week for evaluation of wound and likely suture removal     Discharge Diagnoses:  Principal Problem:   Forehead laceration, initial encounter Active Problems:   NSTEMI (non-ST elevated myocardial infarction) (HCC)   Benign essential hypertension   Idiopathic pulmonary fibrosis (HCC)   Chronic respiratory failure with hypoxia (HCC)   Fall   NICM (nonischemic cardiomyopathy) (HCC)   Facial trauma   Discharge Condition: stable  Diet recommendation: heart healthy  Filed Weights   05/23/22 2249  Weight: 62 kg    History of present illness:  Susan Sosa is a 82 y.o. female with medical history significant for essential hypertension, paroxysmal atrial tachycardia, coronary artery disease on aspirin Plavix and Lipitor, history of NSTEMI (November 2023), last cardiac cath less than 6 months ago, hyperlipidemia, idiopathic pulmonary fibrosis, chronic respiratory failure with hypoxia on 2 L nasal cannula at baseline, chronic hyponatremia, moderate nonischemic dilated cardiomyopathy, who presented to [facility] ED after a mechanical fall at home.  The patient was walking to the bathroom with her oxygen when she tripped on her oxygen tubing and fell face forward.  No loss of consciousness.   In the ED, she was noted to have a laceration with a small arterial bleed, repaired and controlled by EDP.  Physical exam also notable for bilateral periorbital hematoma left greater than right.  CT head, maxillofacial, and neck with no evidence of acute fracture.  O2 saturation dropped in the ED to 87% on her baseline 2 L [place], oxygen supplementation was increased to 4 L with improvement of her saturations to the low 90s.  Chest x-ray was non acute.   Last doses of aspirin and Plavix were taken on  05/23/2022 a.m.  EDP requested admission for further management of her facial injuries.  Admitted by [facility], hospitalist service.  Hospital Course:  Here with fall and facial trauma after she tripped over home oxygen tubing. Laceration to forehead and bruising around the eyes. CT head/neck/maxillofacial negative. Also with bruise to right shoulder, no pain and normal rom, no fracture on x-ray. The laceration was suture repaired with vicryl by the ED provider. Patient was admitted "for further management of her facial injuries" but case reviewed with ENT provider Dr. Elenore Rota who states no additional management is needed. Cardiology was asked about continuing aspirin/plavix as recent LHC showed no significant CAD and they shared asa/plavix can be stopped. Patient will need PCP f/u in 7 days for evaluation and likely suture removal. Wound care instructions given.   Procedures: none   Consultations: No formal consults  Discharge Exam: Vitals:   05/24/22 0930 05/24/22 1057  BP: (!) 141/67   Pulse: 78   Resp: (!) 25   Temp:    SpO2: 100% 95%    General: NAD Cardiovascular: RRR Respiratory: CTAB Skin: bruising around eyes, laceration to forehead, bruise right anterior shoulder  Discharge Instructions   Discharge Instructions     Diet - low sodium heart healthy   Complete by: As directed    Discharge wound care:   Complete by: As directed    Keep clean and apply vaseline once a day   Increase activity slowly   Complete by: As directed       Allergies as of 05/24/2022       Reactions   Latex Itching, Rash  Lidocaine Swelling   Facial swelling from a dentist Facial swelling from a dentist Facial swelling from a dentist   Atorvastatin    Severe hip pain   Losartan Swelling   Metronidazole Other (See Comments)   Sore joints Sore joints   Oxycodone Nausea Only        Medication List     STOP taking these medications    aspirin 81 MG chewable tablet   clopidogrel 75  MG tablet Commonly known as: PLAVIX   triamterene-hydrochlorothiazide 75-50 MG tablet Commonly known as: MAXZIDE       TAKE these medications    albuterol (2.5 MG/3ML) 0.083% nebulizer solution Commonly known as: PROVENTIL Inhale into the lungs.   ascorbic acid 500 MG tablet Commonly known as: VITAMIN C ascorbic acid (vitamin C) 500 mg tablet   1  by oral route.   atorvastatin 40 MG tablet Commonly known as: LIPITOR Take 1 tablet by mouth daily.   cloNIDine 0.2 MG tablet Commonly known as: CATAPRES Take 0.2 mg by mouth 2 (two) times daily.   cyanocobalamin 1000 MCG tablet Take 1,000 mcg by mouth daily.   Esbriet 267 MG Caps Generic drug: Pirfenidone Take 267 mg by mouth with breakfast, with lunch, and with evening meal.   esomeprazole 40 MG capsule Commonly known as: NEXIUM Take 1 capsule by mouth at bedtime.   FIBER-CAPS PO Take by mouth.   Fish Oil 1200 MG Caps Take 1 capsule by mouth daily.   furosemide 20 MG tablet Commonly known as: LASIX Take 1 tablet by mouth daily.   glucosamine-chondroitin 500-400 MG tablet Take 1 tablet by mouth daily.   losartan 50 MG tablet Commonly known as: COZAAR Take 50 mg by mouth daily.   nitroGLYCERIN 0.4 MG SL tablet Commonly known as: NITROSTAT Place 1 tablet (0.4 mg total) under the tongue every 5 (five) minutes as needed for chest pain.   ondansetron 8 MG tablet Commonly known as: ZOFRAN TAKE 1 TABLET BY MOUTH EVERY 12 HOURS AS NEEDED FOR NAUSEA.   OXYGEN Inhale 2 L into the lungs continuous.   polyethylene glycol powder 17 GM/SCOOP powder Commonly known as: GLYCOLAX/MIRALAX 1 capful once a day , may use 2 per day if continued constipation   prednisoLONE acetate 1 % ophthalmic suspension Commonly known as: PRED FORTE prednisolone acetate 1 % eye drops,suspension  INSTILL 1 DROP INTO LEFT EYE 3 TIMES A DAY   PreserVision/Lutein Caps Take 1 capsule by mouth daily.   psyllium 0.52 g capsule Commonly  known as: REGULOID Take 0.52 g by mouth daily.   sodium chloride 5 % ophthalmic solution Commonly known as: MURO 128 Place 1 drop into the right eye at bedtime.   triamcinolone cream 0.1 % Commonly known as: KENALOG Apply 1 Application topically 2 (two) times daily as needed.   verapamil 180 MG CR tablet Commonly known as: CALAN-SR Take by mouth.   Vitamin D (Ergocalciferol) 1.25 MG (50000 UNIT) Caps capsule Commonly known as: DRISDOL Take 50,000 Units by mouth daily.   vitamin E 180 MG (400 UNITS) capsule vitamin E 268 mg (400 unit) capsule   400 units by oral route.               Discharge Care Instructions  (From admission, onward)           Start     Ordered   05/24/22 0000  Discharge wound care:       Comments: Keep clean and apply vaseline once a  day   05/24/22 1225           Allergies  Allergen Reactions   Latex Itching and Rash   Lidocaine Swelling    Facial swelling from a dentist Facial swelling from a dentist Facial swelling from a dentist    Atorvastatin     Severe hip pain   Losartan Swelling   Metronidazole Other (See Comments)    Sore joints Sore joints    Oxycodone Nausea Only    Follow-up Information     Marisue Ivan, MD Follow up.   Specialty: Family Medicine Why: in 7 days for evaluation and likely suture removal Contact information: [address] [facility] [place] [address] [phone]                  The results of significant diagnostics from this hospitalization (including imaging, microbiology, ancillary and laboratory) are listed below for reference.    Significant Diagnostic Studies: DG Shoulder Right  Result Date: 05/24/2022 CLINICAL DATA:  Pain after fall EXAM: RIGHT SHOULDER - 3 VIEW COMPARISON:  None Available. FINDINGS: Surgical Burrs along the humeral head. Densities along the area of the rotator cuff. Possible calcific tendinitis. Small osteophytes along the  glenohumeral joint. Joint space loss with osteophytes along the [facility] joint with some adjacent hypertrophy. No acute fracture or dislocation. Osteopenia IMPRESSION: Postsurgical changes identified along the humeral head. Degenerative changes seen elsewhere about the shoulder greatest of the [facility] joint with some chondrocalcinosis Electronically Signed   By: Karen Kays M.D.   On: 05/24/2022 10:38   DG Chest Port 1 View  Result Date: 05/24/2022 CLINICAL DATA:  Shortness of breath EXAM: PORTABLE CHEST 1 VIEW COMPARISON:  11/26/2021 FINDINGS: Cardiac shadow is within normal limits. Aortic calcifications are noted. Bibasilar fibrotic changes are again seen. No focal infiltrate or effusion is noted. No acute bony abnormality is seen. IMPRESSION: Chronic fibrotic changes in the bases. No other focal abnormality is noted. Electronically Signed   By: Alcide Clever M.D.   On: 05/24/2022 00:20   CT Head Wo Contrast  Result Date: 05/24/2022 CLINICAL DATA:  Recent fall with headaches neck and facial pain, initial encounter EXAM: CT HEAD WITHOUT CONTRAST CT MAXILLOFACIAL WITHOUT CONTRAST CT CERVICAL SPINE WITHOUT CONTRAST TECHNIQUE: Multidetector CT imaging of the head, cervical spine, and maxillofacial structures were performed using the standard protocol without intravenous contrast. Multiplanar CT image reconstructions of the cervical spine and maxillofacial structures were also generated. RADIATION DOSE REDUCTION: This exam was performed according to the departmental dose-optimization program which includes automated exposure control, adjustment of the mA and/or kV according to patient size and/or use of iterative reconstruction technique. COMPARISON:  None Available. FINDINGS: CT HEAD FINDINGS Brain: No evidence of acute infarction, hemorrhage, hydrocephalus, extra-axial collection or mass lesion/mass effect. Vascular: No hyperdense vessel or unexpected calcification. Skull: Normal. Negative for fracture or focal lesion.  Other: None. CT MAXILLOFACIAL FINDINGS Osseous: No acute fracture or dislocation is noted. Considerable dental hardware is seen. Orbits: Orbits and their contents are within normal limits. Sinuses: Paranasal sinuses are well aerated. Soft tissues: Soft tissue edema is noted about the orbits as well as the forehead bilaterally consistent with the recent injury. No focal hematoma is seen. CT CERVICAL SPINE FINDINGS Alignment: Within normal limits. Skull base and vertebrae: 7 cervical segments are well visualized. Vertebral body height is well maintained. Multilevel osteophytic changes are seen. Facet hypertrophic changes are noted. Pain is formation is noted posterior to the odontoid. No acute fracture  or acute facet abnormality is seen. Soft tissues and spinal canal: Surrounding soft tissue structures show diffuse vascular calcifications. No focal hematoma is seen. No other abnormality is noted. Upper chest: Visualized lung apices show mild scarring particularly in the right apex. Other: None IMPRESSION: CT of the head: No acute intracranial abnormality noted. CT of the maxillofacial bones: No acute fracture is noted. Considerable soft tissue swelling is noted in the forehead and about the orbits without focal hematoma. CT of the cervical spine: Multilevel degenerative change without acute abnormality. Electronically Signed   By: Alcide Clever M.D.   On: 05/24/2022 00:09   CT Cervical Spine Wo Contrast  Result Date: 05/24/2022 CLINICAL DATA:  Recent fall with headaches neck and facial pain, initial encounter EXAM: CT HEAD WITHOUT CONTRAST CT MAXILLOFACIAL WITHOUT CONTRAST CT CERVICAL SPINE WITHOUT CONTRAST TECHNIQUE: Multidetector CT imaging of the head, cervical spine, and maxillofacial structures were performed using the standard protocol without intravenous contrast. Multiplanar CT image reconstructions of the cervical spine and maxillofacial structures were also generated. RADIATION DOSE REDUCTION: This exam  was performed according to the departmental dose-optimization program which includes automated exposure control, adjustment of the mA and/or kV according to patient size and/or use of iterative reconstruction technique. COMPARISON:  None Available. FINDINGS: CT HEAD FINDINGS Brain: No evidence of acute infarction, hemorrhage, hydrocephalus, extra-axial collection or mass lesion/mass effect. Vascular: No hyperdense vessel or unexpected calcification. Skull: Normal. Negative for fracture or focal lesion. Other: None. CT MAXILLOFACIAL FINDINGS Osseous: No acute fracture or dislocation is noted. Considerable dental hardware is seen. Orbits: Orbits and their contents are within normal limits. Sinuses: Paranasal sinuses are well aerated. Soft tissues: Soft tissue edema is noted about the orbits as well as the forehead bilaterally consistent with the recent injury. No focal hematoma is seen. CT CERVICAL SPINE FINDINGS Alignment: Within normal limits. Skull base and vertebrae: 7 cervical segments are well visualized. Vertebral body height is well maintained. Multilevel osteophytic changes are seen. Facet hypertrophic changes are noted. Pain is formation is noted posterior to the odontoid. No acute fracture or acute facet abnormality is seen. Soft tissues and spinal canal: Surrounding soft tissue structures show diffuse vascular calcifications. No focal hematoma is seen. No other abnormality is noted. Upper chest: Visualized lung apices show mild scarring particularly in the right apex. Other: None IMPRESSION: CT of the head: No acute intracranial abnormality noted. CT of the maxillofacial bones: No acute fracture is noted. Considerable soft tissue swelling is noted in the forehead and about the orbits without focal hematoma. CT of the cervical spine: Multilevel degenerative change without acute abnormality. Electronically Signed   By: Alcide Clever M.D.   On: 05/24/2022 00:09   CT Maxillofacial WO CM  Result Date:  05/24/2022 CLINICAL DATA:  Recent fall with headaches neck and facial pain, initial encounter EXAM: CT HEAD WITHOUT CONTRAST CT MAXILLOFACIAL WITHOUT CONTRAST CT CERVICAL SPINE WITHOUT CONTRAST TECHNIQUE: Multidetector CT imaging of the head, cervical spine, and maxillofacial structures were performed using the standard protocol without intravenous contrast. Multiplanar CT image reconstructions of the cervical spine and maxillofacial structures were also generated. RADIATION DOSE REDUCTION: This exam was performed according to the departmental dose-optimization program which includes automated exposure control, adjustment of the mA and/or kV according to patient size and/or use of iterative reconstruction technique. COMPARISON:  None Available. FINDINGS: CT HEAD FINDINGS Brain: No evidence of acute infarction, hemorrhage, hydrocephalus, extra-axial collection or mass lesion/mass effect. Vascular: No hyperdense vessel or unexpected calcification. Skull: Normal. Negative for  fracture or focal lesion. Other: None. CT MAXILLOFACIAL FINDINGS Osseous: No acute fracture or dislocation is noted. Considerable dental hardware is seen. Orbits: Orbits and their contents are within normal limits. Sinuses: Paranasal sinuses are well aerated. Soft tissues: Soft tissue edema is noted about the orbits as well as the forehead bilaterally consistent with the recent injury. No focal hematoma is seen. CT CERVICAL SPINE FINDINGS Alignment: Within normal limits. Skull base and vertebrae: 7 cervical segments are well visualized. Vertebral body height is well maintained. Multilevel osteophytic changes are seen. Facet hypertrophic changes are noted. Pain is formation is noted posterior to the odontoid. No acute fracture or acute facet abnormality is seen. Soft tissues and spinal canal: Surrounding soft tissue structures show diffuse vascular calcifications. No focal hematoma is seen. No other abnormality is noted. Upper chest: Visualized  lung apices show mild scarring particularly in the right apex. Other: None IMPRESSION: CT of the head: No acute intracranial abnormality noted. CT of the maxillofacial bones: No acute fracture is noted. Considerable soft tissue swelling is noted in the forehead and about the orbits without focal hematoma. CT of the cervical spine: Multilevel degenerative change without acute abnormality. Electronically Signed   By: Alcide Clever M.D.   On: 05/24/2022 00:09    Microbiology: No results found for this or any previous visit (from the past 240 hour(s)).   Labs: Basic Metabolic Panel: Recent Labs  Lab 05/23/22 2307 05/24/22 0440  NA 127* 128*  K 4.1 3.9  CL 91* 94*  CO2 29 24  GLUCOSE 120* 148*  BUN 23 20  CREATININE 0.67 0.68  CALCIUM 9.0 8.4*  MG  --  1.7  PHOS  --  3.9   Liver Function Tests: Recent Labs  Lab 05/23/22 2307  AST 29  ALT 24  ALKPHOS 102  BILITOT 0.6  PROT 7.4  ALBUMIN 3.9   No results for input(s): "LIPASE", "AMYLASE" in the last 168 hours. No results for input(s): "AMMONIA" in the last 168 hours. CBC: Recent Labs  Lab 05/23/22 2307 05/24/22 0440  WBC 8.5 13.4*  HGB 12.5 10.8*  HCT 36.2 31.0*  MCV 85.6 85.9  PLT 286 253   Cardiac Enzymes: No results for input(s): "CKTOTAL", "CKMB", "CKMBINDEX", "TROPONINI" in the last 168 hours. BNP: BNP (last 3 results) No results for input(s): "BNP" in the last 8760 hours.  ProBNP (last 3 results) No results for input(s): "PROBNP" in the last 8760 hours.  CBG: No results for input(s): "GLUCAP" in the last 168 hours.     Signed:  Silvano Bilis MD.  [facility] 05/24/2022, 12:25 PM

## 2022-05-24 NOTE — Progress Notes (Signed)
Placed patient on 40% face tent/bucket at 10 liters to aid with relief of facial swelling due to fall.

## 2022-05-24 NOTE — H&P (Addendum)
History and Physical  Susan Sosa ZOX:[id number] DOB: 10-08-1940 DOA: 05/23/2022  Referring physician: Dr. Dolores Frame, EDP  PCP: Marisue Ivan, MD  Outpatient Specialists: Cardiology, Pulmonology Patient coming from: Home  Chief Complaint: Fall.  HPI: Susan Sosa is a 82 y.o. female with medical history significant for essential hypertension, paroxysmal atrial tachycardia, coronary artery disease on aspirin Plavix and Lipitor, history of NSTEMI (November 2023), last cardiac cath less than 6 months ago, hyperlipidemia, idiopathic pulmonary fibrosis, chronic respiratory failure with hypoxia on 2 L nasal cannula at baseline, chronic hyponatremia, moderate nonischemic dilated cardiomyopathy, who presented to [facility] ED after a mechanical fall at home.  The patient was walking to the bathroom with her oxygen when she tripped on her oxygen tubing and fell face forward.  No loss of consciousness.  In the ED, she was noted to have a laceration with a small arterial bleed, repaired and controlled by EDP.  Physical exam also notable for bilateral periorbital hematoma left greater than right.  CT head, maxillofacial, and neck with no evidence of acute fracture.  O2 saturation dropped in the ED to 87% on her baseline 2 L [place], oxygen supplementation was increased to 4 L with improvement of her saturations to the low 90s.  Chest x-ray was non acute.  Last doses of aspirin and Plavix were taken on 05/23/2022 a.m.  EDP requested admission for further management of her facial injuries.  Admitted by [facility], hospitalist service.  ED Course: Tmax 98.  BP 183/87, pulse 89, respiratory 18, saturation 93% on 4 L.  Lab studies remarkable for serum sodium 127, glucose 120.  High-sensitivity troponin 4, repeat 13.  CBC essentially unremarkable.  Review of Systems: Review of systems as noted in the HPI. All other systems reviewed and are negative.   Past Medical History:  Diagnosis Date   Arthritis    Heart murmur     Hyperlipidemia    Hypertension    Hyponatremia    IPF (idiopathic pulmonary fibrosis) (HCC)    Past Surgical History:  Procedure Laterality Date   LEFT HEART CATH AND CORONARY ANGIOGRAPHY N/A 11/27/2021   Procedure: LEFT HEART CATH AND CORONARY ANGIOGRAPHY and possible pci and stent;  Surgeon: Alwyn Pea, MD;  Location: [facility] INVASIVE CV LAB;  Service: Cardiovascular;  Laterality: N/A;    Social History:  reports that she has quit smoking. She has never used smokeless tobacco. She reports that she does not drink alcohol and does not use drugs.   Allergies  Allergen Reactions   Latex Itching and Rash   Lidocaine Swelling    Facial swelling from a dentist Facial swelling from a dentist Facial swelling from a dentist    Atorvastatin     Severe hip pain   Losartan Swelling   Metronidazole Other (See Comments)    Sore joints Sore joints    Oxycodone Nausea Only    History reviewed. No pertinent family history.    Prior to Admission medications   Medication Sig Start Date End Date Taking? Authorizing Provider  albuterol (PROVENTIL) (2.5 MG/3ML) 0.083% nebulizer solution Inhale into the lungs. 11/13/19  Yes [provider]  ascorbic acid (VITAMIN C) 500 MG tablet ascorbic acid (vitamin C) 500 mg tablet   1  by oral route. 05/08/21  Yes Campbell Lerner, MD  aspirin 81 MG chewable tablet Chew 81 mg by mouth daily.   Yes [provider]  atorvastatin (LIPITOR) 40 MG tablet Take 1 tablet by mouth daily. 06/27/20  Yes [provider]  cloNIDine (CATAPRES) 0.2 MG tablet Take 0.2 mg by mouth 2 (two) times daily.   Yes [provider]  clopidogrel (PLAVIX) 75 MG tablet Take 1 tablet (75 mg total) by mouth daily with breakfast. 11/29/21  Yes Arnetha Courser, MD  cyanocobalamin 1000 MCG tablet Take 1,000 mcg by mouth daily.   Yes [provider]  esomeprazole (NEXIUM) 40 MG capsule Take 1 capsule by mouth at bedtime. 05/30/20  Yes [provider]  furosemide (LASIX) 20 MG tablet Take 1 tablet by mouth daily. 09/11/21  Yes [provider]  glucosamine-chondroitin 500-400 MG tablet Take 1 tablet by mouth daily.   Yes [provider]  losartan (COZAAR) 50 MG tablet Take 50 mg by mouth daily.   Yes [provider]  Multiple Vitamins-Minerals (PRESERVISION/LUTEIN) CAPS Take 1 capsule by mouth daily.   Yes [provider]  nitroGLYCERIN (NITROSTAT) 0.4 MG SL tablet Place 1 tablet (0.4 mg total) under the tongue every 5 (five) minutes as needed for chest pain. 11/28/21  Yes Arnetha Courser, MD  Omega-3 Fatty Acids (FISH OIL) 1200 MG CAPS Take 1 capsule by mouth daily.   Yes [provider]  ondansetron (ZOFRAN) 8 MG tablet TAKE 1 TABLET BY MOUTH EVERY 12 HOURS AS NEEDED FOR NAUSEA. 09/29/21  Yes [provider]  OXYGEN Inhale 2 L into the lungs continuous.   Yes [provider]  Pirfenidone (ESBRIET) 267 MG CAPS Take 267 mg by mouth with breakfast, with lunch, and with evening meal. 11/21/19  Yes [provider]  polyethylene glycol powder (GLYCOLAX/MIRALAX) 17 GM/SCOOP powder 1 capful once a day , may use 2 per day if continued constipation 04/22/21  Yes Fisher, Roselyn Bering, PA-C  prednisoLONE acetate (PRED FORTE) 1 % ophthalmic suspension prednisolone acetate 1 % eye drops,suspension  INSTILL 1 DROP INTO LEFT EYE 3 TIMES A DAY   Yes [provider]  psyllium (REGULOID) 0.52 g capsule Take 0.52 g by mouth daily.   Yes [provider]  sodium chloride (MURO 128) 5 % ophthalmic solution Place 1 drop into the right eye at bedtime.   Yes [provider]  triamcinolone cream (KENALOG) 0.1 % Apply 1 Application topically 2 (two) times daily as needed. 08/11/21  Yes [provider]  verapamil (CALAN-SR) 180 MG CR tablet Take by mouth. 07/21/20  Yes [provider]  Vitamin D, Ergocalciferol, (DRISDOL) 1.25 MG (50000 UNIT) CAPS capsule  Take 50,000 Units by mouth daily.   Yes [provider]  vitamin E 180 MG (400 UNITS) capsule vitamin E 268 mg (400 unit) capsule   400 units by oral route.   Yes [provider]  Calcium Polycarbophil (FIBER-CAPS PO) Take by mouth.    [provider]  triamterene-hydrochlorothiazide (MAXZIDE) 75-50 MG tablet Take 1 tablet by mouth daily. Hold until you see your cardiologist Patient not taking: Reported on 05/24/2022 11/28/21   Arnetha Courser, MD    Physical Exam: BP (!) 170/81   Pulse 83   Temp 98 F (36.7 C) (Oral)   Resp 18   Ht 4\' 11"  (1.499 m)   Wt 62 kg   SpO2 97%   BMI 27.61 kg/m   General: 82 y.o. year-old female well developed well nourished in no acute distress.  Alert and oriented x3.  Facial bruising, bilateral periorbital hematoma left greater than right from trauma. Cardiovascular: Regular rate and rhythm with no rubs or gallops.  No thyromegaly or JVD noted.  No lower extremity  edema. 2/4 pulses in all 4 extremities. Respiratory: Clear to auscultation with no wheezes or rales. Good inspiratory effort. Abdomen: Soft nontender nondistended with normal bowel sounds x4 quadrants. Muskuloskeletal: No cyanosis, clubbing or edema noted bilaterally Neuro: CN II-XII intact, strength, sensation, reflexes Skin: No ulcerative lesions noted or rashes Psychiatry: Judgement and insight appear normal. Mood is appropriate for condition and setting          Labs on Admission:  Basic Metabolic Panel: Recent Labs  Lab 05/23/22 2307  NA 127*  K 4.1  CL 91*  CO2 29  GLUCOSE 120*  BUN 23  CREATININE 0.67  CALCIUM 9.0   Liver Function Tests: Recent Labs  Lab 05/23/22 2307  AST 29  ALT 24  ALKPHOS 102  BILITOT 0.6  PROT 7.4  ALBUMIN 3.9   No results for input(s): "LIPASE", "AMYLASE" in the last 168 hours. No results for input(s): "AMMONIA" in the last 168 hours. CBC: Recent Labs  Lab 05/23/22 2307  WBC 8.5  HGB 12.5  HCT 36.2  MCV 85.6   PLT 286   Cardiac Enzymes: No results for input(s): "CKTOTAL", "CKMB", "CKMBINDEX", "TROPONINI" in the last 168 hours.  BNP (last 3 results) No results for input(s): "BNP" in the last 8760 hours.  ProBNP (last 3 results) No results for input(s): "PROBNP" in the last 8760 hours.  CBG: No results for input(s): "GLUCAP" in the last 168 hours.  Radiological Exams on Admission: DG Chest Port 1 View  Result Date: 05/24/2022 CLINICAL DATA:  Shortness of breath EXAM: PORTABLE CHEST 1 VIEW COMPARISON:  11/26/2021 FINDINGS: Cardiac shadow is within normal limits. Aortic calcifications are noted. Bibasilar fibrotic changes are again seen. No focal infiltrate or effusion is noted. No acute bony abnormality is seen. IMPRESSION: Chronic fibrotic changes in the bases. No other focal abnormality is noted. Electronically Signed   By: Alcide Clever M.D.   On: 05/24/2022 00:20   CT Head Wo Contrast  Result Date: 05/24/2022 CLINICAL DATA:  Recent fall with headaches neck and facial pain, initial encounter EXAM: CT HEAD WITHOUT CONTRAST CT MAXILLOFACIAL WITHOUT CONTRAST CT CERVICAL SPINE WITHOUT CONTRAST TECHNIQUE: Multidetector CT imaging of the head, cervical spine, and maxillofacial structures were performed using the standard protocol without intravenous contrast. Multiplanar CT image reconstructions of the cervical spine and maxillofacial structures were also generated. RADIATION DOSE REDUCTION: This exam was performed according to the departmental dose-optimization program which includes automated exposure control, adjustment of the mA and/or kV according to patient size and/or use of iterative reconstruction technique. COMPARISON:  None Available. FINDINGS: CT HEAD FINDINGS Brain: No evidence of acute infarction, hemorrhage, hydrocephalus, extra-axial collection or mass lesion/mass effect. Vascular: No hyperdense vessel or unexpected calcification. Skull: Normal. Negative for fracture or focal lesion. Other:  None. CT MAXILLOFACIAL FINDINGS Osseous: No acute fracture or dislocation is noted. Considerable dental hardware is seen. Orbits: Orbits and their contents are within normal limits. Sinuses: Paranasal sinuses are well aerated. Soft tissues: Soft tissue edema is noted about the orbits as well as the forehead bilaterally consistent with the recent injury. No focal hematoma is seen. CT CERVICAL SPINE FINDINGS Alignment: Within normal limits. Skull base and vertebrae: 7 cervical segments are well visualized. Vertebral body height is well maintained. Multilevel osteophytic changes are seen. Facet hypertrophic changes are noted. Pain is formation is noted posterior to the odontoid. No acute fracture or acute facet abnormality is seen. Soft tissues and spinal canal: Surrounding soft tissue structures show diffuse vascular calcifications. No focal hematoma  is seen. No other abnormality is noted. Upper chest: Visualized lung apices show mild scarring particularly in the right apex. Other: None IMPRESSION: CT of the head: No acute intracranial abnormality noted. CT of the maxillofacial bones: No acute fracture is noted. Considerable soft tissue swelling is noted in the forehead and about the orbits without focal hematoma. CT of the cervical spine: Multilevel degenerative change without acute abnormality. Electronically Signed   By: Alcide Clever M.D.   On: 05/24/2022 00:09   CT Cervical Spine Wo Contrast  Result Date: 05/24/2022 CLINICAL DATA:  Recent fall with headaches neck and facial pain, initial encounter EXAM: CT HEAD WITHOUT CONTRAST CT MAXILLOFACIAL WITHOUT CONTRAST CT CERVICAL SPINE WITHOUT CONTRAST TECHNIQUE: Multidetector CT imaging of the head, cervical spine, and maxillofacial structures were performed using the standard protocol without intravenous contrast. Multiplanar CT image reconstructions of the cervical spine and maxillofacial structures were also generated. RADIATION DOSE REDUCTION: This exam was  performed according to the departmental dose-optimization program which includes automated exposure control, adjustment of the mA and/or kV according to patient size and/or use of iterative reconstruction technique. COMPARISON:  None Available. FINDINGS: CT HEAD FINDINGS Brain: No evidence of acute infarction, hemorrhage, hydrocephalus, extra-axial collection or mass lesion/mass effect. Vascular: No hyperdense vessel or unexpected calcification. Skull: Normal. Negative for fracture or focal lesion. Other: None. CT MAXILLOFACIAL FINDINGS Osseous: No acute fracture or dislocation is noted. Considerable dental hardware is seen. Orbits: Orbits and their contents are within normal limits. Sinuses: Paranasal sinuses are well aerated. Soft tissues: Soft tissue edema is noted about the orbits as well as the forehead bilaterally consistent with the recent injury. No focal hematoma is seen. CT CERVICAL SPINE FINDINGS Alignment: Within normal limits. Skull base and vertebrae: 7 cervical segments are well visualized. Vertebral body height is well maintained. Multilevel osteophytic changes are seen. Facet hypertrophic changes are noted. Pain is formation is noted posterior to the odontoid. No acute fracture or acute facet abnormality is seen. Soft tissues and spinal canal: Surrounding soft tissue structures show diffuse vascular calcifications. No focal hematoma is seen. No other abnormality is noted. Upper chest: Visualized lung apices show mild scarring particularly in the right apex. Other: None IMPRESSION: CT of the head: No acute intracranial abnormality noted. CT of the maxillofacial bones: No acute fracture is noted. Considerable soft tissue swelling is noted in the forehead and about the orbits without focal hematoma. CT of the cervical spine: Multilevel degenerative change without acute abnormality. Electronically Signed   By: Alcide Clever M.D.   On: 05/24/2022 00:09   CT Maxillofacial WO CM  Result Date:  05/24/2022 CLINICAL DATA:  Recent fall with headaches neck and facial pain, initial encounter EXAM: CT HEAD WITHOUT CONTRAST CT MAXILLOFACIAL WITHOUT CONTRAST CT CERVICAL SPINE WITHOUT CONTRAST TECHNIQUE: Multidetector CT imaging of the head, cervical spine, and maxillofacial structures were performed using the standard protocol without intravenous contrast. Multiplanar CT image reconstructions of the cervical spine and maxillofacial structures were also generated. RADIATION DOSE REDUCTION: This exam was performed according to the departmental dose-optimization program which includes automated exposure control, adjustment of the mA and/or kV according to patient size and/or use of iterative reconstruction technique. COMPARISON:  None Available. FINDINGS: CT HEAD FINDINGS Brain: No evidence of acute infarction, hemorrhage, hydrocephalus, extra-axial collection or mass lesion/mass effect. Vascular: No hyperdense vessel or unexpected calcification. Skull: Normal. Negative for fracture or focal lesion. Other: None. CT MAXILLOFACIAL FINDINGS Osseous: No acute fracture or dislocation is noted. Considerable dental hardware is seen.  Orbits: Orbits and their contents are within normal limits. Sinuses: Paranasal sinuses are well aerated. Soft tissues: Soft tissue edema is noted about the orbits as well as the forehead bilaterally consistent with the recent injury. No focal hematoma is seen. CT CERVICAL SPINE FINDINGS Alignment: Within normal limits. Skull base and vertebrae: 7 cervical segments are well visualized. Vertebral body height is well maintained. Multilevel osteophytic changes are seen. Facet hypertrophic changes are noted. Pain is formation is noted posterior to the odontoid. No acute fracture or acute facet abnormality is seen. Soft tissues and spinal canal: Surrounding soft tissue structures show diffuse vascular calcifications. No focal hematoma is seen. No other abnormality is noted. Upper chest: Visualized  lung apices show mild scarring particularly in the right apex. Other: None IMPRESSION: CT of the head: No acute intracranial abnormality noted. CT of the maxillofacial bones: No acute fracture is noted. Considerable soft tissue swelling is noted in the forehead and about the orbits without focal hematoma. CT of the cervical spine: Multilevel degenerative change without acute abnormality. Electronically Signed   By: Alcide Clever M.D.   On: 05/24/2022 00:09    EKG: I independently viewed the EKG done and my findings are as followed: Sinus rhythm rate of 84.  Nonspecific ST changes.  QTc 428.  Assessment/Plan Present on Admission:  Fall  Principal Problem:   Fall  Facial injuries secondary to mechanical fall CT head and neck, CT maxillofacial without contrast with no acute fracture. Hold off home aspirin and Plavix for now As needed analgesics As needed antiemetics PT OT assessment Fall precautions  Acute on chronic hypoxic respiratory failure in the setting of facial injuries Currently requiring 4 L to maintain a saturation above 90% At baseline on 2 L nasal cannula continuously Wean off oxygen supplementation as tolerated  Coronary artery disease on aspirin and Plavix History of dilated nonischemic cardiomyopathy postcardiac catheterization 11/27/2021 Hold off home aspirin and Plavix due to facial injuries Resume home Lipitor Cardiology consulted to provide further recommendations.  Essential hypertension, BP is not at goal, elevated Resume home oral antihypertensives Closely monitor vital signs  GERD Resume home PPI.  Chronic idiopathic pulmonary fibrosis Resume home regimen On 2 L nasal cannula at baseline  DVT prophylaxis: SCDs.  Code Status: Full code as stated by the patient and her husband at bedside.  Family Communication: Updated her husband at bedside.  Disposition Plan: Admitted to telemetry cardiac unit.  Consults called: Cardiology.  Admission status:  Inpatient status.   Status is: Inpatient The patient requires at least 2 midnights for further evaluation and treatment of her present condition.   Darlin Drop MD [facility] Pager [phone]  If 7PM-7AM, please contact night-coverage [email or web address] Password [email or web address]  05/24/2022, 1:44 AM

## 2022-05-24 NOTE — ED Notes (Signed)
Pt discharge to home. Pt VSS, GCS 15, NAD. Pt verbalized understanding of discharge instructions with no additional questions at this time.  

## 2022-05-24 NOTE — ED Notes (Signed)
Assumed care from Dorian, RN. Pt resting comfortably in bed at this time. Pt denies any current needs or questions. Call light with in reach.   

## 2022-05-24 NOTE — ED Notes (Signed)
Per MD Wouk trial pt on home [facility].

## 2022-05-24 NOTE — Evaluation (Signed)
Physical Therapy Evaluation Patient Details Name: Susan Sosa MRN: [id number] DOB: 1940/05/18 Today's Date: 05/24/2022  History of Present Illness  Susan Sosa is a 82 y.o. female with medical history significant for essential hypertension, paroxysmal atrial tachycardia, coronary artery disease on aspirin Plavix and Lipitor, history of NSTEMI (November 2023), last cardiac cath less than 6 months ago, hyperlipidemia, idiopathic pulmonary fibrosis, chronic respiratory failure with hypoxia on 2 L nasal cannula at baseline, chronic hyponatremia, moderate nonischemic dilated cardiomyopathy, who presented to [facility] ED after a mechanical fall at home.  The patient was walking to the bathroom with her oxygen when she tripped on her oxygen tubing and fell face forward.  No loss of consciousness. CT head, maxillofacial, and neck with no evidence of acute fracture.   Clinical Impression  Pt admitted with above diagnosis. Pt currently with functional limitations due to the deficits listed below (see PT Problem List). Pt received upright in bed agreeable to PT. Supportive spouse at bedside. Pt back to her baseline O2 needs at 2 L/min via [place]. Pt grossly supervision for all mobility this date completing gait in room ~50'. Supervision provided as pt admitted for falls and also for lines/lead management to reduce tripping hazard. Pt returning to supine with O2 desat to low 80's on 2 L/min needing education on PLB and energy conservation techniques. Encouraged more frequent SPO2 monitoring at home due to facial swelling may limit adequate O2 exchange from nasal cavities. Pt and spouse understanding of education. Unfortunately no stairs present in ED thus unable to assess but per clinical judgement and current functional mobility level of pt anticipate with rail support pt will not have issues with pt verifying comfortability with steps to enter home. Pt with no acute needs. NSG and TOC updated. PT to sign off.         Recommendations for follow up therapy are one component of a multi-disciplinary discharge planning process, led by the attending physician.  Recommendations may be updated based on patient status, additional functional criteria and insurance authorization.     Assistance Recommended at Discharge PRN  Patient can return home with the following  Help with stairs or ramp for entrance    Equipment Recommendations    Recommendations for Other Services       Functional Status Assessment Patient has not had a recent decline in their functional status     Precautions / Restrictions Precautions Precautions: Fall Restrictions Weight Bearing Restrictions: No      Mobility  Bed Mobility Overal bed mobility: Modified Independent               Patient Response: Cooperative  Transfers Overall transfer level: Needs assistance Equipment used: None Transfers: Sit to/from Stand Sit to Stand: Supervision                Ambulation/Gait Ambulation/Gait assistance: Supervision Gait Distance (Feet): 50 Feet Assistive device: None Gait Pattern/deviations: Step-through pattern, Decreased step length - right, Decreased step length - left       General Gait Details: Reports gait at baseline. Supervision provided mainly for line/lead management  Stairs            Wheelchair Mobility    Modified Rankin (Stroke Patients Only)       Balance Overall balance assessment: Needs assistance Sitting-balance support: Feet supported Sitting balance-Leahy Scale: Normal     Standing balance support: No upper extremity supported, During functional activity Standing balance-Leahy Scale: Good  Pertinent Vitals/Pain Pain Assessment Pain Assessment: Faces Faces Pain Scale: Hurts a little bit Pain Location: R shoulder and face Pain Descriptors / Indicators: Aching, Sore Pain Intervention(s): Monitored during session, Repositioned     Home Living Family/patient expects to be discharged to:: Private residence Living Arrangements: Spouse/significant other Available Help at Discharge: Family;Available 24 hours/day Type of Home: House Home Access: Stairs to enter Entrance Stairs-Rails: Right Entrance Stairs-Number of Steps: 3   Home Layout: One level Home Equipment: Grab bars - tub/shower;Grab bars - toilet;Shower seat - built in      Prior Function Prior Level of Function : Independent/Modified Independent;Driving;History of Falls (last six months)             Mobility Comments: IND for household and community distances without an AD, walks in the mall almost everyday, uses treadmill ADLs Comments: IND for ADls/IADLs, still driving     Hand Dominance        Extremity/Trunk Assessment   Upper Extremity Assessment Upper Extremity Assessment: Generalized weakness;RUE deficits/detail RUE Deficits / Details: pt with previous rotator cuff surgery, reports soreness from fall (bruising noted on anterior side of shoulder)    Lower Extremity Assessment Lower Extremity Assessment: Generalized weakness    Cervical / Trunk Assessment Cervical / Trunk Assessment: Normal  Communication   Communication: No difficulties  Cognition Arousal/Alertness: Awake/alert Behavior During Therapy: WFL for tasks assessed/performed Overall Cognitive Status: Within Functional Limits for tasks assessed                                          General Comments General comments (skin integrity, edema, etc.): Desat to lower 80's on 2L/min post ambulation. Returned with seated rest and PLB.    Exercises Other Exercises Other Exercises: Breathing techniques, monitoring O2 sats during ambulation.   Assessment/Plan    PT Assessment Patient does not need any further PT services  PT Problem List         PT Treatment Interventions      PT Goals (Current goals can be found in the Care Plan section)  Acute  Rehab PT Goals PT Goal Formulation: All assessment and education complete, DC therapy    Frequency       Co-evaluation               AM-PAC PT "6 Clicks" Mobility  Outcome Measure Help needed turning from your back to your side while in a flat bed without using bedrails?: None Help needed moving from lying on your back to sitting on the side of a flat bed without using bedrails?: None Help needed moving to and from a bed to a chair (including a wheelchair)?: A Little Help needed standing up from a chair using your arms (e.g., wheelchair or bedside chair)?: None Help needed to walk in hospital room?: None Help needed climbing 3-5 steps with a railing? : A Little 6 Click Score: 22    End of Session Equipment Utilized During Treatment: Oxygen Activity Tolerance: Patient tolerated treatment well Patient left: in bed;with call bell/phone within reach Nurse Communication: Mobility status      Time: 1610-9604 PT Time Calculation (min) (ACUTE ONLY): 14 min   Charges:   PT Evaluation $PT Eval Low Complexity: 1 Low          Nicolus Ose M. Fairly IV, PT, DPT Physical Therapist- [place]  [facility]  05/24/2022, 1:23  PM

## 2022-05-24 NOTE — Evaluation (Signed)
Occupational Therapy Evaluation Patient Details Name: Susan Sosa MRN: [id number] DOB: [birth year] Today's Date: 05/24/2022   History of Present Illness Susan Sosa is a 82 y.o. female with medical history significant for essential hypertension, paroxysmal atrial tachycardia, coronary artery disease on aspirin Plavix and Lipitor, history of NSTEMI (November 2023), last cardiac cath less than 6 months ago, hyperlipidemia, idiopathic pulmonary fibrosis, chronic respiratory failure with hypoxia on 2 L nasal cannula at baseline, chronic hyponatremia, moderate nonischemic dilated cardiomyopathy, who presented to [facility] ED after a mechanical fall at home.  The patient was walking to the bathroom with her oxygen when she tripped on her oxygen tubing and fell face forward.  No loss of consciousness. CT head, maxillofacial, and neck with no evidence of acute fracture.   Clinical Impression   Patient agreeable to OT evaluation. PTA pt lived with spouse, was independent for ADLs/IADLs, and independent for functional mobility without an AD. Pt currently functioning at Mod I for bed mobility, set up A for seated LB dressing, supervision-CGA for STS from EOB, and CGA to take several lateral steps at EOB without an AD. Pt with bruising to R shoulder and multiple areas of face from recent fall. Pt on 10L O2 via face tent, SpO2 maintained >90% with activity. Pt appears close to baseline level of function with ADLs. Will keep on OT caseload to continue ADL training in order to prevent functional decline and reduce caregiver burden. No follow up OT needs recommended at this time.     Recommendations for follow up therapy are one component of a multi-disciplinary discharge planning process, led by the attending physician.  Recommendations may be updated based on patient status, additional functional criteria and insurance authorization.   Assistance Recommended at Discharge PRN  Patient can return home with the following  Assistance with cooking/housework;Assist for transportation;Help with stairs or ramp for entrance    Functional Status Assessment  Patient has had a recent decline in their functional status and demonstrates the ability to make significant improvements in function in a reasonable and predictable amount of time.  Equipment Recommendations  None recommended by OT    Recommendations for Other Services       Precautions / Restrictions Precautions Precautions: Fall Restrictions Weight Bearing Restrictions: No      Mobility Bed Mobility Overal bed mobility: Modified Independent                  Transfers Overall transfer level: Needs assistance Equipment used: None Transfers: Sit to/from Stand Sit to Stand: Min guard, Supervision           General transfer comment: STS from stretcher, Min guard for safety due to initial unsteadiness      Balance Overall balance assessment: Needs assistance Sitting-balance support: Feet supported Sitting balance-Leahy Scale: Normal     Standing balance support: No upper extremity supported, During functional activity Standing balance-Leahy Scale: Good                             ADL either performed or assessed with clinical judgement   ADL Overall ADL's : Needs assistance/impaired                     Lower Body Dressing: Set up;Sitting/lateral leans Lower Body Dressing Details (indicate cue type and reason): to don slip on shoes Toilet Transfer: Min Agricultural engineer Details (indicate cue type and reason): simulated with STS from stretcher, Min guard  for safety due to initial unsteadiness, no AD         Functional mobility during ADLs: Min guard (Min guard for safety 2/2 initial unsteadiness, able to take ~3 lateral steps at EOB without an AD, limited distance due to face tent)       Vision Baseline Vision/History: 1 Wears glasses Patient Visual Report: No change from  baseline       Perception     Praxis      Pertinent Vitals/Pain Pain Assessment Pain Assessment: Faces Faces Pain Scale: Hurts a little bit Pain Location: R shoulder and face Pain Descriptors / Indicators: Aching, Sore Pain Intervention(s): Monitored during session, Repositioned     Hand Dominance     Extremity/Trunk Assessment Upper Extremity Assessment Upper Extremity Assessment: Generalized weakness;RUE deficits/detail RUE Deficits / Details: pt with previous rotator cuff surgery, reports soreness from fall (bruising noted on anterior side of shoulder)   Lower Extremity Assessment Lower Extremity Assessment: Generalized weakness   Cervical / Trunk Assessment Cervical / Trunk Assessment: Normal   Communication Communication Communication: No difficulties   Cognition Arousal/Alertness: Awake/alert Behavior During Therapy: WFL for tasks assessed/performed Overall Cognitive Status: Within Functional Limits for tasks assessed         General Comments     Exercises Other Exercises Other Exercises: OT provided education re: role of OT, OT POC, post acute recs, sitting up for all meals, EOB/OOB mobility with assistance, home/fall safety.     Shoulder Instructions      Home Living Family/patient expects to be discharged to:: Private residence Living Arrangements: Spouse/significant other Available Help at Discharge: Family;Available 24 hours/day Type of Home: House Home Access: Stairs to enter [employer] of Steps: 3 Entrance Stairs-Rails: Right Home Layout: One level     Bathroom Shower/Tub: Producer, television/film/video: Handicapped height     Home Equipment: Grab bars - tub/shower;Grab bars - toilet;Shower seat - built in          Prior Functioning/Environment Prior Level of Function : Independent/Modified Independent;Driving;History of Falls (last six months)             Mobility Comments: IND for household and community  distances without an AD, walks in the mall almost everyday, uses treadmill ADLs Comments: IND for ADls/IADLs, still driving        OT Problem List: Decreased strength;Decreased range of motion;Decreased activity tolerance;Impaired balance (sitting and/or standing);Pain;Cardiopulmonary status limiting activity;Decreased knowledge of use of DME or AE      OT Treatment/Interventions: Self-care/ADL training;Therapeutic exercise;Energy conservation;DME and/or AE instruction;Therapeutic activities;Patient/family education;Balance training    OT Goals(Current goals can be found in the care plan section) Acute Rehab OT Goals Patient Stated Goal: return home OT Goal Formulation: With patient Time For Goal Achievement: 06/07/22 Potential to Achieve Goals: Good   OT Frequency: Min 1X/week    Co-evaluation              AM-PAC OT "6 Clicks" Daily Activity     Outcome Measure Help from another person eating meals?: None Help from another person taking care of personal grooming?: None Help from another person toileting, which includes using toliet, bedpan, or urinal?: A Little Help from another person bathing (including washing, rinsing, drying)?: A Little Help from another person to put on and taking off regular upper body clothing?: None Help from another person to put on and taking off regular lower body clothing?: A Little 6 Click Score: 21   End of Session Equipment Utilized During  Treatment: Gait belt Nurse Communication: Mobility status  Activity Tolerance: Patient tolerated treatment well Patient left: in bed;with call bell/phone within reach;with bed alarm set  OT Visit Diagnosis: Unsteadiness on feet (R26.81);Muscle weakness (generalized) (M62.81);History of falling (Z91.81);Pain Pain - Right/Left: Right Pain - part of body: Shoulder                Time: 1000-1015 OT Time Calculation (min): 15 min Charges:  OT General Charges $OT Visit: 1 Visit OT Evaluation $OT Eval Low  Complexity: 1 Low  Nichola Warren MS, OTR/L ascom [phone]  05/24/22, 1:21 PM

## 2022-09-28 IMAGING — DX DG ABDOMEN 1V
2 series · 2 of 2 positions shown · non-contrast
Comparison: None.

CLINICAL DATA: Constipation

EXAM:
ABDOMEN - 1 VIEW

[abdomen supine (1 of 2)]
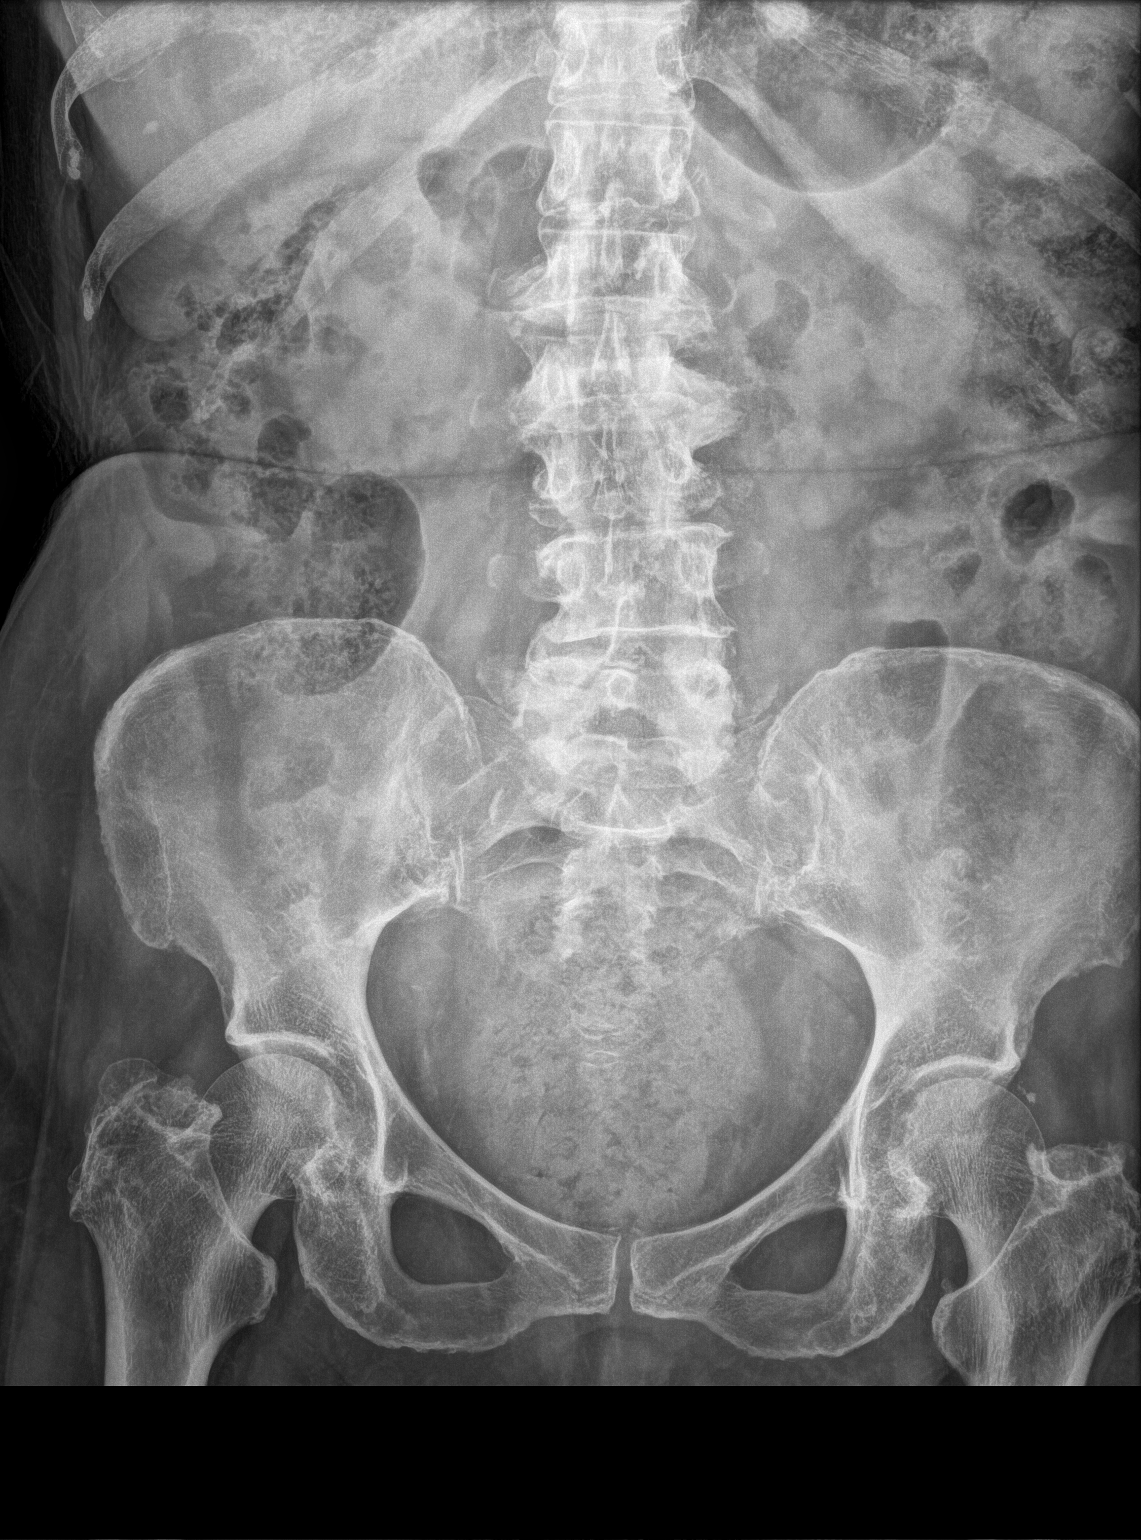

[abdomen supine (2 of 2)]
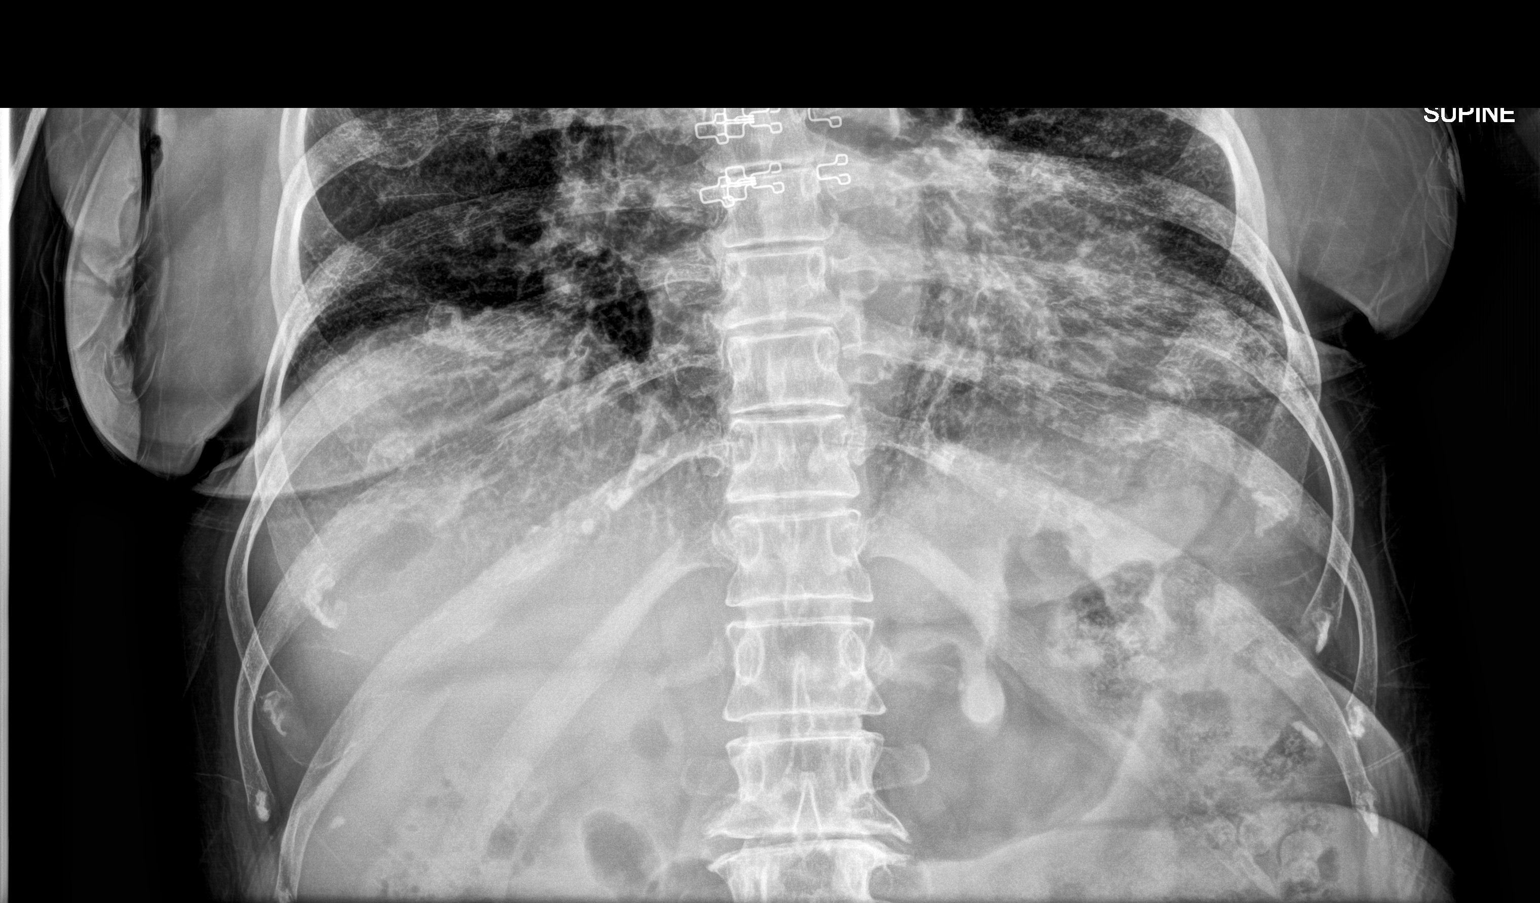

[2 of 2 positions shown; findings below may reference images not displayed]

FINDINGS: The bowel gas pattern is normal. Large amount of retained colorectal
stool suggesting constipation/fecal impaction. Lumbar spondylosis.
Visualized lung bases are clear.
IMPRESSION: Large amount of retained colorectal stool suggesting
constipation/fecal impaction.

## 2022-09-28 IMAGING — CR DG ABDOMEN 1V
1 series · 2 of 2 positions shown · non-contrast
Comparison: April 22, 2021 at [DATE] p.m.

CLINICAL DATA: Post disimpaction

EXAM:
ABDOMEN - 1 VIEW

[Series 1: dg abd 1 view · 0.14mm/px · 2 of 2 slices shown]
[im 1/2]
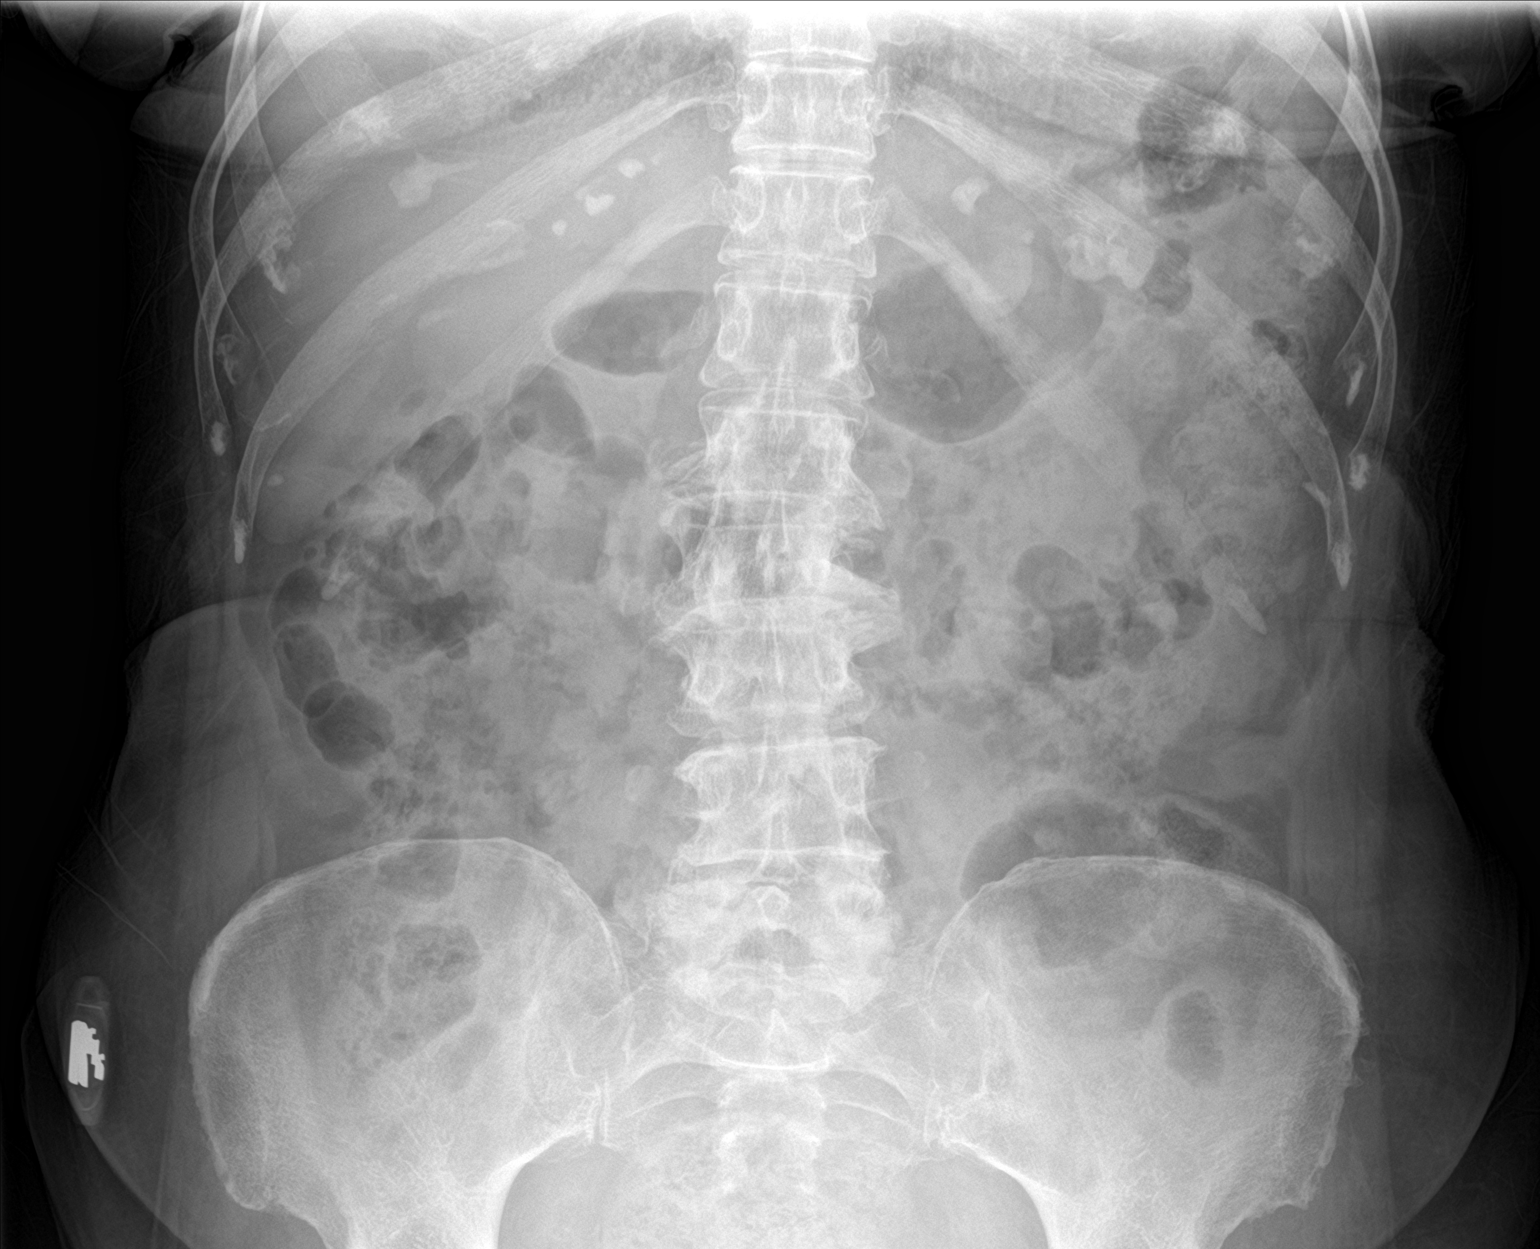
[im 2/2]
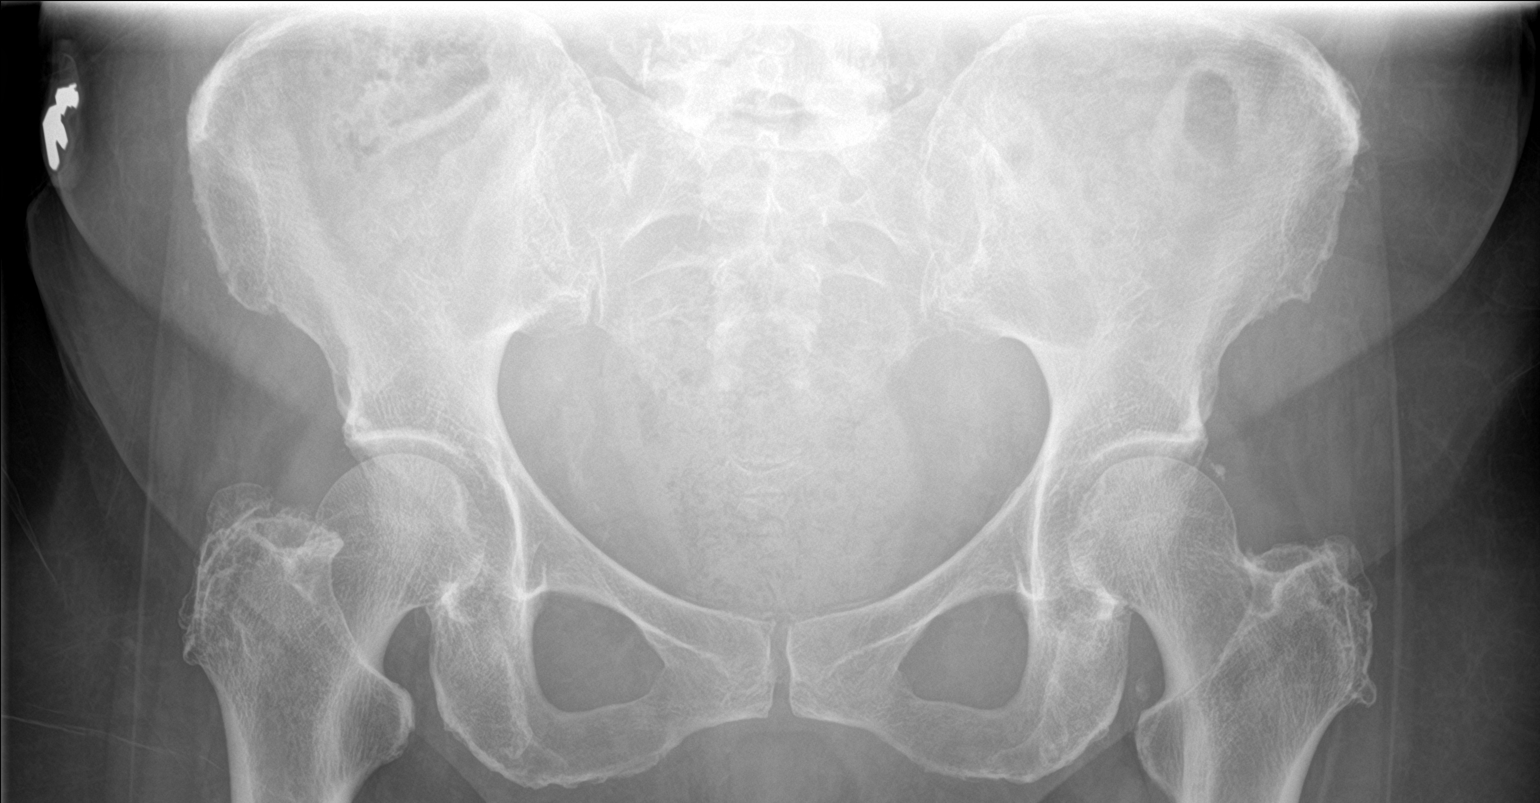

[2 of 2 positions shown; findings below may reference images not displayed]

FINDINGS: No signs of bowel obstruction. Moderate stool throughout the colon.
No abnormal calcifications over LEFT and RIGHT renal contour.

Still with moderate to large amount of stool in the rectum.

On limited assessment there is no acute skeletal process. Spinal
degenerative changes are noted.
IMPRESSION: Still with moderate stool in the colon and abundant stool in the
rectum reportedly post disimpaction.

## 2022-10-19 IMAGING — US US RENAL ARTERY STENOSIS
2 series · 13 of 25 positions shown · non-contrast
Comparison: Chest CT-04/11/2018

CLINICAL DATA: Hypertension and shortness breath. Evaluate for
renal artery stenosis.

EXAM:
RENAL/URINARY TRACT ULTRASOUND
RENAL DUPLEX DOPPLER ULTRASOUND

[Series 1: us renal artery duplex complete · 12 of 103 slices shown (1 of 2)]
[im 1/103]
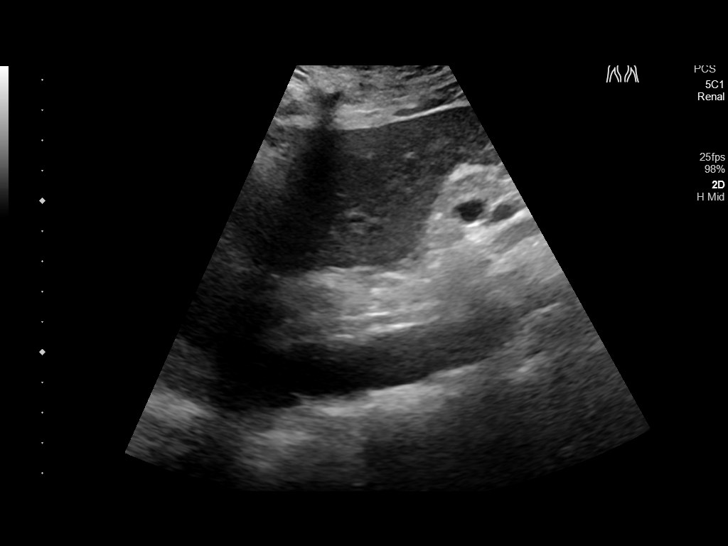
[im 9/103]
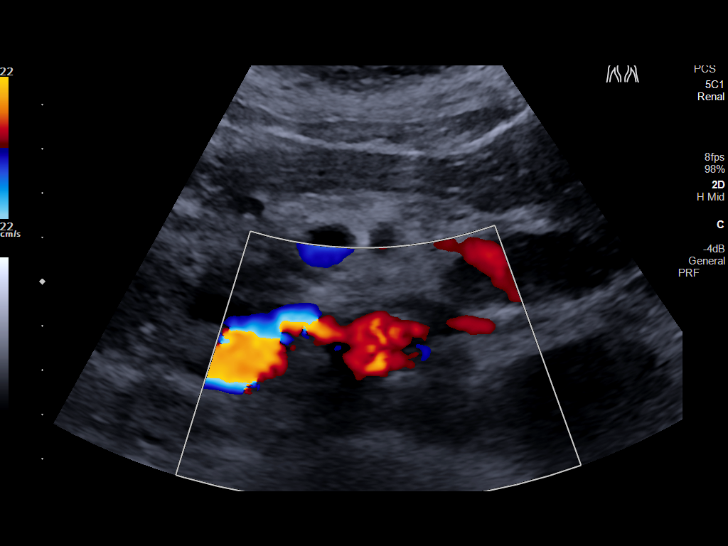
[im 18/103]
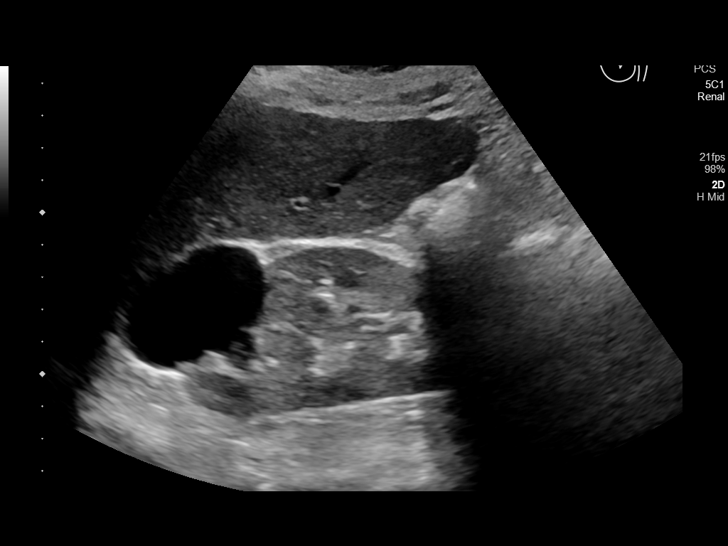
[im 27/103]
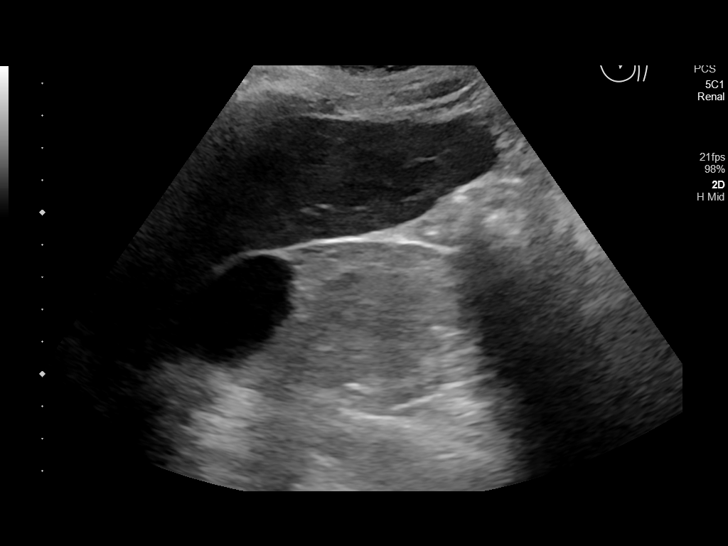
[im 36/103]
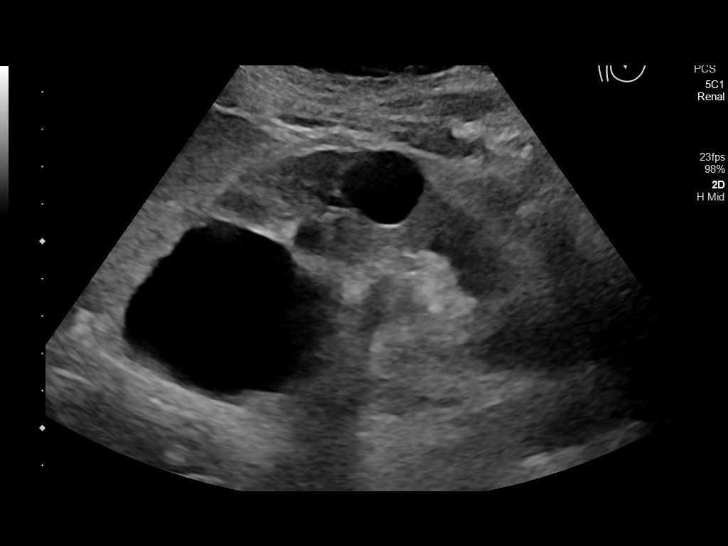
[im 45/103]
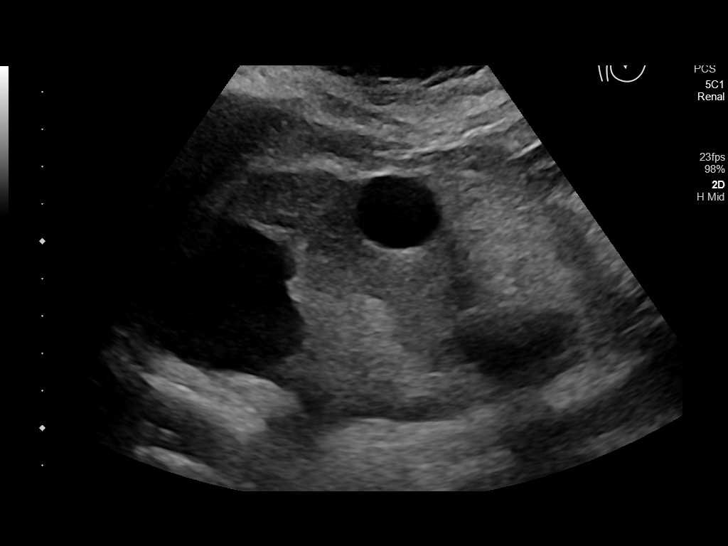
[im 54/103]
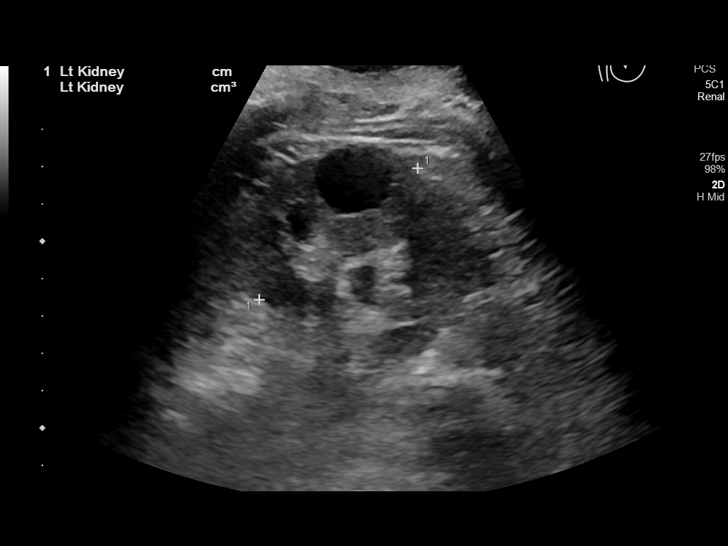
[im 63/103]
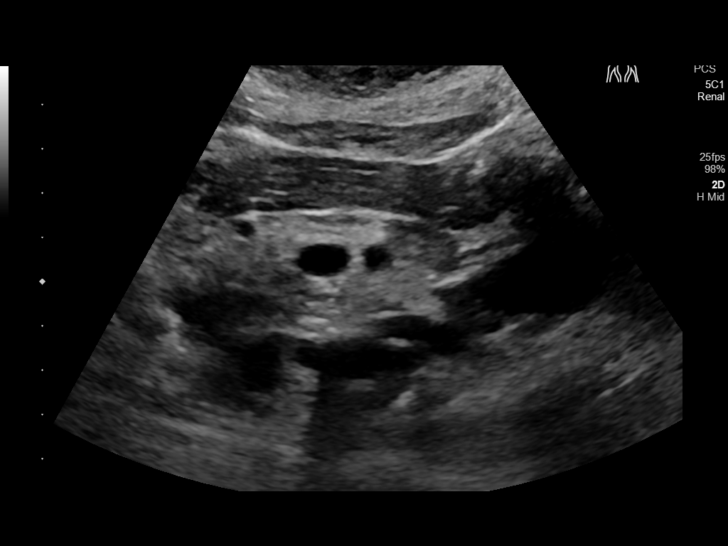
[im 71/103]
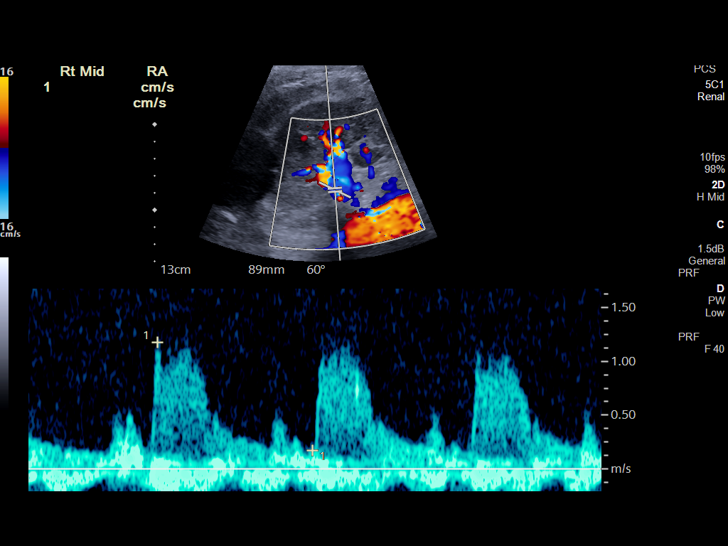
[im 80/103]
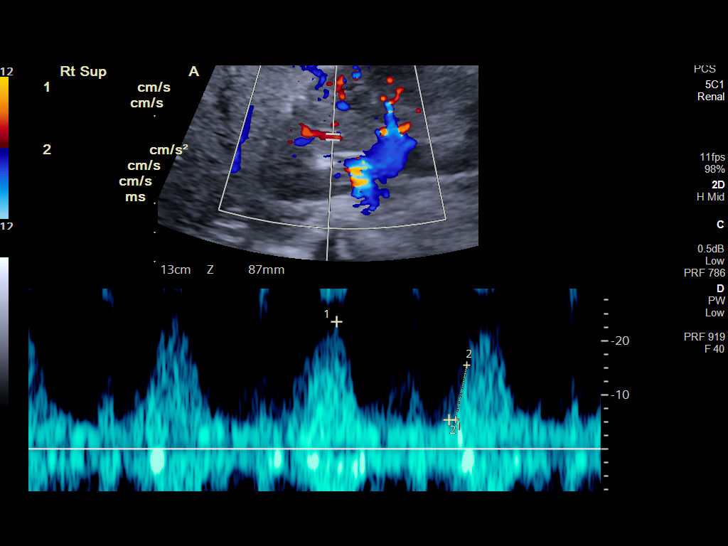
[im 89/103]
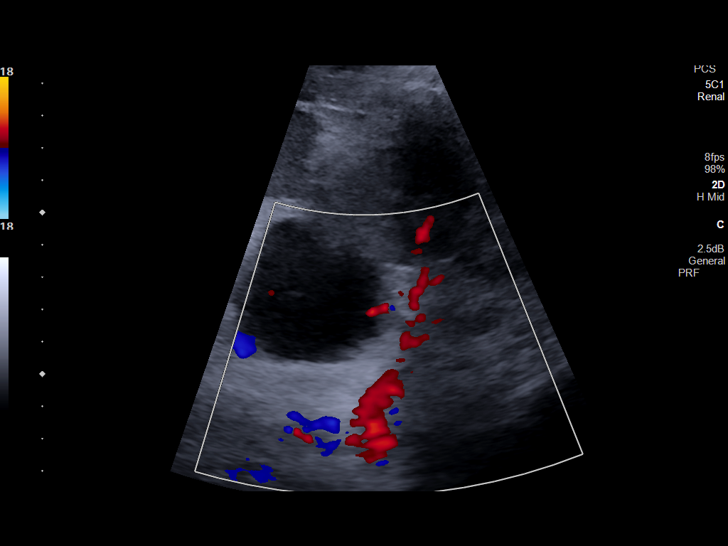
[im 98/103]
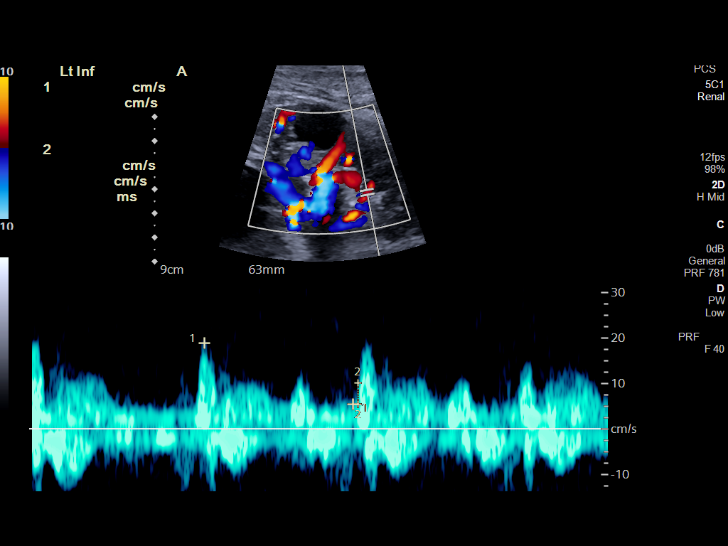

[Series 1001: us renal artery duplex complete · 1 of 1 slices shown (2 of 2)]
[im 1/1]
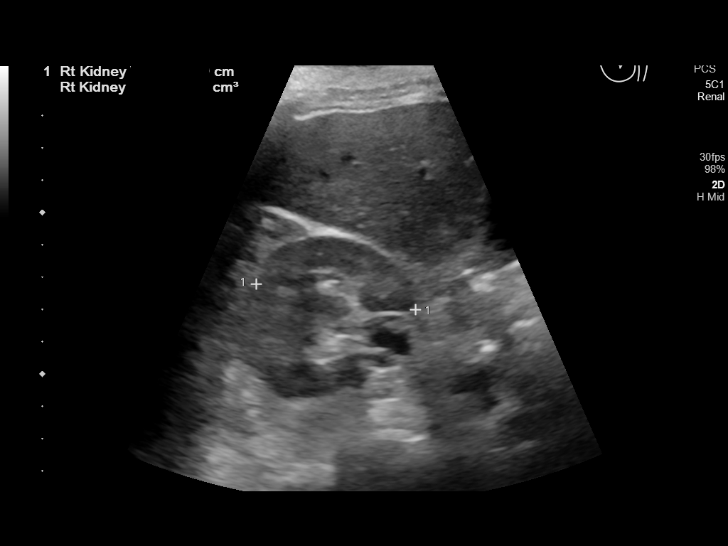

[13 of 25 positions shown; findings below may reference images not displayed]

FINDINGS: Right Kidney:

While there is preserved right renal size and cortical thickness,
with the right kidney measuring 9.6 cm in length, there is diffuse
increased cortical echogenicity. Note is made of an approximately
4.2 x 4.2 x 3.8 cm partially exophytic anechoic cyst arising from
the superior pole of the of the right kidney. No echogenic renal
stones. No urinary obstruction.

Left Kidney:

While there is preserved left renal size and cortical thickness with
the left kidney measuring 9.6 cm in length, there is diffuse
increased cortical echogenicity. Note made of an approximately 5.2 x
4.5 x 4.5 cm anechoic cyst arising from the superior pole the left
kidney as well as an approximately 3.4 x 2.4 x 2.1 cm anechoic cyst
arising from the inferior pole the left kidney. No echogenic renal
stones. No urinary obstruction.

Bladder:  Appears normal given degree of distention.

RENAL DUPLEX ULTRASOUND

Right renal artery is noted to be duplicated with velocity
measurements as follows:

Origin:  111/168 cm/sec

Mid:  118/110 cm/sec

Hilum:  68/42 cm/sec

Interlobar:  24 cm/sec

Arcuate:  15 cm/sec

Left Renal Artery Velocities:

Origin:  134 cm/sec

Mid:  114 cm/sec

Hilum:  52 cm/sec

Interlobar:  26 cm/sec

Arcuate:  17 cm/sec

Aortic Velocity:  143 cm/sec

Right Renal-Aortic Ratios:

Origin: 0.8/1.2

Mid:  0.8/0.8

Hilum: 0.5/0.3

Interlobar:

Arcuate:

Left Renal-Aortic Ratios:

Origin:

Mid:

Hilum:

Interlobar:

Arcuate:

Normal velocities and low resistance waveforms demonstrated
throughout the interrogated portions bilateral renal arteries
(including both duplicated right renal arteries), as well as the
bilateral renal parenchyma. The bilateral renal veins appear patent

Scattered mixed echogenic plaque is seen throughout the normal
caliber abdominal aorta.
IMPRESSION: 1. No evidence of renal artery stenosis. Incidentally noted
duplicated right renal arteries.
2. Atherosclerotic plaque within a normal caliber abdominal aorta.
Aortic Atherosclerosis ([id number]).
3. Diffuse cortical echogenicity, nonspecific though could be seen
in the setting of medical renal disease.
4. Incidentally noted bilateral renal cysts. No evidence of urinary
obstruction.

## 2022-11-05 ENCOUNTER — Other Ambulatory Visit
Admission: RE | Admit: 2022-11-05 | Discharge: 2022-11-05 | Disposition: A | Discharge status: Home / Self Care | Payer: Medicare Other | Source: Ambulatory Visit | Attending: *Deleted | Admitting: *Deleted

## 2022-11-05 DIAGNOSIS — R0600 Dyspnea, unspecified: Secondary | ICD-10-CM | POA: Insufficient documentation

## 2022-11-05 DIAGNOSIS — R0602 Shortness of breath: Secondary | ICD-10-CM | POA: Diagnosis not present

## 2022-11-05 DIAGNOSIS — J84112 Idiopathic pulmonary fibrosis: Secondary | ICD-10-CM | POA: Diagnosis not present

## 2022-11-05 LAB — D-DIMER, QUANTITATIVE: D-Dimer, Quant: 0.55 ug{FEU}/mL — ABNORMAL HIGH (ref 0.00–0.50)

## 2022-11-08 ENCOUNTER — Inpatient Hospital Stay
Admission: EM | Admit: 2022-11-08 | Discharge: 2022-11-10 | DRG: 196 | Disposition: A | Discharge status: Home Health | Payer: Medicare Other | Attending: Internal Medicine | Admitting: Internal Medicine

## 2022-11-08 ENCOUNTER — Other Ambulatory Visit: Payer: Self-pay

## 2022-11-08 ENCOUNTER — Emergency Department: Payer: Medicare Other

## 2022-11-08 DIAGNOSIS — I420 Dilated cardiomyopathy: Secondary | ICD-10-CM | POA: Diagnosis present

## 2022-11-08 DIAGNOSIS — Z885 Allergy status to narcotic agent status: Secondary | ICD-10-CM

## 2022-11-08 DIAGNOSIS — J439 Emphysema, unspecified: Secondary | ICD-10-CM | POA: Diagnosis present

## 2022-11-08 DIAGNOSIS — I110 Hypertensive heart disease with heart failure: Secondary | ICD-10-CM | POA: Diagnosis present

## 2022-11-08 DIAGNOSIS — J84112 Idiopathic pulmonary fibrosis: Secondary | ICD-10-CM | POA: Diagnosis present

## 2022-11-08 DIAGNOSIS — I480 Paroxysmal atrial fibrillation: Secondary | ICD-10-CM | POA: Diagnosis present

## 2022-11-08 DIAGNOSIS — I2510 Atherosclerotic heart disease of native coronary artery without angina pectoris: Secondary | ICD-10-CM | POA: Diagnosis present

## 2022-11-08 DIAGNOSIS — J9621 Acute and chronic respiratory failure with hypoxia: Principal | ICD-10-CM | POA: Diagnosis present

## 2022-11-08 DIAGNOSIS — Z91040 Latex allergy status: Secondary | ICD-10-CM | POA: Diagnosis not present

## 2022-11-08 DIAGNOSIS — I272 Pulmonary hypertension, unspecified: Secondary | ICD-10-CM | POA: Diagnosis not present

## 2022-11-08 DIAGNOSIS — I10 Essential (primary) hypertension: Secondary | ICD-10-CM | POA: Diagnosis present

## 2022-11-08 DIAGNOSIS — I160 Hypertensive urgency: Secondary | ICD-10-CM | POA: Diagnosis present

## 2022-11-08 DIAGNOSIS — Z1152 Encounter for screening for COVID-19: Secondary | ICD-10-CM | POA: Diagnosis not present

## 2022-11-08 DIAGNOSIS — E871 Hypo-osmolality and hyponatremia: Secondary | ICD-10-CM | POA: Diagnosis present

## 2022-11-08 DIAGNOSIS — Z79899 Other long term (current) drug therapy: Secondary | ICD-10-CM | POA: Diagnosis not present

## 2022-11-08 DIAGNOSIS — E785 Hyperlipidemia, unspecified: Secondary | ICD-10-CM | POA: Diagnosis present

## 2022-11-08 DIAGNOSIS — I5022 Chronic systolic (congestive) heart failure: Secondary | ICD-10-CM | POA: Diagnosis present

## 2022-11-08 DIAGNOSIS — Z87891 Personal history of nicotine dependence: Secondary | ICD-10-CM

## 2022-11-08 DIAGNOSIS — Z7982 Long term (current) use of aspirin: Secondary | ICD-10-CM | POA: Diagnosis not present

## 2022-11-08 DIAGNOSIS — I4719 Other supraventricular tachycardia: Secondary | ICD-10-CM | POA: Diagnosis present

## 2022-11-08 DIAGNOSIS — I959 Hypotension, unspecified: Secondary | ICD-10-CM | POA: Diagnosis not present

## 2022-11-08 DIAGNOSIS — R0602 Shortness of breath: Secondary | ICD-10-CM | POA: Diagnosis present

## 2022-11-08 DIAGNOSIS — I252 Old myocardial infarction: Secondary | ICD-10-CM | POA: Diagnosis not present

## 2022-11-08 DIAGNOSIS — Z9981 Dependence on supplemental oxygen: Secondary | ICD-10-CM

## 2022-11-08 DIAGNOSIS — Z888 Allergy status to other drugs, medicaments and biological substances status: Secondary | ICD-10-CM

## 2022-11-08 LAB — HEPATIC FUNCTION PANEL
ALT: 25 U/L (ref 0–44)
AST: 25 U/L (ref 15–41)
Albumin: 4.5 g/dL (ref 3.5–5.0)
Alkaline Phosphatase: 85 U/L (ref 38–126)
Bilirubin, Direct: <0.1 mg/dL (ref 0.0–0.2)
Total Bilirubin: 0.5 mg/dL (ref 0.3–1.2)
Total Protein: 8.3 g/dL — ABNORMAL HIGH (ref 6.5–8.1)

## 2022-11-08 LAB — CBC
HCT: 43.2 % (ref 36.0–46.0)
HCT: 41.6 % (ref 36.0–46.0)
Hemoglobin: 14.8 g/dL (ref 12.0–15.0)
Hemoglobin: 14.3 g/dL (ref 12.0–15.0)
MCH: 29.4 pg (ref 26.0–34.0)
MCH: 28.9 pg (ref 26.0–34.0)
MCHC: 34.3 g/dL (ref 30.0–36.0)
MCHC: 34.4 g/dL (ref 30.0–36.0)
MCV: 85.7 fL (ref 80.0–100.0)
MCV: 84.2 fL (ref 80.0–100.0)
Platelets: 349 10*3/uL (ref 150–400)
Platelets: 320 10*3/uL (ref 150–400)
RBC: 5.04 MIL/uL (ref 3.87–5.11)
RBC: 4.94 MIL/uL (ref 3.87–5.11)
RDW: 13.9 % (ref 11.5–15.5)
RDW: 13.6 % (ref 11.5–15.5)
WBC: 11 10*3/uL — ABNORMAL HIGH (ref 4.0–10.5)
WBC: 9.6 10*3/uL (ref 4.0–10.5)
nRBC: 0 % (ref 0.0–0.2)
nRBC: 0 % (ref 0.0–0.2)

## 2022-11-08 LAB — TROPONIN I (HIGH SENSITIVITY)
Troponin I (High Sensitivity): 5 ng/L (ref <18)
Troponin I (High Sensitivity): 7 ng/L (ref <18)

## 2022-11-08 LAB — BASIC METABOLIC PANEL
Anion gap: 11 (ref 5–15)
BUN: 19 mg/dL (ref 8–23)
CO2: 25 mmol/L (ref 22–32)
Calcium: 9.3 mg/dL (ref 8.9–10.3)
Chloride: 91 mmol/L — ABNORMAL LOW (ref 98–111)
Creatinine, Ser: 0.81 mg/dL (ref 0.44–1.00)
GFR, Estimated: >60 mL/min (ref >60)
Glucose, Bld: 108 mg/dL — ABNORMAL HIGH (ref 70–99)
Potassium: 4.5 mmol/L (ref 3.5–5.1)
Sodium: 127 mmol/L — ABNORMAL LOW (ref 135–145)

## 2022-11-08 LAB — BRAIN NATRIURETIC PEPTIDE: B Natriuretic Peptide: 158.1 pg/mL — ABNORMAL HIGH (ref 0.0–100.0)

## 2022-11-08 LAB — PROCALCITONIN: Procalcitonin: <0.1 ng/mL

## 2022-11-08 LAB — SARS CORONAVIRUS 2 BY RT PCR: SARS Coronavirus 2 by RT PCR: NEGATIVE

## 2022-11-08 MED ORDER — METHYLPREDNISOLONE SODIUM SUCC 125 MG IJ SOLR
125.0000 mg | Freq: Once | INTRAMUSCULAR | Status: AC
  Administered 2022-11-08: 125 mg via INTRAVENOUS
  Filled 2022-11-08: qty 2

## 2022-11-08 MED ORDER — HEPARIN SODIUM (PORCINE) 5000 UNIT/ML IJ SOLN
5000.0000 [IU] | Freq: Three times a day (TID) | INTRAMUSCULAR | Status: DC
  Administered 2022-11-09 – 2022-11-10 (×4): 5000 [IU] via SUBCUTANEOUS
  Filled 2022-11-08 (×4): qty 1

## 2022-11-08 MED ORDER — ATORVASTATIN CALCIUM 20 MG PO TABS
40.0000 mg | ORAL_TABLET | Freq: Every day | ORAL | Status: DC
  Administered 2022-11-09 – 2022-11-10 (×2): 40 mg via ORAL
  Filled 2022-11-08 (×2): qty 2

## 2022-11-08 MED ORDER — IOHEXOL 350 MG/ML SOLN
75.0000 mL | Freq: Once | INTRAVENOUS | Status: AC | PRN
  Administered 2022-11-08: 75 mL via INTRAVENOUS

## 2022-11-08 MED ORDER — IPRATROPIUM-ALBUTEROL 0.5-2.5 (3) MG/3ML IN SOLN
3.0000 mL | Freq: Once | RESPIRATORY_TRACT | Status: AC
  Administered 2022-11-08: 3 mL via RESPIRATORY_TRACT
  Filled 2022-11-08: qty 3

## 2022-11-08 MED ORDER — PANTOPRAZOLE SODIUM 40 MG PO TBEC
40.0000 mg | DELAYED_RELEASE_TABLET | Freq: Every day | ORAL | Status: DC
  Administered 2022-11-09 – 2022-11-10 (×2): 40 mg via ORAL
  Filled 2022-11-08 (×2): qty 1

## 2022-11-08 MED ORDER — PIRFENIDONE 267 MG PO CAPS: 267.0000 mg | ORAL_CAPSULE | Freq: Three times a day (TID) | ORAL | Status: DC

## 2022-11-08 MED ORDER — ALBUTEROL SULFATE (2.5 MG/3ML) 0.083% IN NEBU: 2.5000 mg | INHALATION_SOLUTION | RESPIRATORY_TRACT | Status: DC | PRN

## 2022-11-08 MED ORDER — LOSARTAN POTASSIUM 50 MG PO TABS: 50.0000 mg | ORAL_TABLET | Freq: Every day | ORAL | Status: DC

## 2022-11-08 MED ORDER — ACETAMINOPHEN 650 MG RE SUPP: 650.0000 mg | Freq: Four times a day (QID) | RECTAL | Status: DC | PRN

## 2022-11-08 MED ORDER — CLONIDINE HCL 0.1 MG PO TABS
0.2000 mg | ORAL_TABLET | Freq: Two times a day (BID) | ORAL | Status: DC
  Administered 2022-11-09 (×2): 0.2 mg via ORAL
  Filled 2022-11-08 (×2): qty 2

## 2022-11-08 MED ORDER — NITROGLYCERIN 0.4 MG SL SUBL: 0.4000 mg | SUBLINGUAL_TABLET | SUBLINGUAL | Status: DC | PRN

## 2022-11-08 MED ORDER — SODIUM CHLORIDE 0.9% FLUSH
3.0000 mL | Freq: Two times a day (BID) | INTRAVENOUS | Status: DC
  Administered 2022-11-08 – 2022-11-10 (×4): 3 mL via INTRAVENOUS

## 2022-11-08 MED ORDER — ALBUTEROL SULFATE (2.5 MG/3ML) 0.083% IN NEBU: 2.5000 mg | INHALATION_SOLUTION | Freq: Four times a day (QID) | RESPIRATORY_TRACT | Status: DC | PRN

## 2022-11-08 MED ORDER — ACETAMINOPHEN 325 MG PO TABS
650.0000 mg | ORAL_TABLET | Freq: Four times a day (QID) | ORAL | Status: DC | PRN
  Administered 2022-11-09: 650 mg via ORAL
  Filled 2022-11-08: qty 2

## 2022-11-08 MED ORDER — VERAPAMIL HCL ER 180 MG PO TBCR
180.0000 mg | EXTENDED_RELEASE_TABLET | Freq: Every day | ORAL | Status: DC
  Administered 2022-11-09 (×2): 180 mg via ORAL
  Filled 2022-11-08 (×2): qty 1

## 2022-11-08 MED ORDER — METHYLPREDNISOLONE SODIUM SUCC 125 MG IJ SOLR
80.0000 mg | Freq: Two times a day (BID) | INTRAMUSCULAR | Status: DC
  Administered 2022-11-09 – 2022-11-10 (×3): 80 mg via INTRAVENOUS
  Filled 2022-11-08 (×3): qty 2

## 2022-11-08 MED ORDER — ONDANSETRON HCL 4 MG PO TABS: 8.0000 mg | ORAL_TABLET | Freq: Once | ORAL | Status: DC

## 2022-11-08 NOTE — H&P (Signed)
History and Physical    Patient: Susan Sosa OZH:[id number] DOB: October 16, 1940 DOA: 11/08/2022 DOS: the patient was seen and examined on 11/08/2022 PCP: Marisue Ivan, MD  Patient coming from: Home   Chief Complaint:  Chief Complaint  Patient presents with   Shortness of Breath    HPI: Susan Sosa is a 82 y.o. female with medical history significant for  Pt with pmh of latex, lidocaine, atorvastatin, losartan, metronidazole,oxycodone ,essential hypertension on Verapamil ,hydralazine and maxide  paroxysmal atrial tachycardia, coronary artery disease on aspirin Plavix and Lipitor, history of NSTEMI (November 2023), last cardiac cath less than 6 months ago, hyperlipidemia, Idiopathic pulmonary fibrosis and chronic respiratory failure with hypoxia on 2 L nasal cannula at baseline on pirfenidone, chronic hyponatremia, moderate nonischemic dilated cardiomyopathy,  coming for SOB. Chart review shows pt follows with Dr. Juliann Pares and last heart cath was left heart cath showing:  There is moderate to severe left ventricular systolic dysfunction.   LV end diastolic pressure is mildly elevated.   The left ventricular ejection fraction is 35-45% by visual estimate.   No significant coronary disease right dominant system   Conclusion Left ventricular function appeared to be moderately depressed with anterior apical akinesis suggestive of possible Takotsubo EF of around 40% Coronary artery Left main free of disease LAD free of disease Circumflex free of disease RCA normal free of disease Right dominant system Patient tolerated procedure well No complications Nonischemic cardiomyopathy possibly Takotsubo   In ed pt is A/O x 3,hypertensive with BP of 200/92 on 2-3 L [place]. Hyponatremia of 127, chloride of 97 and glucose 108. LFT added and pending.  BNP of 158.1 Troponin normal. Procalcitonin < 0.10, cbc shows leucocytosis of 11.0 and normal hb and platelet count. Elevated dimer of 0.55- CTA chest  pending.  Pt had solumedrol and duoneb and admission request for suspected pna and or worsening of her existing pulmonary fibrosis.   Review of Systems: Review of Systems  Respiratory:  Positive for shortness of breath.   All other systems reviewed and are negative.   Past Medical History:  Diagnosis Date   Arthritis    Heart murmur    Hyperlipidemia    Hypertension    Hyponatremia    IPF (idiopathic pulmonary fibrosis) (HCC)    Past Surgical History:  Procedure Laterality Date   LEFT HEART CATH AND CORONARY ANGIOGRAPHY N/A 11/27/2021   Procedure: LEFT HEART CATH AND CORONARY ANGIOGRAPHY and possible pci and stent;  Surgeon: Alwyn Pea, MD;  Location: [facility] INVASIVE CV LAB;  Service: Cardiovascular;  Laterality: N/A;   Social History:   reports that she has quit smoking. She has never used smokeless tobacco. She reports that she does not drink alcohol and does not use drugs.  Allergies  Allergen Reactions   Latex Itching and Rash   Lidocaine Swelling    Facial swelling from a dentist Facial swelling from a dentist Facial swelling from a dentist    Atorvastatin     Severe hip pain   Losartan Swelling   Metronidazole Other (See Comments)    Sore joints Sore joints    Oxycodone Nausea Only    History reviewed. No pertinent family history.  Prior to Admission medications   Medication Sig Start Date End Date Taking? Authorizing Provider  albuterol (PROVENTIL) (2.5 MG/3ML) 0.083% nebulizer solution Inhale into the lungs. 11/13/19   [provider]  ascorbic acid (VITAMIN C) 500 MG tablet ascorbic acid (vitamin C) 500 mg tablet   1  by  oral route. 05/08/21   Campbell Lerner, MD  atorvastatin (LIPITOR) 40 MG tablet Take 1 tablet by mouth daily. 06/27/20   [provider]  Calcium Polycarbophil (FIBER-CAPS PO) Take by mouth.    [provider]  cloNIDine (CATAPRES) 0.2 MG tablet Take 0.2 mg by mouth 2 (two) times daily.    [provider]  cyanocobalamin 1000 MCG tablet Take 1,000 mcg by mouth daily.    [provider]  esomeprazole (NEXIUM) 40 MG capsule Take 1 capsule by mouth at bedtime. 05/30/20   [provider]  furosemide (LASIX) 20 MG tablet Take 1 tablet by mouth daily. 09/11/21   [provider]  glucosamine-chondroitin 500-400 MG tablet Take 1 tablet by mouth daily.    [provider]  losartan (COZAAR) 50 MG tablet Take 50 mg by mouth daily.    [provider]  Multiple Vitamins-Minerals (PRESERVISION/LUTEIN) CAPS Take 1 capsule by mouth daily.    [provider]  nitroGLYCERIN (NITROSTAT) 0.4 MG SL tablet Place 1 tablet (0.4 mg total) under the tongue every 5 (five) minutes as needed for chest pain. 11/28/21   Arnetha Courser, MD  Omega-3 Fatty Acids (FISH OIL) 1200 MG CAPS Take 1 capsule by mouth daily.    [provider]  ondansetron (ZOFRAN) 8 MG tablet TAKE 1 TABLET BY MOUTH EVERY 12 HOURS AS NEEDED FOR NAUSEA. 09/29/21   [provider]  OXYGEN Inhale 2 L into the lungs continuous.    [provider]  Pirfenidone (ESBRIET) 267 MG CAPS Take 267 mg by mouth with breakfast, with lunch, and with evening meal. 11/21/19   [provider]  polyethylene glycol powder (GLYCOLAX/MIRALAX) 17 GM/SCOOP powder 1 capful once a day , may use 2 per day if continued constipation 04/22/21   Sherrie Mustache, Roselyn Bering, PA-C  prednisoLONE acetate (PRED FORTE) 1 % ophthalmic suspension prednisolone acetate 1 % eye drops,suspension  INSTILL 1 DROP INTO LEFT EYE 3 TIMES A DAY    [provider]  psyllium (REGULOID) 0.52 g capsule Take 0.52 g by mouth daily.    [provider]  sodium chloride (MURO 128) 5 % ophthalmic solution Place 1 drop into the right eye at bedtime.    [provider]  triamcinolone cream (KENALOG) 0.1 % Apply 1 Application topically 2 (two) times daily as needed. 08/11/21   [provider]   verapamil (CALAN-SR) 180 MG CR tablet Take by mouth. 07/21/20   [provider]  Vitamin D, Ergocalciferol, (DRISDOL) 1.25 MG (50000 UNIT) CAPS capsule Take 50,000 Units by mouth daily.    [provider]  vitamin E 180 MG (400 UNITS) capsule vitamin E 268 mg (400 unit) capsule   400 units by oral route.    [provider]     Vitals:   11/08/22 1655 11/08/22 1845 11/08/22 1900  BP: (!) 200/92 (!) 186/80 (!) 179/72  Pulse: 75 71 72  Resp: 18  20  Temp: 98.3 F (36.8 C)    TempSrc: Oral    SpO2: 94% 97% 100%  Weight: 66.7 kg    Height: 4\' 11"  (1.499 m)     Physical Exam Vitals and nursing note reviewed.  Constitutional:      General: She is not in acute distress.    Interventions: Nasal cannula in place.  HENT:     Head: Normocephalic and atraumatic.     Right Ear: Hearing normal.     Left Ear: Hearing normal.  Nose: Nose normal. No nasal deformity.     Mouth/Throat:     Lips: Pink.     Tongue: No lesions.     Pharynx: Oropharynx is clear.  Eyes:     General: Lids are normal.     Extraocular Movements: Extraocular movements intact.  Cardiovascular:     Rate and Rhythm: Normal rate and regular rhythm.     Heart sounds: Normal heart sounds.  Pulmonary:     Effort: Pulmonary effort is normal.     Breath sounds: Examination of the right-upper field reveals rales. Examination of the left-upper field reveals rales. Examination of the right-middle field reveals rales. Examination of the left-middle field reveals rales. Examination of the right-lower field reveals rales. Examination of the left-lower field reveals rales. Rales present.  Abdominal:     General: Bowel sounds are normal. There is no distension.     Palpations: Abdomen is soft. There is no mass.     Tenderness: There is no abdominal tenderness.  Musculoskeletal:     Right lower leg: No edema.     Left lower leg: No edema.  Skin:    General: Skin is warm.  Neurological:      General: No focal deficit present.     Mental Status: She is alert and oriented to person, place, and time.     Cranial Nerves: Cranial nerves 2-12 are intact.  Psychiatric:        Attention and Perception: Attention normal.        Mood and Affect: Mood normal.        Speech: Speech normal.        Behavior: Behavior normal. Behavior is cooperative.   Labs on Admission: I have personally reviewed following labs and imaging studies  CBC: Recent Labs  Lab 11/08/22 1659  WBC 11.0*  HGB 14.8  HCT 43.2  MCV 85.7  PLT 349   Basic Metabolic Panel: Recent Labs  Lab 11/08/22 1659  NA 127*  K 4.5  CL 91*  CO2 25  GLUCOSE 108*  BUN 19  CREATININE 0.81  CALCIUM 9.3   GFR: Estimated Creatinine Clearance: 42.9 mL/min (by C-G formula based on SCr of 0.81 mg/dL). Liver Function Tests: Recent Labs  Lab 11/08/22 1830  AST 25  ALT 25  ALKPHOS 85  BILITOT 0.5  PROT 8.3*  ALBUMIN 4.5   No results for input(s): "LIPASE", "AMYLASE" in the last 168 hours. No results for input(s): "AMMONIA" in the last 168 hours. Coagulation Profile: No results for input(s): "INR", "PROTIME" in the last 168 hours. Cardiac Enzymes: No results for input(s): "CKTOTAL", "CKMB", "CKMBINDEX", "TROPONINI" in the last 168 hours. BNP (last 3 results) No results for input(s): "PROBNP" in the last 8760 hours. HbA1C: No results for input(s): "HGBA1C" in the last 72 hours. CBG: No results for input(s): "GLUCAP" in the last 168 hours. Lipid Profile: No results for input(s): "CHOL", "HDL", "LDLCALC", "TRIG", "CHOLHDL", "LDLDIRECT" in the last 72 hours. Thyroid Function Tests: No results for input(s): "TSH", "T4TOTAL", "FREET4", "T3FREE", "THYROIDAB" in the last 72 hours. Anemia Panel: No results for input(s): "VITAMINB12", "FOLATE", "FERRITIN", "TIBC", "IRON", "RETICCTPCT" in the last 72 hours. Urinalysis No results found for: "COLORURINE", "APPEARANCEUR", "LABSPEC", "PHURINE", "GLUCOSEU", "HGBUR",  "BILIRUBINUR", "KETONESUR", "PROTEINUR", "UROBILINOGEN", "NITRITE", "LEUKOCYTESUR"  Unresulted Labs (From admission, onward)     Start     Ordered   11/09/22 0500  Comprehensive metabolic panel  Tomorrow morning,   R        11/08/22 2254  11/09/22 0500  CBC  Tomorrow morning,   R        11/08/22 2254   11/08/22 2250  TSH  Once,   R        11/08/22 2254   11/08/22 2250  T4, free  Add-on,   AD        11/08/22 2254   11/08/22 2249  CBC  (heparin)  Once,   R       Comments: Baseline for heparin therapy IF NOT ALREADY DRAWN.  Notify MD if PLT < 100 K.    11/08/22 2254   11/08/22 2249  Creatinine, serum  (heparin)  Once,   R       Comments: Baseline for heparin therapy IF NOT ALREADY DRAWN.    11/08/22 2254   11/08/22 2247  Hemoglobin A1c  Add-on,   AD        11/08/22 2254   11/08/22 1824  Blood culture (single)  Once,   STAT        11/08/22 1823            Medications  albuterol (PROVENTIL) (2.5 MG/3ML) 0.083% nebulizer solution 2.5 mg (has no administration in time range)  atorvastatin (LIPITOR) tablet 40 mg (has no administration in time range)  cloNIDine (CATAPRES) tablet 0.2 mg (has no administration in time range)  pantoprazole (PROTONIX) EC tablet 40 mg (has no administration in time range)  losartan (COZAAR) tablet 50 mg (has no administration in time range)  Pirfenidone CAPS 267 mg (has no administration in time range)  verapamil (CALAN-SR) CR tablet 180 mg (has no administration in time range)  ondansetron (ZOFRAN) tablet 8 mg (has no administration in time range)  nitroGLYCERIN (NITROSTAT) SL tablet 0.4 mg (has no administration in time range)  sodium chloride flush (NS) 0.9 % injection 3 mL (has no administration in time range)  acetaminophen (TYLENOL) tablet 650 mg (has no administration in time range)    Or  acetaminophen (TYLENOL) suppository 650 mg (has no administration in time range)  heparin injection 5,000 Units (has no administration in time range)   methylPREDNISolone sodium succinate (SOLU-MEDROL) 125 mg/2 mL injection 80 mg (has no administration in time range)  methylPREDNISolone sodium succinate (SOLU-MEDROL) 125 mg/2 mL injection 125 mg (125 mg Intravenous Given 11/08/22 1857)  ipratropium-albuterol (DUONEB) 0.5-2.5 (3) MG/3ML nebulizer solution 3 mL (3 mLs Nebulization Given 11/08/22 1856)  ipratropium-albuterol (DUONEB) 0.5-2.5 (3) MG/3ML nebulizer solution 3 mL (3 mLs Nebulization Given 11/08/22 1855)  iohexol (OMNIPAQUE) 350 MG/ML injection 75 mL (75 mLs Intravenous Contrast Given 11/08/22 1949)    Radiological Exams on Admission: CT Angio Chest PE W and/or Wo Contrast  Result Date: 11/08/2022 CLINICAL DATA:  Shortness of breath and positive D-dimer. Concern for pulmonary embolism. EXAM: CT ANGIOGRAPHY CHEST WITH CONTRAST TECHNIQUE: Multidetector CT imaging of the chest was performed using the standard protocol during bolus administration of intravenous contrast. Multiplanar CT image reconstructions and MIPs were obtained to evaluate the vascular anatomy. RADIATION DOSE REDUCTION: This exam was performed according to the departmental dose-optimization program which includes automated exposure control, adjustment of the mA and/or kV according to patient size and/or use of iterative reconstruction technique. CONTRAST:  75mL OMNIPAQUE IOHEXOL 350 MG/ML SOLN COMPARISON:  Chest radiograph dated 11/08/2022. CT dated 11/26/2021. FINDINGS: Cardiovascular: Mild cardiomegaly. No pericardial effusion. There is moderate atherosclerotic calcification of the thoracic aorta. No aneurysmal dilatation or dissection. The origins of the great vessels of the aortic arch appear patent. There is dilatation of the  main pulmonary trunk and bilateral pulmonary arteries suggestive of pulmonary hypertension. Evaluation of the pulmonary arteries is limited due to respiratory motion. Apparent tiny nonocclusive subsegmental defect in the pulmonary artery branch of  lingula (192/6) may be artifactual and related to volume averaging or represent an old PE/scarring. No definite acute pulmonary artery embolus identified. Mediastinum/Nodes: No hilar or mediastinal adenopathy. The esophagus is grossly unremarkable. No mediastinal fluid collection. Lungs/Pleura: Background of emphysema and diffuse chronic interstitial coarsening. Bilateral mosaic attenuation of the lungs likely atelectasis. No focal consolidation, pleural effusion, or pneumothorax. Right upper lobe subpleural cystic changes. The central airways are patent. Upper Abdomen: Partially visualized bilateral renal cysts. Musculoskeletal: No acute osseous pathology. Review of the MIP images confirms the above findings. IMPRESSION: 1. No acute pulmonary artery embolus. 2. Mild cardiomegaly and findings of pulmonary hypertension. 3. Aortic Atherosclerosis (ICD10-I70.0) and Emphysema (ICD10-J43.9). Electronically Signed   By: Elgie Collard M.D.   On: 11/08/2022 22:48   DG Chest 2 View  Result Date: 11/08/2022 CLINICAL DATA:  Shortness of breath EXAM: CHEST - 2 VIEW COMPARISON:  Chest x-ray 05/24/2022 FINDINGS: There is minimal bibasilar atelectasis. There is no pleural effusion or pneumothorax. The cardiomediastinal silhouette is stable, the heart is mildly enlarged. No acute fractures are seen. IMPRESSION: Minimal bibasilar atelectasis. Electronically Signed   By: Darliss Cheney M.D.   On: 11/08/2022 20:11     Data Reviewed: Relevant notes from primary care and specialist visits, past discharge summaries as available in EHR, including Care Everywhere. Prior diagnostic testing as pertinent to current admission diagnoses Updated medications and problem lists for reconciliation ED course, including vitals, labs, imaging, treatment and response to treatment Triage notes, nursing and pharmacy notes and ED provider's notes Notable results as noted in HPI  Assessment and Plan: * SOB / IPF/ C/H respiratory failure  on oxygen @ 3LNC at home. 2/2 to IPF. We will continue duonebs , PRN albuterol solumedrol.  Pulmonary consult.  Cardiology consult.  Cont with cont pulse oximetry.    Paroxysmal atrial tachycardia (HCC) Continue verapamil. CTA pending. Will defer to cardiology for anticoagulation consideration.     Benign essential hypertension Vitals:   11/08/22 1655 11/08/22 1845 11/08/22 1900  BP: (!) 200/92 (!) 186/80 (!) 179/72  Cont clonidine, Cozaar, verapamil. As needed hydralazine.  Chronic hyponatremia Attribute to patient's furosemide. Mild and chronic will monitor.  No neurological symptoms.   DVT prophylaxis:  Heparin q 12,  Consults:  Cardiology: Dr. Melton Alar.  Pulmonology: Dr. Karna Christmas.  Advance Care Planning:    Code Status: Full Code   Family Communication:  None   Disposition Plan:  Home   Severity of Illness: The appropriate patient status for this patient is INPATIENT. Inpatient status is judged to be reasonable and necessary in order to provide the required intensity of service to ensure the patient's safety. The patient's presenting symptoms, physical exam findings, and initial radiographic and laboratory data in the context of their chronic comorbidities is felt to place them at high risk for further clinical deterioration. Furthermore, it is not anticipated that the patient will be medically stable for discharge from the hospital within 2 midnights of admission.   * I certify that at the point of admission it is my clinical judgment that the patient will require inpatient hospital care spanning beyond 2 midnights from the point of admission due to high intensity of service, high risk for further deterioration and high frequency of surveillance required.*  Author: Gertha Calkin, MD 11/08/2022 11:21 PM  For on call review [email or web address].

## 2022-11-08 NOTE — Assessment & Plan Note (Signed)
2/2 to IPF. We will continue duonebs , PRN albuterol solumedrol.  Pulmonary consult.  Cardiology consult.  Cont with cont pulse oximetry.

## 2022-11-08 NOTE — ED Notes (Signed)
Pt ambulated to bathroom with steady gate

## 2022-11-08 NOTE — Assessment & Plan Note (Signed)
Attribute to patient's furosemide. Mild and chronic will monitor.  No neurological symptoms.

## 2022-11-08 NOTE — ED Provider Notes (Signed)
[facility] Provider Note    Event Date/Time   First MD Initiated Contact with Patient 11/08/22 1742     (approximate)   History   Shortness of Breath   HPI  Susan Sosa is a 82 y.o. 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 Pt reports 2 weeks of progressively worsening SOB. Was seen by Dr. Karna Christmas several days ago and placed on steroids, abx. Has not noticed any improvement. She also had a D-Dimer sent which returned at 0.55 so was told to come in . She reports she has had progressive worsening of her SOB despite tx. She has been desatting to the 50s w/ ambulation. No fevers. She has had slight increase in sputum production.      Physical Exam   Triage Vital Signs: ED Triage Vitals [11/08/22 1655]  Encounter Vitals Group     BP (!) 200/92     Systolic BP Percentile      Diastolic BP Percentile      Pulse Rate 75     Resp 18     Temp 98.3 F (36.8 C)     Temp Source Oral     SpO2 94 %     Weight 147 lb (66.7 kg)     Height 4\' 11"  (1.499 m)     Head Circumference      Peak Flow      Pain Score 0     Pain Loc      Pain Education      Exclude from Growth Chart     Most recent vital signs: Vitals:   11/08/22 1900 11/08/22 2330  BP: (!) 179/72 (!) 179/93  Pulse: 72 86  Resp: 20 (!) 28  Temp:    SpO2: 100% 91%     General: Awake, no distress.  CV:  Good peripheral perfusion. RRR. Resp:  Tachypneic, withj bilateral course breath sounds, mild rales in bilateral bases. Abd:  No distention. No tenderness. Other:  No edema.   ED Results / Procedures / Treatments   Labs (all labs ordered are listed, but only abnormal results are displayed) Labs Reviewed  BASIC METABOLIC PANEL - Abnormal; Notable for the following components:      Result Value   Sodium 127 (*)    Chloride 91 (*)    Glucose, Bld 108 (*)    All other components within normal limits  CBC - Abnormal; Notable for the following components:   WBC 11.0  (*)    All other components within normal limits  BRAIN NATRIURETIC PEPTIDE - Abnormal; Notable for the following components:   B Natriuretic Peptide 158.1 (*)    All other components within normal limits  HEPATIC FUNCTION PANEL - Abnormal; Notable for the following components:   Total Protein 8.3 (*)    All other components within normal limits  SARS CORONAVIRUS 2 BY RT PCR  CULTURE, BLOOD (SINGLE)  PROCALCITONIN  CBC  HEMOGLOBIN A1C  CREATININE, SERUM  TSH  T4, FREE  COMPREHENSIVE METABOLIC PANEL  CBC  TROPONIN I (HIGH SENSITIVITY)  TROPONIN I (HIGH SENSITIVITY)     EKG Normal sinus rhythm, VR    RADIOLOGY CXR: Bibasilar atelectasis CT Angio: No acute abnormality, pulm HTN   I also independently reviewed and agree with radiologist interpretations.   PROCEDURES:  Critical Care performed: No   MEDICATIONS ORDERED IN ED: Medications  albuterol (PROVENTIL) (2.5 MG/3ML) 0.083% nebulizer solution 2.5 mg (has no administration in  time range)  atorvastatin (LIPITOR) tablet 40 mg (has no administration in time range)  cloNIDine (CATAPRES) tablet 0.2 mg (has no administration in time range)  pantoprazole (PROTONIX) EC tablet 40 mg (has no administration in time range)  losartan (COZAAR) tablet 50 mg (has no administration in time range)  Pirfenidone CAPS 267 mg (has no administration in time range)  verapamil (CALAN-SR) CR tablet 180 mg (has no administration in time range)  ondansetron (ZOFRAN) tablet 8 mg (has no administration in time range)  nitroGLYCERIN (NITROSTAT) SL tablet 0.4 mg (has no administration in time range)  sodium chloride flush (NS) 0.9 % injection 3 mL (has no administration in time range)  acetaminophen (TYLENOL) tablet 650 mg (has no administration in time range)    Or  acetaminophen (TYLENOL) suppository 650 mg (has no administration in time range)  heparin injection 5,000 Units (has no administration in time range)  methylPREDNISolone sodium  succinate (SOLU-MEDROL) 125 mg/2 mL injection 80 mg (has no administration in time range)  methylPREDNISolone sodium succinate (SOLU-MEDROL) 125 mg/2 mL injection 125 mg (125 mg Intravenous Given 11/08/22 1857)  ipratropium-albuterol (DUONEB) 0.5-2.5 (3) MG/3ML nebulizer solution 3 mL (3 mLs Nebulization Given 11/08/22 1856)  ipratropium-albuterol (DUONEB) 0.5-2.5 (3) MG/3ML nebulizer solution 3 mL (3 mLs Nebulization Given 11/08/22 1855)  iohexol (OMNIPAQUE) 350 MG/ML injection 75 mL (75 mLs Intravenous Contrast Given 11/08/22 1949)     IMPRESSION / MDM / ASSESSMENT AND PLAN / ED COURSE  I reviewed the triage vital signs and the nursing notes.                              Differential diagnosis includes, but is not limited to, IPF exacerbation, CAP, bronchitis, CHF, PE, ACS, COVID-19 or other viral URI, anemia  Patient's presentation is most consistent with acute presentation with potential threat to life or bodily function.  The patient is on the cardiac monitor to evaluate for evidence of arrhythmia and/or significant heart rate changes   82 yo F with h/o IPF, CHF, HTN, here with acute on chronic SOB and hypoxia. Suspect exacerbation of her chronic IPF, versus possibly CHF though clinically does not appear hypervolemic. CXR shows no PNA. Pt has had to increase to 4L [place]. Labs show mild acute on chronic hyponatremia - no sx otherwise from this. CBC with mild leukocytosis. Trop negative. EKG nonischemic. Will admit for steroids, breathing tx, increased O2 requirement.   FINAL CLINICAL IMPRESSION(S) / ED DIAGNOSES   Final diagnoses:  Acute on chronic respiratory failure with hypoxia (HCC)     Rx / DC Orders   ED Discharge Orders     None        Note:  This document was prepared using Dragon voice recognition software and may include unintentional dictation errors.   Shaune Pollack, MD 11/08/22 [phone]

## 2022-11-08 NOTE — ED Notes (Signed)
Pt ambulated to bedside commode with a steady gate.

## 2022-11-08 NOTE — Assessment & Plan Note (Signed)
Vitals:   11/08/22 1655 11/08/22 1845 11/08/22 1900  BP: (!) 200/92 (!) 186/80 (!) 179/72  Cont clonidine, Cozaar, verapamil. As needed hydralazine.

## 2022-11-08 NOTE — Assessment & Plan Note (Signed)
Continue verapamil. CTA pending. Will defer to cardiology for anticoagulation consideration.

## 2022-11-08 NOTE — ED Triage Notes (Addendum)
Pt reports SHOB since Friday. PCP sent here for positive Dimer. 2L O2 at baseline. Pt has been wearing 3L for increased SHOB.

## 2022-11-09 ENCOUNTER — Encounter: Payer: Self-pay | Admitting: Internal Medicine

## 2022-11-09 ENCOUNTER — Other Ambulatory Visit: Payer: Self-pay

## 2022-11-09 DIAGNOSIS — J84112 Idiopathic pulmonary fibrosis: Secondary | ICD-10-CM | POA: Diagnosis not present

## 2022-11-09 DIAGNOSIS — R0602 Shortness of breath: Secondary | ICD-10-CM | POA: Diagnosis not present

## 2022-11-09 LAB — CREATININE, SERUM
Creatinine, Ser: 0.73 mg/dL (ref 0.44–1.00)
GFR, Estimated: >60 mL/min (ref >60)

## 2022-11-09 LAB — CBC
HCT: 41.8 % (ref 36.0–46.0)
Hemoglobin: 14.4 g/dL (ref 12.0–15.0)
MCH: 28.9 pg (ref 26.0–34.0)
MCHC: 34.4 g/dL (ref 30.0–36.0)
MCV: 83.9 fL (ref 80.0–100.0)
Platelets: 339 10*3/uL (ref 150–400)
RBC: 4.98 MIL/uL (ref 3.87–5.11)
RDW: 13.7 % (ref 11.5–15.5)
WBC: 10.3 10*3/uL (ref 4.0–10.5)
nRBC: 0 % (ref 0.0–0.2)

## 2022-11-09 LAB — HEMOGLOBIN A1C
Hgb A1c MFr Bld: 5.7 % — ABNORMAL HIGH (ref 4.8–5.6)
Mean Plasma Glucose: 116.89 mg/dL

## 2022-11-09 LAB — COMPREHENSIVE METABOLIC PANEL
ALT: 26 U/L (ref 0–44)
AST: 25 U/L (ref 15–41)
Albumin: 4.3 g/dL (ref 3.5–5.0)
Alkaline Phosphatase: 82 U/L (ref 38–126)
Anion gap: 14 (ref 5–15)
BUN: 21 mg/dL (ref 8–23)
CO2: 25 mmol/L (ref 22–32)
Calcium: 9.2 mg/dL (ref 8.9–10.3)
Chloride: 92 mmol/L — ABNORMAL LOW (ref 98–111)
Creatinine, Ser: 0.84 mg/dL (ref 0.44–1.00)
GFR, Estimated: >60 mL/min (ref >60)
Glucose, Bld: 155 mg/dL — ABNORMAL HIGH (ref 70–99)
Potassium: 4.4 mmol/L (ref 3.5–5.1)
Sodium: 131 mmol/L — ABNORMAL LOW (ref 135–145)
Total Bilirubin: 0.8 mg/dL (ref 0.3–1.2)
Total Protein: 8.3 g/dL — ABNORMAL HIGH (ref 6.5–8.1)

## 2022-11-09 LAB — TSH: TSH: 1.193 u[IU]/mL (ref 0.350–4.500)

## 2022-11-09 LAB — T4, FREE: Free T4: 0.73 ng/dL (ref 0.61–1.12)

## 2022-11-09 MED ORDER — FUROSEMIDE 20 MG PO TABS
20.0000 mg | ORAL_TABLET | Freq: Every day | ORAL | Status: DC
  Administered 2022-11-10: 20 mg via ORAL
  Filled 2022-11-09: qty 1

## 2022-11-09 MED ORDER — LOSARTAN POTASSIUM 50 MG PO TABS
50.0000 mg | ORAL_TABLET | Freq: Every day | ORAL | Status: DC
  Administered 2022-11-09 – 2022-11-10 (×2): 50 mg via ORAL
  Filled 2022-11-09 (×2): qty 1

## 2022-11-09 NOTE — Evaluation (Signed)
Physical Therapy Evaluation Patient Details Name: Susan Sosa MRN: [id number] DOB: [birth year] Today's Date: 11/09/2022  History of Present Illness  presented to ER secondary to progressive SOB; admitted for management of acute/chronic respiratory failure secondary to IPF.  Clinical Impression  Patient resting in bed upon arrival to room; supportive husband present at bedside throughout session.  Patient alert and oriented; follows commands and eager for mobility, progression towards discharge home.  Denies pain; does endorse generalized weakness due to acute illness and limited OOB since admission.  Bilat UE/LE strength and ROM grossly symmetrical and WFL; no focal weakness appreciated.  Able to complete bed mobility with mod indep; sit/stand, basic transfers and gait (75' x2) with RW, cga/close sup. Demonstrates reciprocal stepping pattern with fair step height/length; fair cadence. Mild SOB with gait efforts, sats 87% on 2L that quickly recover >90% with intermittent rest/pursed lip breathing.  Do recommend continued use of RW at this time for overall energy conservation. Would benefit from skilled PT to address above deficits and promote optimal return to PLOF.; recommend post-acute PT follow up as indicated by interdisciplinary care team.           If plan is discharge home, recommend the following: A little help with walking and/or transfers;A little help with bathing/dressing/bathroom   Can travel by private vehicle        Equipment Recommendations Rolling walker (2 wheels) (youth)  Recommendations for Other Services       Functional Status Assessment Patient has had a recent decline in their functional status and demonstrates the ability to make significant improvements in function in a reasonable and predictable amount of time.     Precautions / Restrictions Precautions Precautions: None Restrictions Weight Bearing Restrictions: No      Mobility  Bed Mobility Overal bed  mobility: Modified Independent                  Transfers Overall transfer level: Needs assistance Equipment used: Rolling walker (2 wheels) Transfers: Sit to/from Stand Sit to Stand: Supervision                Ambulation/Gait Ambulation/Gait assistance: Contact guard assist, Supervision Gait Distance (Feet): 75 Feet (x2) Assistive device: Rolling walker (2 wheels)         General Gait Details: reciprocal stepping pattern with fair step height/length; fair cadence. Mild SOB with gait efforts, sats 87% on 2L that quickly recover >90% with intermittent rest/pursed lip breathing  Stairs            Wheelchair Mobility     Tilt Bed    Modified Rankin (Stroke Patients Only)       Balance Overall balance assessment: Needs assistance Sitting-balance support: No upper extremity supported, Feet supported Sitting balance-Leahy Scale: Normal     Standing balance support: Bilateral upper extremity supported Standing balance-Leahy Scale: Good                               Pertinent Vitals/Pain Pain Assessment Pain Assessment: No/denies pain    Home Living Family/patient expects to be discharged to:: Private residence Living Arrangements: Spouse/significant other Available Help at Discharge: Family;Available 24 hours/day Type of Home: House Home Access: Stairs to enter Entrance Stairs-Rails: Left Entrance Stairs-Number of Steps: 4   Home Layout: One level Home Equipment: Grab bars - tub/shower;Grab bars - toilet;Shower seat - built in      Prior Function Prior Level of Function : Independent/Modified  Independent;Driving             Mobility Comments: IND for household and community distances without an AD, walks in the mall almost everyday, uses treadmill (has done less recently since helping husband recover from lobectomy/current chemo) ADLs Comments: IND for ADls/IADLs, still driving     Extremity/Trunk Assessment   Upper  Extremity Assessment Upper Extremity Assessment: Overall WFL for tasks assessed    Lower Extremity Assessment Lower Extremity Assessment: Overall WFL for tasks assessed (grossly at least 4+/5 throughout; endorses "bad knee" (R > L) with chronic, arthritic changes)       Communication      Cognition Arousal: Alert Behavior During Therapy: WFL for tasks assessed/performed Overall Cognitive Status: Within Functional Limits for tasks assessed                                          General Comments      Exercises Other Exercises Other Exercises: Toilet transfer, ambulatory with RW, close sup; sit/stand from standard toilet with grab bar, close sup; standing balance for peri-care, clothing management and hygiene, close sup.  good awareness of safety needs; good use of equipment/environmental support and compensatory strategies as needed.   Assessment/Plan    PT Assessment Patient needs continued PT services  PT Problem List Decreased activity tolerance;Decreased balance;Decreased mobility;Decreased safety awareness;Decreased knowledge of use of DME;Decreased knowledge of precautions;Cardiopulmonary status limiting activity       PT Treatment Interventions DME instruction;Gait training;Stair training;Functional mobility training;Therapeutic activities;Therapeutic exercise;Balance training;Patient/family education    PT Goals (Current goals can be found in the Care Plan section)  Acute Rehab PT Goals Patient Stated Goal: to get better and go home PT Goal Formulation: With patient/family Time For Goal Achievement: 11/23/22 Potential to Achieve Goals: Good    Frequency Min 1X/week     Co-evaluation               AM-PAC PT "6 Clicks" Mobility  Outcome Measure Help needed turning from your back to your side while in a flat bed without using bedrails?: None Help needed moving from lying on your back to sitting on the side of a flat bed without using  bedrails?: None Help needed moving to and from a bed to a chair (including a wheelchair)?: None Help needed standing up from a chair using your arms (e.g., wheelchair or bedside chair)?: None Help needed to walk in hospital room?: A Little Help needed climbing 3-5 steps with a railing? : A Little 6 Click Score: 22    End of Session Equipment Utilized During Treatment: Gait belt;Oxygen Activity Tolerance: Patient tolerated treatment well Patient left: in bed;with call bell/phone within reach;with family/visitor present Nurse Communication: Mobility status PT Visit Diagnosis: Difficulty in walking, not elsewhere classified (R26.2)    Time: 6387-5643 PT Time Calculation (min) (ACUTE ONLY): 19 min   Charges:   PT Evaluation $PT Eval Low Complexity: 1 Low   PT General Charges $$ ACUTE PT VISIT: 1 Visit        Valerio Pinard H. Manson Passey, PT, DPT, NCS 11/09/22, 11:22 AM [phone]

## 2022-11-09 NOTE — Progress Notes (Incomplete)
[facility] HF Stewardship  PCP: Marisue Ivan, MD  PCP-Cardiologist: None  HPI: Susan Sosa is a 82 y.o. female with essential hypertension, paroxysmal atrial tachycardia, coronary artery disease, history of NSTEMI (November 2023), hyperlipidemia, Idiopathic pulmonary fibrosis and chronic respiratory failure with hypoxia on 2 L nasal cannula at baseline, chronic hyponatremia, moderate nonischemic dilated cardiomyopathy  who presented with shortness of breath. Troponin on admission was WNL. BNP was mildly elevated at 158.  Pertinent cardiac history: TTE 08/2021 showed LVEF >55%. 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 LVEF was noted to be 35-40%. 2D echocardiogram 04/02/2022 revealed normal left ventricular function, with LVEF greater than 55%   Patient noted to have been on losartan in the past with tongue swelling, however symptoms persisted after discontinuation.  Pertinent Lab Values: Creatinine, Ser  Date Value Ref Range Status  11/09/2022 0.84 0.44 - 1.00 mg/dL Final   BUN  Date Value Ref Range Status  11/09/2022 21 8 - 23 mg/dL Final   Potassium  Date Value Ref Range Status  11/09/2022 4.4 3.5 - 5.1 mmol/L Final   Sodium  Date Value Ref Range Status  11/09/2022 131 (L) 135 - 145 mmol/L Final   B Natriuretic Peptide  Date Value Ref Range Status  11/08/2022 158.1 (H) 0.0 - 100.0 pg/mL Final    Comment:    Performed at [facility], [address], [place], [place] [address]   Magnesium  Date Value Ref Range Status  05/24/2022 1.7 1.7 - 2.4 mg/dL Final    Comment:    Performed at [facility], [address] [place], [place] [address]   Hgb A1c MFr Bld  Date Value Ref Range Status  11/08/2022 5.7 (H) 4.8 - 5.6 % Final    Comment:    (NOTE) Pre diabetes:          5.7%-6.4%  Diabetes:              >6.4%  Glycemic control for   <7.0% adults with diabetes    TSH  Date  Value Ref Range Status  11/08/2022 1.193 0.350 - 4.500 uIU/mL Final    Comment:    Performed by a 3rd Generation assay with a functional sensitivity of <=0.01 uIU/mL. Performed at [facility], [address] [place], [place] [address]     Vital Signs: Temp:  [98.2 F (36.8 C)-98.3 F (36.8 C)] 98.3 F (36.8 C) (10/15 0400) Pulse Rate:  [65-86] 65 (10/15 0400) Resp:  [18-28] 20 (10/15 0400) BP: (130-200)/(69-100) 130/69 (10/15 0400) SpO2:  [91 %-100 %] 98 % (10/15 0400) Weight:  [66.7 kg (147 lb)] 66.7 kg (147 lb) (10/14 1655)  No intake or output data in the 24 hours ending 11/09/22 0737  Current Inpatient Medications:                 Prior to admission HF Medications:                 Assessment: 1. {CHL AMB Acute or Chronic:[id number]}  -  Plan: 1) Medication changes recommended at this time:              2) Patient assistance:  3) Education: -To be completed prior to discharge.  ***  Medication Assistance / Insurance Benefits Check:  Does the patient have prescription insurance?    Type of insurance plan:   Does the patient qualify for medication assistance through manufacturers or grants? {CHL AMB Yes/No/Pending:[id number]}   Eligible grants and/or patient assistance programs: ***   Medication assistance applications in progress: ***   Medication assistance applications approved: ***  Approved medication assistance renewals will be completed by: ***  Outpatient Pharmacy:  Prior to admission outpatient pharmacy: ***        ***

## 2022-11-09 NOTE — Progress Notes (Addendum)
Progress Note    Susan Sosa  ZOX:[id number] DOB: [birth year]  DOA: 11/08/2022 PCP: Marisue Ivan, MD      Brief Narrative:    Medical records reviewed and are as summarized below:  Susan Sosa is a 82 y.o. 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  She presented to the hospital with increasing shortness of breath with exertion and decreased oxygen saturation.  Her husband said oxygen saturation dropped into the 60s on 2 L oxygen with minimal exertion.  BP was 200/92 in the emergency department.      Assessment/Plan:   Principal Problem:   Idiopathic pulmonary fibrosis (HCC) Active Problems:   Benign essential hypertension   Paroxysmal atrial tachycardia (HCC)   Chronic hyponatremia   Body mass index is 29.69 kg/m.   Acute exacerbation of idiopathic pulmonary fibrosis: Continue IV steroids.  Follow-up with pulmonologist. Emphysema/COPD: Noted on CT chest CTA chest negative for acute pulmonary embolism or pneumonia. Appreciate input from cardiologist.  2D echo is pending.  No further cardiac workup at this point.   Acute on chronic hypoxemic respiratory failure: She is on 2 L/min oxygen at the moment.   Left upper back pain for a few days, midsternal chest pain on and off for about a year: Suspect musculoskeletal pain.  Troponins negative.  Normal cardiac cath in November 2023. Analgesics as needed for pain.   Paroxysmal atrial tachycardia: Continue verapamil   Hypertensive urgency but now hypotensive: BP is on the low side today.  Hold clonidine.  Continue losartan and verapamil for now.  Adjust antihypertensives as  needed.   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  Remote history of tobacco use disorder: Smoked a pack of cigarettes for about 25 years and quit smoking about 20 years ago.   Diet Order             Diet Heart Room service appropriate? Yes; Fluid consistency: Thin; Fluid restriction: 1500 mL Fluid  Diet effective now                            Consultants: Pulmonologist Cardiologist  Procedures: None    Medications:    atorvastatin  40 mg Oral Daily   heparin  5,000 Units Subcutaneous Q8H   losartan  50 mg Oral Daily   methylPREDNISolone (SOLU-MEDROL) injection  80 mg Intravenous Q12H   ondansetron  8 mg Oral Once   pantoprazole  40 mg Oral Daily   Pirfenidone  267 mg Oral TID with meals   sodium chloride flush  3 mL Intravenous Q12H   verapamil  180 mg Oral QHS   Continuous Infusions:   Anti-infectives (From admission, onward)    None              Family Communication/Anticipated D/C date and plan/Code Status   DVT prophylaxis: heparin injection 5,000 Units Start: 11/09/22 0200     Code Status: Full Code  Family Communication: Plan discussed with the husband at the bedside Disposition Plan: Plan to discharge home in 2 to 3 days   Status is: Inpatient Remains inpatient appropriate because: Exacerbation of pulmonary fibrosis  Subjective:   Interval events noted.  She complains of shortness of breath with minimal exertion.  She also complains of pain in the left side of her back that has been going on for a few days now.  Pain on the left side of her back usually gets better when she sits up.  She has had on and off substernal chest pain for about a year.     Objective:    Vitals:   11/09/22 1030 11/09/22 1123 11/09/22 1130 11/09/22 1253  BP: 135/62 (!) 126/59 107/65 (!) 94/56  Pulse: 87 89  78 74  Resp: 18 20 (!) 21 20  Temp:  97.6 F (36.4 C)    TempSrc:  Oral    SpO2: 95% 98% 97% 95%  Weight:      Height:       No data found.  No intake or output data in the 24 hours ending 11/09/22 1353 Filed Weights   11/08/22 1655  Weight: 66.7 kg    Exam:  GEN: NAD SKIN: Warm and dry EYES: No pallor or icterus ENT: MMM CV: RRR PULM: b/l fine rales. No wheezing ABD: soft, ND, NT, +BS CNS: AAO x 3, non focal EXT: No edema or tenderness MSK: No chest wall tenderness       Data Reviewed:   I have personally reviewed following labs and imaging studies:  Labs: Labs show the following:   Basic Metabolic Panel: Recent Labs  Lab 11/08/22 1659 11/08/22 2316 11/09/22 0528  NA 127*  --  131*  K 4.5  --  4.4  CL 91*  --  92*  CO2 25  --  25  GLUCOSE 108*  --  155*  BUN 19  --  21  CREATININE 0.81 0.73 0.84  CALCIUM 9.3  --  9.2   GFR Estimated Creatinine Clearance: 41.4 mL/min (by C-G formula based on SCr of 0.84 mg/dL). Liver Function Tests: Recent Labs  Lab 11/08/22 1830 11/09/22 0528  AST 25 25  ALT 25 26  ALKPHOS 85 82  BILITOT 0.5 0.8  PROT 8.3* 8.3*  ALBUMIN 4.5 4.3   No results for input(s): "LIPASE", "AMYLASE" in the last 168 hours. No results for input(s): "AMMONIA" in the last 168 hours. Coagulation profile No results for input(s): "INR", "PROTIME" in the last 168 hours.  CBC: Recent Labs  Lab 11/08/22 1659 11/08/22 2316 11/09/22 0528  WBC 11.0* 9.6 10.3  HGB 14.8 14.3 14.4  HCT 43.2 41.6 41.8  MCV 85.7 84.2 83.9  PLT 349 320 339   Cardiac Enzymes: No results for input(s): "CKTOTAL", "CKMB", "CKMBINDEX", "TROPONINI" in the last 168 hours. BNP (last 3 results) No results for input(s): "PROBNP" in the last 8760 hours. CBG: No results for input(s): "GLUCAP" in the last 168 hours. D-Dimer: No results for input(s): "DDIMER" in the last 72 hours. Hgb A1c: Recent Labs    11/08/22 2316  HGBA1C 5.7*   Lipid Profile: No  results for input(s): "CHOL", "HDL", "LDLCALC", "TRIG", "CHOLHDL", "LDLDIRECT" in the last 72 hours. Thyroid function studies: Recent Labs    11/08/22 2316  TSH 1.193   Anemia work up: No results for input(s): "VITAMINB12", "FOLATE", "FERRITIN", "TIBC", "IRON", "RETICCTPCT" in the last 72 hours. Sepsis Labs: Recent Labs  Lab 11/08/22 1659 11/08/22 1830 11/08/22 2316 11/09/22 0528  PROCALCITON  --  <0.10  --   --   WBC 11.0*  --  9.6 10.3    Microbiology Recent Results (from the past 240 hour(s))  Blood culture (single)     Status: None (Preliminary result)   Collection Time: 11/08/22  6:30 PM   Specimen: BLOOD  Result Value Ref Range Status   Specimen Description BLOOD BLOOD RIGHT ARM  Final   Special Requests   Final    BOTTLES DRAWN AEROBIC AND ANAEROBIC Blood Culture results may not be optimal due to an excessive volume of blood received in culture bottles   Culture   Final    NO GROWTH < 12 HOURS Performed at [facility], [address], [place], [place] [address]    Report Status PENDING  Incomplete  SARS Coronavirus 2 by RT PCR (hospital order, performed in [facility] hospital lab) *cepheid single result test* Anterior Nasal Swab     Status: None   Collection Time: 11/08/22  6:30 PM   Specimen: Anterior Nasal Swab  Result Value Ref Range Status   SARS Coronavirus 2 by RT PCR NEGATIVE NEGATIVE Final    Comment: (NOTE) SARS-CoV-2 target nucleic acids are NOT DETECTED.  The SARS-CoV-2 RNA is generally detectable in upper and lower respiratory specimens during the acute phase of infection. The lowest concentration of SARS-CoV-2 viral copies this assay can detect is 250 copies / mL. A negative result does not preclude SARS-CoV-2 infection and should not be used as the sole basis for treatment or other patient management decisions.  A negative result may occur with improper specimen collection / handling, submission of specimen other than nasopharyngeal  swab, presence of viral mutation(s) within the areas targeted by this assay, and inadequate number of viral copies (<250 copies / mL). A negative result must be combined with clinical observations, patient history, and epidemiological information.  Fact Sheet for Patients:   RoadLapTop.co.za  Fact Sheet for Healthcare Providers: http://kim-miller.com/  This test is not yet approved or  cleared by the [place] FDA and has been authorized for detection and/or diagnosis of SARS-CoV-2 by FDA under an Emergency Use Authorization (EUA).  This EUA will remain in effect (meaning this test can be used) for the duration of the COVID-19 declaration under Section 564(b)(1) of the Act, 21 U.S.C. section 360bbb-3(b)(1), unless the authorization is terminated or revoked sooner.  Performed at [facility], [address] [place], [place] [address]     Procedures and diagnostic studies:  CT Angio Chest PE W and/or Wo Contrast  Result Date: 11/08/2022 CLINICAL DATA:  Shortness of breath and positive D-dimer. Concern for pulmonary embolism. EXAM: CT ANGIOGRAPHY CHEST WITH CONTRAST TECHNIQUE: Multidetector CT imaging of the chest was performed using the standard protocol during bolus administration of intravenous contrast. Multiplanar CT image reconstructions and MIPs were obtained to evaluate the vascular anatomy. RADIATION DOSE REDUCTION: This exam was performed according to the departmental dose-optimization program which includes automated exposure control, adjustment of the mA and/or kV according to patient size and/or use of iterative reconstruction technique. CONTRAST:  75mL OMNIPAQUE IOHEXOL 350 MG/ML SOLN COMPARISON:  Chest radiograph dated 11/08/2022. CT dated 11/26/2021. FINDINGS: Cardiovascular: Mild cardiomegaly. No pericardial effusion. There is moderate atherosclerotic calcification of the thoracic aorta. No aneurysmal dilatation or  dissection. The origins of the great vessels of the aortic arch appear patent. There is dilatation of the main pulmonary trunk and bilateral pulmonary arteries suggestive of pulmonary hypertension. Evaluation of the pulmonary arteries is limited due to respiratory motion. Apparent tiny nonocclusive subsegmental defect in the pulmonary artery branch of lingula (192/6) may be artifactual and related to volume averaging or represent an old PE/scarring.  No definite acute pulmonary artery embolus identified. Mediastinum/Nodes: No hilar or mediastinal adenopathy. The esophagus is grossly unremarkable. No mediastinal fluid collection. Lungs/Pleura: Background of emphysema and diffuse chronic interstitial coarsening. Bilateral mosaic attenuation of the lungs likely atelectasis. No focal consolidation, pleural effusion, or pneumothorax. Right upper lobe subpleural cystic changes. The central airways are patent. Upper Abdomen: Partially visualized bilateral renal cysts. Musculoskeletal: No acute osseous pathology. Review of the MIP images confirms the above findings. IMPRESSION: 1. No acute pulmonary artery embolus. 2. Mild cardiomegaly and findings of pulmonary hypertension. 3. Aortic Atherosclerosis (ICD10-I70.0) and Emphysema (ICD10-J43.9). Electronically Signed   By: Elgie Collard M.D.   On: 11/08/2022 22:48   DG Chest 2 View  Result Date: 11/08/2022 CLINICAL DATA:  Shortness of breath EXAM: CHEST - 2 VIEW COMPARISON:  Chest x-ray 05/24/2022 FINDINGS: There is minimal bibasilar atelectasis. There is no pleural effusion or pneumothorax. The cardiomediastinal silhouette is stable, the heart is mildly enlarged. No acute fractures are seen. IMPRESSION: Minimal bibasilar atelectasis. Electronically Signed   By: Darliss Cheney M.D.   On: 11/08/2022 20:11               LOS: 1 day   Susan Sosa  [facility]   Pager on [email or web address]. If 7PM-7AM, please contact night-coverage at  [email or web address]     11/09/2022, 1:53 PM

## 2022-11-09 NOTE — Evaluation (Signed)
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   Clinical Impression   Upon entering the room, pt supine in bed with husband in room and agreeable to OT intervention.Pt lives at home with husband and is Ind at baseline without use of AD for self care and her and husband complete IADL tasks together. Pt is on 2Ls O2 chronically at home. OT did discuss the important of pt wearing O2 in shower for safety and education for energy conservation at home. Pt active participant in discussion. Pt performs bed mobility without assistance and ambulates with RW with supervision in room and hallway. Pt reporting she feels" at least 75% myself". Pt does not need skilled acute OT intervention at this time. All education completed. OT to complete orders.     If plan is discharge home, recommend the following: Assistance with cooking/housework;Assist for transportation       Equipment Recommendations  None recommended by OT       Precautions / Restrictions Precautions Precautions: None Restrictions Weight Bearing Restrictions: No      Mobility Bed Mobility Overal bed mobility: Modified Independent                  Transfers Overall transfer level: Needs assistance Equipment used: Rolling walker (2 wheels) Transfers: Sit to/from Stand Sit to Stand: Supervision                  Balance Overall balance assessment: Needs assistance Sitting-balance support: No upper extremity supported, Feet supported Sitting balance-Leahy Scale: Normal     Standing balance support: Bilateral upper extremity supported Standing balance-Leahy Scale: Good                             ADL either performed or assessed with clinical judgement   ADL                                          General ADL Comments: supervision - mod I with use of RW     Vision Patient Visual Report: No change from baseline              Pertinent Vitals/Pain Pain Assessment Pain Assessment: No/denies pain     Extremity/Trunk Assessment Upper Extremity Assessment Upper Extremity Assessment: Overall WFL for tasks assessed   Lower Extremity Assessment Lower Extremity Assessment: Overall WFL for tasks assessed       Communication     Cognition Arousal: Alert Behavior During Therapy: WFL for tasks assessed/performed Overall Cognitive Status: Within Functional Limits for tasks assessed                                                  Home Living Family/patient expects to be discharged to:: Private residence Living Arrangements: Spouse/significant other Available Help at Discharge: Family;Available 24 hours/day Type of Home: House Home Access: Stairs to enter [employer] of Steps: 4 Entrance Stairs-Rails: Left Home Layout: One level     Bathroom Shower/Tub: Producer, television/film/video: Handicapped height  Home Equipment: Grab bars - tub/shower;Grab bars - toilet;Shower seat - built in          Prior Functioning/Environment Prior Level of Function : Independent/Modified Independent;Driving             Mobility Comments: IND for household and community distances without an AD, walks in the mall almost everyday, uses treadmill (has done less recently since helping husband recover from lobectomy/current chemo) ADLs Comments: IND for ADls/IADLs, still driving                 OT Goals(Current goals can be found in the care plan section) Acute Rehab OT Goals Patient Stated Goal: to go home OT Goal Formulation: With patient/family Time For Goal Achievement: 11/09/22 Potential to Achieve Goals: Fair  OT Frequency:         AM-PAC OT "6 Clicks" Daily Activity     Outcome Measure  Help from another person eating meals?: None Help from another person taking care of personal grooming?: None Help from another person toileting, which includes using toliet, bedpan, or urinal?: None Help from another person bathing (including washing, rinsing, drying)?: A Little Help from another person to put on and taking off regular upper body clothing?: None Help from another person to put on and taking off regular lower body clothing?: A Little 6 Click Score: 22   End of Session Equipment Utilized During Treatment: Rolling walker (2 wheels) Nurse Communication: Mobility status  Activity Tolerance: Patient tolerated treatment well Patient left: in bed                   Time: 6578-4696 OT Time Calculation (min): 16 min Charges:  OT General Charges $OT Visit: 1 Visit OT Evaluation $OT Eval Low Complexity: 1 Low OT Treatments $Therapeutic Activity: 8-22 mins  Jackquline Denmark, MS, OTR/L , CBIS ascom [phone]  11/09/22, 12:46 PM

## 2022-11-09 NOTE — ED Notes (Signed)
Ayiku, MD, made aware of soft BP 94/56 (67)

## 2022-11-09 NOTE — Consult Note (Signed)
[facility] CLINIC CARDIOLOGY CONSULT NOTE       Patient ID: Susan Sosa MRN: [id number] DOB/AGE: 1940/07/18 82 y.o.  Admit date: 11/08/2022 Referring Physician Dr. Irena Cords Primary Physician Dr. Burnadette Pop Primary Cardiologist Dr. Darrold Junker Reason for Consultation shortness of breath  HPI: Susan Sosa is a 82 y.o. female  with a past medical history of NICM, idiopathic pulmonary fibrosis, paroxysmal atrial tachycardia, hypertension, hyperlipidemia who presented to the ED on 11/08/2022 for shortness of breath. Cardiology was consulted for further evaluation.   Patient reports that last Thursday she began experiencing some dyspnea on exertion.  States that while walking around her house she would become short of breath and her oxygen saturation would decrease.  Because of this she increased her home supplemental O2 to 4 L.  She continued to have shortness of breath but was maintaining her saturations until yesterday when her O2 saturations dropped again requiring her to increase her supplemental O2 at home.  Because of her worsening respiratory status she decided to come to the ED for further evaluation.  Workup in the ED notable for Cr 0.81, K 4.5, sodium 127, hemoglobin 14.8, WBC 11.0.  BNP mildly elevated at 158.  Troponins negative at 5 > 7.  EKG demonstrated normal sinus rhythm.  Chest x-ray without evidence of acute pulmonary edema or vascular congestion.  PA chest with no evidence of acute PE but did report findings of pulmonary hypertension.  Was started on IV steroids and nebulizer treatments in the ED.  At the time of my evaluation this morning patient is resting comfortably in hospital bed with husband present at bedside.  Discussed her recent symptoms of worsening shortness of breath.  She denies any recent problems with chest pain or palpitations.  She was hospitalized in November of last year for NSTEMI, LHC with normal coronaries and reduced EF suggestive of Takotsubo cardiomyopathy.   Repeat echo done 03/2022 demonstrated recovered EF >55%.  Denies any recent issues with lower extremity edema.  Review of systems complete and found to be negative unless listed above    Past Medical History:  Diagnosis Date   Arthritis    Heart murmur    Hyperlipidemia    Hypertension    Hyponatremia    IPF (idiopathic pulmonary fibrosis) (HCC)     Past Surgical History:  Procedure Laterality Date   LEFT HEART CATH AND CORONARY ANGIOGRAPHY N/A 11/27/2021   Procedure: LEFT HEART CATH AND CORONARY ANGIOGRAPHY and possible pci and stent;  Surgeon: Alwyn Pea, MD;  Location: [facility] INVASIVE CV LAB;  Service: Cardiovascular;  Laterality: N/A;    (Not in a hospital admission)  Social History   Socioeconomic History   Marital status: Married    Spouse name: Not on file   Number of children: Not on file   Years of education: Not on file   Highest education level: Not on file  Occupational History   Not on file  Tobacco Use   Smoking status: Former   Smokeless tobacco: Never  Substance and Sexual Activity   Alcohol use: Never   Drug use: Never   Sexual activity: Not on file  Other Topics Concern   Not on file  Social History Narrative   ** Merged History Encounter **       Social Determinants of Health   Financial Resource Strain: Low Risk  (09/23/2022)   Received from [facility]   Overall Financial Resource Strain (CARDIA)    Difficulty of Paying Living Expenses: Not  hard at all  Food Insecurity: No Food Insecurity (11/09/2022)   Hunger Vital Sign    Worried About Running Out of Food in the Last Year: Never true    Ran Out of Food in the Last Year: Never true  Transportation Needs: No Transportation Needs (11/09/2022)   PRAPARE - Administrator, Civil Service (Medical): No    Lack of Transportation (Non-Medical): No  Physical Activity: Not on file  Stress: Not on file  Social Connections: Not on file  Intimate Partner Violence:  Not At Risk (11/09/2022)   Humiliation, Afraid, Rape, and Kick questionnaire    Fear of Current or Ex-Partner: No    Emotionally Abused: No    Physically Abused: No    Sexually Abused: No    History reviewed. No pertinent family history.   Vitals:   11/09/22 1030 11/09/22 1123 11/09/22 1130 11/09/22 1253  BP: 135/62 (!) 126/59 107/65 (!) 94/56  Pulse: 87 89 78 74  Resp: 18 20 (!) 21 20  Temp:  97.6 F (36.4 C)    TempSrc:  Oral    SpO2: 95% 98% 97% 95%  Weight:      Height:        PHYSICAL EXAM General: Chronically ill appearing elderly female, well nourished, in no acute distress laying at a slight incline in hospital bed with husband present at bedside. HEENT: Normocephalic and atraumatic. Neck: No JVD.  Lungs: Normal respiratory effort on 2L. Clear bilaterally to auscultation. No wheezes, crackles, rhonchi.  Heart: HRRR. Normal S1 and S2 without gallops or murmurs.  Abdomen: Non-distended appearing.  Msk: Normal strength and tone for age. Extremities: Warm and well perfused. No clubbing, cyanosis. No edema.  Neuro: Alert and oriented X 3. Psych: Answers questions appropriately.   Labs: Basic Metabolic Panel: Recent Labs    11/08/22 1659 11/08/22 2316 11/09/22 0528  NA 127*  --  131*  K 4.5  --  4.4  CL 91*  --  92*  CO2 25  --  25  GLUCOSE 108*  --  155*  BUN 19  --  21  CREATININE 0.81 0.73 0.84  CALCIUM 9.3  --  9.2   Liver Function Tests: Recent Labs    11/08/22 1830 11/09/22 0528  AST 25 25  ALT 25 26  ALKPHOS 85 82  BILITOT 0.5 0.8  PROT 8.3* 8.3*  ALBUMIN 4.5 4.3   No results for input(s): "LIPASE", "AMYLASE" in the last 72 hours. CBC: Recent Labs    11/08/22 2316 11/09/22 0528  WBC 9.6 10.3  HGB 14.3 14.4  HCT 41.6 41.8  MCV 84.2 83.9  PLT 320 339   Cardiac Enzymes: Recent Labs    11/08/22 1659 11/08/22 1830  TROPONINIHS 5 7   BNP: Recent Labs    11/08/22 1830  BNP 158.1*   D-Dimer: No results for input(s): "DDIMER" in  the last 72 hours. Hemoglobin A1C: Recent Labs    11/08/22 2316  HGBA1C 5.7*   Fasting Lipid Panel: No results for input(s): "CHOL", "HDL", "LDLCALC", "TRIG", "CHOLHDL", "LDLDIRECT" in the last 72 hours. Thyroid Function Tests: Recent Labs    11/08/22 2316  TSH 1.193   Anemia Panel: No results for input(s): "VITAMINB12", "FOLATE", "FERRITIN", "TIBC", "IRON", "RETICCTPCT" in the last 72 hours.   Radiology: CT Angio Chest PE W and/or Wo Contrast  Result Date: 11/08/2022 CLINICAL DATA:  Shortness of breath and positive D-dimer. Concern for pulmonary embolism. EXAM: CT ANGIOGRAPHY CHEST WITH CONTRAST TECHNIQUE: Multidetector  CT imaging of the chest was performed using the standard protocol during bolus administration of intravenous contrast. Multiplanar CT image reconstructions and MIPs were obtained to evaluate the vascular anatomy. RADIATION DOSE REDUCTION: This exam was performed according to the departmental dose-optimization program which includes automated exposure control, adjustment of the mA and/or kV according to patient size and/or use of iterative reconstruction technique. CONTRAST:  75mL OMNIPAQUE IOHEXOL 350 MG/ML SOLN COMPARISON:  Chest radiograph dated 11/08/2022. CT dated 11/26/2021. FINDINGS: Cardiovascular: Mild cardiomegaly. No pericardial effusion. There is moderate atherosclerotic calcification of the thoracic aorta. No aneurysmal dilatation or dissection. The origins of the great vessels of the aortic arch appear patent. There is dilatation of the main pulmonary trunk and bilateral pulmonary arteries suggestive of pulmonary hypertension. Evaluation of the pulmonary arteries is limited due to respiratory motion. Apparent tiny nonocclusive subsegmental defect in the pulmonary artery branch of lingula (192/6) may be artifactual and related to volume averaging or represent an old PE/scarring. No definite acute pulmonary artery embolus identified. Mediastinum/Nodes: No hilar or  mediastinal adenopathy. The esophagus is grossly unremarkable. No mediastinal fluid collection. Lungs/Pleura: Background of emphysema and diffuse chronic interstitial coarsening. Bilateral mosaic attenuation of the lungs likely atelectasis. No focal consolidation, pleural effusion, or pneumothorax. Right upper lobe subpleural cystic changes. The central airways are patent. Upper Abdomen: Partially visualized bilateral renal cysts. Musculoskeletal: No acute osseous pathology. Review of the MIP images confirms the above findings. IMPRESSION: 1. No acute pulmonary artery embolus. 2. Mild cardiomegaly and findings of pulmonary hypertension. 3. Aortic Atherosclerosis (ICD10-I70.0) and Emphysema (ICD10-J43.9). Electronically Signed   By: Elgie Collard M.D.   On: 11/08/2022 22:48   DG Chest 2 View  Result Date: 11/08/2022 CLINICAL DATA:  Shortness of breath EXAM: CHEST - 2 VIEW COMPARISON:  Chest x-ray 05/24/2022 FINDINGS: There is minimal bibasilar atelectasis. There is no pleural effusion or pneumothorax. The cardiomediastinal silhouette is stable, the heart is mildly enlarged. No acute fractures are seen. IMPRESSION: Minimal bibasilar atelectasis. Electronically Signed   By: Darliss Cheney M.D.   On: 11/08/2022 20:11    ECHO pending  TELEMETRY reviewed by me 11/09/2022: sinus rhythm rate 80s  EKG reviewed by me: NSR rate 76 bpm, nonacute  Data reviewed by me 11/09/2022: last 24h vitals tele labs imaging I/O ED provider note, admission H&P  Principal Problem:   Idiopathic pulmonary fibrosis (HCC) Active Problems:   Benign essential hypertension   Paroxysmal atrial tachycardia (HCC)   Chronic hyponatremia    ASSESSMENT AND PLAN:  Susan Sosa is a 82 y.o. female  with a past medical history of NICM, idiopathic pulmonary fibrosis, paroxysmal atrial tachycardia, hypertension, hyperlipidemia who presented to the ED on 11/08/2022 for shortness of breath. Cardiology was consulted for further  evaluation.   # ?Pulmonary hypertension # Idiopathic pulmonary fibrosis # Acute on chronic respiratory failure Patient presenting with worsening SOB/DOE and increasing O2 requirement (baseline 2L). Known hx of IPF, CT chest this admission with evidence of pulmonary hypertension.  -Echo ordered for evaluation of LV/RV function.  -Management of IPF exacerbation per primary.  # Hx NSTEMI # Hx HFrEF # Hypertension Patient with NSTEMI 11/2021, LHC at that time with normal coronaries, suspected Takotsubo cardiomyopathy. Initially hypertensive on presentation now BP soft.  -Continue atorvastatin 40 mg daily.  -Home losartan as BP improves, hold parameters in place. -Restart home lasix 20 mg tomorrow.   # Paroxysmal atrial tachycardia -Continue verapamil 180 mg daily.    This patient's plan of care was discussed and created  with Dr. Melton Alar and she is in agreement.  Signed: Gale Journey, PA-C  11/09/2022, 2:26 PM [facility]

## 2022-11-10 ENCOUNTER — Inpatient Hospital Stay
Admit: 2022-11-10 | Discharge: 2022-11-10 | Disposition: A | Discharge status: Home / Self Care | Payer: Medicare Other | Attending: Student | Admitting: Student

## 2022-11-10 DIAGNOSIS — I4719 Other supraventricular tachycardia: Secondary | ICD-10-CM

## 2022-11-10 DIAGNOSIS — J9621 Acute and chronic respiratory failure with hypoxia: Secondary | ICD-10-CM

## 2022-11-10 DIAGNOSIS — I10 Essential (primary) hypertension: Secondary | ICD-10-CM | POA: Diagnosis not present

## 2022-11-10 DIAGNOSIS — E871 Hypo-osmolality and hyponatremia: Secondary | ICD-10-CM

## 2022-11-10 DIAGNOSIS — I272 Pulmonary hypertension, unspecified: Secondary | ICD-10-CM

## 2022-11-10 DIAGNOSIS — J84112 Idiopathic pulmonary fibrosis: Secondary | ICD-10-CM | POA: Diagnosis not present

## 2022-11-10 LAB — ECHOCARDIOGRAM COMPLETE
AR max vel: 1.55 cm2
AV Area VTI: 1.62 cm2
AV Area mean vel: 1.62 cm2
AV Mean grad: 5 mm[Hg]
AV Peak grad: 10.1 mm[Hg]
Ao pk vel: 1.59 m/s
Area-P 1/2: 2.64 cm2
Height: 59 in
MV VTI: 1.8 cm2
S' Lateral: 1.8 cm
Weight: 2352 [oz_av]

## 2022-11-10 LAB — BASIC METABOLIC PANEL
Anion gap: 11 (ref 5–15)
BUN: 32 mg/dL — ABNORMAL HIGH (ref 8–23)
CO2: 24 mmol/L (ref 22–32)
Calcium: 8.5 mg/dL — ABNORMAL LOW (ref 8.9–10.3)
Chloride: 91 mmol/L — ABNORMAL LOW (ref 98–111)
Creatinine, Ser: 0.87 mg/dL (ref 0.44–1.00)
GFR, Estimated: >60 mL/min (ref >60)
Glucose, Bld: 128 mg/dL — ABNORMAL HIGH (ref 70–99)
Potassium: 3.8 mmol/L (ref 3.5–5.1)
Sodium: 126 mmol/L — ABNORMAL LOW (ref 135–145)

## 2022-11-10 MED ORDER — LOSARTAN POTASSIUM 50 MG PO TABS: 100.0000 mg | ORAL_TABLET | Freq: Every day | ORAL | Status: DC

## 2022-11-10 MED ORDER — PREDNISONE 10 MG PO TABS
ORAL_TABLET | ORAL | 0 refills | Status: AC
Start: 2022-11-10 — End: 2022-11-22

## 2022-11-10 NOTE — Progress Notes (Signed)
strength and tone for age. Extremities: Warm and well perfused. No clubbing, cyanosis. No edema.  Neuro: Alert and oriented X 3. Psych: Answers questions appropriately.   Labs: Basic Metabolic Panel: Recent Labs    11/09/22 0528 11/10/22 0519  NA 131* 126*  K 4.4 3.8  CL 92* 91*  CO2 25 24  GLUCOSE 155* 128*  BUN 21 32*  CREATININE 0.84 0.87  CALCIUM 9.2 8.5*   Liver Function Tests: Recent Labs    11/08/22 1830 11/09/22 0528  AST 25 25  ALT 25 26  ALKPHOS 85 82  BILITOT 0.5 0.8  PROT 8.3* 8.3*  ALBUMIN 4.5 4.3   No results for input(s): "LIPASE", "AMYLASE" in the last 72 hours. CBC: Recent Labs    11/08/22 2316 11/09/22 0528  WBC 9.6 10.3  HGB 14.3 14.4  HCT 41.6 41.8  MCV 84.2 83.9  PLT 320 339   Cardiac Enzymes: Recent Labs    11/08/22 1659 11/08/22 1830  TROPONINIHS 5 7   BNP: Recent Labs    11/08/22 1830  BNP 158.1*   D-Dimer: No results for input(s): "DDIMER" in the last 72 hours. Hemoglobin A1C: Recent Labs    11/08/22 2316  HGBA1C 5.7*   Fasting Lipid Panel: No results for input(s): "CHOL", "HDL", "LDLCALC", "TRIG", "CHOLHDL", "LDLDIRECT" in the last 72 hours. Thyroid Function Tests: Recent Labs    11/08/22 2316  TSH 1.193   Anemia Panel: No results for input(s): "VITAMINB12", "FOLATE", "FERRITIN", "TIBC", "IRON", "RETICCTPCT" in the last 72 hours.   Radiology: ECHOCARDIOGRAM COMPLETE  Result Date:  11/10/2022    ECHOCARDIOGRAM REPORT   Patient Name:   Susan Sosa Date of Exam: 11/10/2022 Medical Rec #:  [id number]    Height:       59.0 in Accession #:    [id number]   Weight:       147.0 lb Date of Birth:  [birth year]     BSA:          1.618 m Patient Age:    82 years     BP:           167/92 mmHg Patient Gender: F            HR:           71 bpm. Exam Location:  [facility] Procedure: 2D Echo, Cardiac Doppler and Color Doppler Indications:     Pulmonary HTN  History:         Patient has no prior history of Echocardiogram examinations.                  Cardiomyopathy, Acute MI, Pulmonary HTN,                  Arrythmias:Tachycardia, Signs/Symptoms:Dyspnea; Risk                  Factors:Hypertension.  Sonographer:     Mikki Harbor Referring Phys:  [id number] Lynessa Almanzar Diagnosing Phys: Rozell Searing Custovic IMPRESSIONS  1. Left ventricular ejection fraction, by estimation, is 60 to 65%. Left ventricular ejection fraction by PLAX is 84 %. The left ventricle has normal function. The left ventricle has no regional wall motion abnormalities. Left ventricular diastolic parameters are consistent with Grade I diastolic dysfunction (impaired relaxation).  2. Right ventricular systolic function is low normal. The right ventricular size is moderately enlarged. Tricuspid regurgitation signal is inadequate for assessing PA pressure.  3. The mitral valve is grossly normal. Mild mitral valve regurgitation.  4. The aortic  [facility] CLINIC CARDIOLOGY CONSULT NOTE       Patient ID: Susan Sosa MRN: [id number] DOB/AGE: 04-Nov-1940 82 y.o.  Admit date: 11/08/2022 Referring Physician Dr. Irena Cords Primary Physician Dr. Burnadette Pop Primary Cardiologist Dr. Darrold Junker Reason for Consultation shortness of breath  HPI: Susan Sosa is a 82 y.o. female  with a past medical history of NICM, idiopathic pulmonary fibrosis, paroxysmal atrial tachycardia, hypertension, hyperlipidemia who presented to the ED on 11/08/2022 for shortness of breath. Cardiology was consulted for further evaluation.   Interval history: -Patient reports feeling much better today. States SOB is significantly improved.  -Denies any CP, palpitations, dizziness, syncope.  -BP elevated. HR stable.   Review of systems complete and found to be negative unless listed above    Past Medical History:  Diagnosis Date   Arthritis    Heart murmur    Hyperlipidemia    Hypertension    Hyponatremia    IPF (idiopathic pulmonary fibrosis) (HCC)     Past Surgical History:  Procedure Laterality Date   LEFT HEART CATH AND CORONARY ANGIOGRAPHY N/A 11/27/2021   Procedure: LEFT HEART CATH AND CORONARY ANGIOGRAPHY and possible pci and stent;  Surgeon: Alwyn Pea, MD;  Location: [facility] INVASIVE CV LAB;  Service: Cardiovascular;  Laterality: N/A;    Medications Prior to Admission  Medication Sig Dispense Refill Last Dose   albuterol (PROVENTIL) (2.5 MG/3ML) 0.083% nebulizer solution Take 2.5 mg by nebulization 4 (four) times daily.   11/08/2022   ascorbic acid (VITAMIN C) 500 MG tablet ascorbic acid (vitamin C) 500 mg tablet   1  by oral route.   11/08/2022 at 0900   aspirin 81 MG chewable tablet Chew 81 mg by mouth every evening.   11/08/2022 at 0900   atorvastatin (LIPITOR) 40 MG tablet Take 1 tablet by mouth daily.   11/08/2022   Calcium Polycarbophil (FIBER-CAPS PO) Take 1 tablet by mouth daily.   11/07/2022 at 2000   cloNIDine (CATAPRES) 0.2 MG  tablet Take 0.2 mg by mouth 2 (two) times daily.   11/08/2022 at 0900   cyanocobalamin 1000 MCG tablet Take 1,000 mcg by mouth daily.   11/08/2022   esomeprazole (NEXIUM) 40 MG capsule Take 1 capsule by mouth at bedtime.   11/07/2022 at 2000   furosemide (LASIX) 20 MG tablet Take 1 tablet by mouth daily.   11/08/2022   glucosamine-chondroitin 500-400 MG tablet Take 1 tablet by mouth daily.   11/07/2022 at 2000   losartan (COZAAR) 50 MG tablet Take 50 mg by mouth daily.   11/08/2022   methylPREDNISolone (MEDROL DOSEPAK) 4 MG TBPK tablet Take 4-32 mg by mouth See admin instructions.   11/08/2022   Multiple Vitamins-Minerals (HAIR SKIN AND NAILS FORMULA PO) Take 1 tablet by mouth daily.   11/08/2022   Multiple Vitamins-Minerals (PRESERVISION/LUTEIN) CAPS Take 1 capsule by mouth daily.   11/08/2022   nitroGLYCERIN (NITROSTAT) 0.4 MG SL tablet Place 1 tablet (0.4 mg total) under the tongue every 5 (five) minutes as needed for chest pain. 20 tablet 12 prn   Omega-3 Fatty Acids (FISH OIL) 1200 MG CAPS Take 1 capsule by mouth daily.   11/07/2022 at 2000   ondansetron (ZOFRAN) 8 MG tablet TAKE 1 TABLET BY MOUTH EVERY 12 HOURS AS NEEDED FOR NAUSEA.   prn   Pirfenidone (ESBRIET) 267 MG CAPS Take 534 mg by mouth with breakfast, with lunch, and with evening meal.   11/08/2022 at 1300   polyethylene glycol powder (GLYCOLAX/MIRALAX) 17 GM/SCOOP powder 1 capful  [facility] CLINIC CARDIOLOGY CONSULT NOTE       Patient ID: Susan Sosa MRN: [id number] DOB/AGE: 04-Nov-1940 82 y.o.  Admit date: 11/08/2022 Referring Physician Dr. Irena Cords Primary Physician Dr. Burnadette Pop Primary Cardiologist Dr. Darrold Junker Reason for Consultation shortness of breath  HPI: Susan Sosa is a 82 y.o. female  with a past medical history of NICM, idiopathic pulmonary fibrosis, paroxysmal atrial tachycardia, hypertension, hyperlipidemia who presented to the ED on 11/08/2022 for shortness of breath. Cardiology was consulted for further evaluation.   Interval history: -Patient reports feeling much better today. States SOB is significantly improved.  -Denies any CP, palpitations, dizziness, syncope.  -BP elevated. HR stable.   Review of systems complete and found to be negative unless listed above    Past Medical History:  Diagnosis Date   Arthritis    Heart murmur    Hyperlipidemia    Hypertension    Hyponatremia    IPF (idiopathic pulmonary fibrosis) (HCC)     Past Surgical History:  Procedure Laterality Date   LEFT HEART CATH AND CORONARY ANGIOGRAPHY N/A 11/27/2021   Procedure: LEFT HEART CATH AND CORONARY ANGIOGRAPHY and possible pci and stent;  Surgeon: Alwyn Pea, MD;  Location: [facility] INVASIVE CV LAB;  Service: Cardiovascular;  Laterality: N/A;    Medications Prior to Admission  Medication Sig Dispense Refill Last Dose   albuterol (PROVENTIL) (2.5 MG/3ML) 0.083% nebulizer solution Take 2.5 mg by nebulization 4 (four) times daily.   11/08/2022   ascorbic acid (VITAMIN C) 500 MG tablet ascorbic acid (vitamin C) 500 mg tablet   1  by oral route.   11/08/2022 at 0900   aspirin 81 MG chewable tablet Chew 81 mg by mouth every evening.   11/08/2022 at 0900   atorvastatin (LIPITOR) 40 MG tablet Take 1 tablet by mouth daily.   11/08/2022   Calcium Polycarbophil (FIBER-CAPS PO) Take 1 tablet by mouth daily.   11/07/2022 at 2000   cloNIDine (CATAPRES) 0.2 MG  tablet Take 0.2 mg by mouth 2 (two) times daily.   11/08/2022 at 0900   cyanocobalamin 1000 MCG tablet Take 1,000 mcg by mouth daily.   11/08/2022   esomeprazole (NEXIUM) 40 MG capsule Take 1 capsule by mouth at bedtime.   11/07/2022 at 2000   furosemide (LASIX) 20 MG tablet Take 1 tablet by mouth daily.   11/08/2022   glucosamine-chondroitin 500-400 MG tablet Take 1 tablet by mouth daily.   11/07/2022 at 2000   losartan (COZAAR) 50 MG tablet Take 50 mg by mouth daily.   11/08/2022   methylPREDNISolone (MEDROL DOSEPAK) 4 MG TBPK tablet Take 4-32 mg by mouth See admin instructions.   11/08/2022   Multiple Vitamins-Minerals (HAIR SKIN AND NAILS FORMULA PO) Take 1 tablet by mouth daily.   11/08/2022   Multiple Vitamins-Minerals (PRESERVISION/LUTEIN) CAPS Take 1 capsule by mouth daily.   11/08/2022   nitroGLYCERIN (NITROSTAT) 0.4 MG SL tablet Place 1 tablet (0.4 mg total) under the tongue every 5 (five) minutes as needed for chest pain. 20 tablet 12 prn   Omega-3 Fatty Acids (FISH OIL) 1200 MG CAPS Take 1 capsule by mouth daily.   11/07/2022 at 2000   ondansetron (ZOFRAN) 8 MG tablet TAKE 1 TABLET BY MOUTH EVERY 12 HOURS AS NEEDED FOR NAUSEA.   prn   Pirfenidone (ESBRIET) 267 MG CAPS Take 534 mg by mouth with breakfast, with lunch, and with evening meal.   11/08/2022 at 1300   polyethylene glycol powder (GLYCOLAX/MIRALAX) 17 GM/SCOOP powder 1 capful  strength and tone for age. Extremities: Warm and well perfused. No clubbing, cyanosis. No edema.  Neuro: Alert and oriented X 3. Psych: Answers questions appropriately.   Labs: Basic Metabolic Panel: Recent Labs    11/09/22 0528 11/10/22 0519  NA 131* 126*  K 4.4 3.8  CL 92* 91*  CO2 25 24  GLUCOSE 155* 128*  BUN 21 32*  CREATININE 0.84 0.87  CALCIUM 9.2 8.5*   Liver Function Tests: Recent Labs    11/08/22 1830 11/09/22 0528  AST 25 25  ALT 25 26  ALKPHOS 85 82  BILITOT 0.5 0.8  PROT 8.3* 8.3*  ALBUMIN 4.5 4.3   No results for input(s): "LIPASE", "AMYLASE" in the last 72 hours. CBC: Recent Labs    11/08/22 2316 11/09/22 0528  WBC 9.6 10.3  HGB 14.3 14.4  HCT 41.6 41.8  MCV 84.2 83.9  PLT 320 339   Cardiac Enzymes: Recent Labs    11/08/22 1659 11/08/22 1830  TROPONINIHS 5 7   BNP: Recent Labs    11/08/22 1830  BNP 158.1*   D-Dimer: No results for input(s): "DDIMER" in the last 72 hours. Hemoglobin A1C: Recent Labs    11/08/22 2316  HGBA1C 5.7*   Fasting Lipid Panel: No results for input(s): "CHOL", "HDL", "LDLCALC", "TRIG", "CHOLHDL", "LDLDIRECT" in the last 72 hours. Thyroid Function Tests: Recent Labs    11/08/22 2316  TSH 1.193   Anemia Panel: No results for input(s): "VITAMINB12", "FOLATE", "FERRITIN", "TIBC", "IRON", "RETICCTPCT" in the last 72 hours.   Radiology: ECHOCARDIOGRAM COMPLETE  Result Date:  11/10/2022    ECHOCARDIOGRAM REPORT   Patient Name:   Susan Sosa Date of Exam: 11/10/2022 Medical Rec #:  [id number]    Height:       59.0 in Accession #:    [id number]   Weight:       147.0 lb Date of Birth:  [birth year]     BSA:          1.618 m Patient Age:    82 years     BP:           167/92 mmHg Patient Gender: F            HR:           71 bpm. Exam Location:  [facility] Procedure: 2D Echo, Cardiac Doppler and Color Doppler Indications:     Pulmonary HTN  History:         Patient has no prior history of Echocardiogram examinations.                  Cardiomyopathy, Acute MI, Pulmonary HTN,                  Arrythmias:Tachycardia, Signs/Symptoms:Dyspnea; Risk                  Factors:Hypertension.  Sonographer:     Mikki Harbor Referring Phys:  [id number] Lynessa Almanzar Diagnosing Phys: Rozell Searing Custovic IMPRESSIONS  1. Left ventricular ejection fraction, by estimation, is 60 to 65%. Left ventricular ejection fraction by PLAX is 84 %. The left ventricle has normal function. The left ventricle has no regional wall motion abnormalities. Left ventricular diastolic parameters are consistent with Grade I diastolic dysfunction (impaired relaxation).  2. Right ventricular systolic function is low normal. The right ventricular size is moderately enlarged. Tricuspid regurgitation signal is inadequate for assessing PA pressure.  3. The mitral valve is grossly normal. Mild mitral valve regurgitation.  4. The aortic  strength and tone for age. Extremities: Warm and well perfused. No clubbing, cyanosis. No edema.  Neuro: Alert and oriented X 3. Psych: Answers questions appropriately.   Labs: Basic Metabolic Panel: Recent Labs    11/09/22 0528 11/10/22 0519  NA 131* 126*  K 4.4 3.8  CL 92* 91*  CO2 25 24  GLUCOSE 155* 128*  BUN 21 32*  CREATININE 0.84 0.87  CALCIUM 9.2 8.5*   Liver Function Tests: Recent Labs    11/08/22 1830 11/09/22 0528  AST 25 25  ALT 25 26  ALKPHOS 85 82  BILITOT 0.5 0.8  PROT 8.3* 8.3*  ALBUMIN 4.5 4.3   No results for input(s): "LIPASE", "AMYLASE" in the last 72 hours. CBC: Recent Labs    11/08/22 2316 11/09/22 0528  WBC 9.6 10.3  HGB 14.3 14.4  HCT 41.6 41.8  MCV 84.2 83.9  PLT 320 339   Cardiac Enzymes: Recent Labs    11/08/22 1659 11/08/22 1830  TROPONINIHS 5 7   BNP: Recent Labs    11/08/22 1830  BNP 158.1*   D-Dimer: No results for input(s): "DDIMER" in the last 72 hours. Hemoglobin A1C: Recent Labs    11/08/22 2316  HGBA1C 5.7*   Fasting Lipid Panel: No results for input(s): "CHOL", "HDL", "LDLCALC", "TRIG", "CHOLHDL", "LDLDIRECT" in the last 72 hours. Thyroid Function Tests: Recent Labs    11/08/22 2316  TSH 1.193   Anemia Panel: No results for input(s): "VITAMINB12", "FOLATE", "FERRITIN", "TIBC", "IRON", "RETICCTPCT" in the last 72 hours.   Radiology: ECHOCARDIOGRAM COMPLETE  Result Date:  11/10/2022    ECHOCARDIOGRAM REPORT   Patient Name:   Susan Sosa Date of Exam: 11/10/2022 Medical Rec #:  [id number]    Height:       59.0 in Accession #:    [id number]   Weight:       147.0 lb Date of Birth:  [birth year]     BSA:          1.618 m Patient Age:    82 years     BP:           167/92 mmHg Patient Gender: F            HR:           71 bpm. Exam Location:  [facility] Procedure: 2D Echo, Cardiac Doppler and Color Doppler Indications:     Pulmonary HTN  History:         Patient has no prior history of Echocardiogram examinations.                  Cardiomyopathy, Acute MI, Pulmonary HTN,                  Arrythmias:Tachycardia, Signs/Symptoms:Dyspnea; Risk                  Factors:Hypertension.  Sonographer:     Mikki Harbor Referring Phys:  [id number] Lynessa Almanzar Diagnosing Phys: Rozell Searing Custovic IMPRESSIONS  1. Left ventricular ejection fraction, by estimation, is 60 to 65%. Left ventricular ejection fraction by PLAX is 84 %. The left ventricle has normal function. The left ventricle has no regional wall motion abnormalities. Left ventricular diastolic parameters are consistent with Grade I diastolic dysfunction (impaired relaxation).  2. Right ventricular systolic function is low normal. The right ventricular size is moderately enlarged. Tricuspid regurgitation signal is inadequate for assessing PA pressure.  3. The mitral valve is grossly normal. Mild mitral valve regurgitation.  4. The aortic  [facility] CLINIC CARDIOLOGY CONSULT NOTE       Patient ID: Susan Sosa MRN: [id number] DOB/AGE: 04-Nov-1940 82 y.o.  Admit date: 11/08/2022 Referring Physician Dr. Irena Cords Primary Physician Dr. Burnadette Pop Primary Cardiologist Dr. Darrold Junker Reason for Consultation shortness of breath  HPI: Susan Sosa is a 82 y.o. female  with a past medical history of NICM, idiopathic pulmonary fibrosis, paroxysmal atrial tachycardia, hypertension, hyperlipidemia who presented to the ED on 11/08/2022 for shortness of breath. Cardiology was consulted for further evaluation.   Interval history: -Patient reports feeling much better today. States SOB is significantly improved.  -Denies any CP, palpitations, dizziness, syncope.  -BP elevated. HR stable.   Review of systems complete and found to be negative unless listed above    Past Medical History:  Diagnosis Date   Arthritis    Heart murmur    Hyperlipidemia    Hypertension    Hyponatremia    IPF (idiopathic pulmonary fibrosis) (HCC)     Past Surgical History:  Procedure Laterality Date   LEFT HEART CATH AND CORONARY ANGIOGRAPHY N/A 11/27/2021   Procedure: LEFT HEART CATH AND CORONARY ANGIOGRAPHY and possible pci and stent;  Surgeon: Alwyn Pea, MD;  Location: [facility] INVASIVE CV LAB;  Service: Cardiovascular;  Laterality: N/A;    Medications Prior to Admission  Medication Sig Dispense Refill Last Dose   albuterol (PROVENTIL) (2.5 MG/3ML) 0.083% nebulizer solution Take 2.5 mg by nebulization 4 (four) times daily.   11/08/2022   ascorbic acid (VITAMIN C) 500 MG tablet ascorbic acid (vitamin C) 500 mg tablet   1  by oral route.   11/08/2022 at 0900   aspirin 81 MG chewable tablet Chew 81 mg by mouth every evening.   11/08/2022 at 0900   atorvastatin (LIPITOR) 40 MG tablet Take 1 tablet by mouth daily.   11/08/2022   Calcium Polycarbophil (FIBER-CAPS PO) Take 1 tablet by mouth daily.   11/07/2022 at 2000   cloNIDine (CATAPRES) 0.2 MG  tablet Take 0.2 mg by mouth 2 (two) times daily.   11/08/2022 at 0900   cyanocobalamin 1000 MCG tablet Take 1,000 mcg by mouth daily.   11/08/2022   esomeprazole (NEXIUM) 40 MG capsule Take 1 capsule by mouth at bedtime.   11/07/2022 at 2000   furosemide (LASIX) 20 MG tablet Take 1 tablet by mouth daily.   11/08/2022   glucosamine-chondroitin 500-400 MG tablet Take 1 tablet by mouth daily.   11/07/2022 at 2000   losartan (COZAAR) 50 MG tablet Take 50 mg by mouth daily.   11/08/2022   methylPREDNISolone (MEDROL DOSEPAK) 4 MG TBPK tablet Take 4-32 mg by mouth See admin instructions.   11/08/2022   Multiple Vitamins-Minerals (HAIR SKIN AND NAILS FORMULA PO) Take 1 tablet by mouth daily.   11/08/2022   Multiple Vitamins-Minerals (PRESERVISION/LUTEIN) CAPS Take 1 capsule by mouth daily.   11/08/2022   nitroGLYCERIN (NITROSTAT) 0.4 MG SL tablet Place 1 tablet (0.4 mg total) under the tongue every 5 (five) minutes as needed for chest pain. 20 tablet 12 prn   Omega-3 Fatty Acids (FISH OIL) 1200 MG CAPS Take 1 capsule by mouth daily.   11/07/2022 at 2000   ondansetron (ZOFRAN) 8 MG tablet TAKE 1 TABLET BY MOUTH EVERY 12 HOURS AS NEEDED FOR NAUSEA.   prn   Pirfenidone (ESBRIET) 267 MG CAPS Take 534 mg by mouth with breakfast, with lunch, and with evening meal.   11/08/2022 at 1300   polyethylene glycol powder (GLYCOLAX/MIRALAX) 17 GM/SCOOP powder 1 capful

## 2022-11-10 NOTE — Plan of Care (Signed)

## 2022-11-10 NOTE — Progress Notes (Signed)
Susan Sosa  A and O x 4. VSS. Pt tolerating diet well. No complaints of pain or nausea. IV removed intact, prescriptions given. Pt voiced understanding of discharge instructions with no further questions. Pt discharged via wheelchair with axillary.    Allergies as of 11/10/2022       Reactions   Latex Itching, Rash   Lidocaine Swelling   Facial swelling from a dentist Facial swelling from a dentist Facial swelling from a dentist   Atorvastatin    Severe hip pain   Losartan Swelling   Metronidazole Other (See Comments)   Sore joints Sore joints   Oxycodone Nausea Only        Medication List     STOP taking these medications    methylPREDNISolone 4 MG Tbpk tablet Commonly known as: MEDROL DOSEPAK       TAKE these medications    albuterol (2.5 MG/3ML) 0.083% nebulizer solution Commonly known as: PROVENTIL Take 2.5 mg by nebulization 4 (four) times daily.   ascorbic acid 500 MG tablet Commonly known as: VITAMIN C ascorbic acid (vitamin C) 500 mg tablet   1  by oral route.   aspirin 81 MG chewable tablet Chew 81 mg by mouth every evening.   atorvastatin 40 MG tablet Commonly known as: LIPITOR Take 1 tablet by mouth daily.   cloNIDine 0.2 MG tablet Commonly known as: CATAPRES Take 0.2 mg by mouth 2 (two) times daily.   cyanocobalamin 1000 MCG tablet Take 1,000 mcg by mouth daily.   Esbriet 267 MG Caps Generic drug: Pirfenidone Take 534 mg by mouth with breakfast, with lunch, and with evening meal.   esomeprazole 40 MG capsule Commonly known as: NEXIUM Take 1 capsule by mouth at bedtime.   FIBER-CAPS PO Take 1 tablet by mouth daily.   Fish Oil 1200 MG Caps Take 1 capsule by mouth daily.   furosemide 20 MG tablet Commonly known as: LASIX Take 1 tablet by mouth daily.   glucosamine-chondroitin 500-400 MG tablet Take 1 tablet by mouth daily.   losartan 50 MG tablet Commonly known as: COZAAR Take 50 mg by mouth daily.   nitroGLYCERIN 0.4 MG SL  tablet Commonly known as: NITROSTAT Place 1 tablet (0.4 mg total) under the tongue every 5 (five) minutes as needed for chest pain.   ondansetron 8 MG tablet Commonly known as: ZOFRAN TAKE 1 TABLET BY MOUTH EVERY 12 HOURS AS NEEDED FOR NAUSEA.   OXYGEN Inhale 2 L into the lungs continuous.   polyethylene glycol powder 17 GM/SCOOP powder Commonly known as: GLYCOLAX/MIRALAX 1 capful once a day , may use 2 per day if continued constipation   prednisoLONE acetate 1 % ophthalmic suspension Commonly known as: PRED FORTE prednisolone acetate 1 % eye drops,suspension  INSTILL 1 DROP INTO LEFT EYE 3 TIMES A DAY   predniSONE 10 MG tablet Commonly known as: DELTASONE Take 6 tablets (60 mg total) by mouth daily for 2 days, THEN 5 tablets (50 mg total) daily for 2 days, THEN 4 tablets (40 mg total) daily for 2 days, THEN 3 tablets (30 mg total) daily for 2 days, THEN 2 tablets (20 mg total) daily for 2 days, THEN 1 tablet (10 mg total) daily for 2 days. Start taking on: November 10, 2022   PreserVision/Lutein Caps Take 1 capsule by mouth daily.   HAIR SKIN AND NAILS FORMULA PO Take 1 tablet by mouth daily.   sodium chloride 5 % ophthalmic solution Commonly known as: MURO 128 Place 1 drop  into the right eye at bedtime.   triamcinolone cream 0.1 % Commonly known as: KENALOG Apply 1 Application topically 2 (two) times daily as needed.   verapamil 180 MG CR tablet Commonly known as: CALAN-SR Take by mouth.   Vitamin D (Ergocalciferol) 1.25 MG (50000 UNIT) Caps capsule Commonly known as: DRISDOL Take 50,000 Units by mouth daily.   vitamin E 180 MG (400 UNITS) capsule vitamin E 268 mg (400 unit) capsule   400 units by oral route.        Vitals:   11/10/22 1118 11/10/22 1125  BP: 137/68   Pulse: 74 84  Resp: 20   Temp: 98.6 F (37 C)   SpO2: 94% (!) 84%    Susan Sosa

## 2022-11-10 NOTE — Progress Notes (Signed)
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 She utilized 2-3L/min [place] at home.  She also has dyslipidemia, arthritis, essential HTN.  She was seen on clinic and reported worsening dyspnea and hypoxemia.  Patient came to ER and was noted to have respiratory distress and was hospitalized.  She had CTPE done which ruled out PE and has been weaned to her baseline O2 requirement.  She is ambulatory and husband is present in room.  Patient had CT chest with findings of mosaic attenuation but no cancerous tumors, effusion, pneumonia. PCCM consultation for further evaluation and management.    PAST MEDICAL HISTORY   Past Medical History:  Diagnosis Date   Arthritis    Heart murmur    Hyperlipidemia    Hypertension    Hyponatremia    IPF (idiopathic pulmonary fibrosis) (HCC)      SURGICAL HISTORY   Past Surgical History:  Procedure Laterality Date   LEFT HEART CATH AND CORONARY ANGIOGRAPHY N/A 11/27/2021   Procedure: LEFT HEART CATH AND CORONARY ANGIOGRAPHY and possible pci and stent;  Surgeon: Alwyn Pea, MD;  Location: [facility] INVASIVE CV LAB;  Service: Cardiovascular;  Laterality: N/A;     FAMILY HISTORY   History reviewed. No pertinent family history.   SOCIAL HISTORY   Social History   Tobacco Use   Smoking status: Former   Smokeless tobacco: Never  Substance Use Topics   Alcohol use: Never   Drug use: Never     MEDICATIONS    Home Medication:  Current Outpatient Rx   Order #: [id number] Class: Normal    Current Medication:  Current Facility-Administered Medications:    acetaminophen (TYLENOL) tablet 650 mg, 650 mg, Oral,  Q6H PRN, 650 mg at 11/09/22 0836 **OR** acetaminophen (TYLENOL) suppository 650 mg, 650 mg, Rectal, Q6H PRN, Gertha Calkin, MD   albuterol (PROVENTIL) (2.5 MG/3ML) 0.083% nebulizer solution 2.5 mg, 2.5 mg, Nebulization, Q6H PRN, Gertha Calkin, MD   atorvastatin (LIPITOR) tablet 40 mg, 40 mg, Oral, Daily, Irena Cords V, MD, 40 mg at 11/10/22 0852   furosemide (LASIX) tablet 20 mg, 20 mg, Oral, Daily, Hudson, Caralyn, PA-C, 20 mg at 11/10/22 0852   heparin injection 5,000 Units, 5,000 Units, Subcutaneous, Q8H, Irena Cords V, MD, 5,000 Units at 11/10/22 0531   [START ON 11/11/2022] losartan (COZAAR) tablet 100 mg, 100 mg, Oral, Daily, Hudson, Caralyn, PA-C   methylPREDNISolone sodium succinate (SOLU-MEDROL) 125 mg/2 mL injection 80 mg, 80 mg, Intravenous, Q12H, Irena Cords V, MD, 80 mg at 11/10/22 0531   nitroGLYCERIN (NITROSTAT) SL tablet 0.4 mg, 0.4 mg, Sublingual, Q5 min PRN, Gertha Calkin, MD   ondansetron (ZOFRAN) tablet 8 mg, 8 mg, Oral, Once, Irena Cords V, MD   pantoprazole (PROTONIX) EC tablet 40 mg, 40 mg, Oral, Daily, Irena Cords V, MD, 40 mg at 11/10/22 7841   Pirfenidone CAPS 267 mg, 267 mg, Oral, TID with meals, Irena Cords V, MD   sodium chloride flush (NS) 0.9 % injection 3 mL, 3 mL,  Intravenous, Q12H, Gertha Calkin, MD, 3 mL at 11/10/22 0854   verapamil (CALAN-SR) CR tablet 180 mg, 180 mg, Oral, QHS, Gertha Calkin, MD, 180 mg at 11/09/22 2112    ALLERGIES   Latex, Lidocaine, Atorvastatin, Losartan, Metronidazole, and Oxycodone     REVIEW OF SYSTEMS    Review of Systems:  Gen:  Denies  fever, sweats, chills weigh loss  HEENT: Denies blurred vision, double vision, ear pain, eye pain, hearing loss, nose bleeds, sore throat Cardiac:  No dizziness, chest pain or heaviness, chest tightness,edema Resp:   reports dyspnea chronically  Gi: Denies swallowing difficulty, stomach pain, nausea or vomiting, diarrhea, constipation, bowel incontinence Gu:  Denies bladder incontinence,  burning urine Ext:   Denies Joint pain, stiffness or swelling Skin: Denies  skin rash, easy bruising or bleeding or hives Endoc:  Denies polyuria, polydipsia , polyphagia or weight change Psych:   Denies depression, insomnia or hallucinations   Other:  All other systems negative   VS: BP 137/68 (BP Location: Right Arm)   Pulse 84   Temp 98.6 F (37 C) (Oral)   Resp 20   Ht 4\' 11"  (1.499 m)   Wt 56.8 kg   SpO2 (!) 84%   BMI 25.31 kg/m      PHYSICAL EXAM    GENERAL:NAD, no fevers, chills, no weakness no fatigue HEAD: Normocephalic, atraumatic.  EYES: Pupils equal, round, reactive to light. Extraocular muscles intact. No scleral icterus.  MOUTH: Moist mucosal membrane. Dentition intact. No abscess noted.  EAR, NOSE, THROAT: Clear without exudates. No external lesions.  NECK: Supple. No thyromegaly. No nodules. No JVD.  PULMONARY: decreased breath sounds with mild rhonchi worse at bases bilaterally.  CARDIOVASCULAR: S1 and S2. Regular rate and rhythm. No murmurs, rubs, or gallops. No edema. Pedal pulses 2+ bilaterally.  GASTROINTESTINAL: Soft, nontender, nondistended. No masses. Positive bowel sounds. No hepatosplenomegaly.  MUSCULOSKELETAL: No swelling, clubbing, or edema. Range of motion full in all extremities.  NEUROLOGIC: Cranial nerves II through XII are intact. No gross focal neurological deficits. Sensation intact. Reflexes intact.  SKIN: No ulceration, lesions, rashes, or cyanosis. Skin warm and dry. Turgor intact.  PSYCHIATRIC: Mood, affect within normal limits. The patient is awake, alert and oriented x 3. Insight, judgment intact.       IMAGING   Reviwed CT chest done 11/09/22  ASSESSMENT/PLAN   Acute on chronic hypoxemic respiratory failure  - this seems to be mostly related to episodic accelerated hypertension with resultant pulmoary edema.  This diagnosis is supported by findings of severe HTN, and mosaic attenuation on CT chest.  Additional possibilites  include cardiac dysfunction with cardiology evaluation in process.  -patient is being treated medically at this time and already feels improvement.  She is being optimzied for dc home on medrol dose pack and have outpatient follow up with [facility] pulmonology.       Thank you for allowing me to participate in the care of this patient.   Patient/Family are satisfied with care plan and all questions have been answered.    Provider disclosure: Patient with at least one acute or chronic illness or injury that poses a threat to life or bodily function and is being managed actively during this encounter.  All of the below services have been performed independently by signing provider:  review of prior documentation from internal and or external health records.  Review of previous and current lab results.  Interview and comprehensive assessment during patient visit today. Review of  current and previous chest radiographs/CT scans. Discussion of management and test interpretation with health care team and patient/family.   This document was prepared using Dragon voice recognition software and may include unintentional dictation errors.     Vida Rigger, M.D.  Division of Pulmonary & Critical Care Medicine

## 2022-11-10 NOTE — TOC Transition Note (Signed)
Transition of Care [facility]) - CM/SW Discharge Note   Patient Details  Name: Susan Sosa MRN: [id number] Date of Birth: 1940-09-27  [facility] CM/SW Contact:  Darolyn Rua, LCSW Phone Number: 11/10/2022, 11:52 AM   Clinical Narrative:     Patient to dc today home with home health services, Spouse to provide transportation and confirmed he did bring home o2 for discharge and they are agreeable to home health without preference of an agency, CSW sent out referrals Morrie Sheldon with [facility] able to accept.   Patient has RW at home, no dme needs  No additional needs identified for discharge at this time.   Final next level of care: Home w Home Health Services Barriers to Discharge: Continued Medical Work up   Patient Goals and CMS Choice CMS Medicare.gov Compare Post Acute Care list provided to:: Patient Choice offered to / list presented to : Patient  Discharge Placement                      Patient and family notified of of transfer: 11/10/22  Discharge Plan and Services Additional resources added to the After Visit Summary for                              [facility] Agency: [facility] Date [facility] Agency Contacted: 11/10/22 Time HH Agency Contacted: 1152 Representative spoke with at [facility] Agency: Morrie Sheldon  Social Determinants of Health (SDOH) Interventions SDOH Screenings   Food Insecurity: No Food Insecurity (11/09/2022)  Housing: Low Risk  (11/09/2022)  Transportation Needs: No Transportation Needs (11/09/2022)  Utilities: Not At Risk (11/09/2022)  Depression (PHQ2-9): Medium Risk (12/23/2020)  Financial Resource Strain: Low Risk  (09/23/2022)   Received from [facility]  Tobacco Use: Medium Risk (11/09/2022)     Readmission Risk Interventions     No data to display

## 2022-11-10 NOTE — Progress Notes (Signed)
*  PRELIMINARY RESULTS* Echocardiogram 2D Echocardiogram has been performed.  Susan Sosa 11/10/2022, 10:34 AM

## 2022-11-10 NOTE — Discharge Summary (Signed)
Physician Discharge Summary   Patient: Susan Sosa MRN: [id number] DOB: 1940-03-31  Admit date:     11/08/2022  Discharge date: 11/10/22  Discharge Physician: Marcelino Duster   PCP: Marisue Ivan, MD   Recommendations at discharge:  {Tip this will not be part of the note when signed- Example include specific recommendations for outpatient follow-up, pending tests to follow-up on. (Optional):26781}  PCP follow up in 1 week Cardiology and Pulmonary follow up as scheduled  Discharge Diagnoses: Principal Problem:   Idiopathic pulmonary fibrosis (HCC) Active Problems:   Benign essential hypertension   Paroxysmal atrial tachycardia (HCC)   Chronic hyponatremia  Resolved Problems:   * No resolved hospital problems. [facility] Course: No notes on file  Assessment and Plan: Paroxysmal atrial tachycardia (HCC) Continue verapamil. CTA pending. Will defer to cardiology for anticoagulation consideration.     Benign essential hypertension Vitals:   11/08/22 1655 11/08/22 1845 11/08/22 1900  BP: (!) 200/92 (!) 186/80 (!) 179/72  Cont clonidine, Cozaar, verapamil. As needed hydralazine.  Chronic hyponatremia Attribute to patient's furosemide. Mild and chronic will monitor.  No neurological symptoms.      {Tip this will not be part of the note when signed Body mass index is 25.31 kg/m. , ,  (Optional):26781}  {(NOTE) Pain control PDMP Statment (Optional):26782} Consultants: *** Procedures performed: ***  Disposition: {Plan; Disposition:26390} Diet recommendation:  Discharge Diet Orders (From admission, onward)     Start     Ordered   11/10/22 0000  Diet - low sodium heart healthy        11/10/22 1135           {Diet_Plan:26776} DISCHARGE MEDICATION: Allergies as of 11/10/2022       Reactions   Latex Itching, Rash   Lidocaine Swelling   Facial swelling from a dentist Facial swelling from a dentist Facial swelling from a dentist   Atorvastatin     Severe hip pain   Losartan Swelling   Metronidazole Other (See Comments)   Sore joints Sore joints   Oxycodone Nausea Only        Medication List     STOP taking these medications    methylPREDNISolone 4 MG Tbpk tablet Commonly known as: MEDROL DOSEPAK       TAKE these medications    albuterol (2.5 MG/3ML) 0.083% nebulizer solution Commonly known as: PROVENTIL Take 2.5 mg by nebulization 4 (four) times daily.   ascorbic acid 500 MG tablet Commonly known as: VITAMIN C ascorbic acid (vitamin C) 500 mg tablet   1  by oral route.   aspirin 81 MG chewable tablet Chew 81 mg by mouth every evening.   atorvastatin 40 MG tablet Commonly known as: LIPITOR Take 1 tablet by mouth daily.   cloNIDine 0.2 MG tablet Commonly known as: CATAPRES Take 0.2 mg by mouth 2 (two) times daily.   cyanocobalamin 1000 MCG tablet Take 1,000 mcg by mouth daily.   Esbriet 267 MG Caps Generic drug: Pirfenidone Take 534 mg by mouth with breakfast, with lunch, and with evening meal.   esomeprazole 40 MG capsule Commonly known as: NEXIUM Take 1 capsule by mouth at bedtime.   FIBER-CAPS PO Take 1 tablet by mouth daily.   Fish Oil 1200 MG Caps Take 1 capsule by mouth daily.   furosemide 20 MG tablet Commonly known as: LASIX Take 1 tablet by mouth daily.   glucosamine-chondroitin 500-400 MG tablet Take 1 tablet by mouth daily.   losartan 50 MG tablet Commonly known  as: COZAAR Take 50 mg by mouth daily.   nitroGLYCERIN 0.4 MG SL tablet Commonly known as: NITROSTAT Place 1 tablet (0.4 mg total) under the tongue every 5 (five) minutes as needed for chest pain.   ondansetron 8 MG tablet Commonly known as: ZOFRAN TAKE 1 TABLET BY MOUTH EVERY 12 HOURS AS NEEDED FOR NAUSEA.   OXYGEN Inhale 2 L into the lungs continuous.   polyethylene glycol powder 17 GM/SCOOP powder Commonly known as: GLYCOLAX/MIRALAX 1 capful once a day , may use 2 per day if continued constipation    prednisoLONE acetate 1 % ophthalmic suspension Commonly known as: PRED FORTE prednisolone acetate 1 % eye drops,suspension  INSTILL 1 DROP INTO LEFT EYE 3 TIMES A DAY   predniSONE 10 MG tablet Commonly known as: DELTASONE Take 6 tablets (60 mg total) by mouth daily for 2 days, THEN 5 tablets (50 mg total) daily for 2 days, THEN 4 tablets (40 mg total) daily for 2 days, THEN 3 tablets (30 mg total) daily for 2 days, THEN 2 tablets (20 mg total) daily for 2 days, THEN 1 tablet (10 mg total) daily for 2 days. Start taking on: November 10, 2022   PreserVision/Lutein Caps Take 1 capsule by mouth daily.   HAIR SKIN AND NAILS FORMULA PO Take 1 tablet by mouth daily.   sodium chloride 5 % ophthalmic solution Commonly known as: MURO 128 Place 1 drop into the right eye at bedtime.   triamcinolone cream 0.1 % Commonly known as: KENALOG Apply 1 Application topically 2 (two) times daily as needed.   verapamil 180 MG CR tablet Commonly known as: CALAN-SR Take by mouth.   Vitamin D (Ergocalciferol) 1.25 MG (50000 UNIT) Caps capsule Commonly known as: DRISDOL Take 50,000 Units by mouth daily.   vitamin E 180 MG (400 UNITS) capsule vitamin E 268 mg (400 unit) capsule   400 units by oral route.        Follow-up Information     Paraschos, Alexander, MD. Go in 1 week(s).   Specialty: Cardiology Contact information: [address] [facility] [place] [address] [phone]                Discharge Exam: Ceasar Mons Weights   11/08/22 1655 11/10/22 0832  Weight: 66.7 kg 56.8 kg   ***  Condition at discharge: {DC Condition:26389}  The results of significant diagnostics from this hospitalization (including imaging, microbiology, ancillary and laboratory) are listed below for reference.   Imaging Studies: ECHOCARDIOGRAM COMPLETE  Result Date: 11/10/2022    ECHOCARDIOGRAM REPORT   Patient Name:   Susan Sosa Date of Exam: 11/10/2022 Medical Rec  #:  [id number]    Height:       59.0 in Accession #:    [id number]   Weight:       147.0 lb Date of Birth:  04/08/1940     BSA:          1.618 m Patient Age:    82 years     BP:           167/92 mmHg Patient Gender: F            HR:           71 bpm. Exam Location:  [facility] Procedure: 2D Echo, Cardiac Doppler and Color Doppler Indications:     Pulmonary HTN  History:         Patient has no prior history of Echocardiogram examinations.  Physician Discharge Summary   Patient: Susan Sosa MRN: [id number] DOB: 1940-03-31  Admit date:     11/08/2022  Discharge date: 11/10/22  Discharge Physician: Marcelino Duster   PCP: Marisue Ivan, MD   Recommendations at discharge:  {Tip this will not be part of the note when signed- Example include specific recommendations for outpatient follow-up, pending tests to follow-up on. (Optional):26781}  PCP follow up in 1 week Cardiology and Pulmonary follow up as scheduled  Discharge Diagnoses: Principal Problem:   Idiopathic pulmonary fibrosis (HCC) Active Problems:   Benign essential hypertension   Paroxysmal atrial tachycardia (HCC)   Chronic hyponatremia  Resolved Problems:   * No resolved hospital problems. [facility] Course: No notes on file  Assessment and Plan: Paroxysmal atrial tachycardia (HCC) Continue verapamil. CTA pending. Will defer to cardiology for anticoagulation consideration.     Benign essential hypertension Vitals:   11/08/22 1655 11/08/22 1845 11/08/22 1900  BP: (!) 200/92 (!) 186/80 (!) 179/72  Cont clonidine, Cozaar, verapamil. As needed hydralazine.  Chronic hyponatremia Attribute to patient's furosemide. Mild and chronic will monitor.  No neurological symptoms.      {Tip this will not be part of the note when signed Body mass index is 25.31 kg/m. , ,  (Optional):26781}  {(NOTE) Pain control PDMP Statment (Optional):26782} Consultants: *** Procedures performed: ***  Disposition: {Plan; Disposition:26390} Diet recommendation:  Discharge Diet Orders (From admission, onward)     Start     Ordered   11/10/22 0000  Diet - low sodium heart healthy        11/10/22 1135           {Diet_Plan:26776} DISCHARGE MEDICATION: Allergies as of 11/10/2022       Reactions   Latex Itching, Rash   Lidocaine Swelling   Facial swelling from a dentist Facial swelling from a dentist Facial swelling from a dentist   Atorvastatin     Severe hip pain   Losartan Swelling   Metronidazole Other (See Comments)   Sore joints Sore joints   Oxycodone Nausea Only        Medication List     STOP taking these medications    methylPREDNISolone 4 MG Tbpk tablet Commonly known as: MEDROL DOSEPAK       TAKE these medications    albuterol (2.5 MG/3ML) 0.083% nebulizer solution Commonly known as: PROVENTIL Take 2.5 mg by nebulization 4 (four) times daily.   ascorbic acid 500 MG tablet Commonly known as: VITAMIN C ascorbic acid (vitamin C) 500 mg tablet   1  by oral route.   aspirin 81 MG chewable tablet Chew 81 mg by mouth every evening.   atorvastatin 40 MG tablet Commonly known as: LIPITOR Take 1 tablet by mouth daily.   cloNIDine 0.2 MG tablet Commonly known as: CATAPRES Take 0.2 mg by mouth 2 (two) times daily.   cyanocobalamin 1000 MCG tablet Take 1,000 mcg by mouth daily.   Esbriet 267 MG Caps Generic drug: Pirfenidone Take 534 mg by mouth with breakfast, with lunch, and with evening meal.   esomeprazole 40 MG capsule Commonly known as: NEXIUM Take 1 capsule by mouth at bedtime.   FIBER-CAPS PO Take 1 tablet by mouth daily.   Fish Oil 1200 MG Caps Take 1 capsule by mouth daily.   furosemide 20 MG tablet Commonly known as: LASIX Take 1 tablet by mouth daily.   glucosamine-chondroitin 500-400 MG tablet Take 1 tablet by mouth daily.   losartan 50 MG tablet Commonly known  Physician Discharge Summary   Patient: Susan Sosa MRN: [id number] DOB: 1940-03-31  Admit date:     11/08/2022  Discharge date: 11/10/22  Discharge Physician: Marcelino Duster   PCP: Marisue Ivan, MD   Recommendations at discharge:  {Tip this will not be part of the note when signed- Example include specific recommendations for outpatient follow-up, pending tests to follow-up on. (Optional):26781}  PCP follow up in 1 week Cardiology and Pulmonary follow up as scheduled  Discharge Diagnoses: Principal Problem:   Idiopathic pulmonary fibrosis (HCC) Active Problems:   Benign essential hypertension   Paroxysmal atrial tachycardia (HCC)   Chronic hyponatremia  Resolved Problems:   * No resolved hospital problems. [facility] Course: No notes on file  Assessment and Plan: Paroxysmal atrial tachycardia (HCC) Continue verapamil. CTA pending. Will defer to cardiology for anticoagulation consideration.     Benign essential hypertension Vitals:   11/08/22 1655 11/08/22 1845 11/08/22 1900  BP: (!) 200/92 (!) 186/80 (!) 179/72  Cont clonidine, Cozaar, verapamil. As needed hydralazine.  Chronic hyponatremia Attribute to patient's furosemide. Mild and chronic will monitor.  No neurological symptoms.      {Tip this will not be part of the note when signed Body mass index is 25.31 kg/m. , ,  (Optional):26781}  {(NOTE) Pain control PDMP Statment (Optional):26782} Consultants: *** Procedures performed: ***  Disposition: {Plan; Disposition:26390} Diet recommendation:  Discharge Diet Orders (From admission, onward)     Start     Ordered   11/10/22 0000  Diet - low sodium heart healthy        11/10/22 1135           {Diet_Plan:26776} DISCHARGE MEDICATION: Allergies as of 11/10/2022       Reactions   Latex Itching, Rash   Lidocaine Swelling   Facial swelling from a dentist Facial swelling from a dentist Facial swelling from a dentist   Atorvastatin     Severe hip pain   Losartan Swelling   Metronidazole Other (See Comments)   Sore joints Sore joints   Oxycodone Nausea Only        Medication List     STOP taking these medications    methylPREDNISolone 4 MG Tbpk tablet Commonly known as: MEDROL DOSEPAK       TAKE these medications    albuterol (2.5 MG/3ML) 0.083% nebulizer solution Commonly known as: PROVENTIL Take 2.5 mg by nebulization 4 (four) times daily.   ascorbic acid 500 MG tablet Commonly known as: VITAMIN C ascorbic acid (vitamin C) 500 mg tablet   1  by oral route.   aspirin 81 MG chewable tablet Chew 81 mg by mouth every evening.   atorvastatin 40 MG tablet Commonly known as: LIPITOR Take 1 tablet by mouth daily.   cloNIDine 0.2 MG tablet Commonly known as: CATAPRES Take 0.2 mg by mouth 2 (two) times daily.   cyanocobalamin 1000 MCG tablet Take 1,000 mcg by mouth daily.   Esbriet 267 MG Caps Generic drug: Pirfenidone Take 534 mg by mouth with breakfast, with lunch, and with evening meal.   esomeprazole 40 MG capsule Commonly known as: NEXIUM Take 1 capsule by mouth at bedtime.   FIBER-CAPS PO Take 1 tablet by mouth daily.   Fish Oil 1200 MG Caps Take 1 capsule by mouth daily.   furosemide 20 MG tablet Commonly known as: LASIX Take 1 tablet by mouth daily.   glucosamine-chondroitin 500-400 MG tablet Take 1 tablet by mouth daily.   losartan 50 MG tablet Commonly known  Physician Discharge Summary   Patient: Susan Sosa MRN: [id number] DOB: 1940-03-31  Admit date:     11/08/2022  Discharge date: 11/10/22  Discharge Physician: Marcelino Duster   PCP: Marisue Ivan, MD   Recommendations at discharge:  {Tip this will not be part of the note when signed- Example include specific recommendations for outpatient follow-up, pending tests to follow-up on. (Optional):26781}  PCP follow up in 1 week Cardiology and Pulmonary follow up as scheduled  Discharge Diagnoses: Principal Problem:   Idiopathic pulmonary fibrosis (HCC) Active Problems:   Benign essential hypertension   Paroxysmal atrial tachycardia (HCC)   Chronic hyponatremia  Resolved Problems:   * No resolved hospital problems. [facility] Course: No notes on file  Assessment and Plan: Paroxysmal atrial tachycardia (HCC) Continue verapamil. CTA pending. Will defer to cardiology for anticoagulation consideration.     Benign essential hypertension Vitals:   11/08/22 1655 11/08/22 1845 11/08/22 1900  BP: (!) 200/92 (!) 186/80 (!) 179/72  Cont clonidine, Cozaar, verapamil. As needed hydralazine.  Chronic hyponatremia Attribute to patient's furosemide. Mild and chronic will monitor.  No neurological symptoms.      {Tip this will not be part of the note when signed Body mass index is 25.31 kg/m. , ,  (Optional):26781}  {(NOTE) Pain control PDMP Statment (Optional):26782} Consultants: *** Procedures performed: ***  Disposition: {Plan; Disposition:26390} Diet recommendation:  Discharge Diet Orders (From admission, onward)     Start     Ordered   11/10/22 0000  Diet - low sodium heart healthy        11/10/22 1135           {Diet_Plan:26776} DISCHARGE MEDICATION: Allergies as of 11/10/2022       Reactions   Latex Itching, Rash   Lidocaine Swelling   Facial swelling from a dentist Facial swelling from a dentist Facial swelling from a dentist   Atorvastatin     Severe hip pain   Losartan Swelling   Metronidazole Other (See Comments)   Sore joints Sore joints   Oxycodone Nausea Only        Medication List     STOP taking these medications    methylPREDNISolone 4 MG Tbpk tablet Commonly known as: MEDROL DOSEPAK       TAKE these medications    albuterol (2.5 MG/3ML) 0.083% nebulizer solution Commonly known as: PROVENTIL Take 2.5 mg by nebulization 4 (four) times daily.   ascorbic acid 500 MG tablet Commonly known as: VITAMIN C ascorbic acid (vitamin C) 500 mg tablet   1  by oral route.   aspirin 81 MG chewable tablet Chew 81 mg by mouth every evening.   atorvastatin 40 MG tablet Commonly known as: LIPITOR Take 1 tablet by mouth daily.   cloNIDine 0.2 MG tablet Commonly known as: CATAPRES Take 0.2 mg by mouth 2 (two) times daily.   cyanocobalamin 1000 MCG tablet Take 1,000 mcg by mouth daily.   Esbriet 267 MG Caps Generic drug: Pirfenidone Take 534 mg by mouth with breakfast, with lunch, and with evening meal.   esomeprazole 40 MG capsule Commonly known as: NEXIUM Take 1 capsule by mouth at bedtime.   FIBER-CAPS PO Take 1 tablet by mouth daily.   Fish Oil 1200 MG Caps Take 1 capsule by mouth daily.   furosemide 20 MG tablet Commonly known as: LASIX Take 1 tablet by mouth daily.   glucosamine-chondroitin 500-400 MG tablet Take 1 tablet by mouth daily.   losartan 50 MG tablet Commonly known  Physician Discharge Summary   Patient: Susan Sosa MRN: [id number] DOB: 1940-03-31  Admit date:     11/08/2022  Discharge date: 11/10/22  Discharge Physician: Marcelino Duster   PCP: Marisue Ivan, MD   Recommendations at discharge:  {Tip this will not be part of the note when signed- Example include specific recommendations for outpatient follow-up, pending tests to follow-up on. (Optional):26781}  PCP follow up in 1 week Cardiology and Pulmonary follow up as scheduled  Discharge Diagnoses: Principal Problem:   Idiopathic pulmonary fibrosis (HCC) Active Problems:   Benign essential hypertension   Paroxysmal atrial tachycardia (HCC)   Chronic hyponatremia  Resolved Problems:   * No resolved hospital problems. [facility] Course: No notes on file  Assessment and Plan: Paroxysmal atrial tachycardia (HCC) Continue verapamil. CTA pending. Will defer to cardiology for anticoagulation consideration.     Benign essential hypertension Vitals:   11/08/22 1655 11/08/22 1845 11/08/22 1900  BP: (!) 200/92 (!) 186/80 (!) 179/72  Cont clonidine, Cozaar, verapamil. As needed hydralazine.  Chronic hyponatremia Attribute to patient's furosemide. Mild and chronic will monitor.  No neurological symptoms.      {Tip this will not be part of the note when signed Body mass index is 25.31 kg/m. , ,  (Optional):26781}  {(NOTE) Pain control PDMP Statment (Optional):26782} Consultants: *** Procedures performed: ***  Disposition: {Plan; Disposition:26390} Diet recommendation:  Discharge Diet Orders (From admission, onward)     Start     Ordered   11/10/22 0000  Diet - low sodium heart healthy        11/10/22 1135           {Diet_Plan:26776} DISCHARGE MEDICATION: Allergies as of 11/10/2022       Reactions   Latex Itching, Rash   Lidocaine Swelling   Facial swelling from a dentist Facial swelling from a dentist Facial swelling from a dentist   Atorvastatin     Severe hip pain   Losartan Swelling   Metronidazole Other (See Comments)   Sore joints Sore joints   Oxycodone Nausea Only        Medication List     STOP taking these medications    methylPREDNISolone 4 MG Tbpk tablet Commonly known as: MEDROL DOSEPAK       TAKE these medications    albuterol (2.5 MG/3ML) 0.083% nebulizer solution Commonly known as: PROVENTIL Take 2.5 mg by nebulization 4 (four) times daily.   ascorbic acid 500 MG tablet Commonly known as: VITAMIN C ascorbic acid (vitamin C) 500 mg tablet   1  by oral route.   aspirin 81 MG chewable tablet Chew 81 mg by mouth every evening.   atorvastatin 40 MG tablet Commonly known as: LIPITOR Take 1 tablet by mouth daily.   cloNIDine 0.2 MG tablet Commonly known as: CATAPRES Take 0.2 mg by mouth 2 (two) times daily.   cyanocobalamin 1000 MCG tablet Take 1,000 mcg by mouth daily.   Esbriet 267 MG Caps Generic drug: Pirfenidone Take 534 mg by mouth with breakfast, with lunch, and with evening meal.   esomeprazole 40 MG capsule Commonly known as: NEXIUM Take 1 capsule by mouth at bedtime.   FIBER-CAPS PO Take 1 tablet by mouth daily.   Fish Oil 1200 MG Caps Take 1 capsule by mouth daily.   furosemide 20 MG tablet Commonly known as: LASIX Take 1 tablet by mouth daily.   glucosamine-chondroitin 500-400 MG tablet Take 1 tablet by mouth daily.   losartan 50 MG tablet Commonly known

## 2022-11-13 LAB — CULTURE, BLOOD (SINGLE): Culture: NO GROWTH

## 2023-04-06 ENCOUNTER — Other Ambulatory Visit: Payer: Self-pay | Admitting: Pulmonary Disease

## 2023-04-06 DIAGNOSIS — J849 Interstitial pulmonary disease, unspecified: Secondary | ICD-10-CM

## 2023-04-12 ENCOUNTER — Ambulatory Visit
Admission: RE | Admit: 2023-04-12 | Discharge: 2023-04-12 | Disposition: A | Discharge status: Home / Self Care | Source: Ambulatory Visit | Attending: Pulmonary Disease | Admitting: Pulmonary Disease

## 2023-04-12 DIAGNOSIS — J849 Interstitial pulmonary disease, unspecified: Secondary | ICD-10-CM | POA: Insufficient documentation

## 2023-08-10 ENCOUNTER — Encounter: Payer: Self-pay | Admitting: Internal Medicine

## 2023-08-10 ENCOUNTER — Encounter
Admission: RE | Disposition: A | Discharge status: Home / Self Care | Payer: Self-pay | Source: Home / Self Care | Attending: Internal Medicine

## 2023-08-10 ENCOUNTER — Other Ambulatory Visit: Payer: Self-pay

## 2023-08-10 ENCOUNTER — Ambulatory Visit
Admission: RE | Admit: 2023-08-10 | Discharge: 2023-08-10 | Disposition: A | Discharge status: Home / Self Care | Attending: Internal Medicine | Admitting: Internal Medicine

## 2023-08-10 DIAGNOSIS — R0602 Shortness of breath: Secondary | ICD-10-CM | POA: Diagnosis present

## 2023-08-10 DIAGNOSIS — I272 Pulmonary hypertension, unspecified: Secondary | ICD-10-CM | POA: Insufficient documentation

## 2023-08-10 DIAGNOSIS — I739 Peripheral vascular disease, unspecified: Secondary | ICD-10-CM | POA: Insufficient documentation

## 2023-08-10 HISTORY — PX: RIGHT HEART CATH: CATH118263

## 2023-08-10 LAB — POCT I-STAT EG7
Acid-Base Excess: 2 mmol/L (ref 0.0–2.0)
Bicarbonate: 25.8 mmol/L (ref 20.0–28.0)
Calcium, Ion: 1.19 mmol/L (ref 1.15–1.40)
HCT: 41 % (ref 36.0–46.0)
Hemoglobin: 13.9 g/dL (ref 12.0–15.0)
O2 Saturation: 49 %
Potassium: 3.7 mmol/L (ref 3.5–5.1)
Sodium: 138 mmol/L (ref 135–145)
TCO2: 27 mmol/L (ref 22–32)
pCO2, Ven: 37.6 mmHg — ABNORMAL LOW (ref 44–60)
pH, Ven: 7.445 — ABNORMAL HIGH (ref 7.25–7.43)
pO2, Ven: 25 mmHg — CL (ref 32–45)

## 2023-08-10 SURGERY — RIGHT HEART CATH
Anesthesia: Moderate Sedation | Laterality: Right

## 2023-08-10 MED ORDER — SODIUM CHLORIDE 0.9 % WEIGHT BASED INFUSION: 3.0000 mL/kg/h | INTRAVENOUS | Status: DC

## 2023-08-10 MED ORDER — SODIUM CHLORIDE 0.9% FLUSH: 3.0000 mL | INTRAVENOUS | Status: DC | PRN

## 2023-08-10 MED ORDER — SODIUM CHLORIDE 0.9 % WEIGHT BASED INFUSION: 1.0000 mL/kg/h | INTRAVENOUS | Status: DC

## 2023-08-10 MED ORDER — HEPARIN (PORCINE) IN NACL 2000-0.9 UNIT/L-% IV SOLN
INTRAVENOUS | Status: DC | PRN
  Administered 2023-08-10: 1000 mL

## 2023-08-10 MED ORDER — SODIUM CHLORIDE 0.9 % IV SOLN
250.0000 mL | INTRAVENOUS | Status: DC | PRN
Start: 2023-08-10 — End: 2023-08-10

## 2023-08-10 MED ORDER — SODIUM CHLORIDE 0.9 % IV SOLN: INTRAVENOUS | Status: DC

## 2023-08-10 MED ORDER — SODIUM CHLORIDE 0.9% FLUSH: 3.0000 mL | Freq: Two times a day (BID) | INTRAVENOUS | Status: DC

## 2023-08-10 MED ORDER — ACETAMINOPHEN 325 MG PO TABS: 650.0000 mg | ORAL_TABLET | ORAL | Status: DC | PRN

## 2023-08-10 MED ORDER — HYDRALAZINE HCL 20 MG/ML IJ SOLN: 10.0000 mg | INTRAMUSCULAR | Status: DC | PRN

## 2023-08-10 MED ORDER — ONDANSETRON HCL 4 MG/2ML IJ SOLN: 4.0000 mg | Freq: Four times a day (QID) | INTRAMUSCULAR | Status: DC | PRN

## 2023-08-10 SURGICAL SUPPLY — 6 items
CATH BALLN WEDGE 5F 110CM (CATHETERS) IMPLANT
DRAPE BRACHIAL (DRAPES) IMPLANT
GUIDEWIRE .025 260CM (WIRE) IMPLANT
PACK CARDIAC CATH (CUSTOM PROCEDURE TRAY) ×1 IMPLANT
SET ATX-X65L (MISCELLANEOUS) IMPLANT
SHEATH GLIDE SLENDER 4/5FR (SHEATH) IMPLANT

## 2023-08-10 NOTE — Discharge Instructions (Addendum)
 Right Heart Cath, Care After This sheet gives you information about how to care for yourself after your procedure. Your health care provider may also give you more specific instructions. If you have problems or questions, contact your health care provider. What can I expect after the procedure? After the procedure, it is common to have: Bruising or mild discomfort in the area where the IV was inserted (insertion site). Follow these instructions at home: Eating and drinking  You may eat and drink after your procedure.  Drink a lot of fluids for the first several days after the procedure, as directed by your health care provider. This helps to wash (flush) the contrast out of your body. Examples of healthy fluids include water or low-calorie drinks. General instructions Check your IV insertion area and also your venous access site every day for signs of infection. Check for: Redness, swelling, or pain. Fluid or blood. Warmth. Pus or a bad smell. Take over-the-counter and prescription medicines only as told by your health care provider. Rest and return to your normal activities as told by your health care provider. Ask your health care provider what activities are safe for you. Do not lift more than 10 pounds with right arm for 3 days  Do not drive for 24 hours if you were given a medicine to help you relax (sedative), or until your health care provider approves. Keep all follow-up visits as told by your health care provider. This is important. Contact a health care provider if: Your skin becomes itchy or you develop a rash or hives. You have a fever that does not get better with medicine. You feel nauseous. You vomit. You have redness, swelling, or pain around the insertion site. You have fluid or blood coming from the insertion site. Your insertion area feels warm to the touch. You have pus or a bad smell coming from the insertion site. Get help right away if: You have difficulty  breathing or shortness of breath. You develop chest pain. You faint. You feel very dizzy. These symptoms may represent a serious problem that is an emergency. Do not wait to see if the symptoms will go away. Get medical help right away. Call your local emergency services (911 in the [place] Do not drive yourself to the hospital. Summary After your procedure, it is common to have bruising or mild discomfort in the area where the IV was inserted. You should check your IV insertion area every day for signs of infection. Take over-the-counter and prescription medicines only as told by your health care provider. You should drink a lot of fluids for the first several days after the procedure to help flush the contrast from your body. This information is not intended to replace advice given to you by your health care provider. Make sure you discuss any questions you have with your health care provider. Document Released: 11/01/2012 Document Revised: 12/24/2016 Document Reviewed: 12/06/2015 Elsevier Patient Education  2020 [employer].

## 2023-08-11 ENCOUNTER — Encounter: Payer: Self-pay | Admitting: Internal Medicine

## 2023-08-29 ENCOUNTER — Encounter: Attending: Pulmonary Disease

## 2023-08-29 ENCOUNTER — Other Ambulatory Visit: Payer: Self-pay

## 2023-08-29 DIAGNOSIS — J849 Interstitial pulmonary disease, unspecified: Secondary | ICD-10-CM

## 2023-08-29 NOTE — Progress Notes (Signed)
 Virtual Visit completed. Patient informed on EP and RD appointment and 6 Minute walk test. Patient also informed of patient health questionnaires on My Chart. Patient Verbalizes understanding. Visit diagnosis can be found in [facility] 08/02/2023.

## 2023-09-07 ENCOUNTER — Ambulatory Visit

## 2023-10-12 ENCOUNTER — Other Ambulatory Visit: Payer: Self-pay | Admitting: Emergency Medicine

## 2023-10-12 ENCOUNTER — Ambulatory Visit: Admission: RE | Admit: 2023-10-12 | Source: Ambulatory Visit

## 2023-10-12 ENCOUNTER — Ambulatory Visit
Admission: RE | Admit: 2023-10-12 | Discharge: 2023-10-12 | Disposition: A | Discharge status: Home / Self Care | Source: Ambulatory Visit | Attending: Emergency Medicine

## 2023-10-12 DIAGNOSIS — R0602 Shortness of breath: Secondary | ICD-10-CM | POA: Diagnosis not present

## 2023-10-12 DIAGNOSIS — J84112 Idiopathic pulmonary fibrosis: Secondary | ICD-10-CM | POA: Diagnosis not present

## 2023-10-12 DIAGNOSIS — J849 Interstitial pulmonary disease, unspecified: Secondary | ICD-10-CM | POA: Insufficient documentation

## 2023-10-14 ENCOUNTER — Inpatient Hospital Stay: Admission: EM | Admit: 2023-10-14 | Discharge: 2023-10-18 | DRG: 196 | Disposition: A | Discharge status: Home Health

## 2023-10-14 ENCOUNTER — Other Ambulatory Visit: Payer: Self-pay

## 2023-10-14 ENCOUNTER — Emergency Department

## 2023-10-14 DIAGNOSIS — N179 Acute kidney failure, unspecified: Secondary | ICD-10-CM | POA: Diagnosis present

## 2023-10-14 DIAGNOSIS — Z91040 Latex allergy status: Secondary | ICD-10-CM | POA: Diagnosis not present

## 2023-10-14 DIAGNOSIS — N182 Chronic kidney disease, stage 2 (mild): Secondary | ICD-10-CM | POA: Diagnosis present

## 2023-10-14 DIAGNOSIS — M1990 Unspecified osteoarthritis, unspecified site: Secondary | ICD-10-CM | POA: Diagnosis present

## 2023-10-14 DIAGNOSIS — Z66 Do not resuscitate: Secondary | ICD-10-CM | POA: Diagnosis present

## 2023-10-14 DIAGNOSIS — Z7951 Long term (current) use of inhaled steroids: Secondary | ICD-10-CM

## 2023-10-14 DIAGNOSIS — J9811 Atelectasis: Secondary | ICD-10-CM | POA: Diagnosis present

## 2023-10-14 DIAGNOSIS — R0602 Shortness of breath: Secondary | ICD-10-CM | POA: Diagnosis present

## 2023-10-14 DIAGNOSIS — I10 Essential (primary) hypertension: Secondary | ICD-10-CM | POA: Diagnosis present

## 2023-10-14 DIAGNOSIS — Z888 Allergy status to other drugs, medicaments and biological substances status: Secondary | ICD-10-CM

## 2023-10-14 DIAGNOSIS — Z885 Allergy status to narcotic agent status: Secondary | ICD-10-CM

## 2023-10-14 DIAGNOSIS — J84112 Idiopathic pulmonary fibrosis: Principal | ICD-10-CM | POA: Diagnosis present

## 2023-10-14 DIAGNOSIS — I272 Pulmonary hypertension, unspecified: Secondary | ICD-10-CM | POA: Diagnosis present

## 2023-10-14 DIAGNOSIS — J432 Centrilobular emphysema: Secondary | ICD-10-CM | POA: Diagnosis present

## 2023-10-14 DIAGNOSIS — M179 Osteoarthritis of knee, unspecified: Secondary | ICD-10-CM | POA: Diagnosis present

## 2023-10-14 DIAGNOSIS — I4719 Other supraventricular tachycardia: Secondary | ICD-10-CM | POA: Diagnosis present

## 2023-10-14 DIAGNOSIS — Z87891 Personal history of nicotine dependence: Secondary | ICD-10-CM

## 2023-10-14 DIAGNOSIS — J9621 Acute and chronic respiratory failure with hypoxia: Secondary | ICD-10-CM | POA: Diagnosis present

## 2023-10-14 DIAGNOSIS — E785 Hyperlipidemia, unspecified: Secondary | ICD-10-CM | POA: Diagnosis present

## 2023-10-14 DIAGNOSIS — I428 Other cardiomyopathies: Secondary | ICD-10-CM | POA: Diagnosis present

## 2023-10-14 DIAGNOSIS — Z515 Encounter for palliative care: Secondary | ICD-10-CM | POA: Diagnosis not present

## 2023-10-14 DIAGNOSIS — R350 Frequency of micturition: Secondary | ICD-10-CM | POA: Diagnosis present

## 2023-10-14 DIAGNOSIS — J8410 Pulmonary fibrosis, unspecified: Secondary | ICD-10-CM | POA: Diagnosis not present

## 2023-10-14 DIAGNOSIS — I130 Hypertensive heart and chronic kidney disease with heart failure and stage 1 through stage 4 chronic kidney disease, or unspecified chronic kidney disease: Secondary | ICD-10-CM | POA: Diagnosis present

## 2023-10-14 DIAGNOSIS — Z9981 Dependence on supplemental oxygen: Secondary | ICD-10-CM

## 2023-10-14 DIAGNOSIS — Z7982 Long term (current) use of aspirin: Secondary | ICD-10-CM | POA: Diagnosis not present

## 2023-10-14 DIAGNOSIS — E039 Hypothyroidism, unspecified: Secondary | ICD-10-CM | POA: Diagnosis present

## 2023-10-14 DIAGNOSIS — Z79899 Other long term (current) drug therapy: Secondary | ICD-10-CM | POA: Diagnosis not present

## 2023-10-14 DIAGNOSIS — Z884 Allergy status to anesthetic agent status: Secondary | ICD-10-CM

## 2023-10-14 LAB — BASIC METABOLIC PANEL WITH GFR
Anion gap: 13 (ref 5–15)
BUN: 26 mg/dL — ABNORMAL HIGH (ref 8–23)
CO2: 25 mmol/L (ref 22–32)
Calcium: 8.7 mg/dL — ABNORMAL LOW (ref 8.9–10.3)
Chloride: 99 mmol/L (ref 98–111)
Creatinine, Ser: 0.87 mg/dL (ref 0.44–1.00)
GFR, Estimated: >60 mL/min (ref >60)
Glucose, Bld: 96 mg/dL (ref 70–99)
Potassium: 4.5 mmol/L (ref 3.5–5.1)
Sodium: 137 mmol/L (ref 135–145)

## 2023-10-14 LAB — CBC
HCT: 40.3 % (ref 36.0–46.0)
Hemoglobin: 13.5 g/dL (ref 12.0–15.0)
MCH: 29.6 pg (ref 26.0–34.0)
MCHC: 33.5 g/dL (ref 30.0–36.0)
MCV: 88.4 fL (ref 80.0–100.0)
Platelets: 261 K/uL (ref 150–400)
RBC: 4.56 MIL/uL (ref 3.87–5.11)
RDW: 14.4 % (ref 11.5–15.5)
WBC: 10.9 K/uL — ABNORMAL HIGH (ref 4.0–10.5)
nRBC: 0 % (ref 0.0–0.2)

## 2023-10-14 LAB — CREATININE, SERUM
Creatinine, Ser: 1.18 mg/dL — ABNORMAL HIGH (ref 0.44–1.00)
GFR, Estimated: 45 mL/min — ABNORMAL LOW (ref >60)

## 2023-10-14 LAB — CBC WITH DIFFERENTIAL/PLATELET
Abs Immature Granulocytes: 0.17 K/uL — ABNORMAL HIGH (ref 0.00–0.07)
Basophils Absolute: 0 K/uL (ref 0.0–0.1)
Basophils Relative: 0 %
Eosinophils Absolute: 0 K/uL (ref 0.0–0.5)
Eosinophils Relative: 0 %
HCT: 40.6 % (ref 36.0–46.0)
Hemoglobin: 13.8 g/dL (ref 12.0–15.0)
Immature Granulocytes: 1 %
Lymphocytes Relative: 10 %
Lymphs Abs: 1.5 K/uL (ref 0.7–4.0)
MCH: 30.1 pg (ref 26.0–34.0)
MCHC: 34 g/dL (ref 30.0–36.0)
MCV: 88.6 fL (ref 80.0–100.0)
Monocytes Absolute: 0.8 K/uL (ref 0.1–1.0)
Monocytes Relative: 6 %
Neutro Abs: 11.8 K/uL — ABNORMAL HIGH (ref 1.7–7.7)
Neutrophils Relative %: 83 %
Platelets: 301 K/uL (ref 150–400)
RBC: 4.58 MIL/uL (ref 3.87–5.11)
RDW: 14.2 % (ref 11.5–15.5)
WBC: 14.3 K/uL — ABNORMAL HIGH (ref 4.0–10.5)
nRBC: 0 % (ref 0.0–0.2)

## 2023-10-14 LAB — TROPONIN I (HIGH SENSITIVITY): Troponin I (High Sensitivity): 11 ng/L (ref <18)

## 2023-10-14 LAB — BRAIN NATRIURETIC PEPTIDE: B Natriuretic Peptide: 317.7 pg/mL — ABNORMAL HIGH (ref 0.0–100.0)

## 2023-10-14 MED ORDER — IPRATROPIUM-ALBUTEROL 0.5-2.5 (3) MG/3ML IN SOLN
3.0000 mL | Freq: Once | RESPIRATORY_TRACT | Status: AC
  Administered 2023-10-14: 3 mL via RESPIRATORY_TRACT
  Filled 2023-10-14: qty 3

## 2023-10-14 MED ORDER — ONDANSETRON HCL 4 MG PO TABS: 4.0000 mg | ORAL_TABLET | Freq: Four times a day (QID) | ORAL | Status: DC | PRN

## 2023-10-14 MED ORDER — METHYLPREDNISOLONE SODIUM SUCC 125 MG IJ SOLR
125.0000 mg | Freq: Two times a day (BID) | INTRAMUSCULAR | Status: DC
  Administered 2023-10-15 – 2023-10-16 (×3): 125 mg via INTRAVENOUS
  Filled 2023-10-14 (×3): qty 2

## 2023-10-14 MED ORDER — ONDANSETRON HCL 4 MG/2ML IJ SOLN: 4.0000 mg | Freq: Four times a day (QID) | INTRAMUSCULAR | Status: DC | PRN

## 2023-10-14 MED ORDER — ENOXAPARIN SODIUM 40 MG/0.4ML IJ SOSY
40.0000 mg | PREFILLED_SYRINGE | INTRAMUSCULAR | Status: DC
  Administered 2023-10-14 – 2023-10-17 (×4): 40 mg via SUBCUTANEOUS
  Filled 2023-10-14 (×4): qty 0.4

## 2023-10-14 MED ORDER — METHYLPREDNISOLONE SODIUM SUCC 125 MG IJ SOLR
125.0000 mg | Freq: Once | INTRAMUSCULAR | Status: AC
  Administered 2023-10-14: 125 mg via INTRAVENOUS
  Filled 2023-10-14: qty 2

## 2023-10-14 MED ORDER — MORPHINE SULFATE (PF) 2 MG/ML IV SOLN: 2.0000 mg | INTRAVENOUS | Status: DC | PRN

## 2023-10-14 MED ORDER — HYDRALAZINE HCL 20 MG/ML IJ SOLN
10.0000 mg | Freq: Four times a day (QID) | INTRAMUSCULAR | Status: DC | PRN
  Administered 2023-10-14: 10 mg via INTRAVENOUS

## 2023-10-14 MED ORDER — IPRATROPIUM-ALBUTEROL 0.5-2.5 (3) MG/3ML IN SOLN
3.0000 mL | Freq: Once | RESPIRATORY_TRACT | Status: AC
Start: 2023-10-14 — End: 2023-10-14
  Administered 2023-10-14: 3 mL via RESPIRATORY_TRACT
  Filled 2023-10-14: qty 3

## 2023-10-14 MED ORDER — IPRATROPIUM-ALBUTEROL 0.5-2.5 (3) MG/3ML IN SOLN
3.0000 mL | Freq: Four times a day (QID) | RESPIRATORY_TRACT | Status: DC
  Administered 2023-10-14 – 2023-10-15 (×2): 3 mL via RESPIRATORY_TRACT
  Filled 2023-10-14 (×2): qty 3

## 2023-10-14 MED ORDER — HYDRALAZINE HCL 20 MG/ML IJ SOLN
INTRAMUSCULAR | Status: AC
  Filled 2023-10-14: qty 1

## 2023-10-14 NOTE — ED Triage Notes (Signed)
 Patient to ED for shortness of breath; upon arrival patient's O2 sat 65% on baseline 6L. Patient taken to room 10.

## 2023-10-14 NOTE — H&P (Signed)
 History and Physical    Patient: Susan Sosa FMW:[id number] DOB: [birth year] DOA: 10/14/2023 DOS: the patient was seen and examined on 10/14/2023 PCP: Alla Amis, MD  Patient coming from: Home  Chief Complaint:  Chief Complaint  Patient presents with   Shortness of Breath   HPI: Susan Sosa is a 83 y.o. female with medical history significant of pulmonary fibrosis idiopathic, essential hypertension, hyponatremia, hyperlipidemia, who presented to the ER with progressive shortness of breath and cough .  Patient is normally on 4 to 6 L of oxygen at home.  She came to the ER with oxygen saturation in the 60s.  Sats were 65% on 6 L.  She was seen and evaluated and apparently has had progressive worsening of her pulmonary fibrosis.  She was seen by her pulmonologist 3 days ago who recommended admission at the time but patient declined.  Now that symptoms have gotten worse she is coming to the ER.  Patient had CT scanning of the chest 2 days ago to evaluate for pneumonia that showed the worsening of interstitial pulmonary fibrosis.  Patient is being admitted to the hospital with acute on chronic hypoxic respiratory failure most likely due to worsening pulmonary fibrosis.  Review of Systems: As mentioned in the history of present illness. All other systems reviewed and are negative. Past Medical History:  Diagnosis Date   Arthritis    Heart murmur    Hyperlipidemia    Hypertension    Hyponatremia    IPF (idiopathic pulmonary fibrosis) (HCC)    Past Surgical History:  Procedure Laterality Date   LEFT HEART CATH AND CORONARY ANGIOGRAPHY N/A 11/27/2021   Procedure: LEFT HEART CATH AND CORONARY ANGIOGRAPHY and possible pci and stent;  Surgeon: Florencio Cara BIRCH, MD;  Location: [facility] INVASIVE CV LAB;  Service: Cardiovascular;  Laterality: N/A;   RIGHT HEART CATH Right 08/10/2023   Procedure: RIGHT HEART CATH;  Surgeon: Florencio Cara BIRCH, MD;  Location: [facility] INVASIVE CV LAB;  Service:  Cardiovascular;  Laterality: Right;   Social History:  reports that she has quit smoking. She has never used smokeless tobacco. She reports that she does not drink alcohol and does not use drugs.  Allergies  Allergen Reactions   Latex Itching and Rash   Lidocaine Swelling    Facial swelling from a dentist Facial swelling from a dentist Facial swelling from a dentist    Atorvastatin      Severe hip pain   Losartan  Swelling   Metronidazole Other (See Comments)    Sore joints Sore joints    Oxycodone  Nausea Only    History reviewed. No pertinent family history.  Prior to Admission medications   Medication Sig Start Date End Date Taking? Authorizing Provider  acetylcysteine (MUCOMYST) 20 % nebulizer solution Take 4 mLs by nebulization 3 (three) times daily. 04/05/23 08/01/24  [provider]  acetylcysteine (MUCOMYST) 20 % nebulizer solution Inhale 4 mLs into the lungs. Patient not taking: Reported on 08/29/2023 08/05/23 08/04/24  [provider]  albuterol  (PROVENTIL ) (2.5 MG/3ML) 0.083% nebulizer solution Take 2.5 mg by nebulization in the morning and at bedtime. 11/13/19   [provider]  ascorbic acid  (VITAMIN C) 500 MG tablet ascorbic acid  (vitamin C) 500 mg tablet   1  by oral route. Patient not taking: Reported on 08/29/2023 05/08/21   Lane Shope, MD  aspirin  81 MG chewable tablet Chew 81 mg by mouth every evening.    [provider]  atorvastatin  (LIPITOR) 40 MG tablet Take 40 mg  by mouth daily. 06/27/20   [provider]  azithromycin (ZITHROMAX) 250 MG tablet Take 250 mg by mouth every Monday, Wednesday, and Friday. Patient not taking: Reported on 08/29/2023 07/29/23 10/28/23  [provider]  budesonide-formoterol (SYMBICORT) 160-4.5 MCG/ACT inhaler Inhale 2 puffs into the lungs 2 (two) times daily. 08/02/23 08/01/24  [provider]  Calcium  Polycarbophil (FIBER-CAPS PO) Take 1 tablet by mouth daily.    [provider]  cholecalciferol (VITAMIN D3) 25 MCG (1000 UNIT) tablet Take 1,000 Units by mouth daily.    [provider]  cloNIDine  (CATAPRES  - DOSED IN MG/24 HR) 0.1 mg/24hr patch Place 1 patch onto the skin See admin instructions. Place 1 patch onto the skin every 5 days 08/02/23 08/01/24  [provider]  dexamethasone (DECADRON) 2 MG tablet Take 2 mg by mouth daily. 08/02/23 11/30/23  [provider]  esomeprazole (NEXIUM) 40 MG capsule Take 40 mg by mouth at bedtime. 05/30/20   [provider]  losartan  (COZAAR ) 50 MG tablet Take 25 mg by mouth in the morning and at bedtime.    [provider]  Multiple Vitamins-Minerals (PRESERVISION/LUTEIN) CAPS Take 1 capsule by mouth every evening. Patient not taking: Reported on 08/29/2023    [provider]  nitroGLYCERIN  (NITROSTAT ) 0.4 MG SL tablet Place 1 tablet (0.4 mg total) under the tongue every 5 (five) minutes as needed for chest pain. 11/28/21   Amin, Sumayya, MD  ondansetron  (ZOFRAN ) 8 MG tablet Take 8 mg by mouth every 12 (twelve) hours as needed for vomiting or nausea. 09/29/21   [provider]  OXYGEN Inhale 6 L into the lungs continuous.    [provider]  Pirfenidone  267 MG TABS Take 534 mg by mouth with breakfast, with lunch, and with evening meal. 11/21/19   [provider]  polyethylene glycol powder (GLYCOLAX /MIRALAX ) 17 GM/SCOOP powder 1 capful once a day , may use 2 per day if continued constipation 04/22/21   Gasper Devere ORN, PA-C  prednisoLONE  acetate (PRED FORTE ) 1 % ophthalmic suspension Place 1 drop into the left eye at bedtime. Patient not taking: Reported on 08/29/2023    [provider]  sodium chloride  (MURO 128) 5 % ophthalmic solution Place 1 drop into the right eye at bedtime. Patient not taking: Reported on 08/29/2023    [provider]  verapamil  (CALAN -SR) 180 MG CR tablet Take 180 mg by mouth at bedtime. 07/21/20   [provider]     Physical Exam: Vitals:   10/14/23 1647 10/14/23 1700 10/14/23 1800 10/14/23 1830  BP: (!) 211/126 (!) 178/103 (!) 177/100 (!) 175/94  Pulse: 95 85 94 88  Resp: (!) 27 (!) 25 17 (!) 35  Temp: 97.9 F (36.6 C)     TempSrc: Oral     SpO2: 100% 100% 91% 92%   Constitutional: Chronically ill looking, weak NAD, calm, comfortable Eyes: PERRL, lids and conjunctivae normal ENMT: Mucous membranes are moist. Posterior pharynx clear of any exudate or lesions.Normal dentition.  Neck: normal, supple, no masses, no thyromegaly Respiratory: Decreased air entry bilaterally with coarse breath sounds, no use of extra muscle respiration Cardiovascular: Regular rate and rhythm, no murmurs / rubs / gallops. No extremity edema. 2+ pedal pulses. No carotid bruits.  Abdomen: no tenderness, no masses palpated. No hepatosplenomegaly. Bowel sounds positive.  Musculoskeletal: Good range of motion, no joint swelling or tenderness, Skin: no rashes, lesions, ulcers. No induration Neurologic: CN 2-12 grossly intact. Sensation intact, DTR normal. Strength 5/5  in all 4.  Psychiatric: Normal judgment and insight. Alert and oriented x 3.  Anxious mood  Data Reviewed:  Blood pressure is 211/126, pulse 105 respiratory 27, initial oxygen sats 65% on 6 L, white count 10.9 creatinine 1.18 BUN 26 calcium  8.7.  BNP 317.Chest x-ray showed hypoinflation with chronic changes over the lung bases consistent with pulmonary fibrosis.  Assessment and Plan:  #1 acute on chronic hypoxic respiratory failure: Secondary to worsening pulmonary fibrosis.  Patient will be admitted.  Initiate steroids and nebulizer treatment.  She has no COPD no evidence of infection.  Patient will likely need to get pulmonology consult in the morning to help with any further treatment.  We will titrate her oxygen as tolerated.  #2 interstitial pulmonary fibrosis: Worsening condition.  Probably needs to have pulmonary consult to decide on care plan.   Palliative care consult will be beneficial at this stage.  #3 malignant hypertension: Blood pressure is markedly elevated.  Continue home regimen but add as needed hydralazine .  #4 paroxysmal atrial tachycardia: Patient may require rate control.  She was taking amlodipine.  May resume amlodipine.  #5 nonischemic cardiomyopathy: Appears to be compensated at the moment.  #6 chronic kidney disease stage II: Continue close monitoring.    Advance Care Planning:   Code Status: Full Code   Consults: Patient will need pulmonary consult in the morning  Family Communication: No family at bedside  Severity of Illness: The appropriate patient status for this patient is INPATIENT. Inpatient status is judged to be reasonable and necessary in order to provide the required intensity of service to ensure the patient's safety. The patient's presenting symptoms, physical exam findings, and initial radiographic and laboratory data in the context of their chronic comorbidities is felt to place them at high risk for further clinical deterioration. Furthermore, it is not anticipated that the patient will be medically stable for discharge from the hospital within 2 midnights of admission.   * I certify that at the point of admission it is my clinical judgment that the patient will require inpatient hospital care spanning beyond 2 midnights from the point of admission due to high intensity of service, high risk for further deterioration and high frequency of surveillance required.*  AuthorBETHA SIM KNOLL, MD 10/14/2023 7:06 PM  For on call review [email or web address].

## 2023-10-14 NOTE — ED Provider Notes (Signed)
 [facility] Provider Note    Event Date/Time   First MD Initiated Contact with Patient 10/14/23 1641     (approximate)   History   Shortness of Breath   HPI  Susan Sosa is a 83 y.o. female who presented to the emergency department today because of concerns for shortness of breath.  Patient has history of pulmonary fibrosis.  Normally on 6 L of oxygen.  Breathing has gotten worse over the past week.  Saw her pulmonologist 3 days ago who recommended admission at that time.  Did obtain a CT scan 2 days ago to evaluate for pneumonia which was negative for pneumonia but showed worsening interstitial pulmonary fibrosis.  Patient denies any fevers or chills.     Physical Exam   Triage Vital Signs: ED Triage Vitals  Encounter Vitals Group     BP 10/14/23 1643 (!) 211/126     Girls Systolic BP Percentile --      Girls Diastolic BP Percentile --      Boys Systolic BP Percentile --      Boys Diastolic BP Percentile --      Pulse Rate 10/14/23 1643 98     Resp 10/14/23 1643 (!) 32     Temp 10/14/23 1647 97.9 F (36.6 C)     Temp Source 10/14/23 1647 Oral     SpO2 10/14/23 1640 (!) 65 %     Weight --      Height --      Head Circumference --      Peak Flow --      Pain Score 10/14/23 1642 0     Pain Loc --      Pain Education --      Exclude from Growth Chart --     Most recent vital signs: Vitals:   10/14/23 1643 10/14/23 1647  BP: (!) 211/126 (!) 211/126  Pulse: 98 95  Resp: (!) 32 (!) 27  Temp:  97.9 F (36.6 C)  SpO2:  100%   General: Awake, alert, oriented. CV:  Good peripheral perfusion. Regular rate and rhythm. Resp:  Wheezing noted throughout lungs. Increased work of breathing. Abd:  No distention.  Other:  No lower extremity edema.   ED Results / Procedures / Treatments   Labs (all labs ordered are listed, but only abnormal results are displayed) Labs Reviewed  CBC WITH DIFFERENTIAL/PLATELET - Abnormal; Notable for the  following components:      Result Value   WBC 14.3 (*)    Neutro Abs 11.8 (*)    Abs Immature Granulocytes 0.17 (*)    All other components within normal limits  BASIC METABOLIC PANEL WITH GFR - Abnormal; Notable for the following components:   BUN 26 (*)    Calcium  8.7 (*)    All other components within normal limits  BRAIN NATRIURETIC PEPTIDE - Abnormal; Notable for the following components:   B Natriuretic Peptide 317.7 (*)    All other components within normal limits  CBC  CREATININE, SERUM  CBC  COMPREHENSIVE METABOLIC PANEL WITH GFR  TROPONIN I (HIGH SENSITIVITY)     EKG  I, Guadalupe Eagles, attending physician, personally viewed and interpreted this EKG  EKG Time: 1643 Rate: 99 Rhythm: sinus rhythm Axis: right axis deviation Intervals: qtc 466 QRS: narrow ST changes: no st elevation Impression: abnormal ekg  RADIOLOGY I independently interpreted and visualized the CXR. My interpretation: Pulmonary fibrosis Radiology interpretation:  IMPRESSION:  Hypoinflation with chronic changes over  the lung bases compatible  with recent CT findings of pulmonary fibrosis.      PROCEDURES:  Critical Care performed: Yes  CRITICAL CARE Performed by: Guadalupe Eagles   Total critical care time: 30 minutes  Critical care time was exclusive of separately billable procedures and treating other patients.  Critical care was necessary to treat or prevent imminent or life-threatening deterioration.  Critical care was time spent personally by me on the following activities: development of treatment plan with patient and/or surrogate as well as nursing, discussions with consultants, evaluation of patient's response to treatment, examination of patient, obtaining history from patient or surrogate, ordering and performing treatments and interventions, ordering and review of laboratory studies, ordering and review of radiographic studies, pulse oximetry and re-evaluation of patient's  condition.   Procedures    MEDICATIONS ORDERED IN ED: Medications - No data to display   IMPRESSION / MDM / ASSESSMENT AND PLAN / ED COURSE  I reviewed the triage vital signs and the nursing notes.                              Differential diagnosis includes, but is not limited to, pneumonia, PE, pulmonary fibrosis, viral illness  Patient's presentation is most consistent with acute presentation with potential threat to life or bodily function.   The patient is on the cardiac monitor to evaluate for evidence of arrhythmia and/or significant heart rate changes.  Patient noted to the emergency department today with continued shortness of breath in the setting of severe pulmonary fibrosis.  Upon arrival patient was found to be satting in the 60s so was brought quickly back to a treatment room.  She was initially placed on a nonrebreather.  Lung exam was notable for diffuse wheezing.  Patient did have significant increased work of breathing.  Patient was written for IV steroids and breathing treatments.  This did significantly help the patient's breathing and she was able to be placed back on nasal cannula.  Chest x-ray without any pneumonia.  Blood work shows a leukocytosis however this could be due to recent steroid use.  Given significance of the patient's breathing difficulty do think she would benefit from further workup and management.  Will discuss with hospitalist service for treatment.      FINAL CLINICAL IMPRESSION(S) / ED DIAGNOSES   Final diagnoses:  Shortness of breath  Pulmonary fibrosis (HCC)     Note:  This document was prepared using Dragon voice recognition software and may include unintentional dictation errors.    Eagles Guadalupe, MD 10/14/23 TYRA

## 2023-10-14 NOTE — ED Notes (Signed)
 Fall risk bundle is currently in place.

## 2023-10-15 DIAGNOSIS — J9621 Acute and chronic respiratory failure with hypoxia: Secondary | ICD-10-CM

## 2023-10-15 LAB — CBC
HCT: 39.6 % (ref 36.0–46.0)
Hemoglobin: 13.2 g/dL (ref 12.0–15.0)
MCH: 29.6 pg (ref 26.0–34.0)
MCHC: 33.3 g/dL (ref 30.0–36.0)
MCV: 88.8 fL (ref 80.0–100.0)
Platelets: 267 K/uL (ref 150–400)
RBC: 4.46 MIL/uL (ref 3.87–5.11)
RDW: 14.1 % (ref 11.5–15.5)
WBC: 10.5 K/uL (ref 4.0–10.5)
nRBC: 0 % (ref 0.0–0.2)

## 2023-10-15 LAB — RESPIRATORY PANEL BY PCR

## 2023-10-15 LAB — COMPREHENSIVE METABOLIC PANEL WITH GFR
ALT: 20 U/L (ref 0–44)
AST: 20 U/L (ref 15–41)
Albumin: 3.3 g/dL — ABNORMAL LOW (ref 3.5–5.0)
Alkaline Phosphatase: 48 U/L (ref 38–126)
Anion gap: 12 (ref 5–15)
BUN: 26 mg/dL — ABNORMAL HIGH (ref 8–23)
CO2: 26 mmol/L (ref 22–32)
Calcium: 8.4 mg/dL — ABNORMAL LOW (ref 8.9–10.3)
Chloride: 101 mmol/L (ref 98–111)
Creatinine, Ser: 0.79 mg/dL (ref 0.44–1.00)
GFR, Estimated: >60 mL/min (ref >60)
Glucose, Bld: 114 mg/dL — ABNORMAL HIGH (ref 70–99)
Potassium: 4.3 mmol/L (ref 3.5–5.1)
Sodium: 139 mmol/L (ref 135–145)
Total Bilirubin: 0.6 mg/dL (ref 0.0–1.2)
Total Protein: 6.4 g/dL — ABNORMAL LOW (ref 6.5–8.1)

## 2023-10-15 LAB — SARS CORONAVIRUS 2 BY RT PCR: SARS Coronavirus 2 by RT PCR: NEGATIVE

## 2023-10-15 LAB — SEDIMENTATION RATE: Sed Rate: 12 mm/h (ref 0–30)

## 2023-10-15 LAB — C-REACTIVE PROTEIN: CRP: 0.6 mg/dL (ref <1.0)

## 2023-10-15 MED ORDER — TREPROSTINIL 16 MCG IN POWD: 16.0000 ug | Freq: Three times a day (TID) | RESPIRATORY_TRACT | Status: DC

## 2023-10-15 MED ORDER — VERAPAMIL HCL ER 180 MG PO TBCR
180.0000 mg | EXTENDED_RELEASE_TABLET | Freq: Every day | ORAL | Status: DC
  Administered 2023-10-15 – 2023-10-17 (×3): 180 mg via ORAL
  Filled 2023-10-15 (×4): qty 1

## 2023-10-15 MED ORDER — HYDRALAZINE HCL 20 MG/ML IJ SOLN: 10.0000 mg | Freq: Four times a day (QID) | INTRAMUSCULAR | Status: DC | PRN

## 2023-10-15 MED ORDER — PANTOPRAZOLE SODIUM 40 MG PO TBEC
40.0000 mg | DELAYED_RELEASE_TABLET | Freq: Every day | ORAL | Status: DC
  Administered 2023-10-15 – 2023-10-17 (×3): 40 mg via ORAL
  Filled 2023-10-15 (×3): qty 1

## 2023-10-15 MED ORDER — ATORVASTATIN CALCIUM 20 MG PO TABS
40.0000 mg | ORAL_TABLET | Freq: Every day | ORAL | Status: DC
Start: 2023-10-15 — End: 2023-10-18
  Administered 2023-10-15 – 2023-10-18 (×4): 40 mg via ORAL
  Filled 2023-10-15 (×4): qty 2

## 2023-10-15 MED ORDER — ACETYLCYSTEINE 20 % IN SOLN
4.0000 mL | Freq: Three times a day (TID) | RESPIRATORY_TRACT | Status: DC
  Administered 2023-10-15 – 2023-10-18 (×10): 4 mL via RESPIRATORY_TRACT
  Filled 2023-10-15 (×13): qty 4

## 2023-10-15 MED ORDER — ASPIRIN 81 MG PO CHEW
81.0000 mg | CHEWABLE_TABLET | Freq: Every evening | ORAL | Status: DC
  Administered 2023-10-15 – 2023-10-17 (×3): 81 mg via ORAL
  Filled 2023-10-15 (×3): qty 1

## 2023-10-15 MED ORDER — PIRFENIDONE 267 MG PO TABS
534.0000 mg | ORAL_TABLET | Freq: Three times a day (TID) | ORAL | Status: DC
  Administered 2023-10-15 – 2023-10-18 (×6): 534 mg via ORAL
  Filled 2023-10-15 (×20): qty 2

## 2023-10-15 MED ORDER — CLONIDINE HCL 0.1 MG/24HR TD PTWK
0.1000 mg | MEDICATED_PATCH | TRANSDERMAL | Status: DC
  Administered 2023-10-15: 0.1 mg via TRANSDERMAL
  Filled 2023-10-15: qty 1

## 2023-10-15 MED ORDER — LOSARTAN POTASSIUM 25 MG PO TABS
25.0000 mg | ORAL_TABLET | Freq: Every day | ORAL | Status: DC
  Administered 2023-10-15 – 2023-10-18 (×4): 25 mg via ORAL
  Filled 2023-10-15 (×4): qty 1

## 2023-10-15 MED ORDER — IPRATROPIUM-ALBUTEROL 0.5-2.5 (3) MG/3ML IN SOLN
3.0000 mL | Freq: Three times a day (TID) | RESPIRATORY_TRACT | Status: DC
  Administered 2023-10-15 – 2023-10-18 (×10): 3 mL via RESPIRATORY_TRACT
  Filled 2023-10-15 (×10): qty 3

## 2023-10-15 NOTE — Progress Notes (Signed)
 PROGRESS NOTE    Susan Sosa  FMW:[id number] DOB: 1941-01-22 DOA: 10/14/2023 PCP: Alla Amis, MD  Chief Complaint  Patient presents with   Shortness of Breath    Hospital Course:  Susan Sosa is a 83 y.o. female with medical history significant of pulmonary fibrosis idiopathic, essential hypertension, hyponatremia, hyperlipidemia, who presented to the ER with progressive shortness of breath and cough .  Patient is normally on 4 to 6 L of oxygen at home.  Presented with hypoxia now requiring 7 L [place].  Pulmonary consulted for worsening pulmonary fibrosis.  Hospital course as below  Subjective: Patient was examined at the bedside, new to me today.  2 other friends also present at the bed States shortness of breath is better since presentation, pulmonary evaluation pending Discussed CODE STATUS, patient remains full code   Objective: Vitals:   10/15/23 0214 10/15/23 0526 10/15/23 0749 10/15/23 0759  BP: 132/73 (!) 162/82  (!) 180/101  Pulse: 85 77  85  Resp: (!) 21 20  (!) 22  Temp: 98.9 F (37.2 C) 98.8 F (37.1 C)  97.7 F (36.5 C)  TempSrc:    Oral  SpO2: 94% 95% 94% 95%  Weight:       No intake or output data in the 24 hours ending 10/15/23 0801 Filed Weights   10/14/23 1941  Weight: 59.9 kg    Examination: Constitutional: Chronically ill looking, weak NAD, calm, comfortable Neck: normal, supple, no masses, no thyromegaly Respiratory: Bibasilar crackles present, no wheezing Cardiovascular: Regular rate and rhythm Abdomen: no tenderness, no masses palpated. No hepatosplenomegaly. Bowel sounds positive.  Musculoskeletal: Good range of motion, no joint swelling or tenderness, Skin: no rashes, lesions, ulcers. No induration Neurologic: Aox 3, no gross focal deficits  Assessment & Plan:  Principal Problem:   Acute on chronic respiratory failure with hypoxemia (HCC) Active Problems:   Idiopathic pulmonary fibrosis (HCC)   Benign essential hypertension    Paroxysmal atrial tachycardia (HCC)   Osteoarthritis of knee   NICM (nonischemic cardiomyopathy) (HCC)  Acute on chronic hypoxic respiratory failure Interstitial pulmonary fibrosis - Secondary to worsening pulmonary fibrosis, on 4-6L [place] at home, now at 7L [place] - Leukocytosis on admission resolved, RVP neg - CXR hypoinflation with chronic changes over the lung bases, compatible with pulmonary fibrosis - On Decadron at home - On IV Solu-Medrol , DuoNebs prn, pirfenidone  - Pulmonary consult - Titrate down oxygen as tolerated  Elevated BNP - BNP elevated, euvolemic on exam - Echo pending   Hypertension, uncontrolled - Resume clonidine , losartan  - Hydralazine  as needed with holding parameters  Paroxysmal atrial tachycardia - Rate controlled - On verapamil   Nonischemic cardiomyopathy - compensated   Chronic kidney disease stage II - Monitor Cr   DVT prophylaxis: Lovenox  SQ   Code Status: Full Code Disposition:  TBD  Consultants:  Pulmonary  Procedures:  None  Antimicrobials:  Anti-infectives (From admission, onward)    None       Data Reviewed: I have personally reviewed following labs and imaging studies CBC: Recent Labs  Lab 10/14/23 1646 10/14/23 1941 10/15/23 0706  WBC 14.3* 10.9* 10.5  NEUTROABS 11.8*  --   --   HGB 13.8 13.5 13.2  HCT 40.6 40.3 39.6  MCV 88.6 88.4 88.8  PLT 301 261 267   Basic Metabolic Panel: Recent Labs  Lab 10/14/23 1646 10/14/23 1941 10/15/23 0706  NA 137  --  139  K 4.5  --  4.3  CL 99  --  101  CO2  25  --  26  GLUCOSE 96  --  114*  BUN 26*  --  26*  CREATININE 0.87 1.18* 0.79  CALCIUM  8.7*  --  8.4*   GFR: Estimated Creatinine Clearance: 40.5 mL/min (by C-G formula based on SCr of 0.79 mg/dL). Liver Function Tests: Recent Labs  Lab 10/15/23 0706  AST 20  ALT 20  ALKPHOS 48  BILITOT 0.6  PROT 6.4*  ALBUMIN 3.3*   CBG: No results for input(s): GLUCAP in the last 168 hours.  No results found for this or  any previous visit (from the past 240 hours).   Radiology Studies: DG Chest Portable 1 View Result Date: 10/14/2023 CLINICAL DATA:  Shortness of breath. EXAM: PORTABLE CHEST 1 VIEW COMPARISON:  Chest x-ray 11/08/2022, chest CT 10/12/2023 FINDINGS: Lungs are somewhat hypoinflated with chronic changes over the lung bases compatible recent CT findings of pulmonary fibrosis. No acute airspace process. No effusion. Cardiomediastinal silhouette and remainder of the exam is unchanged. IMPRESSION: Hypoinflation with chronic changes over the lung bases compatible with recent CT findings of pulmonary fibrosis. Electronically Signed   By: Toribio Agreste M.D.   On: 10/14/2023 17:42    Scheduled Meds:  enoxaparin  (LOVENOX ) injection  40 mg Subcutaneous Q24H   ipratropium-albuterol   3 mL Nebulization QID   methylPREDNISolone  (SOLU-MEDROL ) injection  125 mg Intravenous Q12H   Continuous Infusions:   LOS: 1 day  MDM: Patient is high risk for one or more organ failure.  They necessitate ongoing hospitalization for continued IV therapies and subsequent lab monitoring. Total time spent interpreting labs and vitals, reviewing the medical record, coordinating care amongst consultants and care team members, directly assessing and discussing care with the patient and/or family: 55 min Laree Lock, MD [facility]  To contact the attending physician between 7A-7P please use Epic Chat. To contact the covering physician during after hours 7P-7A, please review Amion.  10/15/2023, 8:01 AM   *This document has been created with the assistance of dictation software. Please excuse typographical errors. *

## 2023-10-16 DIAGNOSIS — Z515 Encounter for palliative care: Secondary | ICD-10-CM

## 2023-10-16 DIAGNOSIS — I272 Pulmonary hypertension, unspecified: Secondary | ICD-10-CM

## 2023-10-16 DIAGNOSIS — J9621 Acute and chronic respiratory failure with hypoxia: Secondary | ICD-10-CM | POA: Diagnosis not present

## 2023-10-16 DIAGNOSIS — I428 Other cardiomyopathies: Secondary | ICD-10-CM

## 2023-10-16 DIAGNOSIS — J8410 Pulmonary fibrosis, unspecified: Secondary | ICD-10-CM

## 2023-10-16 LAB — BASIC METABOLIC PANEL WITH GFR
Anion gap: 13 (ref 5–15)
BUN: 26 mg/dL — ABNORMAL HIGH (ref 8–23)
CO2: 26 mmol/L (ref 22–32)
Calcium: 8.7 mg/dL — ABNORMAL LOW (ref 8.9–10.3)
Chloride: 96 mmol/L — ABNORMAL LOW (ref 98–111)
Creatinine, Ser: 0.76 mg/dL (ref 0.44–1.00)
GFR, Estimated: >60 mL/min (ref >60)
Glucose, Bld: 119 mg/dL — ABNORMAL HIGH (ref 70–99)
Potassium: 4.5 mmol/L (ref 3.5–5.1)
Sodium: 135 mmol/L (ref 135–145)

## 2023-10-16 MED ORDER — FUROSEMIDE 10 MG/ML IJ SOLN
40.0000 mg | Freq: Every day | INTRAMUSCULAR | Status: DC
  Administered 2023-10-16 – 2023-10-17 (×2): 40 mg via INTRAVENOUS
  Filled 2023-10-16 (×2): qty 4

## 2023-10-16 MED ORDER — METHYLPREDNISOLONE SODIUM SUCC 125 MG IJ SOLR
80.0000 mg | Freq: Two times a day (BID) | INTRAMUSCULAR | Status: DC
  Administered 2023-10-16: 80 mg via INTRAVENOUS
  Administered 2023-10-17: 60 mg via INTRAVENOUS
  Filled 2023-10-16 (×2): qty 2

## 2023-10-16 NOTE — Progress Notes (Addendum)
 PROGRESS NOTE    Susan Sosa  FMW:[id number] DOB: 02/21/1940 DOA: 10/14/2023 PCP: Alla Amis, MD  Chief Complaint  Patient presents with   Shortness of Breath    Hospital Course:  Susan Sosa is a 83 y.o. female with medical history significant of pulmonary fibrosis idiopathic, essential hypertension, hyponatremia, hyperlipidemia, who presented to the ER with progressive shortness of breath and cough .  Patient is normally on 4 to 6 L of oxygen at home.  Presented with hypoxia now requiring 7 L [place].  Pulmonary consulted for worsening pulmonary fibrosis.  Hospital course as below  Subjective: Patient was examined at the bedside, spouse was present at the bedside. States she feels better with her breathing, on 6 L [place] Seen by palliative, CODE STATUS changed to DNR/DNI  Objective: Vitals:   10/15/23 1946 10/16/23 0343 10/16/23 0736 10/16/23 1000  BP: (!) 165/91 (!) 147/81  125/64  Pulse: 83 76  93  Resp: 20 20  16   Temp:  97.9 F (36.6 C)  97.7 F (36.5 C)  TempSrc:    Oral  SpO2: 94% 96% 94% 91%  Weight:        Intake/Output Summary (Last 24 hours) at 10/16/2023 1733 Last data filed at 10/16/2023 1300 Gross per 24 hour  Intake 1200 ml  Output --  Net 1200 ml   Filed Weights   10/14/23 1941  Weight: 59.9 kg    Examination: Constitutional: Chronically ill looking, weak NAD, calm, comfortable Neck: normal, supple, no masses, no thyromegaly Respiratory: Bibasilar crackles present, no wheezing Cardiovascular: Regular rate and rhythm Abdomen: no tenderness, no masses palpated. No hepatosplenomegaly. Bowel sounds positive.  Musculoskeletal: Good range of motion, no joint swelling or tenderness, Skin: no rashes, lesions, ulcers. No induration Neurologic: Aox 3, no gross focal deficits  Assessment & Plan:  Principal Problem:   Acute on chronic respiratory failure with hypoxemia (HCC) Active Problems:   Idiopathic pulmonary fibrosis (HCC)   Benign essential  hypertension   Paroxysmal atrial tachycardia (HCC)   Osteoarthritis of knee   NICM (nonischemic cardiomyopathy) (HCC)  Acute on chronic hypoxic respiratory failure Interstitial pulmonary fibrosis - Secondary to worsening pulmonary fibrosis, on 4-6L [place] at home, now at 6L [place] - Leukocytosis on admission resolved, RVP neg - CXR hypoinflation with chronic changes over the lung bases, compatible with pulmonary fibrosis - On Decadron at home - On IV Solu-Medrol  80mg  bid, DuoNebs prn, pirfenidone  - Seen by pulmonary and palliative, appreciate recs.  Follow-up outpatient - Chest PT - Titrate down oxygen as tolerated  Elevated BNP - BNP elevated, euvolemic on exam - Echo pending   Hypertension, uncontrolled - On clonidine , losartan  - Hydralazine  as needed with holding parameters  Paroxysmal atrial tachycardia - Rate controlled - On verapamil   Nonischemic cardiomyopathy - compensated   Chronic kidney disease stage II - Monitor Cr  --PT/OT eval Code status changed to DNR/DNI   DVT prophylaxis: Lovenox  SQ   Code Status: Limited: Do not attempt resuscitation (DNR) -DNR-LIMITED -Do Not Intubate/DNI  Disposition:  TBD  Consultants:  Pulmonary  Procedures:  None  Antimicrobials:  Anti-infectives (From admission, onward)    None       Data Reviewed: I have personally reviewed following labs and imaging studies CBC: Recent Labs  Lab 10/14/23 1646 10/14/23 1941 10/15/23 0706  WBC 14.3* 10.9* 10.5  NEUTROABS 11.8*  --   --   HGB 13.8 13.5 13.2  HCT 40.6 40.3 39.6  MCV 88.6 88.4 88.8  PLT 301 261 267  Basic Metabolic Panel: Recent Labs  Lab 10/14/23 1646 10/14/23 1941 10/15/23 0706 10/16/23 0613  NA 137  --  139 135  K 4.5  --  4.3 4.5  CL 99  --  101 96*  CO2 25  --  26 26  GLUCOSE 96  --  114* 119*  BUN 26*  --  26* 26*  CREATININE 0.87 1.18* 0.79 0.76  CALCIUM  8.7*  --  8.4* 8.7*   GFR: Estimated Creatinine Clearance: 40.5 mL/min (by C-G formula  based on SCr of 0.76 mg/dL). Liver Function Tests: Recent Labs  Lab 10/15/23 0706  AST 20  ALT 20  ALKPHOS 48  BILITOT 0.6  PROT 6.4*  ALBUMIN 3.3*   CBG: No results for input(s): GLUCAP in the last 168 hours.  Recent Results (from the past 240 hours)  SARS Coronavirus 2 by RT PCR (hospital order, performed in [facility] hospital lab) *cepheid single result test* Anterior Nasal Swab     Status: None   Collection Time: 10/15/23 10:11 AM   Specimen: Anterior Nasal Swab  Result Value Ref Range Status   SARS Coronavirus 2 by RT PCR NEGATIVE NEGATIVE Final    Comment: (NOTE) SARS-CoV-2 target nucleic acids are NOT DETECTED.  The SARS-CoV-2 RNA is generally detectable in upper and lower respiratory specimens during the acute phase of infection. The lowest concentration of SARS-CoV-2 viral copies this assay can detect is 250 copies / mL. A negative result does not preclude SARS-CoV-2 infection and should not be used as the sole basis for treatment or other patient management decisions.  A negative result may occur with improper specimen collection / handling, submission of specimen other than nasopharyngeal swab, presence of viral mutation(s) within the areas targeted by this assay, and inadequate number of viral copies (<250 copies / mL). A negative result must be combined with clinical observations, patient history, and epidemiological information.  Fact Sheet for Patients:   RoadLapTop.co.za  Fact Sheet for Healthcare Providers: http://kim-miller.com/  This test is not yet approved or  cleared by the [place]  FDA and has been authorized for detection and/or diagnosis of SARS-CoV-2 by FDA under an Emergency Use Authorization (EUA).  This EUA will remain in effect (meaning this test can be used) for the duration of the COVID-19 declaration under Section 564(b)(1) of the Act, 21 U.S.C. section 360bbb-3(b)(1), unless the  authorization is terminated or revoked sooner.  Performed at [facility], [address] [place], [place] [address]   Respiratory (~20 pathogens) panel by PCR     Status: None   Collection Time: 10/15/23 10:11 AM   Specimen: Nasopharyngeal Swab; Respiratory  Result Value Ref Range Status   Adenovirus NOT DETECTED NOT DETECTED Final   Coronavirus 229E NOT DETECTED NOT DETECTED Final    Comment: (NOTE) The Coronavirus on the Respiratory Panel, DOES NOT test for the novel  Coronavirus (2019 nCoV)    Coronavirus HKU1 NOT DETECTED NOT DETECTED Final   Coronavirus NL63 NOT DETECTED NOT DETECTED Final   Coronavirus OC43 NOT DETECTED NOT DETECTED Final   Metapneumovirus NOT DETECTED NOT DETECTED Final   Rhinovirus / Enterovirus NOT DETECTED NOT DETECTED Final   Influenza A NOT DETECTED NOT DETECTED Final   Influenza B NOT DETECTED NOT DETECTED Final   Parainfluenza Virus 1 NOT DETECTED NOT DETECTED Final   Parainfluenza Virus 2 NOT DETECTED NOT DETECTED Final   Parainfluenza Virus 3 NOT DETECTED NOT DETECTED Final   Parainfluenza Virus 4 NOT DETECTED NOT DETECTED  Final   Respiratory Syncytial Virus NOT DETECTED NOT DETECTED Final   Bordetella pertussis NOT DETECTED NOT DETECTED Final   Bordetella Parapertussis NOT DETECTED NOT DETECTED Final   Chlamydophila pneumoniae NOT DETECTED NOT DETECTED Final   Mycoplasma pneumoniae NOT DETECTED NOT DETECTED Final    Comment: Performed at [facility], [address], [place], [place] [address]     Radiology Studies: DG Chest Portable 1 View Result Date: 10/14/2023 CLINICAL DATA:  Shortness of breath. EXAM: PORTABLE CHEST 1 VIEW COMPARISON:  Chest x-ray 11/08/2022, chest CT 10/12/2023 FINDINGS: Lungs are somewhat hypoinflated with chronic changes over the lung bases compatible recent CT findings of pulmonary fibrosis. No acute airspace process. No effusion. Cardiomediastinal silhouette and remainder of the exam is unchanged.  IMPRESSION: Hypoinflation with chronic changes over the lung bases compatible with recent CT findings of pulmonary fibrosis. Electronically Signed   By: Toribio Agreste M.D.   On: 10/14/2023 17:42    Scheduled Meds:  acetylcysteine   4 mL Nebulization TID   aspirin   81 mg Oral QPM   atorvastatin   40 mg Oral Daily   cloNIDine   0.1 mg Transdermal Weekly   enoxaparin  (LOVENOX ) injection  40 mg Subcutaneous Q24H   furosemide   40 mg Intravenous Daily   ipratropium-albuterol   3 mL Nebulization TID   losartan   25 mg Oral Daily   methylPREDNISolone  (SOLU-MEDROL ) injection  80 mg Intravenous Q12H   pantoprazole   40 mg Oral QHS   Pirfenidone   534 mg Oral TID with meals   verapamil   180 mg Oral QHS   Continuous Infusions:   LOS: 2 days  MDM: Patient is high risk for one or more organ failure.  They necessitate ongoing hospitalization for continued IV therapies and subsequent lab monitoring. Total time spent interpreting labs and vitals, reviewing the medical record, coordinating care amongst consultants and care team members, directly assessing and discussing care with the patient and/or family: 55 min Laree Lock, MD [facility]  To contact the attending physician between 7A-7P please use Epic Chat. To contact the covering physician during after hours 7P-7A, please review Amion.  10/16/2023, 5:33 PM   *This document has been created with the assistance of dictation software. Please excuse typographical errors. *

## 2023-10-16 NOTE — Consult Note (Signed)
 Consultation Note Date: 10/16/2023   Patient Name: Susan Sosa  DOB: [birth year]  MRN: [id number]  Age / Sex: 83 y.o., female  PCP: Alla Amis, MD Referring Physician: Jerelene Critchley, MD  Reason for Consultation: Establishing goals of care   HPI/Brief Hospital Course: 83 y.o. female  with past medical history of idiopathic pulmonary fibrosis, chronic hypoxic respiratory failure--wears 6L [place] at baseline, pulmonary hypertension, hypothyroidism, HLD, CKD stage II and nonischemic cardiomyopathy admitted from home on 10/14/2023 with worsening shortness of breath. Noted that Susan Sosa was seen by her pulmonologist 10/11/2023 who at that time recommended hospitalization but Susan Sosa declined. Sent home with decadron and Levaquin--per [name], symptoms improved somewhat but then worsened significantly, prompting her visit to ER.  Recent imaging--CT scan, revealing worsening of interstitial pulmonary fibrosis--no acute infectious process  Admitted and being treated for acute on chronic hypoxemic respiratory failure secondary to progression of IPF, echo pending--assessing PH  Palliative medicine was consulted for assisting with goals of care conversations.  Subjective:  Extensive chart review has been completed prior to meeting patient including labs, vital signs, imaging, progress notes, orders, and available advanced directive documents from current and previous encounters.  Visited with Susan Sosa at her bedside. She is awake, alert, and able to engage in conversation. Her husband-[name] is at bedside during time of visit.  Introduced myself as a Publishing rights manager as a member of the palliative care team. Explained palliative medicine is specialized medical care for people living with serious illness. It focuses on providing relief from the symptoms and stress of a serious illness. The goal is to improve quality of life for both the patient and the  family.   Susan Sosa shares she is feeling much better than she did a few days ago. She provides a review of recent events. Shares over the last two weeks she has become significantly more short of breath on exertion and at rest. She visited her pulmonologist who recommended hospitalization, Susan Sosa declined as she feared being exposed to COVID/influenza. Returned home and symptoms persisted and eventually worsened prompting her to come to hospital.  Susan Sosa provides a brief life review. She and Susan Sosa have been married for 5 years, dated for over 30 years. This is Susan Sosa second marriage. She does not have any biological children. She worked for [employer] system for many years prior to retiring.  Susan Sosa and Susan Sosa share that she has always been an active person. Susan Sosa was diagnosed with IPF in 2020. She felt as though her disease remained stable up until about a year ago. Up until a year ago she was able to walk on the treadmill for 20 minutes at a time each day. Over the last year, she is barely able to make it on the treadmill for 5 minutes. The last two weeks she has not been able to walk on the treadmill at all.  At baseline, she is able to remain independent with her ADL's. She does not require an assistive device for mobility. She remains in charge of cooking meals but can no longer clean the home. She requires frequent rest breaks during the day when she is up and moving around--recovers in 5-10 minutes with rest.  We discussed patient's current illness and what it means in the larger context of patient's on-going co-morbidities. Natural disease trajectory and expectations at EOL were discussed.   Susan Sosa shares she has completed an Advanced Directive in the past, thought she has provided a copy  to the hospital a few years ago. Unable to find document throughout chart. Susan Sosa shares she has appointed her husband, Susan Sosa as [place] but she is unsure of what is  outlined in her living will. She declines completing advanced directive again as she shares Susan Sosa is aware of her wishes.  Attempted to elicit goals of care. We discussed code status and the difference between Full Code and Do Not Resuscitate. Encouraged patient/family to consider DNR/DNI status understanding evidenced based poor outcomes in similar hospitalized patients, as the cause of the arrest is likely associated with chronic/terminal disease rather than a reversible acute cardio-pulmonary event.  Susan Sosa shares she has recently had conversations regarding her code status and feels at this time DNR/DNI would be best for her. Susan Sosa and Susan Sosa agree to DNR/DNI but continuing to treat the treatable to at this time.  Assessed symptoms, she denies acute pain or discomfort. She reports no recent changes in appetite or significant weight loss. No difficulty sleeping other than urinary frequency. BM about every 3-4 days, we discussed the importance of following a bowel regimen, recommended considering daily Senna as well as MiraLax .  I discussed importance of continued conversations with family/support persons and all members of their medical team regarding overall plan of care and treatment options ensuring decisions are in alignment with patients goals of care.  All questions/concerns addressed. Emotional support provided to patient/family/support persons. PMT will continue to follow and support patient as needed.  Objective: Primary Diagnoses: Present on Admission:  Idiopathic pulmonary fibrosis (HCC)  Paroxysmal atrial tachycardia (HCC)  Benign essential hypertension  Osteoarthritis of knee  Acute on chronic respiratory failure with hypoxemia (HCC)   Physical Exam Constitutional:      General: She is not in acute distress.    Appearance: She is not ill-appearing.  HENT:     Head: Normocephalic.  Pulmonary:     Effort: Pulmonary effort is normal. No respiratory distress.   Abdominal:     General: There is no distension.  Skin:    General: Skin is warm and dry.  Neurological:     Mental Status: She is alert and oriented to person, place, and time.     Motor: Weakness present.  Psychiatric:        Mood and Affect: Mood normal.        Behavior: Behavior normal.        Thought Content: Thought content normal.     Vital Signs: BP 125/64   Pulse 93   Temp 97.7 F (36.5 C) (Oral)   Resp 16   Wt 59.9 kg   SpO2 91%   BMI 26.66 kg/m  Pain Scale: 0-10   Pain Score: 0-No pain  IO: Intake/output summary:  Intake/Output Summary (Last 24 hours) at 10/16/2023 1304 Last data filed at 10/16/2023 0900 Gross per 24 hour  Intake 720 ml  Output --  Net 720 ml    LBM: Last BM Date : 10/14/23 Baseline Weight: Weight: 59.9 kg Most recent weight: Weight: 59.9 kg       Palliative Assessment/Data: 70%   Assessment and Plan  SUMMARY OF RECOMMENDATIONS   DNR/DNI Continue with current plan of care Recommend consideration of outpatient palliative at discharge  Palliative Prophylaxis:   Bowel Regimen, Delirium Protocol and Frequent Pain Assessment  Thank you for this consult and allowing Palliative Medicine to participate in the care of Adalis Gatti. Palliative medicine will continue to follow and assist as needed.   Visit includes: Detailed review  of medical records (labs, imaging, vital signs), medically appropriate exam (mental status, respiratory, cardiac, skin), discussed with treatment team, counseling and educating patient, family and staff, documenting clinical information, medication management and coordination of care.   Signed by: Waddell Lesches, DNP, AGNP-C Palliative Medicine    Please contact Palliative Medicine Team phone at [phone] for questions and concerns.  For individual provider: See Tracey

## 2023-10-16 NOTE — Plan of Care (Signed)

## 2023-10-16 NOTE — Consult Note (Signed)
 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  She at times gets respiratory infections but generally bounces back after course of abx and steroid taper.  She has also been on steroids with decadron with partial improvement. 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  Her PFT have also declined over last five years but FEV1 this year has been spared >80% with severe decrement in DLCO as excpected with PH and ILD.  She has a previous history of smoking for appx 40years and does have emphysematous changes overlying her UIP pattern of fibrosis.    PAST MEDICAL HISTORY   Past Medical History:  Diagnosis Date   Arthritis    Heart murmur    Hyperlipidemia    Hypertension    Hyponatremia    IPF (idiopathic pulmonary fibrosis) (HCC)      SURGICAL HISTORY   Past Surgical History:  Procedure Laterality Date   LEFT HEART CATH AND CORONARY ANGIOGRAPHY N/A 11/27/2021   Procedure: LEFT HEART CATH AND CORONARY ANGIOGRAPHY and possible pci and stent;  Surgeon: Florencio Cara BIRCH, MD;  Location: [facility];  Service: Cardiovascular;  Laterality: N/A;   RIGHT HEART CATH Right 08/10/2023   Procedure: RIGHT HEART CATH;   Surgeon: Florencio Cara BIRCH, MD;  Location: [facility];  Service: Cardiovascular;  Laterality: Right;     FAMILY HISTORY   History reviewed. No pertinent family history.   SOCIAL HISTORY   Social History   Tobacco Use   Smoking status: Former   Smokeless tobacco: Never   Tobacco comments:    Quit 30 years ago  Substance Use Topics   Alcohol use: Never   Drug use: Never     MEDICATIONS    Home Medication:    Current Medication:  Current Facility-Administered Medications:    acetylcysteine  (MUCOMYST ) 20 % nebulizer / oral solution 4 mL, 4 mL, Nebulization, TID, Ponnala, Shruthi, MD, 4 mL at 10/15/23 1920   aspirin  chewable tablet 81 mg, 81 mg, Oral, QPM, Ponnala, Shruthi, MD, 81 mg at 10/15/23 1848   atorvastatin  (LIPITOR) tablet 40 mg, 40 mg, Oral, Daily, Ponnala, Shruthi, MD, 40 mg at 10/15/23 1013   cloNIDine  (CATAPRES  - Dosed in mg/24 hr) patch 0.1 mg, 0.1 mg, Transdermal, Weekly, Ponnala, Shruthi, MD, 0.1 mg at 10/15/23 1012   enoxaparin  (LOVENOX ) injection 40 mg, 40 mg, Subcutaneous, Q24H, Garba, Mohammad L, MD, 40 mg at 10/15/23 2330   hydrALAZINE  (APRESOLINE ) injection 10 mg, 10 mg, Intravenous, Q6H PRN, Ponnala, Shruthi, MD   ipratropium-albuterol  (DUONEB) 0.5-2.5 (3) MG/3ML nebulizer solution 3 mL, 3 mL, Nebulization, TID, Ponnala,  Shruthi, MD, 3 mL at 10/15/23 1920   losartan  (COZAAR ) tablet 25 mg, 25 mg, Oral, Daily, Ponnala, Shruthi, MD, 25 mg at 10/15/23 1013   methylPREDNISolone  sodium succinate (SOLU-MEDROL ) 125 mg/2 mL injection 125 mg, 125 mg, Intravenous, Q12H, Sim, Mohammad L, MD, 125 mg at 10/16/23 9479   morphine  (PF) 2 MG/ML injection 2 mg, 2 mg, Intravenous, Q2H PRN, Sim, Mohammad L, MD   ondansetron  (ZOFRAN ) tablet 4 mg, 4 mg, Oral, Q6H PRN **OR** ondansetron  (ZOFRAN ) injection 4 mg, 4 mg, Intravenous, Q6H PRN, Sim, Mohammad L, MD   pantoprazole  (PROTONIX ) EC tablet 40 mg, 40 mg, Oral, QHS, Ponnala, Shruthi, MD, 40 mg at 10/15/23  2330   Pirfenidone  TABS 534 mg, 534 mg, Oral, TID with meals, Ponnala, Shruthi, MD, 534 mg at 10/15/23 1849   Treprostinil  POWD 16 mcg, 16 mcg, Inhalation, TID, Ponnala, Shruthi, MD   verapamil  (CALAN -SR) CR tablet 180 mg, 180 mg, Oral, QHS, Ponnala, Shruthi, MD, 180 mg at 10/15/23 2330    ALLERGIES   Latex, Lidocaine, Atorvastatin , Losartan , Metronidazole, and Oxycodone      REVIEW OF SYSTEMS    Review of Systems:  Gen:  Denies  fever, sweats, chills weigh loss  HEENT: Denies blurred vision, double vision, ear pain, eye pain, hearing loss, nose bleeds, sore throat Cardiac:  No dizziness, chest pain or heaviness, chest tightness,edema Resp:   reports dyspnea chronically  Gi: Denies swallowing difficulty, stomach pain, nausea or vomiting, diarrhea, constipation, bowel incontinence Gu:  Denies bladder incontinence, burning urine Ext:   Denies Joint pain, stiffness or swelling Skin: Denies  skin rash, easy bruising or bleeding or hives Endoc:  Denies polyuria, polydipsia , polyphagia or weight change Psych:   Denies depression, insomnia or hallucinations   Other:  All other systems negative   VS: BP (!) 147/81   Pulse 76   Temp 97.9 F (36.6 C)   Resp 20   Wt 59.9 kg   SpO2 96%   BMI 26.66 kg/m      PHYSICAL EXAM    GENERAL:NAD, no fevers, chills, no weakness no fatigue HEAD: Normocephalic, atraumatic.  EYES: Pupils equal, round, reactive to light. Extraocular muscles intact. No scleral icterus.  MOUTH: Moist mucosal membrane. Dentition intact. No abscess noted.  EAR, NOSE, THROAT: Clear without exudates. No external lesions.  NECK: Supple. No thyromegaly. No nodules. No JVD.  PULMONARY: decreased breath sounds with mild rhonchi worse at bases bilaterally.  CARDIOVASCULAR: S1 and S2. Regular rate and rhythm. No murmurs, rubs, or gallops. No edema. Pedal pulses 2+ bilaterally.  GASTROINTESTINAL: Soft, nontender, nondistended. No masses. Positive bowel sounds. No  hepatosplenomegaly.  MUSCULOSKELETAL: No swelling, clubbing, or edema. Range of motion full in all extremities.  NEUROLOGIC: Cranial nerves II through XII are intact. No gross focal neurological deficits. Sensation intact. Reflexes intact.  SKIN: No ulceration, lesions, rashes, or cyanosis. Skin warm and dry. Turgor intact.  PSYCHIATRIC: Mood, affect within normal limits. The patient is awake, alert and oriented x 3. Insight, judgment intact.       IMAGING   @IMAGES @   ASSESSMENT/PLAN   Acute on chronic hypoxemic respiratory failure       Currently negative for viral LRTI due to negative RVP and SARS/FLU/RSV panel      - inflammatory workup with ESR and CPR is flat which points away from acute exacerbation of ILD although she is on steroids with solumedrol 125 IV.     - Her CT scan did not show much interstitial edema or  effusion however did have elevated BNP suggestive of acute on chronic CHF. An echo has been ordered to evaluate interval changes with cardiac function. She follows up with Dr Florencio [facility] cardiology.     Chronic Idiopathic pulmonary fibrosis    - advanced and progressed over past 5 years    - will continue Esbrient home dosing as current    Centrilobular emphysema -no signs of acute exacerbation of COPD  - viral workup and inflammatory biomarkers are negative   - duoneb prn is sufficient at this time   -reduced solumedrol to 80 IV bid   Pulmonary hypertension associated with pulmonary fibrosis    - will dc tyvasso DPI while hospitalized    -PT/OT ordered   Bibasilar atelectasis   - recruitement maneuvers via chest physiotherapy utilizing metaneb with duoneb     - PT /OT  Physical deconditioning   Continue PT/OT , continue chest PT  BODE score >8 with poor long term prognosis   Goals of care     - patient is currently full code status, my recommendation is to proceed with DNR and consider outpatient palliative.  Consultation for palliative care  evaluation has been placed      Thank you for allowing me to participate in the care of this patient.   Patient/Family are satisfied with care plan and all questions have been answered.    Provider disclosure: Patient with at least one acute or chronic illness or injury that poses a threat to life or bodily function and is being managed actively during this encounter.  All of the below services have been performed independently by signing provider:  review of prior documentation from internal and or external health records.  Review of previous and current lab results.  Interview and comprehensive assessment during patient visit today. Review of current and previous chest radiographs/CT scans. Discussion of management and test interpretation with health care team and patient/family.   This document was prepared using Dragon voice recognition software and may include unintentional dictation errors.     Susane Bey, M.D.  Division of Pulmonary & Critical Care Medicine

## 2023-10-16 NOTE — Evaluation (Signed)
 Occupational Therapy Evaluation Patient Details Name: Susan Sosa MRN: [id number] DOB: 1940-11-01 Today's Date: 10/16/2023   History of Present Illness   Susan Sosa is a 83 y.o. female with medical history significant of pulmonary fibrosis idiopathic, essential hypertension, hyponatremia, hyperlipidemia, who presented to the ER with progressive shortness of breath and cough .  Patient is normally on 4 to 6 L of oxygen at home.  Presented with hypoxia now requiring 7 L [place].  Pulmonary consulted for worsening pulmonary fibrosis.     Clinical Impressions Pt was seen for OT evaluation this date. Prior to hospital admission, pt was modified independent completing all ADLs, completed IADL with her husband who does most of the driving. Pt lives with her husband in one level home with 4 steps to enter. Pt presents with deficits in decreased indep in self care,balance, functional mobility/transfers, activity tolerance, and safety awareness affecting safe and optimal ADL completion. Pt currently completed bed mobility with MODI, dons bilateral shoes with setupA in sitting and STS from lowest bed height with supervision. Pt amb within the hallway ~39ft x2 requiring one seated break to recover, CGA throughout with oxygen line management, MINA verbal cues for safety throughout. On 6L on arrival to room via Cataract spo2 90% at rest. During mobility desat spo2 72% at the lowest, takes ~4mins to recover back to spo2 90% when bumped up to 7L (RN aware) with pursed lip breathing. Pt educated on energy conservation techniques and fall prevention strategies, pt verbalized understanding. Pt would benefit from skilled OT services to address noted impairments and functional limitations (see below for any additional details) in order to maximize safety and independence while minimizing future risk of falls, injury, and readmission. OT will follow acutely    If plan is discharge home, recommend the following:   A little help  with bathing/dressing/bathroom;Assist for transportation     Functional Status Assessment   Patient has had a recent decline in their functional status and demonstrates the ability to make significant improvements in function in a reasonable and predictable amount of time.     Equipment Recommendations   None recommended by OT     Recommendations for Other Services         Precautions/Restrictions   Precautions Precautions: Fall Recall of Precautions/Restrictions: Intact Restrictions Weight Bearing Restrictions Per Provider Order: No     Mobility Bed Mobility Overal bed mobility: Modified Independent                  Transfers Overall transfer level: Needs assistance Equipment used: None Transfers: Sit to/from Stand Sit to Stand: Contact guard assist, Supervision           General transfer comment: STS from EOB CGA progressing to supervision, amb within hallway ~ 38ft x2 with one seated break to recover CGA throughout with oxygen line management      Balance Overall balance assessment: Needs assistance Sitting-balance support: Feet supported, No upper extremity supported Sitting balance-Leahy Scale: Normal Sitting balance - Comments: Steady reach outside BOS   Standing balance support: No upper extremity supported, During functional activity Standing balance-Leahy Scale: Good                             ADL either performed or assessed with clinical judgement   ADL Overall ADL's : Needs assistance/impaired     Grooming: Standing;Wash/dry hands               Lower  Body Dressing: Sit to/from stand;SetupA   Toilet Transfer: Contact guard Marine scientist Details (indicate cue type and reason): simulated t/f         Functional mobility during ADLs: Contact guard assist General ADL Comments: CGA standing ADL tasks for safety, no DME use     Vision Baseline Vision/History: 1 Wears glasses                          Pertinent Vitals/Pain Pain Assessment Pain Assessment: No/denies pain     Extremity/Trunk Assessment Upper Extremity Assessment Upper Extremity Assessment: Overall WFL for tasks assessed   Lower Extremity Assessment Lower Extremity Assessment: Defer to PT evaluation   Cervical / Trunk Assessment Cervical / Trunk Assessment: Normal   Communication Communication Communication: No apparent difficulties   Cognition Arousal: Alert Behavior During Therapy: WFL for tasks assessed/performed Cognition: No apparent impairments             OT - Cognition Comments: A/Ox4                 Following commands: Intact       Cueing  General Comments   Cueing Techniques: Verbal cues  On 6L on arrival to room via Rew spo2 90% at rest. During mobility desat 72% at the lowest, takes ~63mins to recover back to 90% when bumped up to 7L (RN aware) with pursed lip breathing   Exercises Exercises: Other exercises Other Exercises Other Exercises: Edu: Role of OT eval, energy conservation techniques, safe ADL completion   Shoulder Instructions      Home Living Family/patient expects to be discharged to:: Private residence Living Arrangements: Spouse/significant other Available Help at Discharge: Family;Available 24 hours/day Type of Home: House Home Access: Stairs to enter [employer] of Steps: 4 Entrance Stairs-Rails: Left Home Layout: One level     Bathroom Shower/Tub: Producer, television/film/video: Handicapped height     Home Equipment: Grab bars - tub/shower;Grab bars - toilet;Shower seat - built in          Prior Functioning/Environment Prior Level of Function : Independent/Modified Independent;Driving             Mobility Comments: Indep with no AD, within the last 4 month pt reports limited physical activity compared to her normal on the treadmill, currently can only tolerate or less due to feelings of SOB. ADLs  Comments: IND for ADls/IADLs, minimal driving    OT Problem List: Decreased strength;Decreased activity tolerance;Decreased coordination;Decreased safety awareness;Decreased knowledge of use of DME or AE;Decreased knowledge of precautions   OT Treatment/Interventions: Self-care/ADL training;Therapeutic exercise;Energy conservation;DME and/or AE instruction;Therapeutic activities;Patient/family education;Balance training      OT Goals(Current goals can be found in the care plan section)   Acute Rehab OT Goals Patient Stated Goal: Improve activity tolerance OT Goal Formulation: With patient Time For Goal Achievement: 10/30/23 Potential to Achieve Goals: Good ADL Goals Pt Will Perform Grooming: with modified independence;standing Pt Will Perform Lower Body Dressing: with modified independence;sit to/from stand Pt Will Transfer to Toilet: with modified independence;ambulating Pt Will Perform Toileting - Clothing Manipulation and hygiene: with modified independence;sit to/from stand   OT Frequency:  Min 1X/week    Co-evaluation              AM-PAC OT 6 Clicks Daily Activity     Outcome Measure Help from another person eating meals?: None Help from another person taking care of personal grooming?: None Help from  another person toileting, which includes using toliet, bedpan, or urinal?: A Little Help from another person bathing (including washing, rinsing, drying)?: A Little Help from another person to put on and taking off regular upper body clothing?: None Help from another person to put on and taking off regular lower body clothing?: None 6 Click Score: 22   End of Session Equipment Utilized During Treatment: Gait belt;Oxygen Nurse Communication: Mobility status;Other (comment) (oxygen needs)  Activity Tolerance: Patient tolerated treatment well Patient left: in bed;with call bell/phone within reach;with family/visitor present  OT Visit Diagnosis: Unsteadiness on feet  (R26.81);Other abnormalities of gait and mobility (R26.89);Muscle weakness (generalized) (M62.81)                Time: 1123-1150 OT Time Calculation (min): 27 min Charges:  OT General Charges $OT Visit: 1 Visit OT Evaluation $OT Eval Low Complexity: 1 Low OT Treatments $Self Care/Home Management : 8-22 mins  Larraine Colas M.S. OTR/L  10/16/23, 1:41 PM

## 2023-10-17 ENCOUNTER — Inpatient Hospital Stay
Admit: 2023-10-17 | Discharge: 2023-10-17 | Disposition: A | Discharge status: Home / Self Care | Attending: Pulmonary Disease | Admitting: Pulmonary Disease

## 2023-10-17 DIAGNOSIS — J9621 Acute and chronic respiratory failure with hypoxia: Secondary | ICD-10-CM | POA: Diagnosis not present

## 2023-10-17 LAB — ECHOCARDIOGRAM COMPLETE
AR max vel: 1.48 cm2
AV Area VTI: 1.45 cm2
AV Area mean vel: 1.45 cm2
AV Mean grad: 2 mmHg
AV Peak grad: 3.3 mmHg
Ao pk vel: 0.91 m/s
Area-P 1/2: 3.42 cm2
Calc EF: 58.4 %
MV VTI: 1.43 cm2
S' Lateral: 1.9 cm
Single Plane A2C EF: 61 %
Single Plane A4C EF: 55 %
Weight: 2112 [oz_av]

## 2023-10-17 MED ORDER — METHYLPREDNISOLONE SODIUM SUCC 125 MG IJ SOLR
60.0000 mg | Freq: Two times a day (BID) | INTRAMUSCULAR | Status: DC
  Administered 2023-10-17 – 2023-10-18 (×2): 60 mg via INTRAVENOUS
  Filled 2023-10-17 (×2): qty 2

## 2023-10-17 NOTE — Progress Notes (Signed)
 PROGRESS NOTE    Susan Sosa  FMW:[id number] DOB: Dec 22, 1940 DOA: 10/14/2023 PCP: Alla Amis, MD  Chief Complaint  Patient presents with   Shortness of Breath    Hospital Course:  Susan Sosa is a 83 y.o. female with medical history significant of pulmonary fibrosis idiopathic, essential hypertension, hyponatremia, hyperlipidemia, who presented to the ER with progressive shortness of breath and cough .  Patient is normally on 4 to 6 L of oxygen at home.  Presented with hypoxia now requiring 7 L [place].  Pulmonary consulted for worsening pulmonary fibrosis.  Hospital course as below  Subjective: Patient was examined at the bedside, spouse was present at the bedside. States she feels better with her breathing, on 6 L [place] Seen by palliative, CODE STATUS changed to DNR/DNI  Objective: Vitals:   10/17/23 0800 10/17/23 1429 10/17/23 1537 10/17/23 1552  BP: 130/74  126/74   Pulse: 75  86   Resp: 17  17   Temp: 97.8 F (36.6 C)  97.6 F (36.4 C)   TempSrc: Oral  Oral   SpO2: 95% 91% 92% (!) 70%  Weight:        Intake/Output Summary (Last 24 hours) at 10/17/2023 1616 Last data filed at 10/17/2023 1358 Gross per 24 hour  Intake 480 ml  Output --  Net 480 ml   Filed Weights   10/14/23 1941  Weight: 59.9 kg    Examination: Constitutional: Chronically ill looking, weak NAD, calm, comfortable Neck: normal, supple, no masses, no thyromegaly Respiratory: Bibasilar crackles improving, no wheezing Cardiovascular: Regular rate and rhythm Abdomen: no tenderness, no masses palpated. No hepatosplenomegaly. Bowel sounds positive.  Musculoskeletal: Good range of motion, no joint swelling or tenderness, Skin: no rashes, lesions, ulcers. No induration Neurologic: Aox 3, no gross focal deficits  Assessment & Plan:  Principal Problem:   Acute on chronic respiratory failure with hypoxemia (HCC) Active Problems:   Idiopathic pulmonary fibrosis (HCC)   Benign essential hypertension    Paroxysmal atrial tachycardia   Osteoarthritis of knee   NICM (nonischemic cardiomyopathy) (HCC)  Acute on chronic hypoxic respiratory failure Interstitial pulmonary fibrosis - Secondary to worsening pulmonary fibrosis, on 4-6L [place] at home, now at 6L [place] - Leukocytosis on admission resolved, RVP neg - CXR hypoinflation with chronic changes over the lung bases, compatible with pulmonary fibrosis - On Decadron at home - Dec IV Solu-Medrol  60mg  bid, DuoNebs prn, pirfenidone  - Seen by pulmonary and palliative, appreciate recs.  Follow-up outpatient - Chest PT - Titrate down oxygen as tolerated  Acute on chronic CHF - BNP elevated, euvolemic on exam - Echo shows EF 55 to 60%, no RWMA.  Grade I diastolic dysfunction.  Trivial MR. - On IV Lasix  40mg  daily   Hypertension, uncontrolled - On clonidine , losartan  - Hydralazine  as needed with holding parameters  Paroxysmal atrial tachycardia - Rate controlled - On verapamil   Nonischemic cardiomyopathy - compensated   Chronic kidney disease stage II - Monitor Cr  --PT/OT rec Home PT/OT Code status changed to DNR/DNI   DVT prophylaxis: Lovenox  SQ   Code Status: Limited: Do not attempt resuscitation (DNR) -DNR-LIMITED -Do Not Intubate/DNI  Disposition:  TBD  Consultants:  Treatment Team:  Consulting Physician: Parris Manna, MDPulmonary  Procedures:  None  Antimicrobials:  Anti-infectives (From admission, onward)    None       Data Reviewed: I have personally reviewed following labs and imaging studies CBC: Recent Labs  Lab 10/14/23 1646 10/14/23 1941 10/15/23 0706  WBC 14.3* 10.9* 10.5  NEUTROABS 11.8*  --   --   HGB 13.8 13.5 13.2  HCT 40.6 40.3 39.6  MCV 88.6 88.4 88.8  PLT 301 261 267   Basic Metabolic Panel: Recent Labs  Lab 10/14/23 1646 10/14/23 1941 10/15/23 0706 10/16/23 0613  NA 137  --  139 135  K 4.5  --  4.3 4.5  CL 99  --  101 96*  CO2 25  --  26 26  GLUCOSE 96  --  114* 119*  BUN 26*   --  26* 26*  CREATININE 0.87 1.18* 0.79 0.76  CALCIUM  8.7*  --  8.4* 8.7*   GFR: Estimated Creatinine Clearance: 40.5 mL/min (by C-G formula based on SCr of 0.76 mg/dL). Liver Function Tests: Recent Labs  Lab 10/15/23 0706  AST 20  ALT 20  ALKPHOS 48  BILITOT 0.6  PROT 6.4*  ALBUMIN 3.3*   CBG: No results for input(s): GLUCAP in the last 168 hours.  Recent Results (from the past 240 hours)  SARS Coronavirus 2 by RT PCR (hospital order, performed in [facility] hospital lab) *cepheid single result test* Anterior Nasal Swab     Status: None   Collection Time: 10/15/23 10:11 AM   Specimen: Anterior Nasal Swab  Result Value Ref Range Status   SARS Coronavirus 2 by RT PCR NEGATIVE NEGATIVE Final    Comment: (NOTE) SARS-CoV-2 target nucleic acids are NOT DETECTED.  The SARS-CoV-2 RNA is generally detectable in upper and lower respiratory specimens during the acute phase of infection. The lowest concentration of SARS-CoV-2 viral copies this assay can detect is 250 copies / mL. A negative result does not preclude SARS-CoV-2 infection and should not be used as the sole basis for treatment or other patient management decisions.  A negative result may occur with improper specimen collection / handling, submission of specimen other than nasopharyngeal swab, presence of viral mutation(s) within the areas targeted by this assay, and inadequate number of viral copies (<250 copies / mL). A negative result must be combined with clinical observations, patient history, and epidemiological information.  Fact Sheet for Patients:   RoadLapTop.co.za  Fact Sheet for Healthcare Providers: http://kim-miller.com/  This test is not yet approved or  cleared by the [place]  FDA and has been authorized for detection and/or diagnosis of SARS-CoV-2 by FDA under an Emergency Use Authorization (EUA).  This EUA will remain in effect (meaning this test  can be used) for the duration of the COVID-19 declaration under Section 564(b)(1) of the Act, 21 U.S.C. section 360bbb-3(b)(1), unless the authorization is terminated or revoked sooner.  Performed at [facility], [address] [place], [place] [address]   Respiratory (~20 pathogens) panel by PCR     Status: None   Collection Time: 10/15/23 10:11 AM   Specimen: Nasopharyngeal Swab; Respiratory  Result Value Ref Range Status   Adenovirus NOT DETECTED NOT DETECTED Final   Coronavirus 229E NOT DETECTED NOT DETECTED Final    Comment: (NOTE) The Coronavirus on the Respiratory Panel, DOES NOT test for the novel  Coronavirus (2019 nCoV)    Coronavirus HKU1 NOT DETECTED NOT DETECTED Final   Coronavirus NL63 NOT DETECTED NOT DETECTED Final   Coronavirus OC43 NOT DETECTED NOT DETECTED Final   Metapneumovirus NOT DETECTED NOT DETECTED Final   Rhinovirus / Enterovirus NOT DETECTED NOT DETECTED Final   Influenza A NOT DETECTED NOT DETECTED Final   Influenza B NOT DETECTED NOT DETECTED Final   Parainfluenza Virus 1 NOT DETECTED NOT DETECTED  Final   Parainfluenza Virus 2 NOT DETECTED NOT DETECTED Final   Parainfluenza Virus 3 NOT DETECTED NOT DETECTED Final   Parainfluenza Virus 4 NOT DETECTED NOT DETECTED Final   Respiratory Syncytial Virus NOT DETECTED NOT DETECTED Final   Bordetella pertussis NOT DETECTED NOT DETECTED Final   Bordetella Parapertussis NOT DETECTED NOT DETECTED Final   Chlamydophila pneumoniae NOT DETECTED NOT DETECTED Final   Mycoplasma pneumoniae NOT DETECTED NOT DETECTED Final    Comment: Performed at [facility], [address] [place], [place] [address]     Radiology Studies: ECHOCARDIOGRAM COMPLETE Result Date: 10/17/2023    ECHOCARDIOGRAM REPORT   Patient Name:   KEALY LEWTER Date of Exam: 10/17/2023 Medical Rec #:  [id number]    Height:       59.0 in Accession #:    [id number]   Weight:       132.0 lb Date of Birth:  1940/06/27     BSA:          1.546  m Patient Age:    83 years     BP:           140/76 mmHg Patient Gender: F            HR:           60 bpm. Exam Location:  [facility] Procedure: 2D Echo, Cardiac Doppler and Color Doppler (Both Spectral and Color            Flow Doppler were utilized during procedure). Indications:     Cardiomyopathy-Ischemic I25.5                  Congestive Heart Failure I50.9  History:         Patient has prior history of Echocardiogram examinations, most                  recent 11/10/2022.  Sonographer:     Ashley McNeely-Sloane Referring Phys:  [id number] FUAD ALESKEROV Diagnosing Phys: Keller Paterson IMPRESSIONS  1. Left ventricular ejection fraction, by estimation, is 55 to 60%. The left ventricle has normal function. The left ventricle has no regional wall motion abnormalities. There is mild left ventricular hypertrophy. Left ventricular diastolic parameters are consistent with Grade I diastolic dysfunction (impaired relaxation).  2. Right ventricular systolic function is mildly reduced. The right ventricular size is mildly enlarged.  3. The mitral valve is normal in structure. Trivial mitral valve regurgitation.  4. The aortic valve is tricuspid. Aortic valve regurgitation is not visualized.  5. The inferior vena cava is normal in size with greater than 50% respiratory variability, suggesting right atrial pressure of 3 mmHg. FINDINGS  Left Ventricle: Left ventricular ejection fraction, by estimation, is 55 to 60%. The left ventricle has normal function. The left ventricle has no regional wall motion abnormalities. The left ventricular internal cavity size was normal in size. There is  mild left ventricular hypertrophy. Left ventricular diastolic parameters are consistent with Grade I diastolic dysfunction (impaired relaxation). Right Ventricle: The right ventricular size is mildly enlarged. No increase in right ventricular wall thickness. Right ventricular systolic function is mildly reduced. Left Atrium: Left atrial size was  normal in size. Right Atrium: Right atrial size was normal in size. Pericardium: There is no evidence of pericardial effusion. Mitral Valve: The mitral valve is normal in structure. Mild mitral annular calcification. Trivial mitral valve regurgitation. MV peak gradient, 5.7 mmHg. The mean mitral valve gradient is 2.0 mmHg. Tricuspid Valve: The tricuspid  valve is normal in structure. Tricuspid valve regurgitation is trivial. Aortic Valve: The aortic valve is tricuspid. Aortic valve regurgitation is not visualized. Aortic valve mean gradient measures 2.0 mmHg. Aortic valve peak gradient measures 3.3 mmHg. Aortic valve area, by VTI measures 1.45 cm. Pulmonic Valve: The pulmonic valve was not well visualized. Pulmonic valve regurgitation is not visualized. Aorta: The aortic root and ascending aorta are structurally normal, with no evidence of dilitation. Venous: The inferior vena cava is normal in size with greater than 50% respiratory variability, suggesting right atrial pressure of 3 mmHg. IAS/Shunts: The atrial septum is grossly normal.  LEFT VENTRICLE PLAX 2D LVIDd:         3.50 cm     Diastology LVIDs:         1.90 cm     LV e' medial:    4.79 cm/s LV PW:         1.10 cm     LV E/e' medial:  10.7 LV IVS:        1.00 cm     LV e' lateral:   7.07 cm/s LVOT diam:     1.60 cm     LV E/e' lateral: 7.3 LV SV:         29 LV SV Index:   19 LVOT Area:     2.01 cm  LV Volumes (MOD) LV vol d, MOD A2C: 38.5 ml LV vol d, MOD A4C: 57.6 ml LV vol s, MOD A2C: 15.0 ml LV vol s, MOD A4C: 25.9 ml LV SV MOD A2C:     23.5 ml LV SV MOD A4C:     57.6 ml LV SV MOD BP:      28.7 ml RIGHT VENTRICLE RV Basal diam:  3.30 cm RV Mid diam:    3.85 cm RV S prime:     11.10 cm/s TAPSE (M-mode): 1.6 cm LEFT ATRIUM             Index        RIGHT ATRIUM          Index LA diam:        3.60 cm 2.33 cm/m   RA Area:     9.81 cm LA Vol (A2C):   25.6 ml 16.56 ml/m  RA Volume:   17.50 ml 11.32 ml/m LA Vol (A4C):   30.6 ml 19.80 ml/m LA Biplane Vol:  29.3 ml 18.95 ml/m  AORTIC VALVE                    PULMONIC VALVE AV Area (Vmax):    1.48 cm     PV Vmax:        1.37 m/s AV Area (Vmean):   1.45 cm     PV Vmean:       89.800 cm/s AV Area (VTI):     1.45 cm     PV VTI:         0.274 m AV Vmax:           90.80 cm/s   PV Peak grad:   7.5 mmHg AV Vmean:          63.350 cm/s  PV Mean grad:   4.0 mmHg AV VTI:            0.200 m      RVOT Peak grad: 6 mmHg AV Peak Grad:      3.3 mmHg AV Mean Grad:      2.0 mmHg LVOT Vmax:  66.80 cm/s LVOT Vmean:        45.550 cm/s LVOT VTI:          0.144 m LVOT/AV VTI ratio: 0.72  AORTA Ao Root diam: 3.00 cm Ao Asc diam:  2.40 cm MITRAL VALVE MV Area (PHT): 3.42 cm     SHUNTS MV Area VTI:   1.43 cm     Systemic VTI:  0.14 m MV Peak grad:  5.7 mmHg     Systemic Diam: 1.60 cm MV Mean grad:  2.0 mmHg     Pulmonic VTI:  0.244 m MV Vmax:       1.19 m/s MV Vmean:      57.8 cm/s MV Decel Time: 222 msec MV E velocity: 51.40 cm/s MV A velocity: 104.00 cm/s MV E/A ratio:  0.49 Keller Alluri Electronically signed by Keller Paterson Signature Date/Time: 10/17/2023/12:38:03 PM    Final     Scheduled Meds:  acetylcysteine   4 mL Nebulization TID   aspirin   81 mg Oral QPM   atorvastatin   40 mg Oral Daily   cloNIDine   0.1 mg Transdermal Weekly   enoxaparin  (LOVENOX ) injection  40 mg Subcutaneous Q24H   furosemide   40 mg Intravenous Daily   ipratropium-albuterol   3 mL Nebulization TID   losartan   25 mg Oral Daily   methylPREDNISolone  (SOLU-MEDROL ) injection  60 mg Intravenous Q12H   pantoprazole   40 mg Oral QHS   Pirfenidone   534 mg Oral TID with meals   verapamil   180 mg Oral QHS   Continuous Infusions:   LOS: 3 days  MDM: Patient is high risk for one or more organ failure.  They necessitate ongoing hospitalization for continued IV therapies and subsequent lab monitoring. Total time spent interpreting labs and vitals, reviewing the medical record, coordinating care amongst consultants and care team members, directly  assessing and discussing care with the patient and/or family: 55 min Laree Lock, MD [facility]  To contact the attending physician between 7A-7P please use Epic Chat. To contact the covering physician during after hours 7P-7A, please review Amion.  10/17/2023, 4:16 PM   *This document has been created with the assistance of dictation software. Please excuse typographical errors. *

## 2023-10-17 NOTE — Care Management Important Message (Signed)
 Important Message  Patient Details  Name: Susan Sosa MRN: [id number] Date of Birth: 23-Apr-1940   Important Message Given:  Yes - Medicare IM     Rojelio [name] 10/17/2023, 4:57 PM

## 2023-10-17 NOTE — Evaluation (Signed)
 Physical Therapy Evaluation Patient Details Name: Susan Sosa MRN: [id number] DOB: 07/19/1940 Today's Date: 10/17/2023  History of Present Illness  Susan Sosa is a 83 y.o. female with medical history significant of pulmonary fibrosis idiopathic, essential hypertension, hyponatremia, hyperlipidemia, who presented to the ER with progressive shortness of breath and cough .  Patient is normally on 4 to 6 L of oxygen at home.  Presented with hypoxia now requiring 7 L [place].  Pulmonary consulted for worsening pulmonary fibrosis.  Clinical Impression  Patient resting in bed upon arrival to room; alert and oriented, follows commands and agreeable to participation with session.  Eager for OOB to chair and gait efforts as able--I've got to get better and get out of here!.  Denies pain; does endorse improvement in respiratory status since admission.  Bilat UE/LE strength and ROM Grossly symmetrical and WFL for basic transfers and gait; no focal weakness appreciated. Able to complete bed mobility with mod indep; sit/stand, basic transfers and gait (100') without assist device, cga/close sup. Demonstrates reciprocal stepping pattern, mildly antalgic with slightly broader BOS; decreased cadence, but fair/good control. 1-brief standing rest break to complete distance; self-paced as needed. Mod SOB with fatigue; sats 70% on 6L after distance, recovering >90% within 2 min of seated rest and pursed lip breathing  Would benefit from skilled PT to address above deficits and promote optimal return to PLOF.; recommend post-acute PT follow up as indicated by interdisciplinary care team.   '         If plan is discharge home, recommend the following: A little help with walking and/or transfers;A little help with bathing/dressing/bathroom   Can travel by private vehicle        Equipment Recommendations Rolling walker (2 wheels)  Recommendations for Other Services       Functional Status Assessment Patient has had a  recent decline in their functional status and demonstrates the ability to make significant improvements in function in a reasonable and predictable amount of time.     Precautions / Restrictions Precautions Precautions: Fall Recall of Precautions/Restrictions: Intact Restrictions Weight Bearing Restrictions Per Provider Order: No      Mobility  Bed Mobility Overal bed mobility: Modified Independent                  Transfers Overall transfer level: Needs assistance Equipment used: None Transfers: Sit to/from Stand, Bed to chair/wheelchair/BSC Sit to Stand: Supervision Stand pivot transfers: Supervision              Ambulation/Gait Ambulation/Gait assistance: Supervision, Contact guard assist Gait Distance (Feet): 100 Feet Assistive device: None         General Gait Details: reciprocal stepping pattern, mildly antalgic with slightly broader BOS; decreased cadence, but fair/good control.  1-brief standing rest break to complete distance; self-paced as needed.  Mod SOB with fatigue; sats 70% on 6L after distance, recovering >90% within 2 min of seated rest and pursed lip breathing  Stairs            Wheelchair Mobility     Tilt Bed    Modified Rankin (Stroke Patients Only)       Balance Overall balance assessment: Needs assistance Sitting-balance support: No upper extremity supported, Feet supported Sitting balance-Leahy Scale: Good     Standing balance support: No upper extremity supported Standing balance-Leahy Scale: Fair  Pertinent Vitals/Pain Pain Assessment Pain Assessment: No/denies pain    Home Living Family/patient expects to be discharged to:: Private residence Living Arrangements: Spouse/significant other Available Help at Discharge: Family;Available 24 hours/day Type of Home: House Home Access: Stairs to enter Entrance Stairs-Rails: Left Entrance Stairs-Number of Steps: 4   Home  Layout: One level Home Equipment: Grab bars - tub/shower;Grab bars - toilet;Shower seat - built in      Prior Function Prior Level of Function : Independent/Modified Independent;Driving             Mobility Comments: Indep with no AD, within the last 4 month pt reports limited physical activity compared to her normal on the treadmill, currently can only tolerate or less due to feelings of SOB.  Home O2 at 6L ADLs Comments: IND for ADls/IADLs, minimal driving     Extremity/Trunk Assessment   Upper Extremity Assessment Upper Extremity Assessment: Overall WFL for tasks assessed    Lower Extremity Assessment Lower Extremity Assessment: Overall WFL for tasks assessed (grossly at least 4/5 throughout; no focal weakness appreciated)       Communication        Cognition Arousal: Alert Behavior During Therapy: WFL for tasks assessed/performed   PT - Cognitive impairments: No apparent impairments                                 Cueing       General Comments      Exercises Other Exercises Other Exercises: Educated in activity pacing, energy conservation, home safety modifications, O2 line management; patient voiced understanding of all information.   Assessment/Plan    PT Assessment Patient needs continued PT services  PT Problem List Decreased activity tolerance;Decreased balance;Decreased mobility;Decreased knowledge of use of DME;Decreased safety awareness;Decreased knowledge of precautions;Cardiopulmonary status limiting activity       PT Treatment Interventions DME instruction;Gait training;Stair training;Functional mobility training;Therapeutic activities;Therapeutic exercise;Balance training;Patient/family education    PT Goals (Current goals can be found in the Care Plan section)  Acute Rehab PT Goals Patient Stated Goal: to go home! PT Goal Formulation: With patient Time For Goal Achievement: 10/31/23 Potential to Achieve Goals:  Good    Frequency Min 2X/week     Co-evaluation               AM-PAC PT 6 Clicks Mobility  Outcome Measure Help needed turning from your back to your side while in a flat bed without using bedrails?: None Help needed moving from lying on your back to sitting on the side of a flat bed without using bedrails?: None Help needed moving to and from a bed to a chair (including a wheelchair)?: A Little Help needed standing up from a chair using your arms (e.g., wheelchair or bedside chair)?: A Little Help needed to walk in hospital room?: A Little Help needed climbing 3-5 steps with a railing? : A Little 6 Click Score: 20    End of Session   Activity Tolerance: Patient tolerated treatment well Patient left: in chair;with call bell/phone within reach;with chair alarm set Nurse Communication: Mobility status PT Visit Diagnosis: Muscle weakness (generalized) (M62.81);Difficulty in walking, not elsewhere classified (R26.2)    Time: 8848-8788 PT Time Calculation (min) (ACUTE ONLY): 20 min   Charges:   PT Evaluation $PT Eval Moderate Complexity: 1 Mod PT Treatments $Therapeutic Activity: 8-22 mins PT General Charges $$ ACUTE PT VISIT: 1 Visit  Calista Crain H. Delores, PT, DPT, NCS 10/17/23, 3:59 PM [phone]

## 2023-10-17 NOTE — Progress Notes (Signed)
 Daily Progress Note   Patient Name: Susan Sosa       Date: 10/17/2023 DOB: 09-26-1940  Age: 83 y.o. MRN#: [id number] Attending Physician: Jerelene Critchley, MD Primary Care Physician: Alla Amis, MD Admit Date: 10/14/2023  Reason for Consultation/Follow-up: Establishing goals of care  HPI/Brief Hospital Review:  83 y.o. female  with past medical history of idiopathic pulmonary fibrosis, chronic hypoxic respiratory failure--wears 6L [place] at baseline, pulmonary hypertension, hypothyroidism, HLD, CKD stage II and nonischemic cardiomyopathy admitted from home on 10/14/2023 with worsening shortness of breath. Noted that Susan Sosa was seen by her pulmonologist 10/11/2023 who at that time recommended hospitalization but Susan Sosa declined. Sent home with decadron and Levaquin--per Susan Sosa, symptoms improved somewhat but then worsened significantly, prompting her visit to ER.   Recent imaging--CT scan, revealing worsening of interstitial pulmonary fibrosis--no acute infectious process   Admitted and being treated for acute on chronic hypoxemic respiratory failure secondary to progression of IPF, echo completed--EF 55-60%, mild left ventricular hypertrophy, grade I diastolic dysfunction   Palliative medicine was consulted for assisting with goals of care conversations.  Subjective: Extensive chart review has been completed prior to meeting patient including labs, vital signs, imaging, progress notes, orders, and available advanced directive documents from current and previous encounters.    Visited with Susan Sosa at her bedside. She is awake, alert and able to engage in conversation. No visitors or family at bedside during time of visit.  Susan Sosa reports feeling well today without  acute complaints. Shares she was able to rest well overnight, tolerated breakfast. She was able to walk back and forth from her room to nurses station yesterday with OT. Reports feeling some weakness in her legs, breathing remained stable--denied increased WOB or increased dyspnea.  We discussed the role of outpatient palliative care. Susan Sosa agrees to outpatient palliative referral being placed at discharge. Hopeful for discharge home in next 1-2 days. Pulmonology reducing dose of steroids, continuing Metaneb recruitment.  Answered and addressed all questions and concerns. PMT to step away from daily visits, Susan Sosa provided PMT contact information and encouraged to reach out for any questions or concerns.  Objective:  Physical Exam Constitutional:      General: She is not in acute distress.    Appearance: She is ill-appearing.  HENT:  Head: Normocephalic.  Pulmonary:     Effort: Pulmonary effort is normal. No respiratory distress.  Skin:    General: Skin is warm and dry.  Neurological:     Mental Status: She is alert and oriented to person, place, and time.     Motor: Weakness present.  Psychiatric:        Mood and Affect: Mood normal.        Behavior: Behavior normal.        Thought Content: Thought content normal.             Vital Signs: BP 126/74 (BP Location: Left Arm)   Pulse 86   Temp 97.6 F (36.4 C) (Oral)   Resp 17   Wt 59.9 kg   SpO2 (!) 70%   BMI 26.66 kg/m  SpO2: SpO2: (!) 70 % O2 Device: O2 Device: Nasal Cannula O2 Flow Rate: O2 Flow Rate (L/min): 6 L/min   Palliative Care Assessment & Plan   Assessment/Recommendation/Plan  Referral for outpatient palliative at discharge  Thank you for allowing the Palliative Medicine Team to assist in the care of this patient.  Visit includes: Detailed review of medical records (labs, imaging, vital signs), medically appropriate exam (mental status, respiratory, cardiac, skin), discussed with treatment  team, counseling and educating patient, family and staff, documenting clinical information, medication management and coordination of care.  Waddell Lesches, DNP, AGNP-C Palliative Medicine   Please contact Palliative Medicine Team phone at [phone] for questions and concerns.

## 2023-10-17 NOTE — Progress Notes (Signed)
   10/17/23 1537  Therapy Vitals  Temp 97.6 F (36.4 C)  Temp Source Oral  Pulse Rate 86  Resp 17  BP 126/74  Patient Position (if appropriate) Sitting  Oxygen Therapy  SpO2 92 %  O2 Device Nasal Cannula  Mobility  Activity Ambulated independently;Ambulated with assistance;Stood at bedside;Respositioned in chair  Level of Assistance Standby assist, set-up cues, supervision of patient - no hands on  Assistive Device None  Distance Ambulated (ft) 50 ft  Range of Motion/Exercises Active Assistive  Activity Response Tolerated well  Mobility visit 1 Mobility  Mobility Specialist Start Time (ACUTE ONLY) 0326  Mobility Specialist Stop Time (ACUTE ONLY) 0348  Mobility Specialist Time Calculation (min) (ACUTE ONLY) 22 min   Mobility Specialist - Progress Note Pt was in the recliner upon entry. Pt agreed to mobility. Nurse entered to take vital before activity. Pt is on O2 @ 6Lpm. Pt was able to STS independently. O2 vital @ STS before activity was 94% @ 6Lpm. Pt ambulated well. Pt gait was firm and confident. Pt did not need a break for recovery. Pt was able to compplete leg extension activities. After activities pt was able to reposition themselves in the recliner with her husband and needs within reach.  Clem Rodes Mobility Specialist 10/17/23, 3:58 PM

## 2023-10-17 NOTE — Progress Notes (Signed)
 PULMONOLOGY         Date: 10/17/2023,   MRN# [id number] Susan Sosa [birth year]     AdmissionWeight: 59.9 kg                 CurrentWeight: 59.9 kg  Referring provider: Dr Jerelene   CHIEF COMPLAINT:   Acute on chronic hypoxemic respiratory failure   HISTORY OF PRESENT ILLNESS   This is an 83 yo F with hx of chronic lung disease with IPF and chronic hypoxemia, HTN,cardiac dysfunction with AF, GERD who has been declining over past 1-2 years despite aggressive medical therapy.  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  She at times gets respiratory infections but generally bounces back after course of abx and steroid taper.  She has also been on steroids with decadron with partial improvement. 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  Her PFT have also declined over last five years but FEV1 this year has been spared >80% with severe decrement in DLCO as excpected with PH and ILD.  She has a previous history of smoking for appx 40years and does have emphysematous changes overlying her UIP pattern of fibrosis.   10/17/23- patient is now DNR post palliative care evaluation appreciate collaboration. Today steroids have been reduced to 60iv BID solumedrol.  Patient on 6l/min [place], worked with OT yesterday but not PT.  She has not had any overnight events.  She made good urine output overnight with lasix  will continue this once daily.  Continue metaneb recruitment.   Will continue to follow along daily. Hopefully dc in 48h.  PAST MEDICAL HISTORY   Past Medical History:  Diagnosis Date   Arthritis    Heart murmur    Hyperlipidemia    Hypertension    Hyponatremia    IPF (idiopathic pulmonary  fibrosis) (HCC)      SURGICAL HISTORY   Past Surgical History:  Procedure Laterality Date   LEFT HEART CATH AND CORONARY ANGIOGRAPHY N/A 11/27/2021   Procedure: LEFT HEART CATH AND CORONARY ANGIOGRAPHY and possible pci and stent;  Surgeon: Florencio Cara BIRCH, MD;  Location: [facility] INVASIVE CV [facility];  Service: Cardiovascular;  Laterality: N/A;   RIGHT HEART CATH Right 08/10/2023   Procedure: RIGHT HEART CATH;  Surgeon: Florencio Cara BIRCH, MD;  Location: [facility] INVASIVE CV LAB;  Service: Cardiovascular;  Laterality: Right;     FAMILY HISTORY   History reviewed. No pertinent family history.   SOCIAL HISTORY   Social History   Tobacco Use   Smoking status: Former   Smokeless tobacco: Never   Tobacco comments:    Quit 30 years ago  Substance Use Topics   Alcohol use: Never   Drug use: Never     MEDICATIONS    Home Medication:    Current Medication:  Current Facility-Administered Medications:    acetylcysteine  (MUCOMYST ) 20 % nebulizer / oral solution 4 mL, 4 mL, Nebulization, TID, Ponnala, Shruthi, MD, 4 mL at 10/16/23 1915   aspirin  chewable tablet 81 mg, 81 mg, Oral, QPM, Ponnala, Shruthi, MD, 81 mg at 10/16/23 1833   atorvastatin  (LIPITOR) tablet 40 mg, 40 mg, Oral, Daily, Ponnala, Shruthi, MD, 40 mg at 10/16/23 1002   cloNIDine  (CATAPRES  -  Dosed in mg/24 hr) patch 0.1 mg, 0.1 mg, Transdermal, Weekly, Ponnala, Shruthi, MD, 0.1 mg at 10/15/23 1012   enoxaparin  (LOVENOX ) injection 40 mg, 40 mg, Subcutaneous, Q24H, Sim, Mohammad L, MD, 40 mg at 10/16/23 2109   furosemide  (LASIX ) injection 40 mg, 40 mg, Intravenous, Daily, Henrik Orihuela, MD, 40 mg at 10/16/23 1002   hydrALAZINE  (APRESOLINE ) injection 10 mg, 10 mg, Intravenous, Q6H PRN, Ponnala, Shruthi, MD   ipratropium-albuterol  (DUONEB) 0.5-2.5 (3) MG/3ML nebulizer solution 3 mL, 3 mL, Nebulization, TID, Ponnala, Shruthi, MD, 3 mL at 10/16/23 1915   losartan  (COZAAR ) tablet 25 mg, 25 mg, Oral, Daily, Ponnala, Shruthi, MD,  25 mg at 10/16/23 1002   methylPREDNISolone  sodium succinate (SOLU-MEDROL ) 125 mg/2 mL injection 80 mg, 80 mg, Intravenous, Q12H, Raylynne Cubbage, MD, 80 mg at 10/17/23 9266   morphine  (PF) 2 MG/ML injection 2 mg, 2 mg, Intravenous, Q2H PRN, Sim, Mohammad L, MD   ondansetron  (ZOFRAN ) tablet 4 mg, 4 mg, Oral, Q6H PRN **OR** ondansetron  (ZOFRAN ) injection 4 mg, 4 mg, Intravenous, Q6H PRN, Sim, Mohammad L, MD   pantoprazole  (PROTONIX ) EC tablet 40 mg, 40 mg, Oral, QHS, Ponnala, Shruthi, MD, 40 mg at 10/16/23 2109   Pirfenidone  TABS 534 mg, 534 mg, Oral, TID with meals, Ponnala, Shruthi, MD, 534 mg at 10/16/23 1833   verapamil  (CALAN -SR) CR tablet 180 mg, 180 mg, Oral, QHS, Ponnala, Shruthi, MD, 180 mg at 10/16/23 2109    ALLERGIES   Latex, Lidocaine, Atorvastatin , Losartan , Metronidazole, and Oxycodone      REVIEW OF SYSTEMS    Review of Systems:  Gen:  Denies  fever, sweats, chills weigh loss  HEENT: Denies blurred vision, double vision, ear pain, eye pain, hearing loss, nose bleeds, sore throat Cardiac:  No dizziness, chest pain or heaviness, chest tightness,edema Resp:   reports dyspnea chronically  Gi: Denies swallowing difficulty, stomach pain, nausea or vomiting, diarrhea, constipation, bowel incontinence Gu:  Denies bladder incontinence, burning urine Ext:   Denies Joint pain, stiffness or swelling Skin: Denies  skin rash, easy bruising or bleeding or hives Endoc:  Denies polyuria, polydipsia , polyphagia or weight change Psych:   Denies depression, insomnia or hallucinations   Other:  All other systems negative   VS: BP (!) 140/76 (BP Location: Left Arm)   Pulse 71   Temp (!) 97.5 F (36.4 C)   Resp 18   Wt 59.9 kg   SpO2 91%   BMI 26.66 kg/m      PHYSICAL EXAM    GENERAL:NAD, no fevers, chills, no weakness no fatigue HEAD: Normocephalic, atraumatic.  EYES: Pupils equal, round, reactive to light. Extraocular muscles intact. No scleral icterus.  MOUTH:  Moist mucosal membrane. Dentition intact. No abscess noted.  EAR, NOSE, THROAT: Clear without exudates. No external lesions.  NECK: Supple. No thyromegaly. No nodules. No JVD.  PULMONARY: decreased breath sounds with mild rhonchi worse at bases bilaterally.  CARDIOVASCULAR: S1 and S2. Regular rate and rhythm. No murmurs, rubs, or gallops. No edema. Pedal pulses 2+ bilaterally.  GASTROINTESTINAL: Soft, nontender, nondistended. No masses. Positive bowel sounds. No hepatosplenomegaly.  MUSCULOSKELETAL: No swelling, clubbing, or edema. Range of motion full in all extremities.  NEUROLOGIC: Cranial nerves II through XII are intact. No gross focal neurological deficits. Sensation intact. Reflexes intact.  SKIN: No ulceration, lesions, rashes, or cyanosis. Skin warm and dry. Turgor intact.  PSYCHIATRIC: Mood, affect within normal limits. The patient is awake, alert and oriented x 3. Insight, judgment intact.  IMAGING   @IMAGES @   ASSESSMENT/PLAN   Acute on chronic hypoxemic respiratory failure       Currently negative for viral LRTI due to negative RVP and SARS/FLU/RSV panel      - inflammatory workup with ESR and CPR is flat which points away from acute exacerbation of ILD although she is on steroids with solumedrol 125 IV.     - Her CT scan did not show much interstitial edema or effusion however did have elevated BNP suggestive of acute on chronic CHF. An echo has been ordered to evaluate interval changes with cardiac function. She follows up with Dr Florencio [facility] cardiology.     Chronic Idiopathic pulmonary fibrosis    - advanced and progressed over past 5 years    - will continue Esbrient home dosing as current    Centrilobular emphysema -no signs of acute exacerbation of COPD  - viral workup and inflammatory biomarkers are negative   - duoneb prn is sufficient at this time   -reduced solumedrol to 80 IV bid   Pulmonary hypertension associated with pulmonary fibrosis    - will  dc tyvasso DPI while hospitalized    -PT/OT ordered   Bibasilar atelectasis   - recruitement maneuvers via chest physiotherapy utilizing metaneb with duoneb     - PT /OT  Physical deconditioning   Continue PT/OT , continue chest PT  BODE score >8 with poor long term prognosis   Goals of care     - patient is currently full code status, my recommendation is to proceed with DNR and consider outpatient palliative.  Consultation for palliative care evaluation has been placed      Thank you for allowing me to participate in the care of this patient.   Patient/Family are satisfied with care plan and all questions have been answered.    Provider disclosure: Patient with at least one acute or chronic illness or injury that poses a threat to life or bodily function and is being managed actively during this encounter.  All of the below services have been performed independently by signing provider:  review of prior documentation from internal and or external health records.  Review of previous and current lab results.  Interview and comprehensive assessment during patient visit today. Review of current and previous chest radiographs/CT scans. Discussion of management and test interpretation with health care team and patient/family.   This document was prepared using Dragon voice recognition software and may include unintentional dictation errors.     Oceana Walthall, M.D.  Division of Pulmonary & Critical Care Medicine

## 2023-10-18 ENCOUNTER — Other Ambulatory Visit: Payer: Self-pay

## 2023-10-18 DIAGNOSIS — J9621 Acute and chronic respiratory failure with hypoxia: Secondary | ICD-10-CM | POA: Diagnosis not present

## 2023-10-18 LAB — BASIC METABOLIC PANEL WITH GFR
Anion gap: 17 — ABNORMAL HIGH (ref 5–15)
BUN: 32 mg/dL — ABNORMAL HIGH (ref 8–23)
CO2: 28 mmol/L (ref 22–32)
Calcium: 8.7 mg/dL — ABNORMAL LOW (ref 8.9–10.3)
Chloride: 92 mmol/L — ABNORMAL LOW (ref 98–111)
Creatinine, Ser: 1.04 mg/dL — ABNORMAL HIGH (ref 0.44–1.00)
GFR, Estimated: 53 mL/min — ABNORMAL LOW (ref >60)
Glucose, Bld: 154 mg/dL — ABNORMAL HIGH (ref 70–99)
Potassium: 4.2 mmol/L (ref 3.5–5.1)
Sodium: 137 mmol/L (ref 135–145)

## 2023-10-18 MED ORDER — PREDNISONE 5 MG PO TABS
ORAL_TABLET | ORAL | 0 refills | Status: AC
  Filled 2023-10-18: qty 110, 10d supply, fill #0

## 2023-10-18 MED ORDER — METHYLPREDNISOLONE SODIUM SUCC 40 MG IJ SOLR: 40.0000 mg | Freq: Two times a day (BID) | INTRAMUSCULAR | Status: DC

## 2023-10-18 NOTE — Plan of Care (Signed)
   Problem: Clinical Measurements: Goal: Ability to maintain clinical measurements within normal limits will improve Outcome: Progressing Goal: Will remain free from infection Outcome: Progressing Goal: Diagnostic test results will improve Outcome: Progressing Goal: Respiratory complications will improve Outcome: Progressing Goal: Cardiovascular complication will be avoided Outcome: Progressing   Problem: Coping: Goal: Level of anxiety will decrease Outcome: Progressing

## 2023-10-18 NOTE — Progress Notes (Signed)
 Occupational Therapy Treatment Patient Details Name: Susan Sosa MRN: [id number] DOB: 1940-03-30 Today's Date: 10/18/2023   History of present illness Susan Sosa is a 83 y.o. female with medical history significant of pulmonary fibrosis idiopathic, essential hypertension, hyponatremia, hyperlipidemia, who presented to the ER with progressive shortness of breath and cough .  Patient is normally on 4 to 6 L of oxygen at home.  Presented with hypoxia now requiring 7 L [place].  Pulmonary consulted for worsening pulmonary fibrosis.   OT comments  Pt is supine in bed on arrival. Pleasant and agreeable to OT session. She denies pain. Educated pt in energy conservation, activity pacing, use of DME/AE/AD, modifications for home safety, O2 line management and PLB to maximize safety and IND to prevent overexertion.  Pt verbalized understanding. Pt is well versed  in most of these concepts as she reports dealing with this for the last 5 years. She demo UB/LB dressing with set up assist and independent ambulation without AD within the room on 6L 02. Sp02 briefly removed for UB dressing with noted drop in 02, however quickly improved with PLB and replacing 6L 02. Pt is hopeful to discharge home this date and has strong family support from her husband at home. Pt left seated in recliner with all needs in place and will cont to require skilled acute OT services to maximize her safety and IND to return to PLOF.       If plan is discharge home, recommend the following:  A little help with bathing/dressing/bathroom;Assist for transportation   Equipment Recommendations  None recommended by OT    Recommendations for Other Services      Precautions / Restrictions Precautions Precautions: Fall Recall of Precautions/Restrictions: Intact Restrictions Weight Bearing Restrictions Per Provider Order: No       Mobility Bed Mobility Overal bed mobility: Independent                  Transfers Overall  transfer level: Independent Equipment used: None               General transfer comment: able to stand and ambulate within room without AD with steady gait IND     Balance Overall balance assessment: Modified Independent                                         ADL either performed or assessed with clinical judgement   ADL Overall ADL's : Needs assistance/impaired                 Upper Body Dressing : Set up;Sitting;Standing   Lower Body Dressing: Sit to/from stand;Set up Lower Body Dressing Details (indicate cue type and reason): to donn shoes and pants             Functional mobility during ADLs: Independent      Extremity/Trunk Assessment              Vision       Perception     Praxis     Communication Communication Communication: No apparent difficulties   Cognition Arousal: Alert Behavior During Therapy: WFL for tasks assessed/performed Cognition: No apparent impairments                                        Cueing  Exercises Other Exercises Other Exercises: Educated pt in energy conservation, activity pacing, use of DME/AE/AD, modifications for home safety, O2 line management and PLB to maximize safety and IND to prevent overexertion. Pt verbalized understanding.    Shoulder Instructions       General Comments sp02 dropped to 70% when pt removed 02 to donn shirt, quickly improved to 92% on 6L with PLB and rest    Pertinent Vitals/ Pain       Pain Assessment Pain Assessment: No/denies pain  Home Living                                          Prior Functioning/Environment              Frequency  Min 1X/week        Progress Toward Goals  OT Goals(current goals can now be found in the care plan section)  Progress towards OT goals: Progressing toward goals  Acute Rehab OT Goals Patient Stated Goal: improve function/activity tolerance OT Goal Formulation:  With patient Time For Goal Achievement: 10/30/23 Potential to Achieve Goals: Good  Plan      Co-evaluation                 AM-PAC OT 6 Clicks Daily Activity     Outcome Measure   Help from another person eating meals?: None Help from another person taking care of personal grooming?: None Help from another person toileting, which includes using toliet, bedpan, or urinal?: A Little Help from another person bathing (including washing, rinsing, drying)?: None Help from another person to put on and taking off regular upper body clothing?: None Help from another person to put on and taking off regular lower body clothing?: None 6 Click Score: 23    End of Session Equipment Utilized During Treatment: Oxygen  OT Visit Diagnosis: Other abnormalities of gait and mobility (R26.89)   Activity Tolerance Patient tolerated treatment well   Patient Left in chair;with call bell/phone within reach   Nurse Communication Mobility status        Time: 8878-8858 OT Time Calculation (min): 20 min  Charges: OT General Charges $OT Visit: 1 Visit OT Treatments $Self Care/Home Management : 8-22 mins  Tell Susan Sosa, OTR/L  10/18/23, 1:38 PM   Anzleigh Slaven E Natiya Seelinger 10/18/2023, 1:36 PM

## 2023-10-18 NOTE — Progress Notes (Signed)
 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  She at times gets respiratory infections but generally bounces back after course of abx and steroid taper.  She has also been on steroids with decadron with partial improvement. 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  Her PFT have also declined over last five years but FEV1 this year has been spared >80% with severe decrement in DLCO as excpected with PH and ILD.  She has a previous history of smoking for appx 40years and does have emphysematous changes overlying her UIP pattern of fibrosis.   10/18/23- patient reports feeling much improved.  She is close to baseline.  We did DPI tyvasso together and I monitored her for appx 15 minutes post dosing with 16mcg without any cough or chest discomfort.  She wants to continue this therapy which we will do on outpatient. She had PT with notable reduction in cadence but 100% independent mobility.  She would like to go home and I think that is reasonable considering her advanced pulmonary fiborsis and chronic hypoxemia.  She is very pleased with her therapy and improvement. She's euvolemic with mild aki so I have stopped lasix .  She is on  solumedrol tapering dosing with 60 bid yesterday and reduced to 40 bid today. She may be dcd on prednisone  with 50mg  po BID starting tommorow and I would recommend reducing this dose by 5mg  daily until weaned off over 10 day period.  I can follow up with patient next week in clinic to continue her care.   PAST MEDICAL HISTORY   Past Medical History:  Diagnosis Date   Arthritis    Heart murmur    Hyperlipidemia    Hypertension    Hyponatremia    IPF (idiopathic pulmonary fibrosis) (HCC)      SURGICAL HISTORY   Past Surgical History:  Procedure Laterality Date   LEFT HEART CATH AND CORONARY ANGIOGRAPHY N/A 11/27/2021   Procedure: LEFT HEART CATH AND CORONARY ANGIOGRAPHY and possible pci and stent;  Surgeon: Florencio Cara BIRCH, MD;  Location: [facility];  Service: Cardiovascular;  Laterality: N/A;   RIGHT HEART CATH Right 08/10/2023   Procedure: RIGHT HEART CATH;  Surgeon: Florencio Cara BIRCH, MD;  Location: [facility];  Service: Cardiovascular;  Laterality: Right;     FAMILY HISTORY   History reviewed. No pertinent family history.   SOCIAL HISTORY   Social History   Tobacco Use   Smoking status: Former   Smokeless tobacco: Never   Tobacco comments:    Quit 30 years ago  Substance Use Topics   Alcohol  use: Never   Drug use: Never     MEDICATIONS    Home Medication:    Current Medication:  Current Facility-Administered Medications:    acetylcysteine  (MUCOMYST ) 20 % nebulizer / oral solution 4 mL, 4 mL, Nebulization, TID, Ponnala, Shruthi, MD, 4 mL at 10/17/23 2020   aspirin  chewable tablet 81 mg, 81 mg, Oral, QPM, Ponnala, Shruthi, MD, 81 mg at 10/17/23 1753   atorvastatin  (LIPITOR) tablet 40 mg, 40 mg, Oral, Daily, Ponnala, Shruthi, MD, 40 mg at 10/17/23 1107   cloNIDine  (CATAPRES  - Dosed in mg/24 hr) patch 0.1 mg, 0.1 mg, Transdermal, Weekly, Ponnala, Shruthi, MD, 0.1 mg at 10/15/23 1012   enoxaparin  (LOVENOX ) injection 40 mg, 40 mg,  Subcutaneous, Q24H, Sim, Mohammad L, MD, 40 mg at 10/17/23 2127   furosemide  (LASIX ) injection 40 mg, 40 mg, Intravenous, Daily, Daya Dutt, MD, 40 mg at 10/17/23 1107   hydrALAZINE  (APRESOLINE ) injection 10 mg, 10 mg, Intravenous, Q6H PRN, Ponnala, Shruthi, MD   ipratropium-albuterol  (DUONEB) 0.5-2.5 (3) MG/3ML nebulizer solution 3 mL, 3 mL, Nebulization, TID, Ponnala, Shruthi, MD, 3 mL at 10/17/23 2020   losartan  (COZAAR ) tablet 25 mg, 25 mg, Oral, Daily, Ponnala, Shruthi, MD, 25 mg at 10/17/23 1107   methylPREDNISolone  sodium succinate (SOLU-MEDROL ) 125 mg/2 mL injection 60 mg, 60 mg, Intravenous, Q12H, Thania Woodlief, MD, 60 mg at 10/18/23 0534   morphine  (PF) 2 MG/ML injection 2 mg, 2 mg, Intravenous, Q2H PRN, Sim, Mohammad L, MD   ondansetron  (ZOFRAN ) tablet 4 mg, 4 mg, Oral, Q6H PRN **OR** ondansetron  (ZOFRAN ) injection 4 mg, 4 mg, Intravenous, Q6H PRN, Sim, Mohammad L, MD   pantoprazole  (PROTONIX ) EC tablet 40 mg, 40 mg, Oral, QHS, Ponnala, Shruthi, MD, 40 mg at 10/17/23 2127   Pirfenidone  TABS 534 mg, 534 mg, Oral, TID with meals, Ponnala, Shruthi, MD, 534 mg at 10/17/23 1950   verapamil  (CALAN -SR) CR tablet 180 mg, 180 mg, Oral, QHS, Ponnala, Shruthi, MD, 180 mg at 10/17/23 2127    ALLERGIES   Latex, Lidocaine, Atorvastatin , Losartan , Metronidazole, and Oxycodone      REVIEW OF SYSTEMS    Review of Systems:  Gen:  Denies  fever, sweats, chills weigh loss  HEENT: Denies blurred vision, double vision, ear pain, eye pain, hearing loss, nose bleeds, sore throat Cardiac:  No dizziness, chest pain or heaviness, chest tightness,edema Resp:   reports dyspnea chronically  Gi: Denies swallowing difficulty, stomach pain, nausea or vomiting, diarrhea, constipation, bowel incontinence Gu:  Denies bladder incontinence, burning urine Ext:   Denies Joint pain, stiffness or swelling Skin: Denies  skin rash, easy bruising or bleeding or hives Endoc:  Denies polyuria, polydipsia ,  polyphagia or weight change Psych:   Denies depression, insomnia or hallucinations   Other:  All other systems negative   VS: BP (!) 151/78 (BP Location: Right Arm)   Pulse 67   Temp 97.8 F (36.6 C) (Oral)   Resp 18   Wt 59.9 kg   SpO2 95%   BMI 26.66 kg/m      PHYSICAL EXAM    GENERAL:NAD, no fevers, chills, no weakness no fatigue HEAD: Normocephalic, atraumatic.  EYES: Pupils equal, round, reactive to light. Extraocular muscles intact. No scleral icterus.  MOUTH: Moist mucosal membrane. Dentition intact. No abscess noted.  EAR, NOSE, THROAT: Clear without exudates. No external lesions.  NECK: Supple. No thyromegaly. No nodules. No JVD.  PULMONARY: decreased breath sounds with mild rhonchi worse at bases bilaterally.  CARDIOVASCULAR: S1 and S2. Regular rate and  rhythm. No murmurs, rubs, or gallops. No edema. Pedal pulses 2+ bilaterally.  GASTROINTESTINAL: Soft, nontender, nondistended. No masses. Positive bowel sounds. No hepatosplenomegaly.  MUSCULOSKELETAL: No swelling, clubbing, or edema. Range of motion full in all extremities.  NEUROLOGIC: Cranial nerves II through XII are intact. No gross focal neurological deficits. Sensation intact. Reflexes intact.  SKIN: No ulceration, lesions, rashes, or cyanosis. Skin warm and dry. Turgor intact.  PSYCHIATRIC: Mood, affect within normal limits. The patient is awake, alert and oriented x 3. Insight, judgment intact.       IMAGING   @IMAGES @   ASSESSMENT/PLAN   Acute on chronic hypoxemic respiratory failure       Currently negative for viral LRTI due to negative RVP and SARS/FLU/RSV panel      - inflammatory workup with ESR and CPR is flat which points away from acute exacerbation of ILD although she is on steroids with solumedrol 125 IV.     - Her CT scan did not show much interstitial edema or effusion however did have elevated BNP suggestive of acute on chronic CHF. An echo has been ordered to evaluate interval changes  with cardiac function. She follows up with Dr Florencio [facility] cardiology.     Chronic Idiopathic pulmonary fibrosis    - advanced and progressed over past 5 years    - will continue Esbrient home dosing as current    Centrilobular emphysema -no signs of acute exacerbation of COPD  - viral workup and inflammatory biomarkers are negative   - duoneb prn is sufficient at this time   -reduced solumedrol to 80 IV bid   Pulmonary hypertension associated with pulmonary fibrosis    - will dc tyvasso DPI while hospitalized    -PT/OT ordered   Bibasilar atelectasis   - recruitement maneuvers via chest physiotherapy utilizing metaneb with duoneb     - PT /OT  Physical deconditioning   Continue PT/OT , continue chest PT  BODE score >8 with poor long term prognosis   Goals of care     - patient is currently full code status, my recommendation is to proceed with DNR and consider outpatient palliative.  Consultation for palliative care evaluation has been placed      Thank you for allowing me to participate in the care of this patient.   Patient/Family are satisfied with care plan and all questions have been answered.    Provider disclosure: Patient with at least one acute or chronic illness or injury that poses a threat to life or bodily function and is being managed actively during this encounter.  All of the below services have been performed independently by signing provider:  review of prior documentation from internal and or external health records.  Review of previous and current lab results.  Interview and comprehensive assessment during patient visit today. Review of current and previous chest radiographs/CT scans. Discussion of management and test interpretation with health care team and patient/family.   This document was prepared using Dragon voice recognition software and may include unintentional dictation errors.     Bethene Hankinson, M.D.  Division of Pulmonary & Critical  Care Medicine

## 2023-10-18 NOTE — Discharge Summary (Signed)
 Physician Discharge Summary   Patient: Susan Sosa MRN: [id number] DOB: [birth year]  Admit date:     10/14/2023  Discharge date: 10/18/23  Discharge Physician: Laree Lock   PCP: Alla Amis, MD   Recommendations at discharge:   Follow-up with PCP within 1 week - Repeat BMP  Follow-up with pulmonary outpatient in 1 week Follow-up with palliative outpatient  On 4 to 6 L [place] Home PT/OT/RN arranged  Discharge Diagnoses: Principal Problem:   Acute on chronic respiratory failure with hypoxemia (HCC) Active Problems:   Idiopathic pulmonary fibrosis (HCC)   Benign essential hypertension   Paroxysmal atrial tachycardia   Osteoarthritis of knee   NICM (nonischemic cardiomyopathy) [facility])  Hospital Course: Susan Sosa is a 83 y.o. female with medical history significant of pulmonary fibrosis idiopathic, essential hypertension, hyponatremia, hyperlipidemia, who presented to the ER with progressive shortness of breath and cough .  Patient is normally on 4 to 6 L of oxygen at home.  Presented with hypoxia now requiring 7 L [place].  Pulmonary consulted for worsening pulmonary fibrosis.  Hospital course as below   Acute on chronic hypoxic respiratory failure Interstitial pulmonary fibrosis - Secondary to worsening pulmonary fibrosis, on 4-6L [place] at home, now at 6L [place] at baseline - Leukocytosis on admission resolved, RVP neg - CXR hypoinflation with chronic changes over the lung bases, compatible with pulmonary fibrosis - s/p IV Solumedrol taper, discharge on prednisone  taper 50mg  bid, decrease by 5 mg daily over 10 days - DuoNebs prn, pirfenidone , Tyvasso DPI outpatient - Palliative care outpatient - Follow-up with pulmonary outpatient in 1 week  Acute on chronic CHF - BNP elevated - Echo shows EF 55 to 60%, no RWMA.  Grade I diastolic dysfunction.  Trivial MR. - s/p  IV Lasix  40mg   Mild AKI - Lasix  discontinued - Follow up with PCP for repeat BMP   Hypertension - On clonidine ,  losartan    Paroxysmal atrial tachycardia - Rate controlled - On verapamil    Chronic kidney disease stage II - Monitor Cr   --PT/OT rec Home PT/OT Code status changed to DNR/DNI   Consultants: Pulmonary Procedures performed: None  Disposition: Home health Diet recommendation:  Discharge Diet Orders (From admission, onward)     Start     Ordered   10/18/23 0000  Diet - low sodium heart healthy        10/18/23 1429            DISCHARGE MEDICATION: Allergies as of 10/18/2023       Reactions   Latex Itching, Rash   Lidocaine Swelling   Facial swelling from a dentist Facial swelling from a dentist Facial swelling from a dentist   Atorvastatin     Severe hip pain   Losartan  Swelling   Metronidazole Other (See Comments)   Sore joints Sore joints   Oxycodone  Nausea Only        Medication List     STOP taking these medications    azithromycin 250 MG tablet Commonly known as: ZITHROMAX   dexamethasone 4 MG tablet Commonly known as: DECADRON   levofloxacin 500 MG tablet Commonly known as: LEVAQUIN       TAKE these medications    acetylcysteine  20 % nebulizer solution Commonly known as: MUCOMYST  Take 4 mLs by nebulization 3 (three) times daily.   albuterol  (2.5 MG/3ML) 0.083% nebulizer solution Commonly known as: PROVENTIL  Take 2.5 mg by nebulization in the morning and at bedtime.   ascorbic acid  500 MG tablet Commonly known as: VITAMIN  C ascorbic acid  (vitamin C) 500 mg tablet   1  by oral route.   aspirin  81 MG chewable tablet Chew 81 mg by mouth every evening.   atorvastatin  40 MG tablet Commonly known as: LIPITOR Take 40 mg by mouth daily.   budesonide-formoterol 160-4.5 MCG/ACT inhaler Commonly known as: SYMBICORT Inhale 2 puffs into the lungs 2 (two) times daily.   cholecalciferol 25 MCG (1000 UNIT) tablet Commonly known as: VITAMIN D3 Take 1,000 Units by mouth daily.   cloNIDine  0.1 mg/24hr patch Commonly known as: CATAPRES  -  Dosed in mg/24 hr Place 1 patch onto the skin See admin instructions. Place 1 patch onto the skin every 5 days   esomeprazole 40 MG capsule Commonly known as: NEXIUM Take 40 mg by mouth at bedtime.   FIBER-CAPS PO Take 1 tablet by mouth daily.   losartan  50 MG tablet Commonly known as: COZAAR  Take 25 mg by mouth in the morning and at bedtime.   nitroGLYCERIN  0.4 MG SL tablet Commonly known as: NITROSTAT  Place 1 tablet (0.4 mg total) under the tongue every 5 (five) minutes as needed for chest pain.   ondansetron  8 MG tablet Commonly known as: ZOFRAN  Take 8 mg by mouth every 12 (twelve) hours as needed for vomiting or nausea.   OXYGEN Inhale 6 L into the lungs continuous.   Pirfenidone  267 MG Tabs Take 534 mg by mouth with breakfast, with lunch, and with evening meal.   polyethylene glycol powder 17 GM/SCOOP powder Commonly known as: GLYCOLAX /MIRALAX  1 capful once a day , may use 2 per day if continued constipation   prednisoLONE  acetate 1 % ophthalmic suspension Commonly known as: PRED FORTE  Place 1 drop into the left eye at bedtime.   predniSONE  5 MG tablet Commonly known as: DELTASONE  Take 10 tablets (50 mg total) by mouth in the morning and at bedtime for 1 day, THEN 9 tablets (45 mg total) in the morning and at bedtime for 1 day, THEN 8 tablets (40 mg total) in the morning and at bedtime for 1 day, THEN 7 tablets (35 mg total) in the morning and at bedtime for 1 day, THEN 6 tablets (30 mg total) in the morning and at bedtime for 1 day, THEN 5 tablets (25 mg total) in the morning and at bedtime for 1 day, THEN 4 tablets (20 mg total) in the morning and at bedtime for 1 day, THEN 3 tablets (15 mg total) in the morning and at bedtime for 1 day, THEN 2 tablets (10 mg total) in the morning and at bedtime for 1 day, THEN 1 tablet (5 mg total) in the morning and at bedtime for 1 day. Start taking on: October 19, 2023   PreserVision/Lutein Caps Take 1 capsule by mouth every  evening.   sodium chloride  5 % ophthalmic solution Commonly known as: MURO 128 Place 1 drop into the right eye at bedtime.   Tyvaso  DPI Maintenance Kit 16 MCG Powd Generic drug: Treprostinil  Inhale 16 mcg into the lungs in the morning, at noon, in the evening, and at bedtime.   verapamil  180 MG CR tablet Commonly known as: CALAN -SR Take 180 mg by mouth at bedtime.        Discharge Exam: Filed Weights   10/14/23 1941  Weight: 59.9 kg    Constitutional: Chronically ill looking, NAD, calm, comfortable Neck: normal, supple, no masses, no thyromegaly Respiratory: CTAB Cardiovascular: Regular rate and rhythm Abdomen: no tenderness, no masses palpated. No hepatosplenomegaly. Bowel sounds positive.  Musculoskeletal: Good range of motion, no joint swelling or tenderness, Skin: no rashes, lesions, ulcers. No induration Neurologic: Aox 3, no gross focal deficits  Condition at discharge: fair  The results of significant diagnostics from this hospitalization (including imaging, microbiology, ancillary and laboratory) are listed below for reference.   Imaging Studies: ECHOCARDIOGRAM COMPLETE Result Date: 10/17/2023    ECHOCARDIOGRAM REPORT   Patient Name:   Susan Sosa Date of Exam: 10/17/2023 Medical Rec #:  [id number]    Height:       59.0 in Accession #:    [id number]   Weight:       132.0 lb Date of Birth:  1940-12-17     BSA:          1.546 m Patient Age:    83 years     BP:           140/76 mmHg Patient Gender: F            HR:           60 bpm. Exam Location:  [facility] Procedure: 2D Echo, Cardiac Doppler and Color Doppler (Both Spectral and Color            Flow Doppler were utilized during procedure). Indications:     Cardiomyopathy-Ischemic I25.5                  Congestive Heart Failure I50.9  History:         Patient has prior history of Echocardiogram examinations, most                  recent 11/10/2022.  Sonographer:     Ashley McNeely-Sloane Referring Phys:  [id number] FUAD ALESKEROV  Diagnosing Phys: Keller Paterson IMPRESSIONS  1. Left ventricular ejection fraction, by estimation, is 55 to 60%. The left ventricle has normal function. The left ventricle has no regional wall motion abnormalities. There is mild left ventricular hypertrophy. Left ventricular diastolic parameters are consistent with Grade I diastolic dysfunction (impaired relaxation).  2. Right ventricular systolic function is mildly reduced. The right ventricular size is mildly enlarged.  3. The mitral valve is normal in structure. Trivial mitral valve regurgitation.  4. The aortic valve is tricuspid. Aortic valve regurgitation is not visualized.  5. The inferior vena cava is normal in size with greater than 50% respiratory variability, suggesting right atrial pressure of 3 mmHg. FINDINGS  Left Ventricle: Left ventricular ejection fraction, by estimation, is 55 to 60%. The left ventricle has normal function. The left ventricle has no regional wall motion abnormalities. The left ventricular internal cavity size was normal in size. There is  mild left ventricular hypertrophy. Left ventricular diastolic parameters are consistent with Grade I diastolic dysfunction (impaired relaxation). Right Ventricle: The right ventricular size is mildly enlarged. No increase in right ventricular wall thickness. Right ventricular systolic function is mildly reduced. Left Atrium: Left atrial size was normal in size. Right Atrium: Right atrial size was normal in size. Pericardium: There is no evidence of pericardial effusion. Mitral Valve: The mitral valve is normal in structure. Mild mitral annular calcification. Trivial mitral valve regurgitation. MV peak gradient, 5.7 mmHg. The mean mitral valve gradient is 2.0 mmHg. Tricuspid Valve: The tricuspid valve is normal in structure. Tricuspid valve regurgitation is trivial. Aortic Valve: The aortic valve is tricuspid. Aortic valve regurgitation is not visualized. Aortic valve mean gradient measures 2.0  mmHg. Aortic valve peak gradient measures 3.3 mmHg. Aortic valve area, by VTI measures 1.45 cm. Pulmonic Valve:  The pulmonic valve was not well visualized. Pulmonic valve regurgitation is not visualized. Aorta: The aortic root and ascending aorta are structurally normal, with no evidence of dilitation. Venous: The inferior vena cava is normal in size with greater than 50% respiratory variability, suggesting right atrial pressure of 3 mmHg. IAS/Shunts: The atrial septum is grossly normal.  LEFT VENTRICLE PLAX 2D LVIDd:         3.50 cm     Diastology LVIDs:         1.90 cm     LV e' medial:    4.79 cm/s LV PW:         1.10 cm     LV E/e' medial:  10.7 LV IVS:        1.00 cm     LV e' lateral:   7.07 cm/s LVOT diam:     1.60 cm     LV E/e' lateral: 7.3 LV SV:         29 LV SV Index:   19 LVOT Area:     2.01 cm  LV Volumes (MOD) LV vol d, MOD A2C: 38.5 ml LV vol d, MOD A4C: 57.6 ml LV vol s, MOD A2C: 15.0 ml LV vol s, MOD A4C: 25.9 ml LV SV MOD A2C:     23.5 ml LV SV MOD A4C:     57.6 ml LV SV MOD BP:      28.7 ml RIGHT VENTRICLE RV Basal diam:  3.30 cm RV Mid diam:    3.85 cm RV S prime:     11.10 cm/s TAPSE (M-mode): 1.6 cm LEFT ATRIUM             Index        RIGHT ATRIUM          Index LA diam:        3.60 cm 2.33 cm/m   RA Area:     9.81 cm LA Vol (A2C):   25.6 ml 16.56 ml/m  RA Volume:   17.50 ml 11.32 ml/m LA Vol (A4C):   30.6 ml 19.80 ml/m LA Biplane Vol: 29.3 ml 18.95 ml/m  AORTIC VALVE                    PULMONIC VALVE AV Area (Vmax):    1.48 cm     PV Vmax:        1.37 m/s AV Area (Vmean):   1.45 cm     PV Vmean:       89.800 cm/s AV Area (VTI):     1.45 cm     PV VTI:         0.274 m AV Vmax:           90.80 cm/s   PV Peak grad:   7.5 mmHg AV Vmean:          63.350 cm/s  PV Mean grad:   4.0 mmHg AV VTI:            0.200 m      RVOT Peak grad: 6 mmHg AV Peak Grad:      3.3 mmHg AV Mean Grad:      2.0 mmHg LVOT Vmax:         66.80 cm/s LVOT Vmean:        45.550 cm/s LVOT VTI:          0.144 m  LVOT/AV VTI ratio: 0.72  AORTA Ao Root diam: 3.00 cm Ao Asc  diam:  2.40 cm MITRAL VALVE MV Area (PHT): 3.42 cm     SHUNTS MV Area VTI:   1.43 cm     Systemic VTI:  0.14 m MV Peak grad:  5.7 mmHg     Systemic Diam: 1.60 cm MV Mean grad:  2.0 mmHg     Pulmonic VTI:  0.244 m MV Vmax:       1.19 m/s MV Vmean:      57.8 cm/s MV Decel Time: 222 msec MV E velocity: 51.40 cm/s MV A velocity: 104.00 cm/s MV E/A ratio:  0.49 Keller Paterson Electronically signed by Keller Paterson Signature Date/Time: 10/17/2023/12:38:03 PM    Final    DG Chest Portable 1 View Result Date: 10/14/2023 CLINICAL DATA:  Shortness of breath. EXAM: PORTABLE CHEST 1 VIEW COMPARISON:  Chest x-ray 11/08/2022, chest CT 10/12/2023 FINDINGS: Lungs are somewhat hypoinflated with chronic changes over the lung bases compatible recent CT findings of pulmonary fibrosis. No acute airspace process. No effusion. Cardiomediastinal silhouette and remainder of the exam is unchanged. IMPRESSION: Hypoinflation with chronic changes over the lung bases compatible with recent CT findings of pulmonary fibrosis. Electronically Signed   By: Toribio Agreste M.D.   On: 10/14/2023 17:42   CT CHEST WO CONTRAST Result Date: 10/12/2023 CLINICAL DATA:  Interstitial pulmonary disease EXAM: CT CHEST WITHOUT CONTRAST TECHNIQUE: Multidetector CT imaging of the chest was performed following the standard protocol without IV contrast. RADIATION DOSE REDUCTION: This exam was performed according to the departmental dose-optimization program which includes automated exposure control, adjustment of the mA and/or kV according to patient size and/or use of iterative reconstruction technique. COMPARISON:  04/12/2023 04/11/2018 FINDINGS: Cardiovascular: Aortic atherosclerosis. Cardiomegaly. Enlargement of the main pulmonary artery measuring up to 3.5 cm in caliber. No pericardial effusion. Mediastinum/Nodes: Unchanged prominent mediastinal lymph nodes (series 2, image 52). Thyroid gland,  trachea, and esophagus demonstrate no significant findings. Lungs/Pleura: Slight interval worsening of mild to moderate pulmonary fibrosis in a pattern with apical to basal gradient featuring irregular peripheral interstitial opacity, septal thickening, traction bronchiectasis, subpleural bronchiolectasis, and small areas of honeycombing particularly in the right lung base. Fibrotic findings are much more clearly worsened over a longer period of time dating back to 04/11/2018. No significant air trapping on expiratory phase imaging. Moderate underlying emphysema and diffuse bilateral bronchial wall thickening. Upper Abdomen: No acute abnormality. Musculoskeletal: No chest wall abnormality. No acute osseous findings. IMPRESSION: 1. Slight interval worsening of mild to moderate pulmonary fibrosis in a pattern with apical to basal gradient featuring irregular peripheral interstitial opacity, septal thickening, traction bronchiectasis, subpleural bronchiolectasis, and small areas of honeycombing particularly in the right lung base. Fibrotic findings are much more clearly worsened over a longer period of time dating back to 04/11/2018. Given features and progression, findings are consistent with a UIP pattern by ATS criteria. Findings are consistent with UIP per consensus guidelines: Diagnosis of Idiopathic Pulmonary Fibrosis: An Official ATS/ERS/JRS/ALAT Clinical Practice Guideline. Am JINNY Honey Crit Care Med Vol 198, Iss 5, [phone], Sep 25 2016. 2. Moderate underlying emphysema and diffuse bilateral bronchial wall thickening. 3. Cardiomegaly. 4. Enlargement of the main pulmonary artery, as can be seen in pulmonary hypertension. Aortic Atherosclerosis (ICD10-I70.0) and Emphysema (ICD10-J43.9). Electronically Signed   By: Marolyn JONETTA Jaksch M.D.   On: 10/12/2023 14:36    Microbiology: Results for orders placed or performed during the hospital encounter of 10/14/23  SARS Coronavirus 2 by RT PCR (hospital order, performed  in [facility] hospital lab) *cepheid single result test*  Anterior Nasal Swab     Status: None   Collection Time: 10/15/23 10:11 AM   Specimen: Anterior Nasal Swab  Result Value Ref Range Status   SARS Coronavirus 2 by RT PCR NEGATIVE NEGATIVE Final    Comment: (NOTE) SARS-CoV-2 target nucleic acids are NOT DETECTED.  The SARS-CoV-2 RNA is generally detectable in upper and lower respiratory specimens during the acute phase of infection. The lowest concentration of SARS-CoV-2 viral copies this assay can detect is 250 copies / mL. A negative result does not preclude SARS-CoV-2 infection and should not be used as the sole basis for treatment or other patient management decisions.  A negative result may occur with improper specimen collection / handling, submission of specimen other than nasopharyngeal swab, presence of viral mutation(s) within the areas targeted by this assay, and inadequate number of viral copies (<250 copies / mL). A negative result must be combined with clinical observations, patient history, and epidemiological information.  Fact Sheet for Patients:   RoadLapTop.co.za  Fact Sheet for Healthcare Providers: http://kim-miller.com/  This test is not yet approved or  cleared by the [place]  FDA and has been authorized for detection and/or diagnosis of SARS-CoV-2 by FDA under an Emergency Use Authorization (EUA).  This EUA will remain in effect (meaning this test can be used) for the duration of the COVID-19 declaration under Section 564(b)(1) of the Act, 21 U.S.C. section 360bbb-3(b)(1), unless the authorization is terminated or revoked sooner.  Performed at [facility], [address] [place], [place] [address]   Respiratory (~20 pathogens) panel by PCR     Status: None   Collection Time: 10/15/23 10:11 AM   Specimen: Nasopharyngeal Swab; Respiratory  Result Value Ref Range Status   Adenovirus NOT  DETECTED NOT DETECTED Final   Coronavirus 229E NOT DETECTED NOT DETECTED Final    Comment: (NOTE) The Coronavirus on the Respiratory Panel, DOES NOT test for the novel  Coronavirus (2019 nCoV)    Coronavirus HKU1 NOT DETECTED NOT DETECTED Final   Coronavirus NL63 NOT DETECTED NOT DETECTED Final   Coronavirus OC43 NOT DETECTED NOT DETECTED Final   Metapneumovirus NOT DETECTED NOT DETECTED Final   Rhinovirus / Enterovirus NOT DETECTED NOT DETECTED Final   Influenza A NOT DETECTED NOT DETECTED Final   Influenza B NOT DETECTED NOT DETECTED Final   Parainfluenza Virus 1 NOT DETECTED NOT DETECTED Final   Parainfluenza Virus 2 NOT DETECTED NOT DETECTED Final   Parainfluenza Virus 3 NOT DETECTED NOT DETECTED Final   Parainfluenza Virus 4 NOT DETECTED NOT DETECTED Final   Respiratory Syncytial Virus NOT DETECTED NOT DETECTED Final   Bordetella pertussis NOT DETECTED NOT DETECTED Final   Bordetella Parapertussis NOT DETECTED NOT DETECTED Final   Chlamydophila pneumoniae NOT DETECTED NOT DETECTED Final   Mycoplasma pneumoniae NOT DETECTED NOT DETECTED Final    Comment: Performed at [facility], [address] [place], [place] [address]    Labs: CBC: Recent Labs  Lab 10/14/23 1646 10/14/23 1941 10/15/23 0706  WBC 14.3* 10.9* 10.5  NEUTROABS 11.8*  --   --   HGB 13.8 13.5 13.2  HCT 40.6 40.3 39.6  MCV 88.6 88.4 88.8  PLT 301 261 267   Basic Metabolic Panel: Recent Labs  Lab 10/14/23 1646 10/14/23 1941 10/15/23 0706 10/16/23 0613 10/18/23 0440  NA 137  --  139 135 137  K 4.5  --  4.3 4.5 4.2  CL 99  --  101 96* 92*  CO2 25  --  26 26 28   GLUCOSE 96  --  114* 119* 154*  BUN 26*  --  26* 26* 32*  CREATININE 0.87 1.18* 0.79 0.76 1.04*  CALCIUM  8.7*  --  8.4* 8.7* 8.7*   Liver Function Tests: Recent Labs  Lab 10/15/23 0706  AST 20  ALT 20  ALKPHOS 48  BILITOT 0.6  PROT 6.4*  ALBUMIN 3.3*   CBG: No results for input(s): GLUCAP in the last 168  hours.  Discharge time spent: greater than 30 minutes.  Signed: Laree Lock, MD [facility] 10/18/2023

## 2023-10-18 NOTE — TOC Initial Note (Signed)
 Transition of Care [facility]) - Initial/Assessment Note    Patient Details  Name: Susan Sosa MRN: [id number] Date of Birth: December 10, 1940  [facility] CM/SW Contact:    Corean ONEIDA Haddock, RN Phone Number: 10/18/2023, 2:41 PM  Clinical Narrative:                  Met with patient and spouse at bedside.   Patient to discharge today Therapy has recommending HH and RW  Patient confirms that she already has a RW.  Patient in agreement to [facility] services.   States she does not have a preference of HH agency. Referral made and accepted by Burnard with [facility]  Husband to transport at discharge, and has her home portable [employer] o2 for discharge        Patient Goals and CMS Choice            Expected Discharge Plan and Services         Expected Discharge Date: 10/18/23                                    Prior Living Arrangements/Services                       Activities of Daily Living   ADL Screening (condition at time of admission) Independently performs ADLs?: Yes (appropriate for developmental age) Is the patient deaf or have difficulty hearing?: No Does the patient have difficulty seeing, even when wearing glasses/contacts?: No Does the patient have difficulty concentrating, remembering, or making decisions?: No  Permission Sought/Granted                  Emotional Assessment              Admission diagnosis:  Shortness of breath [R06.02] Pulmonary fibrosis (HCC) [J84.10] Acute on chronic respiratory failure with hypoxemia (HCC) [J96.21] Patient Active Problem List   Diagnosis Date Noted   Acute on chronic respiratory failure with hypoxemia (HCC) 10/14/2023   Fall 05/24/2022   NICM (nonischemic cardiomyopathy) (HCC) 05/24/2022   Forehead laceration, initial encounter 05/24/2022   Facial trauma 05/24/2022   NSTEMI (non-ST elevated myocardial infarction) (HCC) 11/26/2021   Chronic respiratory failure with hypoxia (HCC)  11/26/2021   Chronic hyponatremia 11/26/2021   Degeneration of lumbosacral intervertebral disc 08/04/2020   Full thickness rotator cuff tear 08/04/2020   Osteoarthritis of knee 08/04/2020   Systolic murmur 09/04/2019   Idiopathic pulmonary fibrosis (HCC) 02/26/2019   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   Intermediate stage nonexudative age-related macular degeneration of both eyes 10/21/2017   Posterior vitreous detachment of left eye 10/21/2017   Presence of intraocular lens 10/21/2017   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   Hypomagnesemia 09/07/2017   Prediabetes 04/18/2016   Bilateral renal cysts 07/18/2014   Benign essential hypertension 07/18/2014   Paroxysmal atrial tachycardia 07/18/2014   Dyspnea 08/10/2011   Hypercholesterolemia 08/10/2011   PCP:  Alla Amis, MD Pharmacy:   [facility] [phone] [place], [place] - [address] [place] [address] Phone: [phone] Fax: [phone]  [facility] [address] [place] [address] Phone: [phone] Fax: [phone]     Social Drivers of Health (SDOH) Social History: SDOH Screenings   Food Insecurity: No Food Insecurity (10/14/2023)  Housing: Low Risk  (10/14/2023)  Transportation Needs: No Transportation Needs (10/14/2023)  Utilities: Not At  Risk (10/14/2023)  Depression (PHQ2-9): Medium Risk (12/23/2020)  Financial Resource Strain: Low Risk  (03/30/2023)   Received from [facility]  Social Connections: Socially Integrated (10/14/2023)  Tobacco Use: Medium Risk (10/14/2023)   SDOH Interventions:     Readmission Risk Interventions     No data to display

## 2023-10-18 NOTE — Plan of Care (Signed)

## 2023-10-18 NOTE — Progress Notes (Signed)
   10/18/23 1100  Oxygen Therapy  SpO2 96 %  O2 Device HFNC  O2 Flow Rate (L/min) 6 L/min  Mobility  Activity Ambulated independently;Stood at bedside  Level of Assistance Independent after set-up  Assistive Device None  Distance Ambulated (ft) 50 ft  Range of Motion/Exercises All extremities  Activity Response Tolerated well  Mobility visit 1 Mobility  Mobility Specialist Start Time (ACUTE ONLY) 1018  Mobility Specialist Stop Time (ACUTE ONLY) 1046  Mobility Specialist Time Calculation (min) (ACUTE ONLY) 28 min   Mobility Specialist - Progress Note Pt was supine in the bed upon room entry. Pt agreed to mobility. Pt was able to EOB independently. Pt was also able to STS independently with O2 @ 6L. At EOB pt O2 levels were 77%. At STS pt O2 levels were at 86%. Once pt O2 revered pt was able to proceed with activity. Pt ambulated well. After activity pt was able to EOB to take O2 reading. O2 was at 86%. Pt recovered to 93% after a two min. Pt was able to get back in bed with needs in reach.  Clem Rodes Mobility Specialist 10/18/23, 11:24 AM

## 2023-11-09 ENCOUNTER — Emergency Department

## 2023-11-09 ENCOUNTER — Encounter: Payer: Self-pay | Admitting: Emergency Medicine

## 2023-11-09 ENCOUNTER — Inpatient Hospital Stay
Admission: EM | Admit: 2023-11-09 | Discharge: 2023-11-18 | DRG: 189 | Disposition: A | Discharge status: Home / Self Care | Source: Ambulatory Visit | Attending: Student | Admitting: Student

## 2023-11-09 ENCOUNTER — Other Ambulatory Visit: Payer: Self-pay

## 2023-11-09 DIAGNOSIS — Z91040 Latex allergy status: Secondary | ICD-10-CM

## 2023-11-09 DIAGNOSIS — I130 Hypertensive heart and chronic kidney disease with heart failure and stage 1 through stage 4 chronic kidney disease, or unspecified chronic kidney disease: Secondary | ICD-10-CM | POA: Diagnosis present

## 2023-11-09 DIAGNOSIS — E7800 Pure hypercholesterolemia, unspecified: Secondary | ICD-10-CM | POA: Diagnosis present

## 2023-11-09 DIAGNOSIS — I2723 Pulmonary hypertension due to lung diseases and hypoxia: Secondary | ICD-10-CM | POA: Diagnosis present

## 2023-11-09 DIAGNOSIS — Z7951 Long term (current) use of inhaled steroids: Secondary | ICD-10-CM | POA: Diagnosis not present

## 2023-11-09 DIAGNOSIS — Z888 Allergy status to other drugs, medicaments and biological substances status: Secondary | ICD-10-CM

## 2023-11-09 DIAGNOSIS — E559 Vitamin D deficiency, unspecified: Secondary | ICD-10-CM | POA: Diagnosis present

## 2023-11-09 DIAGNOSIS — I5033 Acute on chronic diastolic (congestive) heart failure: Secondary | ICD-10-CM | POA: Diagnosis present

## 2023-11-09 DIAGNOSIS — J440 Chronic obstructive pulmonary disease with acute lower respiratory infection: Secondary | ICD-10-CM | POA: Diagnosis not present

## 2023-11-09 DIAGNOSIS — I2489 Other forms of acute ischemic heart disease: Secondary | ICD-10-CM | POA: Diagnosis present

## 2023-11-09 DIAGNOSIS — N1831 Chronic kidney disease, stage 3a: Secondary | ICD-10-CM | POA: Diagnosis present

## 2023-11-09 DIAGNOSIS — L89812 Pressure ulcer of head, stage 2: Secondary | ICD-10-CM | POA: Diagnosis not present

## 2023-11-09 DIAGNOSIS — J432 Centrilobular emphysema: Secondary | ICD-10-CM | POA: Diagnosis present

## 2023-11-09 DIAGNOSIS — E876 Hypokalemia: Secondary | ICD-10-CM | POA: Diagnosis not present

## 2023-11-09 DIAGNOSIS — Z66 Do not resuscitate: Secondary | ICD-10-CM | POA: Diagnosis present

## 2023-11-09 DIAGNOSIS — J9621 Acute and chronic respiratory failure with hypoxia: Principal | ICD-10-CM | POA: Diagnosis present

## 2023-11-09 DIAGNOSIS — J8410 Pulmonary fibrosis, unspecified: Secondary | ICD-10-CM

## 2023-11-09 DIAGNOSIS — I428 Other cardiomyopathies: Secondary | ICD-10-CM | POA: Diagnosis present

## 2023-11-09 DIAGNOSIS — Z823 Family history of stroke: Secondary | ICD-10-CM

## 2023-11-09 DIAGNOSIS — R000 Tachycardia, unspecified: Secondary | ICD-10-CM | POA: Diagnosis not present

## 2023-11-09 DIAGNOSIS — Z833 Family history of diabetes mellitus: Secondary | ICD-10-CM

## 2023-11-09 DIAGNOSIS — I4719 Other supraventricular tachycardia: Secondary | ICD-10-CM | POA: Diagnosis present

## 2023-11-09 DIAGNOSIS — J9620 Acute and chronic respiratory failure, unspecified whether with hypoxia or hypercapnia: Secondary | ICD-10-CM | POA: Diagnosis not present

## 2023-11-09 DIAGNOSIS — Z79899 Other long term (current) drug therapy: Secondary | ICD-10-CM

## 2023-11-09 DIAGNOSIS — N179 Acute kidney failure, unspecified: Secondary | ICD-10-CM | POA: Diagnosis present

## 2023-11-09 DIAGNOSIS — Z7982 Long term (current) use of aspirin: Secondary | ICD-10-CM | POA: Diagnosis not present

## 2023-11-09 DIAGNOSIS — Z8249 Family history of ischemic heart disease and other diseases of the circulatory system: Secondary | ICD-10-CM

## 2023-11-09 DIAGNOSIS — J84112 Idiopathic pulmonary fibrosis: Secondary | ICD-10-CM | POA: Diagnosis present

## 2023-11-09 DIAGNOSIS — Z1152 Encounter for screening for COVID-19: Secondary | ICD-10-CM

## 2023-11-09 DIAGNOSIS — R7989 Other specified abnormal findings of blood chemistry: Secondary | ICD-10-CM

## 2023-11-09 DIAGNOSIS — D849 Immunodeficiency, unspecified: Secondary | ICD-10-CM | POA: Diagnosis present

## 2023-11-09 DIAGNOSIS — K219 Gastro-esophageal reflux disease without esophagitis: Secondary | ICD-10-CM | POA: Diagnosis present

## 2023-11-09 DIAGNOSIS — J189 Pneumonia, unspecified organism: Secondary | ICD-10-CM | POA: Diagnosis not present

## 2023-11-09 DIAGNOSIS — Z9981 Dependence on supplemental oxygen: Secondary | ICD-10-CM

## 2023-11-09 DIAGNOSIS — Z885 Allergy status to narcotic agent status: Secondary | ICD-10-CM

## 2023-11-09 DIAGNOSIS — I10 Essential (primary) hypertension: Secondary | ICD-10-CM | POA: Diagnosis not present

## 2023-11-09 DIAGNOSIS — Z87891 Personal history of nicotine dependence: Secondary | ICD-10-CM

## 2023-11-09 DIAGNOSIS — J9601 Acute respiratory failure with hypoxia: Principal | ICD-10-CM

## 2023-11-09 DIAGNOSIS — J9622 Acute and chronic respiratory failure with hypercapnia: Secondary | ICD-10-CM | POA: Diagnosis present

## 2023-11-09 DIAGNOSIS — J9811 Atelectasis: Secondary | ICD-10-CM | POA: Diagnosis not present

## 2023-11-09 DIAGNOSIS — R0600 Dyspnea, unspecified: Secondary | ICD-10-CM | POA: Diagnosis present

## 2023-11-09 LAB — COMPREHENSIVE METABOLIC PANEL WITH GFR
ALT: 16 U/L (ref 0–44)
AST: 24 U/L (ref 15–41)
Albumin: 3.3 g/dL — ABNORMAL LOW (ref 3.5–5.0)
Alkaline Phosphatase: 54 U/L (ref 38–126)
Anion gap: 16 — ABNORMAL HIGH (ref 5–15)
BUN: 20 mg/dL (ref 8–23)
CO2: 25 mmol/L (ref 22–32)
Calcium: 8.5 mg/dL — ABNORMAL LOW (ref 8.9–10.3)
Chloride: 93 mmol/L — ABNORMAL LOW (ref 98–111)
Creatinine, Ser: 1.31 mg/dL — ABNORMAL HIGH (ref 0.44–1.00)
GFR, Estimated: 40 mL/min — ABNORMAL LOW (ref >60)
Glucose, Bld: 174 mg/dL — ABNORMAL HIGH (ref 70–99)
Potassium: 4.1 mmol/L (ref 3.5–5.1)
Sodium: 134 mmol/L — ABNORMAL LOW (ref 135–145)
Total Bilirubin: 1 mg/dL (ref 0.0–1.2)
Total Protein: 7.1 g/dL (ref 6.5–8.1)

## 2023-11-09 LAB — BLOOD GAS, VENOUS
Acid-Base Excess: 4.2 mmol/L — ABNORMAL HIGH (ref 0.0–2.0)
Bicarbonate: 29.2 mmol/L — ABNORMAL HIGH (ref 20.0–28.0)
O2 Saturation: 27.1 %
Patient temperature: 37
pCO2, Ven: 44 mmHg (ref 44–60)
pH, Ven: 7.43 (ref 7.25–7.43)

## 2023-11-09 LAB — CBC
HCT: 38.1 % (ref 36.0–46.0)
Hemoglobin: 12.9 g/dL (ref 12.0–15.0)
MCH: 30.5 pg (ref 26.0–34.0)
MCHC: 33.9 g/dL (ref 30.0–36.0)
MCV: 90.1 fL (ref 80.0–100.0)
Platelets: 259 K/uL (ref 150–400)
RBC: 4.23 MIL/uL (ref 3.87–5.11)
RDW: 14.6 % (ref 11.5–15.5)
WBC: 9.8 K/uL (ref 4.0–10.5)
nRBC: 0 % (ref 0.0–0.2)

## 2023-11-09 LAB — PROTIME-INR
INR: 1.2 (ref 0.8–1.2)
Prothrombin Time: 15.4 s — ABNORMAL HIGH (ref 11.4–15.2)

## 2023-11-09 LAB — APTT: aPTT: 56 s — ABNORMAL HIGH (ref 24–36)

## 2023-11-09 LAB — LACTIC ACID, PLASMA
Lactic Acid, Venous: 1.9 mmol/L (ref 0.5–1.9)
Lactic Acid, Venous: 1.7 mmol/L (ref 0.5–1.9)

## 2023-11-09 LAB — TROPONIN I (HIGH SENSITIVITY)
Troponin I (High Sensitivity): 120 ng/L (ref <18)
Troponin I (High Sensitivity): 76 ng/L — ABNORMAL HIGH (ref <18)

## 2023-11-09 LAB — MRSA NEXT GEN BY PCR, NASAL: MRSA by PCR Next Gen: NOT DETECTED

## 2023-11-09 LAB — GLUCOSE, CAPILLARY: Glucose-Capillary: 186 mg/dL — ABNORMAL HIGH (ref 70–99)

## 2023-11-09 LAB — BRAIN NATRIURETIC PEPTIDE: B Natriuretic Peptide: 1343.7 pg/mL — ABNORMAL HIGH (ref 0.0–100.0)

## 2023-11-09 MED ORDER — ACETAMINOPHEN 325 MG PO TABS
650.0000 mg | ORAL_TABLET | Freq: Four times a day (QID) | ORAL | Status: AC | PRN
  Administered 2023-11-13: 650 mg via ORAL
  Filled 2023-11-09: qty 2

## 2023-11-09 MED ORDER — HEPARIN (PORCINE) 25000 UT/250ML-% IV SOLN
950.0000 [IU]/h | INTRAVENOUS | Status: DC
  Administered 2023-11-09: 650 [IU]/h via INTRAVENOUS
  Filled 2023-11-09: qty 250

## 2023-11-09 MED ORDER — NITROGLYCERIN 0.4 MG SL SUBL
0.4000 mg | SUBLINGUAL_TABLET | SUBLINGUAL | Status: DC | PRN
Start: 2023-11-09 — End: 2023-11-18

## 2023-11-09 MED ORDER — PANTOPRAZOLE SODIUM 40 MG PO TBEC: 80.0000 mg | DELAYED_RELEASE_TABLET | Freq: Every day | ORAL | Status: DC

## 2023-11-09 MED ORDER — ASPIRIN 81 MG PO CHEW
81.0000 mg | CHEWABLE_TABLET | Freq: Every evening | ORAL | Status: DC
  Administered 2023-11-10 – 2023-11-17 (×8): 81 mg via ORAL
  Filled 2023-11-09 (×8): qty 1

## 2023-11-09 MED ORDER — CHLORHEXIDINE GLUCONATE CLOTH 2 % EX PADS
6.0000 | MEDICATED_PAD | Freq: Every day | CUTANEOUS | Status: DC
  Administered 2023-11-10 – 2023-11-18 (×9): 6 via TOPICAL

## 2023-11-09 MED ORDER — ONDANSETRON HCL 4 MG PO TABS: 4.0000 mg | ORAL_TABLET | Freq: Four times a day (QID) | ORAL | Status: AC | PRN

## 2023-11-09 MED ORDER — IPRATROPIUM-ALBUTEROL 0.5-2.5 (3) MG/3ML IN SOLN
3.0000 mL | Freq: Four times a day (QID) | RESPIRATORY_TRACT | Status: AC
  Administered 2023-11-09 – 2023-11-10 (×3): 3 mL via RESPIRATORY_TRACT
  Filled 2023-11-09 (×3): qty 3

## 2023-11-09 MED ORDER — METHYLPREDNISOLONE SODIUM SUCC 125 MG IJ SOLR
125.0000 mg | Freq: Once | INTRAMUSCULAR | Status: AC
  Administered 2023-11-09: 125 mg via INTRAVENOUS
  Filled 2023-11-09: qty 2

## 2023-11-09 MED ORDER — HYDRALAZINE HCL 20 MG/ML IJ SOLN: 5.0000 mg | Freq: Four times a day (QID) | INTRAMUSCULAR | Status: AC | PRN

## 2023-11-09 MED ORDER — METHYLPREDNISOLONE SODIUM SUCC 125 MG IJ SOLR: 80.0000 mg | Freq: Every day | INTRAMUSCULAR | Status: DC

## 2023-11-09 MED ORDER — ACETAMINOPHEN 650 MG RE SUPP: 650.0000 mg | Freq: Four times a day (QID) | RECTAL | Status: AC | PRN

## 2023-11-09 MED ORDER — SENNOSIDES-DOCUSATE SODIUM 8.6-50 MG PO TABS: 1.0000 | ORAL_TABLET | Freq: Every evening | ORAL | Status: DC | PRN

## 2023-11-09 MED ORDER — ONDANSETRON HCL 4 MG/2ML IJ SOLN: 4.0000 mg | Freq: Four times a day (QID) | INTRAMUSCULAR | Status: AC | PRN

## 2023-11-09 MED ORDER — IPRATROPIUM-ALBUTEROL 0.5-2.5 (3) MG/3ML IN SOLN
3.0000 mL | Freq: Once | RESPIRATORY_TRACT | Status: AC
  Administered 2023-11-09: 3 mL via RESPIRATORY_TRACT
  Filled 2023-11-09: qty 3

## 2023-11-09 MED ORDER — FLUTICASONE FUROATE-VILANTEROL 200-25 MCG/ACT IN AEPB
1.0000 | INHALATION_SPRAY | Freq: Every day | RESPIRATORY_TRACT | Status: DC
  Filled 2023-11-09: qty 28

## 2023-11-09 MED ORDER — LORAZEPAM 2 MG/ML IJ SOLN
0.5000 mg | Freq: Four times a day (QID) | INTRAMUSCULAR | Status: DC | PRN
  Administered 2023-11-12: 0.5 mg via INTRAVENOUS
  Filled 2023-11-09: qty 1

## 2023-11-09 MED ORDER — PREDNISONE 20 MG PO TABS: 20.0000 mg | ORAL_TABLET | Freq: Every day | ORAL | Status: DC

## 2023-11-09 MED ORDER — HEPARIN BOLUS VIA INFUSION
3000.0000 [IU] | Freq: Once | INTRAVENOUS | Status: AC
  Administered 2023-11-09: 3000 [IU] via INTRAVENOUS
  Filled 2023-11-09: qty 3000

## 2023-11-09 NOTE — Assessment & Plan Note (Addendum)
 Patient saturation was 50% on 8 L nasal cannula at [facility] clinic and was placed on high flow nasal cannula with mild improvement. VBG showed mild hypercapnia Continue BiPAP therapy, admit to stepdown Follow a second high sensitive troponin Pneumonia was considered however in the absence of infectious signs including fever, white cell count elevation, increased lactic acid, I do not suspect pneumonia at this time DuoNebs 4 times daily, 3 doses ordered Solu-Medrol  80 mg IV one-time dose ordered for 10/16 followed by prednisone  20 mg daily starting on 10/17, for 3 days Continuous pulse oximetry

## 2023-11-09 NOTE — ED Provider Notes (Addendum)
 [facility] Provider Note    Event Date/Time   First MD Initiated Contact with Patient 11/09/23 1330     (approximate)   History   Respiratory Distress   HPI  Jeanett Antonopoulos is a 83 y.o. female with a history of pulmonary fibrosis, pulmonary hypertension on 5 to 6 L nasal cannula who presents with hypoxia, shortness of breath.  Sent from pulmonologist office for hypoxia.  Patient has been feeling short of breath over the last 3 days     Physical Exam   Triage Vital Signs: ED Triage Vitals  Encounter Vitals Group     BP 11/09/23 1329 (!) 165/86     Girls Systolic BP Percentile --      Girls Diastolic BP Percentile --      Boys Systolic BP Percentile --      Boys Diastolic BP Percentile --      Pulse Rate 11/09/23 1329 (!) 122     Resp 11/09/23 1329 (!) 40     Temp 11/09/23 1329 97.7 F (36.5 C)     Temp Source 11/09/23 1329 Axillary     SpO2 11/09/23 1329 97 %     Weight 11/09/23 1327 59 kg (130 lb 1.1 oz)     Height 11/09/23 1327 1.499 m (4' 11)     Head Circumference --      Peak Flow --      Pain Score 11/09/23 1327 0     Pain Loc --      Pain Education --      Exclude from Growth Chart --     Most recent vital signs: Vitals:   11/09/23 1329 11/09/23 1340  BP: (!) 165/86   Pulse: (!) 122   Resp: (!) 40   Temp: 97.7 F (36.5 C)   SpO2: 97% 98%     General: Awake, moderate distress CV:  Good peripheral perfusion.  Tachycardia Resp:  Increased respiratory effort with severe tachypnea, scattered wheezes Abd:  No distention.  Other:  Mild lower extremity edema   ED Results / Procedures / Treatments   Labs (all labs ordered are listed, but only abnormal results are displayed) Labs Reviewed  BLOOD GAS, VENOUS - Abnormal; Notable for the following components:      Result Value   Bicarbonate 29.2 (*)    Acid-Base Excess 4.2 (*)    All other components within normal limits  CULTURE, BLOOD (ROUTINE X 2)  CULTURE, BLOOD  (ROUTINE X 2)  CBC  LACTIC ACID, PLASMA  COMPREHENSIVE METABOLIC PANEL WITH GFR  BRAIN NATRIURETIC PEPTIDE  LACTIC ACID, PLASMA  TROPONIN I (HIGH SENSITIVITY)     EKG  ED ECG REPORT I, Lamar Price, the attending physician, personally viewed and interpreted this ECG.  Date: 11/09/2023  Rhythm: normal sinus rhythm QRS Axis: normal Intervals: normal ST/T Wave abnormalities: normal Narrative Interpretation: no evidence of acute ischemia    RADIOLOGY Chest x-ray viewed interpret by me, history of pulmonary fibrosis, no significant change from prior    PROCEDURES:  Critical Care performed: yes  CRITICAL CARE Performed by: Lamar Price   Total critical care time: 30 minutes  Critical care time was exclusive of separately billable procedures and treating other patients.  Critical care was necessary to treat or prevent imminent or life-threatening deterioration.  Critical care was time spent personally by me on the following activities: development of treatment plan with patient and/or surrogate as well as nursing, discussions with consultants, evaluation of patient's  response to treatment, examination of patient, obtaining history from patient or surrogate, ordering and performing treatments and interventions, ordering and review of laboratory studies, ordering and review of radiographic studies, pulse oximetry and re-evaluation of patient's condition.   Procedures   MEDICATIONS ORDERED IN ED: Medications  methylPREDNISolone  sodium succinate (SOLU-MEDROL ) 125 mg/2 mL injection 125 mg (125 mg Intravenous Given 11/09/23 1348)  ipratropium-albuterol  (DUONEB) 0.5-2.5 (3) MG/3ML nebulizer solution 3 mL (3 mLs Nebulization Given 11/09/23 1350)     IMPRESSION / MDM / ASSESSMENT AND PLAN / ED COURSE  I reviewed the triage vital signs and the nursing notes. Patient's presentation is most consistent with severe exacerbation of chronic illness.  Patient presents with  shortness of breath with hypoxia, reportedly oxygen saturation of 50% on 8 L nasal cannula at pulmonologist office.  Put on nonrebreather here initially with improvement, converted to BiPAP  I suspect exacerbation of pulmonary fibrosis, she does have scattered wheezing, will give DuoNebs, IV Solu-Medrol   Lab work demonstrates normal lactic acid, normal white blood cell count, she is afebrile.  ----------------------------------------- 2:31 PM on 11/09/2023 -----------------------------------------  Feeling improved on BiPAP  Will consult the hospitalist for admission  Troponin elevated, this is likely related to significant increase in work of breathing, she denies chest pain.     FINAL CLINICAL IMPRESSION(S) / ED DIAGNOSES   Final diagnoses:  Acute respiratory failure with hypoxia (HCC)  Pulmonary fibrosis (HCC)     Rx / DC Orders   ED Discharge Orders     None        Note:  This document was prepared using Dragon voice recognition software and may include unintentional dictation errors.   Arlander Charleston, MD 11/09/23 1432    Arlander Charleston, MD 11/09/23 [phone]

## 2023-11-09 NOTE — Assessment & Plan Note (Signed)
 On CKD stage IIIa Strict I's and O's Suspect secondary to cardiorenal BNP is in process

## 2023-11-09 NOTE — Assessment & Plan Note (Signed)
 Suspect secondary to acute on chronic hypoxic respiratory failure Lorazepam 0.5 mg IV every 6 hours as needed for anxiety, 2 doses ordered

## 2023-11-09 NOTE — Consult Note (Signed)
 PHARMACY - ANTICOAGULATION CONSULT NOTE  Pharmacy Consult for IV Heparin  Indication: chest pain/ACS  Patient Measurements: Height: 4' 11 (149.9 cm) Weight: 59 kg (130 lb 1.1 oz) IBW/kg (Calculated) : 43.2 HEPARIN  DW (KG): 55.5  Labs: Recent Labs    11/09/23 1343  HGB 12.9  HCT 38.1  PLT 259  CREATININE 1.31*  TROPONINIHS 120*    Estimated Creatinine Clearance: 24.5 mL/min (A) (by C-G formula based on SCr of 1.31 mg/dL (H)).   Medical History: Past Medical History:  Diagnosis Date   Arthritis    Heart murmur    Hyperlipidemia    Hypertension    Hyponatremia    IPF (idiopathic pulmonary fibrosis) (HCC)     Medications:  No apparent anticoagulation prior to admission per my chart review. Medication reconciliation is pending  Assessment: 83 y/o F with medical history as above presenting with respiratory distress. Troponin elevated. Pharmacy consulted to dose heparin  for suspected ACS.  Baseline aPTT and INR are pending. Baseline CBC within normal limits.  Goal of Therapy:  Heparin  level 0.3-0.7 units/ml Monitor platelets by anticoagulation protocol: Yes   Plan:  --Heparin  3000 unit IV bolus followed by continuous infusion at 650 units/hr --Check HL 8 hours from initiation --Daily CBC per protocol while on IV heparin   Marolyn KATHEE Mare 11/09/2023,3:07 PM

## 2023-11-09 NOTE — Assessment & Plan Note (Signed)
 Workup in progress Prior troponin level reviewed and generally have been negative except for 1 year ago when it was over 5000 Given patient's age and no identifiable etiology for this change in respiratory status, I have ordered heparin  per pharmacy Will follow the second high since troponin Echo on 10/17/2023: Estimated action fraction of 55 to 60%, grade 1 diastolic dysfunction was reviewed

## 2023-11-09 NOTE — Assessment & Plan Note (Signed)
 Hydralazine 5 mg IV every 6 hours as needed for SBP greater 165, 5 days ordered

## 2023-11-09 NOTE — Assessment & Plan Note (Signed)
 Per above, as needed Ativan for anxiety

## 2023-11-09 NOTE — ED Notes (Signed)
 At this time, this EDT and Alexia NT, helped this pt on the bedpan, pt then stated that she could not make a BM, we provided peri care, and changed pt into clean and dry briefs. Pt is currently resting in bed, call light within reach.

## 2023-11-09 NOTE — H&P (Addendum)
 History and Physical   Susan Sosa FMW:[id number] DOB: Mar 26, 1940 DOA: 11/09/2023  PCP: Alla Amis, MD  Outpatient Specialists: Dr. Parris, pulmonologist Patient coming from: [facility] clinic pulmonology  I have personally briefly reviewed patient's old medical records in [facility] EMR.  Chief Concern: Shortness of breath  HPI: Susan Sosa is an 83 year old female with history of pulmonary fibrosis with chronic hypoxemic respiratory failure, GERD,, hypertension, hyperlipidemia, who presented from [facility] clinic pulmonology clinic for chief concerns of acute on chronic respiratory failure requiring increased oxygen supplementation.  Vitals in the ED showed t 97.7, rr 29, hr 140, blood pressure 165/86, SpO2 98% on BiPAP.  Serum sodium is 134, potassium 4.1, chloride 93, bicarb 25, BUN of 20, serum creatinine 1.31, eGFR 40, nonfasting blood glucose 174, WBC 0.8, hemoglobin 12.9, platelets of 259.  Lactic acid is 1.9.  HS troponin is 120.  ED treatment: DuoNebs one-time treatment, Solu-Medrol  125 mg IV one-time dose. ------------------------------------ At bedside, patient was able to tell me her first and last name, age, location.  She reports she started getting shortness of breath about 3 days ago.  She reports swelling of her lower extremity that happened about 2 days ago.  She endorses cough that is nonproductive.  She denies nausea, vomiting, chest pain.  She denies trauma to her person she denies known sick contacts  Social history: She lives at home with her husband of 7 years that they have been together for 35 years.  She denies tobacco, EtOH, recreational drug use.  She is retired and previously worked as an Airline pilot for [employer].  ROS: Constitutional: no weight change, no fever ENT/Mouth: no sore throat, no rhinorrhea Eyes: no eye pain, no vision changes Cardiovascular: no chest pain, + dyspnea,  + edema, no palpitations Respiratory: + cough, no  sputum, no wheezing Gastrointestinal: no nausea, no vomiting, no diarrhea, no constipation Genitourinary: no urinary incontinence, no dysuria, no hematuria Musculoskeletal: no arthralgias, no myalgias Skin: no skin lesions, no pruritus, Neuro: + weakness, no loss of consciousness, no syncope Psych: no anxiety, no depression, no decrease appetite Heme/Lymph: no bruising, no bleeding  ED Course: Discussed with EDP, patient requiring hospitalization for chief concerns of acute on chronic respiratory failure requiring increased oxygen supplementation.  Assessment/Plan  Principal Problem:   Acute on chronic respiratory failure (HCC) Active Problems:   Elevated troponin   AKI (acute kidney injury)   Benign essential hypertension   Hypercholesterolemia   Idiopathic pulmonary fibrosis (HCC)   Dyspnea   Sinus tachycardia   Assessment and Plan:  * Acute on chronic respiratory failure (HCC) Patient saturation was 50% on 8 L nasal cannula at [facility] clinic and was placed on high flow nasal cannula with mild improvement. VBG showed mild hypercapnia Continue BiPAP therapy, admit to stepdown Follow a second high sensitive troponin Pneumonia was considered however in the absence of infectious signs including fever, white cell count elevation, increased lactic acid, I do not suspect pneumonia at this time DuoNebs 4 times daily, 3 doses ordered Solu-Medrol  80 mg IV one-time dose ordered for 10/16 followed by prednisone  20 mg daily starting on 10/17, for 3 days Continuous pulse oximetry  AKI (acute kidney injury) On CKD stage IIIa Strict I's and O's Suspect secondary to cardiorenal BNP is in process  Elevated troponin Workup in progress Prior troponin level reviewed and generally have been negative except for 1 year ago when it was over 5000 Given patient's age and no identifiable etiology for this change in respiratory status,  I have ordered heparin  per pharmacy Will follow the second  high since troponin Echo on 10/17/2023: Estimated action fraction of 55 to 60%, grade 1 diastolic dysfunction was reviewed  Benign essential hypertension Hydralazine  5 mg IV every 6 hours as needed for SBP greater 165, 5 days ordered  Sinus tachycardia Suspect secondary to acute on chronic hypoxic respiratory failure Lorazepam 0.5 mg IV every 6 hours as needed for anxiety, 2 doses ordered  Dyspnea Per above, as needed Ativan for anxiety  Chart reviewed.   DVT prophylaxis: Heparin  per pharmacy Code Status: dnr/dni and confirmed with spouse at bedside Diet: N.p.o. as patient is currently on BiPAP Family Communication: Updated spouse at bedside with patient's permission Disposition Plan: Pending clinical course Consults called: None at this time Admission status: Stepdown, inpatient  Past Medical History:  Diagnosis Date   Arthritis    Heart murmur    Hyperlipidemia    Hypertension    Hyponatremia    IPF (idiopathic pulmonary fibrosis) (HCC)    Past Surgical History:  Procedure Laterality Date   LEFT HEART CATH AND CORONARY ANGIOGRAPHY N/A 11/27/2021   Procedure: LEFT HEART CATH AND CORONARY ANGIOGRAPHY and possible pci and stent;  Surgeon: Florencio Cara BIRCH, MD;  Location: [facility] INVASIVE CV LAB;  Service: Cardiovascular;  Laterality: N/A;   RIGHT HEART CATH Right 08/10/2023   Procedure: RIGHT HEART CATH;  Surgeon: Florencio Cara BIRCH, MD;  Location: [facility] INVASIVE CV LAB;  Service: Cardiovascular;  Laterality: Right;   Social History:  reports that she has quit smoking. She has never used smokeless tobacco. She reports that she does not drink alcohol and does not use drugs.  Allergies  Allergen Reactions   Latex Itching and Rash   Lidocaine Swelling    Facial swelling from a dentist Facial swelling from a dentist Facial swelling from a dentist    Atorvastatin      Severe hip pain   Losartan  Swelling   Metronidazole Other (See Comments)    Sore joints Sore joints     Oxycodone  Nausea Only   Family History  Problem Relation Age of Onset   Stroke Mother    Coronary artery disease Mother    Coronary artery disease Father    Stroke Father    Diabetes Father    Family history: Family history reviewed and not pertinent.  Prior to Admission medications   Medication Sig Start Date End Date Taking? Authorizing Provider  acetylcysteine  (MUCOMYST ) 20 % nebulizer solution Take 4 mLs by nebulization 3 (three) times daily. 04/05/23 08/01/24  [provider]  albuterol  (PROVENTIL ) (2.5 MG/3ML) 0.083% nebulizer solution Take 2.5 mg by nebulization in the morning and at bedtime. 11/13/19   [provider]  ascorbic acid  (VITAMIN C) 500 MG tablet ascorbic acid  (vitamin C) 500 mg tablet   1  by oral route. Patient not taking: Reported on 08/29/2023 05/08/21   Lane Shope, MD  aspirin  81 MG chewable tablet Chew 81 mg by mouth every evening.    [provider]  atorvastatin  (LIPITOR) 40 MG tablet Take 40 mg by mouth daily. 06/27/20   [provider]  azathioprine (IMURAN) 100 MG tablet Take 100 mg by mouth daily.    [provider]  budesonide-formoterol (SYMBICORT) 160-4.5 MCG/ACT inhaler Inhale 2 puffs into the lungs 2 (two) times daily. 08/02/23 08/01/24  [provider]  Calcium  Polycarbophil (FIBER-CAPS PO) Take 1 tablet by mouth daily.    [provider]  cholecalciferol (VITAMIN D3) 25 MCG (1000 UNIT)  tablet Take 1,000 Units by mouth daily.    [provider]  cloNIDine  (CATAPRES  - DOSED IN MG/24 HR) 0.1 mg/24hr patch Place 1 patch onto the skin See admin instructions. Place 1 patch onto the skin every 5 days 08/02/23 08/01/24  [provider]  esomeprazole (NEXIUM) 40 MG capsule Take 40 mg by mouth at bedtime. 05/30/20   [provider]  losartan  (COZAAR ) 50 MG tablet Take 25 mg by mouth in the morning and at bedtime.    [provider]  Multiple Vitamins-Minerals  (PRESERVISION/LUTEIN) CAPS Take 1 capsule by mouth every evening. Patient not taking: Reported on 08/29/2023    [provider]  nitroGLYCERIN  (NITROSTAT ) 0.4 MG SL tablet Place 1 tablet (0.4 mg total) under the tongue every 5 (five) minutes as needed for chest pain. 11/28/21   Amin, Sumayya, MD  ondansetron  (ZOFRAN ) 8 MG tablet Take 8 mg by mouth every 12 (twelve) hours as needed for vomiting or nausea. 09/29/21   [provider]  OXYGEN Inhale 6 L into the lungs continuous.    [provider]  Pirfenidone  267 MG TABS Take 534 mg by mouth with breakfast, with lunch, and with evening meal. 11/21/19   [provider]  polyethylene glycol powder (GLYCOLAX /MIRALAX ) 17 GM/SCOOP powder 1 capful once a day , may use 2 per day if continued constipation Patient not taking: Reported on 10/14/2023 04/22/21   Gasper Devere ORN, PA-C  prednisoLONE  acetate (PRED FORTE ) 1 % ophthalmic suspension Place 1 drop into the left eye at bedtime. Patient not taking: Reported on 08/29/2023    [provider]  sodium chloride  (MURO 128) 5 % ophthalmic solution Place 1 drop into the right eye at bedtime. Patient not taking: Reported on 08/29/2023    [provider]  TYVASO  DPI MAINTENANCE KIT 16 MCG POWD Inhale 16 mcg into the lungs in the morning, at noon, in the evening, and at bedtime. 10/11/23   [provider]  verapamil  (CALAN -SR) 180 MG CR tablet Take 180 mg by mouth at bedtime. 07/21/20   [provider]   Physical Exam: Vitals:   11/09/23 1327 11/09/23 1329 11/09/23 1340  BP:  (!) 165/86   Pulse:  (!) 122   Resp:  (!) 40   Temp:  97.7 F (36.5 C)   TempSrc:  Axillary   SpO2:  97% 98%  Weight: 59 kg    Height: 4' 11 (1.499 m)     Constitutional: appears frail, cachectic, anxious Eyes: PERRL, lids and conjunctivae normal ENMT: Mucous membranes are moist.  Unable to assess posterior pharynx clear and dentition. Hearing appropriate Neck: normal,  supple, no masses, no thyromegaly Respiratory: clear to auscultation bilaterally, no wheezing, no crackles.  Increased respiratory effort.  Increased accessory muscle use.  BiPAP mask in place Cardiovascular: Regular rate and rhythm, no murmurs / rubs / gallops.  Bilateral lower extremity extremity trace edema. 2+ pedal pulses. No carotid bruits.  Abdomen: no tenderness, no masses palpated, no hepatosplenomegaly. Bowel sounds positive.  Musculoskeletal: no clubbing / cyanosis. No joint deformity upper and lower extremities. Good ROM, no contractures, no atrophy. Normal muscle tone.  Skin: no rashes, lesions, ulcers. No induration Neurologic: Sensation intact. Strength 5/5 in all 4.  Psychiatric: Normal judgment and insight. Alert and oriented x 3. Normal mood.   EKG: independently reviewed, showing sinus tachycardia with rate of 117, QTc 443  Chest x-ray on Admission: I personally reviewed and I agree with radiologist reading as below.  DG  Chest Port 1 View Result Date: 11/09/2023 EXAM: 1 VIEW XRAY OF THE CHEST 11/09/2023 02:13:46 PM COMPARISON: 10/14/2023. CLINICAL HISTORY: SOB, respiratory distress. FINDINGS: LUNGS AND PLEURA: Low lung volumes. Diffuse interstitial prominence, with basilar predominance, unchanged. Peripheral scarring in the right upper lobe unchanged. Emphysema noted. No pleural effusion. No pneumothorax. HEART AND MEDIASTINUM: Atherosclerotic descending thoracic aorta. Mild cardiomegaly. BONES AND SOFT TISSUES: No acute osseous abnormality. IMPRESSION: 1. Diffuse interstitial prominence with basilar predominance, unchanged. 2. Emphysema. 3. Mild cardiomegaly. 4. Peripheral scarring in the right upper lobe, unchanged. 5. Low lung volumes. 6. Atherosclerotic descending thoracic aorta. Electronically signed by: Ryan Salvage MD 11/09/2023 02:37 PM EDT RP Workstation: [id number]   Labs on Admission: I have personally reviewed following labs  CBC: Recent Labs  Lab  11/09/23 1343  WBC 9.8  HGB 12.9  HCT 38.1  MCV 90.1  PLT 259   Basic Metabolic Panel: Recent Labs  Lab 11/09/23 1343  NA 134*  K 4.1  CL 93*  CO2 25  GLUCOSE 174*  BUN 20  CREATININE 1.31*  CALCIUM  8.5*   GFR: Estimated Creatinine Clearance: 24.5 mL/min (A) (by C-G formula based on SCr of 1.31 mg/dL (H)).  Liver Function Tests: Recent Labs  Lab 11/09/23 1343  AST 24  ALT 16  ALKPHOS 54  BILITOT 1.0  PROT 7.1  ALBUMIN 3.3*   CRITICAL CARE Performed by: Dr. Sherre  Total critical care time: 35 minutes  Critical care time was exclusive of separately billable procedures and treating other patients.  Critical care was necessary to treat or prevent imminent or life-threatening deterioration.  Critical care was time spent personally by me on the following activities: development of treatment plan with patient and spouse well as nursing, discussions with consultants, evaluation of patient's response to treatment, examination of patient, obtaining history from patient or surrogate, ordering and performing treatments and interventions, ordering and review of laboratory studies, ordering and review of radiographic studies, pulse oximetry and re-evaluation of patient's condition.  This document was prepared using Dragon Voice Recognition software and may include unintentional dictation errors.  Dr. Sherre [facility]  If 7PM-7AM, please contact overnight-coverage provider If 7AM-7PM, please contact day attending provider [email or web address]  11/09/2023, 3:47 PM

## 2023-11-09 NOTE — Hospital Course (Addendum)
 Susan Sosa is an 83 year old female with history of pulmonary fibrosis with chronic hypoxemic respiratory failure, GERD,, hypertension, hyperlipidemia, who presented from [facility] clinic pulmonology clinic for chief concerns of acute on chronic respiratory failure requiring increased oxygen supplementation.  Vitals in the ED showed t 97.7, rr 29, hr 140, blood pressure 165/86, SpO2 98% on BiPAP.  Serum sodium is 134, potassium 4.1, chloride 93, bicarb 25, BUN of 20, serum creatinine 1.31, eGFR 40, nonfasting blood glucose 174, WBC 0.8, hemoglobin 12.9, platelets of 259.  Lactic acid is 1.9.  HS troponin is 120.  ED treatment: DuoNebs one-time treatment, Solu-Medrol  125 mg IV one-time dose.

## 2023-11-09 NOTE — ED Notes (Signed)
 CALLED NURSE BRANDON INFORM BED ASSIGNED

## 2023-11-09 NOTE — ED Triage Notes (Signed)
 Patient to ED via POV for respiratory distress. Pt brought over from [facility] pulmonology for O2 at 50% on 8L [place]. Pt wears 6L [place] at baseline. Pt states she has been feeling SOB x3 days. Unable to speak in full sentences. PT placed on NRB.

## 2023-11-10 ENCOUNTER — Inpatient Hospital Stay
Admit: 2023-11-10 | Discharge: 2023-11-10 | Disposition: A | Discharge status: Home / Self Care | Attending: Pulmonary Disease | Admitting: Pulmonary Disease

## 2023-11-10 DIAGNOSIS — J9621 Acute and chronic respiratory failure with hypoxia: Secondary | ICD-10-CM | POA: Diagnosis not present

## 2023-11-10 DIAGNOSIS — J9622 Acute and chronic respiratory failure with hypercapnia: Secondary | ICD-10-CM | POA: Diagnosis not present

## 2023-11-10 LAB — CBC
HCT: 34.4 % — ABNORMAL LOW (ref 36.0–46.0)
Hemoglobin: 11.8 g/dL — ABNORMAL LOW (ref 12.0–15.0)
MCH: 30.3 pg (ref 26.0–34.0)
MCHC: 34.3 g/dL (ref 30.0–36.0)
MCV: 88.4 fL (ref 80.0–100.0)
Platelets: 225 K/uL (ref 150–400)
RBC: 3.89 MIL/uL (ref 3.87–5.11)
RDW: 14.4 % (ref 11.5–15.5)
WBC: 7.3 K/uL (ref 4.0–10.5)
nRBC: 0 % (ref 0.0–0.2)

## 2023-11-10 LAB — ECHOCARDIOGRAM COMPLETE BUBBLE STUDY
AR max vel: 1.99 cm2
AV Area VTI: 2.1 cm2
AV Area mean vel: 2.09 cm2
AV Mean grad: 3.5 mmHg
AV Peak grad: 5.9 mmHg
Ao pk vel: 1.21 m/s
Area-P 1/2: 6.96 cm2
S' Lateral: 1.9 cm

## 2023-11-10 LAB — BASIC METABOLIC PANEL WITH GFR
Anion gap: 17 — ABNORMAL HIGH (ref 5–15)
BUN: 25 mg/dL — ABNORMAL HIGH (ref 8–23)
CO2: 24 mmol/L (ref 22–32)
Calcium: 8.3 mg/dL — ABNORMAL LOW (ref 8.9–10.3)
Chloride: 97 mmol/L — ABNORMAL LOW (ref 98–111)
Creatinine, Ser: 0.96 mg/dL (ref 0.44–1.00)
GFR, Estimated: 58 mL/min — ABNORMAL LOW (ref >60)
Glucose, Bld: 133 mg/dL — ABNORMAL HIGH (ref 70–99)
Potassium: 3.9 mmol/L (ref 3.5–5.1)
Sodium: 138 mmol/L (ref 135–145)

## 2023-11-10 LAB — RESPIRATORY PANEL BY PCR

## 2023-11-10 LAB — RESP PANEL BY RT-PCR (RSV, FLU A&B, COVID)  RVPGX2
Influenza A by PCR: NEGATIVE
Influenza B by PCR: NEGATIVE
Resp Syncytial Virus by PCR: NEGATIVE
SARS Coronavirus 2 by RT PCR: NEGATIVE

## 2023-11-10 LAB — FOLATE: Folate: >20 ng/mL (ref >5.9)

## 2023-11-10 LAB — HEPARIN LEVEL (UNFRACTIONATED)
Heparin Unfractionated: 0.12 [IU]/mL — ABNORMAL LOW (ref 0.30–0.70)
Heparin Unfractionated: 0.27 [IU]/mL — ABNORMAL LOW (ref 0.30–0.70)

## 2023-11-10 LAB — PHOSPHORUS: Phosphorus: 4.2 mg/dL (ref 2.5–4.6)

## 2023-11-10 LAB — IRON AND TIBC
Iron: 30 ug/dL (ref 28–170)
Saturation Ratios: 13 % (ref 10.4–31.8)
TIBC: 228 ug/dL — ABNORMAL LOW (ref 250–450)
UIBC: 198 ug/dL

## 2023-11-10 LAB — C-REACTIVE PROTEIN: CRP: 19.5 mg/dL — ABNORMAL HIGH (ref <1.0)

## 2023-11-10 LAB — MAGNESIUM: Magnesium: 2.1 mg/dL (ref 1.7–2.4)

## 2023-11-10 LAB — BLOOD GAS, VENOUS: Patient temperature: 37

## 2023-11-10 LAB — D-DIMER, QUANTITATIVE: D-Dimer, Quant: 1.07 ug{FEU}/mL — ABNORMAL HIGH (ref 0.00–0.50)

## 2023-11-10 LAB — VITAMIN B12: Vitamin B-12: 821 pg/mL (ref 180–914)

## 2023-11-10 LAB — VITAMIN D 25 HYDROXY (VIT D DEFICIENCY, FRACTURES): Vit D, 25-Hydroxy: 27.12 ng/mL — ABNORMAL LOW (ref 30–100)

## 2023-11-10 MED ORDER — PANTOPRAZOLE SODIUM 40 MG PO TBEC: 40.0000 mg | DELAYED_RELEASE_TABLET | Freq: Two times a day (BID) | ORAL | Status: DC

## 2023-11-10 MED ORDER — PANTOPRAZOLE SODIUM 40 MG IV SOLR
40.0000 mg | Freq: Two times a day (BID) | INTRAVENOUS | Status: DC
  Administered 2023-11-10 – 2023-11-12 (×5): 40 mg via INTRAVENOUS
  Filled 2023-11-10 (×5): qty 10

## 2023-11-10 MED ORDER — AZATHIOPRINE 50 MG PO TABS
50.0000 mg | ORAL_TABLET | Freq: Two times a day (BID) | ORAL | Status: DC
  Administered 2023-11-10 – 2023-11-18 (×17): 50 mg via ORAL
  Filled 2023-11-10 (×19): qty 1

## 2023-11-10 MED ORDER — BUDESONIDE 0.25 MG/2ML IN SUSP
0.2500 mg | Freq: Two times a day (BID) | RESPIRATORY_TRACT | Status: DC
  Administered 2023-11-10: 0.25 mg via RESPIRATORY_TRACT
  Filled 2023-11-10: qty 2

## 2023-11-10 MED ORDER — PREDNISONE 20 MG PO TABS
40.0000 mg | ORAL_TABLET | Freq: Every day | ORAL | Status: AC
  Administered 2023-11-12 – 2023-11-13 (×2): 40 mg via ORAL
  Filled 2023-11-10 (×2): qty 2

## 2023-11-10 MED ORDER — PIRFENIDONE 267 MG PO TABS
534.0000 mg | ORAL_TABLET | Freq: Three times a day (TID) | ORAL | Status: DC
  Administered 2023-11-10 – 2023-11-18 (×22): 534 mg via ORAL
  Filled 2023-11-10 (×67): qty 2

## 2023-11-10 MED ORDER — PREDNISONE 20 MG PO TABS
20.0000 mg | ORAL_TABLET | Freq: Every day | ORAL | Status: DC
  Administered 2023-11-17 – 2023-11-18 (×2): 20 mg via ORAL
  Filled 2023-11-10 (×2): qty 1

## 2023-11-10 MED ORDER — METHYLPREDNISOLONE SODIUM SUCC 40 MG IJ SOLR
40.0000 mg | Freq: Two times a day (BID) | INTRAMUSCULAR | Status: AC
  Administered 2023-11-10 (×2): 40 mg via INTRAVENOUS
  Filled 2023-11-10 (×2): qty 1

## 2023-11-10 MED ORDER — ENOXAPARIN SODIUM 40 MG/0.4ML IJ SOSY
40.0000 mg | PREFILLED_SYRINGE | Freq: Every evening | INTRAMUSCULAR | Status: DC
  Administered 2023-11-10 – 2023-11-17 (×8): 40 mg via SUBCUTANEOUS
  Filled 2023-11-10 (×8): qty 0.4

## 2023-11-10 MED ORDER — HEPARIN BOLUS VIA INFUSION
1700.0000 [IU] | Freq: Once | INTRAVENOUS | Status: AC
  Administered 2023-11-10: 1700 [IU] via INTRAVENOUS
  Filled 2023-11-10: qty 1700

## 2023-11-10 MED ORDER — PREDNISONE 10 MG PO TABS: 10.0000 mg | ORAL_TABLET | Freq: Every day | ORAL | Status: DC

## 2023-11-10 MED ORDER — FUROSEMIDE 10 MG/ML IJ SOLN
40.0000 mg | Freq: Every day | INTRAMUSCULAR | Status: DC
  Administered 2023-11-10 – 2023-11-18 (×9): 40 mg via INTRAVENOUS
  Filled 2023-11-10 (×9): qty 4

## 2023-11-10 MED ORDER — METHYLPREDNISOLONE SODIUM SUCC 40 MG IJ SOLR
40.0000 mg | INTRAMUSCULAR | Status: AC
  Administered 2023-11-11: 40 mg via INTRAVENOUS
  Filled 2023-11-10: qty 1

## 2023-11-10 MED ORDER — PREDNISONE 10 MG PO TABS
30.0000 mg | ORAL_TABLET | Freq: Every day | ORAL | Status: AC
  Administered 2023-11-14 – 2023-11-16 (×3): 30 mg via ORAL
  Filled 2023-11-10 (×3): qty 1

## 2023-11-10 MED ORDER — ROFLUMILAST 500 MCG PO TABS
250.0000 ug | ORAL_TABLET | Freq: Every day | ORAL | Status: DC
  Filled 2023-11-10: qty 1

## 2023-11-10 MED ORDER — ARFORMOTEROL TARTRATE 15 MCG/2ML IN NEBU
15.0000 ug | INHALATION_SOLUTION | Freq: Two times a day (BID) | RESPIRATORY_TRACT | Status: DC
  Administered 2023-11-10 – 2023-11-17 (×15): 15 ug via RESPIRATORY_TRACT
  Filled 2023-11-10 (×16): qty 2

## 2023-11-10 MED ORDER — HYDROCOD POLI-CHLORPHE POLI ER 10-8 MG/5ML PO SUER
5.0000 mL | Freq: Two times a day (BID) | ORAL | Status: DC | PRN
Start: 2023-11-10 — End: 2023-11-18

## 2023-11-10 MED ORDER — GUAIFENESIN ER 600 MG PO TB12
600.0000 mg | ORAL_TABLET | Freq: Two times a day (BID) | ORAL | Status: DC
  Administered 2023-11-10 – 2023-11-18 (×16): 600 mg via ORAL
  Filled 2023-11-10 (×16): qty 1

## 2023-11-10 NOTE — Consult Note (Signed)
 PHARMACY - ANTICOAGULATION CONSULT NOTE  Pharmacy Consult for IV Heparin  Indication: chest pain/ACS  Patient Measurements: Height: 4' 11.5 (151.1 cm) Weight: 59 kg (130 lb 1.1 oz) IBW/kg (Calculated) : 44.35 HEPARIN  DW (KG): 55.5  Labs: Recent Labs    11/09/23 1343 11/09/23 2138 11/09/23 2328  HGB 12.9  --   --   HCT 38.1  --   --   PLT 259  --   --   APTT  --  56*  --   LABPROT  --  15.4*  --   INR  --  1.2  --   HEPARINUNFRC  --   --  0.12*  CREATININE 1.31*  --   --   TROPONINIHS 120* 76*  --     Estimated Creatinine Clearance: 24.9 mL/min (A) (by C-G formula based on SCr of 1.31 mg/dL (H)).   Medical History: Past Medical History:  Diagnosis Date   Arthritis    Heart murmur    Hyperlipidemia    Hypertension    Hyponatremia    IPF (idiopathic pulmonary fibrosis) (HCC)     Medications:  No apparent anticoagulation prior to admission per my chart review. Medication reconciliation is pending  Assessment: 83 y/o F with medical history as above presenting with respiratory distress. Troponin elevated. Pharmacy consulted to dose heparin  for suspected ACS.  Baseline aPTT and INR are pending. Baseline CBC within normal limits.  Goal of Therapy:  Heparin  level 0.3-0.7 units/ml Monitor platelets by anticoagulation protocol: Yes  10/15 2328   Plan:  --Heparin  1700 unit IV bolus x 1  --Increase heparin  infusion to 850 units/hr --Recheck HL 8 hours after rate change --Daily CBC per protocol while on IV heparin   Susan Sosa,Susan Sosa 11/10/2023,12:36 AM

## 2023-11-10 NOTE — Consult Note (Signed)
 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 Recent addition of Imuran due to progressive decline in PFT.  She at times gets respiratory infections but generally bounces back after course of abx and steroid taper.  She has also been on steroids with decadron with partial improvement. 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  Her PFT have also declined over last five years but FEV1 this year has been spared >80% with severe decrement in DLCO as excpected with PH and ILD.  She has a previous history of smoking for appx 40years and does have emphysematous changes overlying her UIP pattern of fibrosis. She is here with acute exacerbation of IPF with acute on chronic hypoxemic respiratory failure. She was noted to have elevated BNP >1343, and did have TTE last month showing impaired diastology.  PCCM consultation placed for management of complex pulmonary patient with advanced IPF with chronic hypoxemia.    PAST MEDICAL HISTORY   Past Medical History:  Diagnosis Date   Arthritis    Heart murmur    Hyperlipidemia    Hypertension    Hyponatremia    IPF (idiopathic pulmonary fibrosis) (HCC)      SURGICAL HISTORY   Past  Surgical History:  Procedure Laterality Date   LEFT HEART CATH AND CORONARY ANGIOGRAPHY N/A 11/27/2021   Procedure: LEFT HEART CATH AND CORONARY ANGIOGRAPHY and possible pci and stent;  Surgeon: Florencio Cara BIRCH, MD;  Location: [facility] INVASIVE CV LAB;  Service: Cardiovascular;  Laterality: N/A;   RIGHT HEART CATH Right 08/10/2023   Procedure: RIGHT HEART CATH;  Surgeon: Florencio Cara BIRCH, MD;  Location: [facility] INVASIVE CV LAB;  Service: Cardiovascular;  Laterality: Right;     FAMILY HISTORY   Family History  Problem Relation Age of Onset   Stroke Mother    Coronary artery disease Mother    Coronary artery disease Father    Stroke Father    Diabetes Father      SOCIAL HISTORY   Social History   Tobacco Use   Smoking status: Former   Smokeless tobacco: Never   Tobacco comments:    Quit 30 years ago  Substance Use Topics   Alcohol use: Never   Drug use: Never     MEDICATIONS    Home Medication:    Current Medication:  Current Facility-Administered Medications:    acetaminophen  (TYLENOL ) tablet 650 mg, 650 mg, Oral, Q6H PRN **OR** acetaminophen  (TYLENOL ) suppository 650 mg, 650 mg, Rectal, Q6H PRN, Cox, Amy N, DO   arformoterol (BROVANA) nebulizer solution 15 mcg, 15 mcg, Nebulization, BID, 15 mcg at 11/10/23 0824 **AND** [DISCONTINUED]  budesonide (PULMICORT) nebulizer solution 0.25 mg, 0.25 mg, Nebulization, BID, Von Bellis, MD, 0.25 mg at 11/10/23 9176   aspirin  chewable tablet 81 mg, 81 mg, Oral, QPM, Cox, Amy N, DO   Chlorhexidine Gluconate Cloth 2 % PADS 6 each, 6 each, Topical, Daily, Mansy, Jan A, MD   furosemide  (LASIX ) injection 40 mg, 40 mg, Intravenous, Daily, Kolbe Delmonaco, MD   hydrALAZINE  (APRESOLINE ) injection 5 mg, 5 mg, Intravenous, Q6H PRN, Cox, Amy N, DO   LORazepam (ATIVAN) injection 0.5 mg, 0.5 mg, Intravenous, Q6H PRN, Cox, Amy N, DO   methylPREDNISolone  sodium succinate (SOLU-MEDROL ) 40 mg/mL injection 40 mg, 40 mg, Intravenous, Q12H **FOLLOWED  BY** [START ON 11/11/2023] methylPREDNISolone  sodium succinate (SOLU-MEDROL ) 40 mg/mL injection 40 mg, 40 mg, Intravenous, Q24H **FOLLOWED BY** [START ON 11/12/2023] predniSONE  (DELTASONE ) tablet 40 mg, 40 mg, Oral, Q breakfast **FOLLOWED BY** [START ON 11/14/2023] predniSONE  (DELTASONE ) tablet 30 mg, 30 mg, Oral, Q breakfast **FOLLOWED BY** [START ON 11/17/2023] predniSONE  (DELTASONE ) tablet 20 mg, 20 mg, Oral, Q breakfast **FOLLOWED BY** [START ON 11/20/2023] predniSONE  (DELTASONE ) tablet 10 mg, 10 mg, Oral, Q breakfast, Von Bellis, MD   nitroGLYCERIN  (NITROSTAT ) SL tablet 0.4 mg, 0.4 mg, Sublingual, Q5 min PRN, Cox, Amy N, DO   ondansetron  (ZOFRAN ) tablet 4 mg, 4 mg, Oral, Q6H PRN **OR** ondansetron  (ZOFRAN ) injection 4 mg, 4 mg, Intravenous, Q6H PRN, Cox, Amy N, DO   pantoprazole  (PROTONIX ) EC tablet 40 mg, 40 mg, Oral, BID, Von, Dileep, MD   senna-docusate (Senokot-S) tablet 1 tablet, 1 tablet, Oral, QHS PRN, Cox, Amy N, DO    ALLERGIES   Latex, Lidocaine, Atorvastatin , Losartan , Metronidazole, and Oxycodone      REVIEW OF SYSTEMS    Review of Systems:  Gen:  Denies  fever, sweats, chills weigh loss  HEENT: Denies blurred vision, double vision, ear pain, eye pain, hearing loss, nose bleeds, sore throat Cardiac:  No dizziness, chest pain or heaviness, chest tightness,edema Resp:   reports dyspnea chronically  Gi: Denies swallowing difficulty, stomach pain, nausea or vomiting, diarrhea, constipation, bowel incontinence Gu:  Denies bladder incontinence, burning urine Ext:   Denies Joint pain, stiffness or swelling Skin: Denies  skin rash, easy bruising or bleeding or hives Endoc:  Denies polyuria, polydipsia , polyphagia or weight change Psych:   Denies depression, insomnia or hallucinations   Other:  All other systems negative   VS: BP 111/62 (BP Location: Left Arm)   Pulse 90   Temp 97.9 F (36.6 C) (Axillary)   Resp 15   Ht 4' 11.5 (1.511 m)   Wt 59 kg   SpO2 94%    BMI 25.83 kg/m      PHYSICAL EXAM    GENERAL:NAD, no fevers, chills, no weakness no fatigue HEAD: Normocephalic, atraumatic.  EYES: Pupils equal, round, reactive to light. Extraocular muscles intact. No scleral icterus.  MOUTH: Moist mucosal membrane. Dentition intact. No abscess noted.  EAR, NOSE, THROAT: Clear without exudates. No external lesions.  NECK: Supple. No thyromegaly. No nodules. No JVD.  PULMONARY: decreased breath sounds with mild rhonchi worse at bases bilaterally.  CARDIOVASCULAR: S1 and S2. Regular rate and rhythm. No murmurs, rubs, or gallops. No edema. Pedal pulses 2+ bilaterally.  GASTROINTESTINAL: Soft, nontender, nondistended. No masses. Positive bowel sounds. No hepatosplenomegaly.  MUSCULOSKELETAL: No swelling, clubbing, or edema. Range of motion full in all extremities.  NEUROLOGIC: Cranial nerves II through XII are intact. No gross focal neurological deficits. Sensation intact. Reflexes intact.  SKIN: No ulceration, lesions, rashes, or  cyanosis. Skin warm and dry. Turgor intact.  PSYCHIATRIC: Mood, affect within normal limits. The patient is awake, alert and oriented x 3. Insight, judgment intact.       IMAGING   @IMAGES @   ASSESSMENT/PLAN   Acute on chronic hypoxemic respiratory failure       Currently negative for viral LRTI due to negative RVP and SARS/FLU/RSV panel      - inflammatory workup -ddimer, CRP      - VBG today      - Her blood work showed elevated BNP id like to repeat TTE with bubble study - she sees [facility] cardiology     Idiopathic pulmonary fibrosis with acute exacerbation    - advanced and progressed over past 5 years    - will continue steroids at current dose with taper   Centrilobular emphysema -no signs of acute exacerbation of COPD  - viral workup and inflammatory biomarkers are negative   - duoneb prn is sufficient at this time   -  Pulmonary hypertension associated with pulmonary fibrosis    - will dc tyvasso DPI at  home with poor tolerance will switch to [place] going forward  -pre and post capillary per RHC done this year   -PT/OT ordered   Bibasilar atelectasis   - recruitement maneuvers via chest physiotherapy utilizing metaneb with duoneb     - PT /OT  Physical deconditioning   Continue PT/OT , continue chest PT  BODE score >8 with poor long term prognosis   Goals of care     - patient is currently DNR      Thank you for allowing me to participate in the care of this patient.   Patient/Family are satisfied with care plan and all questions have been answered.    Provider disclosure: Patient with at least one acute or chronic illness or injury that poses a threat to life or bodily function and is being managed actively during this encounter.  All of the below services have been performed independently by signing provider:  review of prior documentation from internal and or external health records.  Review of previous and current lab results.  Interview and comprehensive assessment during patient visit today. Review of current and previous chest radiographs/CT scans. Discussion of management and test interpretation with health care team and patient/family.   This document was prepared using Dragon voice recognition software and may include unintentional dictation errors.     Harla Mensch, M.D.  Division of Pulmonary & Critical Care Medicine

## 2023-11-10 NOTE — Progress Notes (Signed)
 [facility] Progress Note  Patient: Susan Sosa    FMW:[id number]  DOA: 11/09/2023     Date of Service: the patient was seen and examined on 11/10/2023  Chief Complaint  Patient presents with   Respiratory Distress   Brief hospital course: Susan Sosa is an 83 year old female with history of pulmonary fibrosis with chronic hypoxemic respiratory failure, GERD,, hypertension, hyperlipidemia, who [facility] from [facility] pul[facility]ology clinic for chief concerns of acute on chronic respiratory failure requiring increased oxygen supplementation.   Vitals in the ED showed t 97.7, rr 29, hr 140, blood pressure 165/86, SpO2 98% on BiPAP.   Serum sodium is 134, potassium 4.1, chloride 93, bicarb 25, BUN of 20, serum creatinine 1.31, eGFR 40, nonfasting blood glucose 174, WBC 0.8, hemoglobin 12.9, platelets of 259.   Lactic acid is 1.9.  HS troponin is 120.   ED treatment: DuoNebs one-time treatment, Solu-Medrol  125 mg IV one-time dose.   Assessment and Plan:  # Acute on chronic respiratory failure  At home patient is on 6 L at baseline O2 sat was 50% on 8 L at [facility] VBG showed mild hypercapnia Patient was placed on BiPAP and admitted to stepdown unit Continue supplemental O2 halogen and gradually wean down to her baseline Use BiPAP as needed S/p Solu-Medrol  80 mg x 1 dose given Started IV Solu-Medrol  tapering dose Started Brovana nebulizer twice daily Continue DuoNeb every 6 hourly prn Mucinex 600 mg p.o. twice daily, Tussionex as needed Continue PPI for GI prophylaxis Pulmonary consulted, started azathioprine 50 mg p.o. twice daily and Lasix  40 mg IV daily Check RVP panel   # AKI (acute kidney injury) On CKD stage IIIa. BNP 1343 sCr 1.31>>0.96 Strict I's and O's BNP was elevated so Lasix  was started by pulmonary Monitor renal functions and urine output     #Elevated troponin most likely secondary to demand ischemia due to hypoxemia Patient denied any  chest pain, S/p heparin  IV infusion, discontinued  Troponin remained flat. Follow-up TTE   Hypertension with sinus tachycardia due to hypoxia Continue to monitor on telemetry Use hydralazine  as needed Monitor BP and titrate medications accordingly    Body mass index is 25.83 kg/m.  Interventions:  Diet: Regular diet DVT Prophylaxis: Subcutaneous Lovenox    Advance goals of care discussion: Full code  Family Communication: family was present at bedside, at the time of interview.  The pt provided permission to discuss medical plan with the family. Opportunity was given to ask question and all questions were answered satisfactorily.   Disposition:  Pt is from home, admitted with respiratory failure, still has shortness of breath, which precludes a safe discharge. Discharge to home, when stable, may need few days to improve.  Subjective: No significant events overnight, patient is feeling improvement in the shortness of breath, still requiring more oxygen than her baseline. Denied any chest pain or palpitation, no any other active issues.  Physical Exam: General: NAD, lying comfortably Appear in no distress, affect appropriate Eyes: PERRLA ENT: Oral Mucosa Clear, moist  Neck: no JVD,  Cardiovascular: S1 and S2 Present, no Murmur,  Respiratory: Increased work of breathing, shortness of breath, mild crackles bilaterally, no significant wheezes  Abdomen: Bowel Sound present, Soft and no tenderness,  Skin: no rashes Extremities: no Pedal edema, no calf tenderness Neurologic: without any new focal findings Gait not checked due to patient safety concerns  Vitals:   11/10/23 0600 11/10/23 0700 11/10/23 0800 11/10/23 1200  BP: 119/65 127/70 111/62 (!) 134/109  Pulse: 87 85 90 (!) 102  Resp: 20 16 15 16   Temp:   97.9 F (36.6 C) 99 F (37.2 C)  TempSrc:   Axillary Oral  SpO2: 95% 96% 94% 95%  Weight:      Height:        Intake/Output Summary (Last 24 hours) at 11/10/2023  1517 Last data filed at 11/10/2023 1511 Gross per 24 hour  Intake 148.62 ml  Output 700 ml  Net -551.38 ml   Filed Weights   11/09/23 1327  Weight: 59 kg    Data Reviewed: I have personally reviewed and interpreted daily labs, tele strips, imagings as discussed above. I reviewed all nursing notes, pharmacy notes, vitals, pertinent old records I have discussed plan of care as described above with RN and patient/family.  CBC: Recent Labs  Lab 11/09/23 1343 11/10/23 0639  WBC 9.8 7.3  HGB 12.9 11.8*  HCT 38.1 34.4*  MCV 90.1 88.4  PLT 259 225   Basic Metabolic Panel: Recent Labs  Lab 11/09/23 1343 11/10/23 0639 11/10/23 0839  NA 134* 138  --   K 4.1 3.9  --   CL 93* 97*  --   CO2 25 24  --   GLUCOSE 174* 133*  --   BUN 20 25*  --   CREATININE 1.31* 0.96  --   CALCIUM  8.5* 8.3*  --   MG  --   --  2.1  PHOS  --   --  4.2    Studies: No results found.  Scheduled Meds:  arformoterol  15 mcg Nebulization BID   aspirin   81 mg Oral QPM   azaTHIOprine  50 mg Oral BID   Chlorhexidine Gluconate Cloth  6 each Topical Daily   furosemide   40 mg Intravenous Daily   methylPREDNISolone  (SOLU-MEDROL ) injection  40 mg Intravenous Q12H   Followed by   NOREEN ON 11/11/2023] methylPREDNISolone  (SOLU-MEDROL ) injection  40 mg Intravenous Q24H   Followed by   NOREEN ON 11/12/2023] predniSONE   40 mg Oral Q breakfast   Followed by   NOREEN ON 11/14/2023] predniSONE   30 mg Oral Q breakfast   Followed by   NOREEN ON 11/17/2023] predniSONE   20 mg Oral Q breakfast   Followed by   NOREEN ON 11/20/2023] predniSONE   10 mg Oral Q breakfast   pantoprazole  (PROTONIX ) IV  40 mg Intravenous Q12H   Continuous Infusions: PRN Meds: acetaminophen  **OR** acetaminophen , hydrALAZINE , LORazepam, nitroGLYCERIN , ondansetron  **OR** ondansetron  (ZOFRAN ) IV, senna-docusate  Time spent: 55 minutes  Author: ELVAN SOR. MD [facility] 11/10/2023 3:17 PM  To reach On-call, see care teams  to locate the attending and reach out to them via [email or web address]. If 7PM-7AM, please contact night-coverage If you still have difficulty reaching the attending provider, please page the [facility] (Director on Call) for [facility] on amion for assistance.

## 2023-11-10 NOTE — Plan of Care (Signed)
 Patient remains off BiPap and is transitioned to HFNC at 7L. Patient is tolerating it well and denies pain and SOB.   Problem: Education: Goal: Knowledge of General Education information will improve Description: Including pain rating scale, medication(s)/side effects and non-pharmacologic comfort measures Outcome: Progressing   Problem: Pain Managment: Goal: General experience of comfort will improve and/or be controlled Outcome: Progressing   Problem: Safety: Goal: Ability to remain free from injury will improve Outcome: Progressing   Problem: Skin Integrity: Goal: Risk for impaired skin integrity will decrease Outcome: Progressing

## 2023-11-10 NOTE — Plan of Care (Signed)
  Problem: Clinical Measurements: Goal: Respiratory complications will improve Outcome: Not Progressing Goal: Cardiovascular complication will be avoided Outcome: Not Progressing   Problem: Activity: Goal: Risk for activity intolerance will decrease Outcome: Not Progressing

## 2023-11-10 NOTE — Consult Note (Signed)
 PHARMACY - ANTICOAGULATION CONSULT NOTE  Pharmacy Consult for IV Heparin  Indication: chest pain/ACS  Patient Measurements: Height: 4' 11.5 (151.1 cm) Weight: 59 kg (130 lb 1.1 oz) IBW/kg (Calculated) : 44.35 HEPARIN  DW (KG): 55.5  Labs: Recent Labs    11/09/23 1343 11/09/23 2138 11/09/23 2328 11/10/23 0555 11/10/23 0639  HGB 12.9  --   --   --  11.8*  HCT 38.1  --   --   --  34.4*  PLT 259  --   --   --  225  APTT  --  56*  --   --   --   LABPROT  --  15.4*  --   --   --   INR  --  1.2  --   --   --   HEPARINUNFRC  --   --  0.12* 0.27*  --   CREATININE 1.31*  --   --   --  0.96  TROPONINIHS 120* 76*  --   --   --     Estimated Creatinine Clearance: 34 mL/min (by C-G formula based on SCr of 0.96 mg/dL).   Medical History: Past Medical History:  Diagnosis Date   Arthritis    Heart murmur    Hyperlipidemia    Hypertension    Hyponatremia    IPF (idiopathic pulmonary fibrosis) (HCC)     Medications:  No apparent anticoagulation prior to admission per my chart review. Medication reconciliation is pending  Assessment: 83 y/o F with medical history as above presenting with respiratory distress. Troponin elevated. Pharmacy consulted to dose heparin  for suspected ACS.  Baseline Labs: aPTT 56s, INR 1.2, CBC within normal limits.  Goal of Therapy:  Heparin  level 0.3-0.7 units/ml Monitor platelets by anticoagulation protocol: Yes   Plan: heparin  level subtherapeutic --Increase heparin  infusion rate to 950 units/hr (bolus withheld given narrow follow up time between last rate change/upward trajectory and level check) --Recheck heparin  level 8 hours after rate change --Daily CBC per protocol while on IV heparin   Adriana JONETTA Bolster 11/10/2023,7:37 AM

## 2023-11-11 ENCOUNTER — Inpatient Hospital Stay

## 2023-11-11 DIAGNOSIS — J9601 Acute respiratory failure with hypoxia: Secondary | ICD-10-CM | POA: Diagnosis not present

## 2023-11-11 DIAGNOSIS — R000 Tachycardia, unspecified: Secondary | ICD-10-CM

## 2023-11-11 DIAGNOSIS — J9620 Acute and chronic respiratory failure, unspecified whether with hypoxia or hypercapnia: Secondary | ICD-10-CM

## 2023-11-11 DIAGNOSIS — E7800 Pure hypercholesterolemia, unspecified: Secondary | ICD-10-CM

## 2023-11-11 DIAGNOSIS — I10 Essential (primary) hypertension: Secondary | ICD-10-CM

## 2023-11-11 LAB — MAGNESIUM: Magnesium: 1.9 mg/dL (ref 1.7–2.4)

## 2023-11-11 LAB — TROPONIN I (HIGH SENSITIVITY)
Troponin I (High Sensitivity): 39 ng/L — ABNORMAL HIGH (ref <18)
Troponin I (High Sensitivity): 39 ng/L — ABNORMAL HIGH (ref <18)

## 2023-11-11 LAB — BASIC METABOLIC PANEL WITH GFR
Anion gap: 11 (ref 5–15)
BUN: 36 mg/dL — ABNORMAL HIGH (ref 8–23)
CO2: 30 mmol/L (ref 22–32)
Calcium: 8.1 mg/dL — ABNORMAL LOW (ref 8.9–10.3)
Chloride: 94 mmol/L — ABNORMAL LOW (ref 98–111)
Creatinine, Ser: 0.99 mg/dL (ref 0.44–1.00)
GFR, Estimated: 56 mL/min — ABNORMAL LOW
Glucose, Bld: 172 mg/dL — ABNORMAL HIGH (ref 70–99)
Potassium: 3.6 mmol/L (ref 3.5–5.1)
Sodium: 135 mmol/L (ref 135–145)

## 2023-11-11 LAB — CBC
HCT: 35.6 % — ABNORMAL LOW (ref 36.0–46.0)
Hemoglobin: 12.1 g/dL (ref 12.0–15.0)
MCH: 30.4 pg (ref 26.0–34.0)
MCHC: 34 g/dL (ref 30.0–36.0)
MCV: 89.4 fL (ref 80.0–100.0)
Platelets: 280 K/uL (ref 150–400)
RBC: 3.98 MIL/uL (ref 3.87–5.11)
RDW: 14 % (ref 11.5–15.5)
WBC: 8.5 K/uL (ref 4.0–10.5)
nRBC: 0 % (ref 0.0–0.2)

## 2023-11-11 LAB — PHOSPHORUS: Phosphorus: 4.9 mg/dL — ABNORMAL HIGH (ref 2.5–4.6)

## 2023-11-11 MED ORDER — VERAPAMIL HCL ER 180 MG PO TBCR
180.0000 mg | EXTENDED_RELEASE_TABLET | Freq: Every day | ORAL | Status: DC
  Filled 2023-11-11: qty 1

## 2023-11-11 MED ORDER — VERAPAMIL HCL ER 120 MG PO TBCR
180.0000 mg | EXTENDED_RELEASE_TABLET | Freq: Every day | ORAL | Status: DC
  Administered 2023-11-11 – 2023-11-14 (×5): 180 mg via ORAL
  Filled 2023-11-11 (×5): qty 1.5

## 2023-11-11 MED ORDER — TREPROSTINIL 16 MCG IN POWD
16.0000 ug | Freq: Four times a day (QID) | RESPIRATORY_TRACT | Status: DC
Start: 2023-11-11 — End: 2023-11-18
  Administered 2023-11-11 – 2023-11-18 (×29): 16 ug via RESPIRATORY_TRACT
  Filled 2023-11-11 (×33): qty 1

## 2023-11-11 MED ORDER — VERAPAMIL HCL ER 180 MG PO TBCR: 180.0000 mg | EXTENDED_RELEASE_TABLET | Freq: Every day | ORAL | Status: DC

## 2023-11-11 MED ORDER — IOHEXOL 350 MG/ML SOLN
70.0000 mL | Freq: Once | INTRAVENOUS | Status: AC | PRN
  Administered 2023-11-11: 70 mL via INTRAVENOUS

## 2023-11-11 NOTE — TOC Initial Note (Signed)
 Transition of Care [facility]) - Initial/Assessment Note    Patient Details  Name: Susan Sosa MRN: [id number] Date of Birth: 1940-12-03  Transition of Care [facility]) CM/SW Contact:    Corrie JINNY Ruts, LCSW Phone Number: 11/11/2023, 10:51 AM  Clinical Narrative:                 Chart reviewed. The patient was admitted for Acute on chronic respiratory failure. I spoke with the patient at bedside today. I introduced myself, my role, and reason for consult.   The patient reports that she has been doing well. The patient reports that she lives with her husband. The patient reports that she was able to complete daily living task independently until the last 2 days. The patient report that her husband drives herself to medical appointments. The patient reports that her husband will assist during discharge.   The [facility]. The patient reports that she has had HH in the past and it was a month ago. The patient reports that she was not able to continue [facility] because she was was short of breath. The patient reports that she has never been admitted into a SNF in the past.   The patient reports that she has a cane and a walker in the home. The patient did not have any other concerns or questions during the time of the assessment. There are no other TOC needs at this time. TOC will follow the patient until D/C.     Barriers to Discharge: Continued Medical Work up   Patient Goals and CMS Choice            Expected Discharge Plan and Services       Living arrangements for the past 2 months: Single Family Home                                      Prior Living Arrangements/Services Living arrangements for the past 2 months: Single Family Home Lives with:: Spouse Patient language and need for interpreter reviewed:: Yes        Need for Family Participation in Patient Care: Yes (Comment)     Criminal Activity/Legal Involvement Pertinent to Current  Situation/Hospitalization: No - Comment as needed  Activities of Daily Living   ADL Screening (condition at time of admission) Independently performs ADLs?: Yes (appropriate for developmental age) Is the patient deaf or have difficulty hearing?: No Does the patient have difficulty seeing, even when wearing glasses/contacts?: No Does the patient have difficulty concentrating, remembering, or making decisions?: No  Permission Sought/Granted                  Emotional Assessment Appearance:: Appears stated age Attitude/Demeanor/Rapport: Gracious Affect (typically observed): Calm, Pleasant Orientation: : Oriented to Place, Oriented to  Time, Oriented to Self, Oriented to Situation Alcohol / Substance Use: Not Applicable Psych Involvement: No (comment)  Admission diagnosis:  Pulmonary fibrosis (HCC) [J84.10] Elevated troponin [R79.89] Acute respiratory failure with hypoxia (HCC) [J96.01] Acute on chronic respiratory failure (HCC) [J96.20] Patient Active Problem List   Diagnosis Date Noted   Acute on chronic respiratory failure (HCC) 11/09/2023   Sinus tachycardia 11/09/2023   Elevated troponin 11/09/2023   AKI (acute kidney injury) 11/09/2023   Acute on chronic respiratory failure with hypoxemia (HCC) 10/14/2023   Fall 05/24/2022   NICM (nonischemic cardiomyopathy) (HCC) 05/24/2022   Forehead laceration, initial encounter  05/24/2022   Facial trauma 05/24/2022   NSTEMI (non-ST elevated myocardial infarction) (HCC) 11/26/2021   Chronic respiratory failure with hypoxia (HCC) 11/26/2021   Chronic hyponatremia 11/26/2021   Degeneration of lumbosacral intervertebral disc 08/04/2020   Full thickness rotator cuff tear 08/04/2020   Osteoarthritis of knee 08/04/2020   Systolic murmur 09/04/2019   Idiopathic pulmonary fibrosis (HCC) 02/26/2019   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   Intermediate stage nonexudative age-related macular degeneration of both eyes 10/21/2017    Posterior vitreous detachment of left eye 10/21/2017   Presence of intraocular lens 10/21/2017   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   Hypomagnesemia 09/07/2017   Prediabetes 04/18/2016   Bilateral renal cysts 07/18/2014   Benign essential hypertension 07/18/2014   Paroxysmal atrial tachycardia 07/18/2014   Dyspnea 08/10/2011   Hypercholesterolemia 08/10/2011   PCP:  Alla Amis, MD Pharmacy:   [phone] [address], [place] - [address] [place] [address] Phone: [phone] Fax: [phone]  [facility] [address] [place] [address] Phone: [phone] Fax: [phone]     Social Drivers of Health (SDOH) Social History: SDOH Screenings   Food Insecurity: No Food Insecurity (11/09/2023)  Housing: Low Risk  (11/09/2023)  Transportation Needs: No Transportation Needs (11/09/2023)  Utilities: Not At Risk (11/09/2023)  Depression (PHQ2-9): Medium Risk (12/23/2020)  Financial Resource Strain: Low Risk  (03/30/2023)   Received from [facility]  Social Connections: Socially Integrated (11/09/2023)  Tobacco Use: Medium Risk (11/09/2023)   Received from [facility]   SDOH Interventions:     Readmission Risk Interventions    11/11/2023   10:49 AM  Readmission Risk Prevention Plan  Transportation Screening Complete  PCP or Specialist Appt within 3-5 Days Complete  Social Work Consult for Recovery Care Planning/Counseling Complete  Palliative Care Screening Not Applicable  Medication Review Oceanographer) Complete

## 2023-11-11 NOTE — TOC Progression Note (Signed)
 Transition of Care [facility]) - Progression Note    Patient Details  Name: Susan Sosa MRN: [id number] Date of Birth: Feb 18, 1940  [facility]) CM/SW Contact  K'La JINNY Ruts, LCSW Phone Number: 11/11/2023, 3:45 PM  Clinical Narrative:    Chart reviewed. [facility] from [facility] informed me that the patient is active with HH. The patient is receiving PT/OT/RN.        Barriers to Discharge: Continued Medical Work up               Expected Discharge Plan and Services       Living arrangements for the past 2 months: Single Family Home                                       Social Drivers of Health (SDOH) Interventions SDOH Screenings   Food Insecurity: No Food Insecurity (11/09/2023)  Housing: Low Risk  (11/09/2023)  Transportation Needs: No Transportation Needs (11/09/2023)  Utilities: Not At Risk (11/09/2023)  Depression (PHQ2-9): Medium Risk (12/23/2020)  Financial Resource Strain: Low Risk  (03/30/2023)   Received from [facility]  Social Connections: Socially Integrated (11/09/2023)  Tobacco Use: Medium Risk (11/09/2023)   Received from [facility]    Readmission Risk Interventions    11/11/2023   10:49 AM  Readmission Risk Prevention Plan  Transportation Screening Complete  PCP or Specialist Appt within 3-5 Days Complete  Social Work Consult for Recovery Care Planning/Counseling Complete  Palliative Care Screening Not Applicable  Medication Review Oceanographer) Complete

## 2023-11-11 NOTE — Progress Notes (Signed)
 At 1530 patient transferred to and from CT in hospital bed with cardiac monitoring and on 10L [place]; patient transported with this nurse present; patient tolerated trip well.

## 2023-11-11 NOTE — Consult Note (Signed)
 [facility] CLINIC CARDIOLOGY CONSULT NOTE       Patient ID: Susan Sosa MRN: [id number] DOB/AGE: October 09, 1940 83 y.o.  Admit date: 11/09/2023 Referring Physician Dr. Drue Potter Primary Physician Alla Amis, MD  Primary Cardiologist Dr. Rolanda Maiden, NP Reason for Consultation chest pain  HPI: Susan Sosa is a 83 y.o. female  with a past medical history of NICM, idiopathic pulmonary fibrosis, paroxysmal atrial tachycardia, hypertension, hyperlipidemia who presented to the ED on 11/09/2023 for SOB, hypoxia. Treated for acute on chronic hypoxic respiratory failure. 11/11/2023 was complaining of chest pain, cardiology was consulted for further evaluation.   Patient brought to the ED initially due to worsening shortness of breath.  Workup in the ED notable for creatinine 1.31, potassium 4.1, hemoglobin 12.9, WBC 9.8. Troponins 120 > 76 > 39 > 39, BNP 1300. EKG in the ED NSR rate 90 bpm, t wave inversions similar to prior. CXR without acute changes.  Treated for acute on chronic hypoxic respiratory failure with some diastolic heart failure as well.  Overnight tonight and today she has been complaining with intermittent chest pain episodes.  We were asked to evaluate.  Patient seen and examined this morning, resting comfortably in hospital bed with multiple family members at bedside.  We discussed her symptoms in further detail.  She endorses intermittent episodes of mild, sharp chest discomfort which last 2 to 3 minutes.  These have been occurring over the last 3 years.  She states that over the last couple days they have been more frequent thus she told her nurse about it this morning.  Review of systems complete and found to be negative unless listed above    Past Medical History:  Diagnosis Date   Arthritis    Heart murmur    Hyperlipidemia    Hypertension    Hyponatremia    IPF (idiopathic pulmonary fibrosis) (HCC)     Past Surgical History:  Procedure Laterality Date    LEFT HEART CATH AND CORONARY ANGIOGRAPHY N/A 11/27/2021   Procedure: LEFT HEART CATH AND CORONARY ANGIOGRAPHY and possible pci and stent;  Surgeon: Florencio Cara BIRCH, MD;  Location: [facility] INVASIVE CV LAB;  Service: Cardiovascular;  Laterality: N/A;   RIGHT HEART CATH Right 08/10/2023   Procedure: RIGHT HEART CATH;  Surgeon: Florencio Cara BIRCH, MD;  Location: [facility] INVASIVE CV LAB;  Service: Cardiovascular;  Laterality: Right;    Medications Prior to Admission  Medication Sig Dispense Refill Last Dose/Taking   albuterol  (PROVENTIL ) (2.5 MG/3ML) 0.083% nebulizer solution Take 2.5 mg by nebulization in the morning and at bedtime.   11/09/2023   aspirin  81 MG chewable tablet Chew 81 mg by mouth every evening.   11/08/2023   atorvastatin  (LIPITOR) 40 MG tablet Take 40 mg by mouth daily.   11/08/2023   budesonide-formoterol (SYMBICORT) 160-4.5 MCG/ACT inhaler Inhale 2 puffs into the lungs 2 (two) times daily.   11/09/2023   cholecalciferol (VITAMIN D3) 25 MCG (1000 UNIT) tablet Take 1,000 Units by mouth daily.   11/09/2023   cloNIDine  (CATAPRES  - DOSED IN MG/24 HR) 0.1 mg/24hr patch Place 1 patch onto the skin See admin instructions. Place 1 patch onto the skin every 5 days.   11/08/2023   esomeprazole (NEXIUM) 40 MG capsule Take 40 mg by mouth at bedtime.   11/08/2023   losartan  (COZAAR ) 50 MG tablet Take 25 mg by mouth in the morning and at bedtime.   11/09/2023   nitroGLYCERIN  (NITROSTAT ) 0.4 MG SL tablet Place 1 tablet (0.4 mg total) under  the tongue every 5 (five) minutes as needed for chest pain. 20 tablet 12 Taking As Needed   Pirfenidone  267 MG TABS Take 534 mg by mouth with breakfast, with lunch, and with evening meal.   11/09/2023   TYVASO  DPI MAINTENANCE KIT 16 MCG POWD Inhale 16 mcg into the lungs in the morning, at noon, in the evening, and at bedtime.   11/09/2023   verapamil  (CALAN -SR) 180 MG CR tablet Take 180 mg by mouth at bedtime.   11/08/2023   acetylcysteine  (MUCOMYST ) 20 %  nebulizer solution Take 4 mLs by nebulization 3 (three) times daily.      azathioprine (IMURAN) 100 MG tablet Take 100 mg by mouth daily.      OXYGEN Inhale 6 L into the lungs continuous.      Social History   Socioeconomic History   Marital status: Married    Spouse name: Not on file   Number of children: Not on file   Years of education: Not on file   Highest education level: Not on file  Occupational History   Not on file  Tobacco Use   Smoking status: Former   Smokeless tobacco: Never   Tobacco comments:    Quit 30 years ago  Substance and Sexual Activity   Alcohol use: Never   Drug use: Never   Sexual activity: Not Currently  Other Topics Concern   Not on file  Social History Narrative   ** Merged History Encounter **       Social Drivers of Health   Financial Resource Strain: Low Risk  (03/30/2023)   [facility] from [facility]   Overall Financial Resource Strain (CARDIA)    Difficulty of Paying Living Expenses: Not hard at all  Food Insecurity: No Food Insecurity (11/09/2023)   Hunger Vital Sign    Worried About Running Out of Food in the Last Year: Never true    Ran Out of Food in the Last Year: Never true  Transportation Needs: No Transportation Needs (11/09/2023)   PRAPARE - Administrator, Civil Service (Medical): No    Lack of Transportation (Non-Medical): No  Physical Activity: Not on file  Stress: Not on file  Social Connections: Socially Integrated (11/09/2023)   Social Connection and Isolation Panel    Frequency of Communication with Friends and Family: More than three times a week    Frequency of Social Gatherings with Friends and Family: More than three times a week    Attends Religious Services: More than 4 times per year    Active Member of Clubs or Organizations: Yes    Attends Banker Meetings: More than 4 times per year    Marital Status: Married  Catering manager Violence: Not At Risk (11/09/2023)    Humiliation, Afraid, Rape, and Kick questionnaire    Fear of Current or Ex-Partner: No    Emotionally Abused: No    Physically Abused: No    Sexually Abused: No    Family History  Problem Relation Age of Onset   Stroke Mother    Coronary artery disease Mother    Coronary artery disease Father    Stroke Father    Diabetes Father      Vitals:   11/11/23 0736 11/11/23 0800 11/11/23 0900 11/11/23 1000  BP:  (!) 140/74 120/72 118/64  Pulse: 87 89 94 91  Resp: (!) 34 (!) 29 17 (!) 31  Temp:  98 F (36.7 C)    TempSrc:  Oral    SpO2: 96% 95% (!) 87% 92%  Weight:      Height:        PHYSICAL EXAM General: Chronically ill-appearing elderly female, well nourished, in no acute distress. HEENT: Normocephalic and atraumatic. Neck: No JVD.  Lungs: Normal respiratory effort on HFNC 10 L. Clear bilaterally to auscultation. No wheezes, crackles, rhonchi.  Heart: HRRR. Normal S1 and S2 without gallops or murmurs.  Abdomen: Non-distended appearing.  Msk: Normal strength and tone for age. Extremities: Warm and well perfused. No clubbing, cyanosis.  Trace edema.  Neuro: Alert and oriented X 3. Psych: Answers questions appropriately.   Labs: Basic Metabolic Panel: Recent Labs    11/10/23 0639 11/10/23 0839 11/11/23 0238  NA 138  --  135  K 3.9  --  3.6  CL 97*  --  94*  CO2 24  --  30  GLUCOSE 133*  --  172*  BUN 25*  --  36*  CREATININE 0.96  --  0.99  CALCIUM  8.3*  --  8.1*  MG  --  2.1 1.9  PHOS  --  4.2 4.9*   Liver Function Tests: Recent Labs    11/09/23 1343  AST 24  ALT 16  ALKPHOS 54  BILITOT 1.0  PROT 7.1  ALBUMIN 3.3*   No results for input(s): LIPASE, AMYLASE in the last 72 hours. CBC: Recent Labs    11/10/23 0639 11/11/23 0238  WBC 7.3 8.5  HGB 11.8* 12.1  HCT 34.4* 35.6*  MCV 88.4 89.4  PLT 225 280   Cardiac Enzymes: Recent Labs    11/09/23 2138 11/11/23 0238 11/11/23 0356  TROPONINIHS 76* 39* 39*   BNP: Recent Labs     11/09/23 2138  BNP 1,343.7*   D-Dimer: Recent Labs    11/10/23 1125  DDIMER 1.07*   Hemoglobin A1C: No results for input(s): HGBA1C in the last 72 hours. Fasting Lipid Panel: No results for input(s): CHOL, HDL, LDLCALC, TRIG, CHOLHDL, LDLDIRECT in the last 72 hours. Thyroid Function Tests: No results for input(s): TSH, T4TOTAL, T3FREE, THYROIDAB in the last 72 hours.  Invalid input(s): FREET3 Anemia Panel: Recent Labs    11/09/23 2138 11/10/23 0839  VITAMINB12 821  --   FOLATE  --  >20.0  TIBC  --  228*  IRON  --  30     Radiology: ECHOCARDIOGRAM COMPLETE BUBBLE STUDY Result Date: 11/10/2023    ECHOCARDIOGRAM REPORT   Patient Name:   KATARYNA MCQUILKIN Date of Exam: 11/10/2023 Medical Rec #:  [id number]    Height:       59.5 in Accession #:    [id number]   Weight:       130.1 lb Date of Birth:  July 08, 1940     BSA:          1.545 m Patient Age:    83 years     BP:           111/62 mmHg Patient Gender: F            HR:           90 bpm. Exam Location:  [facility] Procedure: 2D Echo, Cardiac Doppler, Color Doppler and Saline Contrast Bubble            Study (Both Spectral and Color Flow Doppler were utilized during            procedure). Indications:     CHF-Congestive heart failure, Class I, acute on chronic,  diastolic.  History:         Patient has prior history of Echocardiogram examinations, most                  recent 10/17/2023. Signs/Symptoms:Murmur; Risk                  Factors:Hypertension.  Sonographer:     Christopher Furnace Referring Phys:  [id number] HALINA PICKING Diagnosing Phys: Dwayne D Callwood MD IMPRESSIONS  1. Left ventricular ejection fraction, by estimation, is 70 to 75%. The left ventricle has hyperdynamic function. The left ventricle has no regional wall motion abnormalities. Left ventricular diastolic parameters are consistent with Grade I diastolic dysfunction (impaired relaxation).  2. Right ventricular systolic function is moderately reduced.  The right ventricular size is moderately enlarged. Mildly increased right ventricular wall thickness.  3. The mitral valve is grossly normal. Trivial mitral valve regurgitation.  4. The aortic valve is calcified. Aortic valve regurgitation is not visualized. Aortic valve sclerosis/calcification is present, without any evidence of aortic stenosis. FINDINGS  Left Ventricle: Left ventricular ejection fraction, by estimation, is 70 to 75%. The left ventricle has hyperdynamic function. The left ventricle has no regional wall motion abnormalities. Strain was performed and the global longitudinal strain is indeterminate. The left ventricular internal cavity size was normal in size. There is no left ventricular hypertrophy. Left ventricular diastolic parameters are consistent with Grade I diastolic dysfunction (impaired relaxation). Right Ventricle: The right ventricular size is moderately enlarged. Mildly increased right ventricular wall thickness. Right ventricular systolic function is moderately reduced. Left Atrium: Left atrial size was normal in size. Right Atrium: Right atrial size was normal in size. Pericardium: There is no evidence of pericardial effusion. Mitral Valve: The mitral valve is grossly normal. There is mild calcification of the mitral valve leaflet(s). Trivial mitral valve regurgitation. Tricuspid Valve: The tricuspid valve is normal in structure. Tricuspid valve regurgitation is not demonstrated. Aortic Valve: The aortic valve is calcified. Aortic valve regurgitation is not visualized. Aortic valve sclerosis/calcification is present, without any evidence of aortic stenosis. Aortic valve mean gradient measures 3.5 mmHg. Aortic valve peak gradient measures 5.9 mmHg. Aortic valve area, by VTI measures 2.10 cm. Pulmonic Valve: The pulmonic valve was normal in structure. Pulmonic valve regurgitation is not visualized. Aorta: The ascending aorta was not well visualized. IAS/Shunts: No atrial level shunt  detected by color flow Doppler. Agitated saline contrast was given intravenously to evaluate for intracardiac shunting. Additional Comments: 3D was performed not requiring image post processing on an independent workstation and was indeterminate.  LEFT VENTRICLE PLAX 2D LVIDd:         3.30 cm   Diastology LVIDs:         1.90 cm   LV e' medial:    5.11 cm/s LV PW:         0.90 cm   LV E/e' medial:  16.0 LV IVS:        1.10 cm   LV e' lateral:   5.22 cm/s LVOT diam:     2.00 cm   LV E/e' lateral: 15.7 LV SV:         45 LV SV Index:   29 LVOT Area:     3.14 cm  RIGHT VENTRICLE RV Basal diam:  4.10 cm RV Mid diam:    3.70 cm RV S prime:     15.00 cm/s TAPSE (M-mode): 2.5 cm LEFT ATRIUM           Index  RIGHT ATRIUM           Index LA diam:      3.30 cm 2.14 cm/m   RA Area:     10.40 cm LA Vol (A4C): 26.3 ml 17.02 ml/m  RA Volume:   22.30 ml  14.43 ml/m  AORTIC VALVE AV Area (Vmax):    1.99 cm AV Area (Vmean):   2.09 cm AV Area (VTI):     2.10 cm AV Vmax:           121.00 cm/s AV Vmean:          84.700 cm/s AV VTI:            0.214 m AV Peak Grad:      5.9 mmHg AV Mean Grad:      3.5 mmHg LVOT Vmax:         76.60 cm/s LVOT Vmean:        56.400 cm/s LVOT VTI:          0.143 m LVOT/AV VTI ratio: 0.67  AORTA Ao Root diam: 2.50 cm MITRAL VALVE                TRICUSPID VALVE MV Area (PHT): 6.96 cm     TR Peak grad:   37.5 mmHg MV Decel Time: 109 msec     TR Vmax:        306.00 cm/s MV E velocity: 81.80 cm/s MV A velocity: 120.00 cm/s  SHUNTS MV E/A ratio:  0.68         Systemic VTI:  0.14 m                             Systemic Diam: 2.00 cm Cara JONETTA Lovelace MD Electronically signed by Cara JONETTA Lovelace MD Signature Date/Time: 11/10/2023/5:31:35 PM    Final    DG Chest Port 1 View Result Date: 11/09/2023 EXAM: 1 VIEW XRAY OF THE CHEST 11/09/2023 02:13:46 PM COMPARISON: 10/14/2023. CLINICAL HISTORY: SOB, respiratory distress. FINDINGS: LUNGS AND PLEURA: Low lung volumes. Diffuse interstitial prominence, with  basilar predominance, unchanged. Peripheral scarring in the right upper lobe unchanged. Emphysema noted. No pleural effusion. No pneumothorax. HEART AND MEDIASTINUM: Atherosclerotic descending thoracic aorta. Mild cardiomegaly. BONES AND SOFT TISSUES: No acute osseous abnormality. IMPRESSION: 1. Diffuse interstitial prominence with basilar predominance, unchanged. 2. Emphysema. 3. Mild cardiomegaly. 4. Peripheral scarring in the right upper lobe, unchanged. 5. Low lung volumes. 6. Atherosclerotic descending thoracic aorta. Electronically signed by: Ryan Salvage MD 11/09/2023 02:37 PM EDT RP Workstation: [id number]   ECHOCARDIOGRAM COMPLETE Result Date: 10/17/2023    ECHOCARDIOGRAM REPORT   Patient Name:   Susan Sosa Date of Exam: 10/17/2023 Medical Rec #:  [id number]    Height:       59.0 in Accession #:    [id number]   Weight:       132.0 lb Date of Birth:  06-12-1940     BSA:          1.546 m Patient Age:    83 years     BP:           140/76 mmHg Patient Gender: F            HR:           60 bpm. Exam Location:  [facility] Procedure: 2D Echo, Cardiac Doppler and Color Doppler (Both Spectral and Color  Flow Doppler were utilized during procedure). Indications:     Cardiomyopathy-Ischemic I25.5                  Congestive Heart Failure I50.9  History:         Patient has prior history of Echocardiogram examinations, most                  recent 11/10/2022.  Sonographer:     Ashley McNeely-Sloane Referring Phys:  [id number] FUAD ALESKEROV Diagnosing Phys: Keller Paterson IMPRESSIONS  1. Left ventricular ejection fraction, by estimation, is 55 to 60%. The left ventricle has normal function. The left ventricle has no regional wall motion abnormalities. There is mild left ventricular hypertrophy. Left ventricular diastolic parameters are consistent with Grade I diastolic dysfunction (impaired relaxation).  2. Right ventricular systolic function is mildly reduced. The right ventricular size is mildly enlarged.  3.  The mitral valve is normal in structure. Trivial mitral valve regurgitation.  4. The aortic valve is tricuspid. Aortic valve regurgitation is not visualized.  5. The inferior vena cava is normal in size with greater than 50% respiratory variability, suggesting right atrial pressure of 3 mmHg. FINDINGS  Left Ventricle: Left ventricular ejection fraction, by estimation, is 55 to 60%. The left ventricle has normal function. The left ventricle has no regional wall motion abnormalities. The left ventricular internal cavity size was normal in size. There is  mild left ventricular hypertrophy. Left ventricular diastolic parameters are consistent with Grade I diastolic dysfunction (impaired relaxation). Right Ventricle: The right ventricular size is mildly enlarged. No increase in right ventricular wall thickness. Right ventricular systolic function is mildly reduced. Left Atrium: Left atrial size was normal in size. Right Atrium: Right atrial size was normal in size. Pericardium: There is no evidence of pericardial effusion. Mitral Valve: The mitral valve is normal in structure. Mild mitral annular calcification. Trivial mitral valve regurgitation. MV peak gradient, 5.7 mmHg. The mean mitral valve gradient is 2.0 mmHg. Tricuspid Valve: The tricuspid valve is normal in structure. Tricuspid valve regurgitation is trivial. Aortic Valve: The aortic valve is tricuspid. Aortic valve regurgitation is not visualized. Aortic valve mean gradient measures 2.0 mmHg. Aortic valve peak gradient measures 3.3 mmHg. Aortic valve area, by VTI measures 1.45 cm. Pulmonic Valve: The pulmonic valve was not well visualized. Pulmonic valve regurgitation is not visualized. Aorta: The aortic root and ascending aorta are structurally normal, with no evidence of dilitation. Venous: The inferior vena cava is normal in size with greater than 50% respiratory variability, suggesting right atrial pressure of 3 mmHg. IAS/Shunts: The atrial septum is  grossly normal.  LEFT VENTRICLE PLAX 2D LVIDd:         3.50 cm     Diastology LVIDs:         1.90 cm     LV e' medial:    4.79 cm/s LV PW:         1.10 cm     LV E/e' medial:  10.7 LV IVS:        1.00 cm     LV e' lateral:   7.07 cm/s LVOT diam:     1.60 cm     LV E/e' lateral: 7.3 LV SV:         29 LV SV Index:   19 LVOT Area:     2.01 cm  LV Volumes (MOD) LV vol d, MOD A2C: 38.5 ml LV vol d, MOD A4C: 57.6 ml LV vol s, MOD A2C:  15.0 ml LV vol s, MOD A4C: 25.9 ml LV SV MOD A2C:     23.5 ml LV SV MOD A4C:     57.6 ml LV SV MOD BP:      28.7 ml RIGHT VENTRICLE RV Basal diam:  3.30 cm RV Mid diam:    3.85 cm RV S prime:     11.10 cm/s TAPSE (M-mode): 1.6 cm LEFT ATRIUM             Index        RIGHT ATRIUM          Index LA diam:        3.60 cm 2.33 cm/m   RA Area:     9.81 cm LA Vol (A2C):   25.6 ml 16.56 ml/m  RA Volume:   17.50 ml 11.32 ml/m LA Vol (A4C):   30.6 ml 19.80 ml/m LA Biplane Vol: 29.3 ml 18.95 ml/m  AORTIC VALVE                    PULMONIC VALVE AV Area (Vmax):    1.48 cm     PV Vmax:        1.37 m/s AV Area (Vmean):   1.45 cm     PV Vmean:       89.800 cm/s AV Area (VTI):     1.45 cm     PV VTI:         0.274 m AV Vmax:           90.80 cm/s   PV Peak grad:   7.5 mmHg AV Vmean:          63.350 cm/s  PV Mean grad:   4.0 mmHg AV VTI:            0.200 m      RVOT Peak grad: 6 mmHg AV Peak Grad:      3.3 mmHg AV Mean Grad:      2.0 mmHg LVOT Vmax:         66.80 cm/s LVOT Vmean:        45.550 cm/s LVOT VTI:          0.144 m LVOT/AV VTI ratio: 0.72  AORTA Ao Root diam: 3.00 cm Ao Asc diam:  2.40 cm MITRAL VALVE MV Area (PHT): 3.42 cm     SHUNTS MV Area VTI:   1.43 cm     Systemic VTI:  0.14 m MV Peak grad:  5.7 mmHg     Systemic Diam: 1.60 cm MV Mean grad:  2.0 mmHg     Pulmonic VTI:  0.244 m MV Vmax:       1.19 m/s MV Vmean:      57.8 cm/s MV Decel Time: 222 msec MV E velocity: 51.40 cm/s MV A velocity: 104.00 cm/s MV E/A ratio:  0.49 Keller Paterson Electronically signed by Keller Paterson  Signature Date/Time: 10/17/2023/12:38:03 PM    Final    DG Chest Portable 1 View Result Date: 10/14/2023 CLINICAL DATA:  Shortness of breath. EXAM: PORTABLE CHEST 1 VIEW COMPARISON:  Chest x-ray 11/08/2022, chest CT 10/12/2023 FINDINGS: Lungs are somewhat hypoinflated with chronic changes over the lung bases compatible recent CT findings of pulmonary fibrosis. No acute airspace process. No effusion. Cardiomediastinal silhouette and remainder of the exam is unchanged. IMPRESSION: Hypoinflation with chronic changes over the lung bases compatible with recent CT findings of pulmonary fibrosis. Electronically Signed   By: Toribio Agreste M.D.   On:  10/14/2023 17:42   CT CHEST WO CONTRAST Result Date: 10/12/2023 CLINICAL DATA:  Interstitial pulmonary disease EXAM: CT CHEST WITHOUT CONTRAST TECHNIQUE: Multidetector CT imaging of the chest was performed following the standard protocol without IV contrast. RADIATION DOSE REDUCTION: This exam was performed according to the departmental dose-optimization program which includes automated exposure control, adjustment of the mA and/or kV according to patient size and/or use of iterative reconstruction technique. COMPARISON:  04/12/2023 04/11/2018 FINDINGS: Cardiovascular: Aortic atherosclerosis. Cardiomegaly. Enlargement of the main pulmonary artery measuring up to 3.5 cm in caliber. No pericardial effusion. Mediastinum/Nodes: Unchanged prominent mediastinal lymph nodes (series 2, image 52). Thyroid gland, trachea, and esophagus demonstrate no significant findings. Lungs/Pleura: Slight interval worsening of mild to moderate pulmonary fibrosis in a pattern with apical to basal gradient featuring irregular peripheral interstitial opacity, septal thickening, traction bronchiectasis, subpleural bronchiolectasis, and small areas of honeycombing particularly in the right lung base. Fibrotic findings are much more clearly worsened over a longer period of time dating back to  04/11/2018. No significant air trapping on expiratory phase imaging. Moderate underlying emphysema and diffuse bilateral bronchial wall thickening. Upper Abdomen: No acute abnormality. Musculoskeletal: No chest wall abnormality. No acute osseous findings. IMPRESSION: 1. Slight interval worsening of mild to moderate pulmonary fibrosis in a pattern with apical to basal gradient featuring irregular peripheral interstitial opacity, septal thickening, traction bronchiectasis, subpleural bronchiolectasis, and small areas of honeycombing particularly in the right lung base. Fibrotic findings are much more clearly worsened over a longer period of time dating back to 04/11/2018. Given features and progression, findings are consistent with a UIP pattern by ATS criteria. Findings are consistent with UIP per consensus guidelines: Diagnosis of Idiopathic Pulmonary Fibrosis: An Official ATS/ERS/JRS/ALAT Clinical Practice Guideline. Am JINNY Honey Crit Care Med Vol 198, Iss 5, [phone], Sep 25 2016. 2. Moderate underlying emphysema and diffuse bilateral bronchial wall thickening. 3. Cardiomegaly. 4. Enlargement of the main pulmonary artery, as can be seen in pulmonary hypertension. Aortic Atherosclerosis (ICD10-I70.0) and Emphysema (ICD10-J43.9). Electronically Signed   By: Marolyn JONETTA Jaksch M.D.   On: 10/12/2023 14:36    ECHO as above  TELEMETRY (personally reviewed): Sinus rhythm rate 90s  EKG (personally reviewed): NSR rate 90 bpm, t wave inversions similar to prior  Data reviewed by me 11/11/2023: last 24h vitals tele labs imaging I/O ED provider note, admission H&P  Principal Problem:   Acute on chronic respiratory failure (HCC) Active Problems:   Benign essential hypertension   Dyspnea   Hypercholesterolemia   Idiopathic pulmonary fibrosis (HCC)   Sinus tachycardia   Elevated troponin   AKI (acute kidney injury)    ASSESSMENT AND PLAN:  Susan Sosa is a 83 y.o. female  with a past medical history of NICM,  idiopathic pulmonary fibrosis, paroxysmal atrial tachycardia, hypertension, hyperlipidemia who presented to the ED on 11/09/2023 for SOB, hypoxia. Treated for acute on chronic hypoxic respiratory failure. 11/11/2023 was complaining of chest pain, cardiology was consulted for further evaluation.   # Atypical chest pain # Idiopathic pulmonary fibrosis # Non-ischemic cardiomyopathy # Paroxysmal atrial tachycardia # Hypertension # Hyperlipidemia Patient with episodes of atypical chest discomfort, described as intermittent 2 to 3 minutes of mild sharp pain that resolves on its own.  Episodes occur at rest.  Troponins mildly elevated initially on admission, repeats today have been stable at 39 and 39.  LHC 11/2021 with no significant obstructive coronary disease.   -Agree with IV Lasix . -Agree with CTA PE study. -Continue home verapamil  180 mg daily. -Continue aspirin   81 mg daily. -Low suspicion for cardiac cause of chest discomfort.  No plan for further cardiac diagnostics at this time. -Mild and flat troponins most consistent with demand/supply mismatch and not ACS.  This patient's plan of care was discussed and created with Dr. Florencio and he is in agreement.  Signed: Danita Bloch, PA-C  11/11/2023, 11:39 AM [facility]

## 2023-11-11 NOTE — Plan of Care (Signed)

## 2023-11-11 NOTE — Plan of Care (Signed)
 Continuing with plan of care.

## 2023-11-11 NOTE — Progress Notes (Signed)
 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 Recent addition of Imuran due to progressive decline in PFT.  She at times gets respiratory infections but generally bounces back after course of abx and steroid taper.  She has also been on steroids with decadron with partial improvement. 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  Her PFT have also declined over last five years but FEV1 this year has been spared >80% with severe decrement in DLCO as excpected with PH and ILD.  She has a previous history of smoking for appx 40years and does have emphysematous changes overlying her UIP pattern of fibrosis. She is here with acute exacerbation of IPF with acute on chronic hypoxemic respiratory failure. She was noted to have elevated BNP >1343, and did have TTE last month showing impaired diastology.  PCCM consultation placed for management of complex pulmonary patient with advanced IPF with chronic hypoxemia.   11/11/23- patient with improvement overnight now on 10L/min [place] .  CRP markedly elevated consistent with AEIPF, negative viral workup.  Will do CTPE today due to elevated ddimer and hypoxemia to rule out PE.   PAST MEDICAL  HISTORY   Past Medical History:  Diagnosis Date   Arthritis    Heart murmur    Hyperlipidemia    Hypertension    Hyponatremia    IPF (idiopathic pulmonary fibrosis) (HCC)      SURGICAL HISTORY   Past Surgical History:  Procedure Laterality Date   LEFT HEART CATH AND CORONARY ANGIOGRAPHY N/A 11/27/2021   Procedure: LEFT HEART CATH AND CORONARY ANGIOGRAPHY and possible pci and stent;  Surgeon: Florencio Cara BIRCH, MD;  Location: [facility] INVASIVE CV LAB;  Service: Cardiovascular;  Laterality: N/A;   RIGHT HEART CATH Right 08/10/2023   Procedure: RIGHT HEART CATH;  Surgeon: Florencio Cara BIRCH, MD;  Location: [facility] INVASIVE CV LAB;  Service: Cardiovascular;  Laterality: Right;     FAMILY HISTORY   Family History  Problem Relation Age of Onset   Stroke Mother    Coronary artery disease Mother    Coronary artery disease Father    Stroke Father    Diabetes Father      SOCIAL HISTORY   Social History   Tobacco Use   Smoking status: Former   Smokeless tobacco: Never   Tobacco comments:    Quit 30 years ago  Substance Use Topics   Alcohol use: Never   Drug use: Never     MEDICATIONS    Home Medication:    Current Medication:  Current Facility-Administered Medications:    acetaminophen  (TYLENOL ) tablet 650 mg, 650 mg, Oral,  Q6H PRN **OR** acetaminophen  (TYLENOL ) suppository 650 mg, 650 mg, Rectal, Q6H PRN, Cox, Amy N, DO   arformoterol (BROVANA) nebulizer solution 15 mcg, 15 mcg, Nebulization, BID, 15 mcg at 11/11/23 0736 **AND** [DISCONTINUED] budesonide (PULMICORT) nebulizer solution 0.25 mg, 0.25 mg, Nebulization, BID, Von Bellis, MD, 0.25 mg at 11/10/23 9176   aspirin  chewable tablet 81 mg, 81 mg, Oral, QPM, Cox, Amy N, DO, 81 mg at 11/10/23 1800   azaTHIOprine (IMURAN) tablet 50 mg, 50 mg, Oral, BID, Demarr Kluever, MD, 50 mg at 11/11/23 9071   Chlorhexidine Gluconate Cloth 2 % PADS 6 each, 6 each, Topical, Daily, Mansy, Jan A, MD, 6 each at 11/10/23 1000    chlorpheniramine-HYDROcodone (TUSSIONEX) 10-8 MG/5ML suspension 5 mL, 5 mL, Oral, Q12H PRN, Von Bellis, MD   enoxaparin  (LOVENOX ) injection 40 mg, 40 mg, Subcutaneous, QPM, Von Bellis, MD, 40 mg at 11/10/23 1801   furosemide  (LASIX ) injection 40 mg, 40 mg, Intravenous, Daily, Takia Runyon, MD, 40 mg at 11/11/23 0929   guaiFENesin (MUCINEX) 12 hr tablet 600 mg, 600 mg, Oral, BID, Von Bellis, MD, 600 mg at 11/11/23 9071   hydrALAZINE  (APRESOLINE ) injection 5 mg, 5 mg, Intravenous, Q6H PRN, Cox, Amy N, DO   LORazepam (ATIVAN) injection 0.5 mg, 0.5 mg, Intravenous, Q6H PRN, Cox, Amy N, DO   nitroGLYCERIN  (NITROSTAT ) SL tablet 0.4 mg, 0.4 mg, Sublingual, Q5 min PRN, Cox, Amy N, DO   ondansetron  (ZOFRAN ) tablet 4 mg, 4 mg, Oral, Q6H PRN **OR** ondansetron  (ZOFRAN ) injection 4 mg, 4 mg, Intravenous, Q6H PRN, Cox, Amy N, DO   pantoprazole  (PROTONIX ) injection 40 mg, 40 mg, Intravenous, Q12H, Nada Adriana BIRCH, RPH, 40 mg at 11/11/23 9070   Pirfenidone  TABS 534 mg, 534 mg, Oral, TID with meals, Von Bellis, MD, 534 mg at 11/10/23 1800   [COMPLETED] methylPREDNISolone  sodium succinate (SOLU-MEDROL ) 40 mg/mL injection 40 mg, 40 mg, Intravenous, Q12H, 40 mg at 11/10/23 1959 **FOLLOWED BY** [COMPLETED] methylPREDNISolone  sodium succinate (SOLU-MEDROL ) 40 mg/mL injection 40 mg, 40 mg, Intravenous, Q24H, 40 mg at 11/11/23 0929 **FOLLOWED BY** [START ON 11/12/2023] predniSONE  (DELTASONE ) tablet 40 mg, 40 mg, Oral, Q breakfast **FOLLOWED BY** [START ON 11/14/2023] predniSONE  (DELTASONE ) tablet 30 mg, 30 mg, Oral, Q breakfast **FOLLOWED BY** [START ON 11/17/2023] predniSONE  (DELTASONE ) tablet 20 mg, 20 mg, Oral, Q breakfast **FOLLOWED BY** [START ON 11/20/2023] predniSONE  (DELTASONE ) tablet 10 mg, 10 mg, Oral, Q breakfast, Von, Dileep, MD   senna-docusate (Senokot-S) tablet 1 tablet, 1 tablet, Oral, QHS PRN, Cox, Amy N, DO   verapamil  (CALAN -SR) CR tablet 180 mg, 180 mg, Oral, QHS, Belue, Rankin RAMAN, RPH, 180  mg at 11/11/23 0329    ALLERGIES   Latex, Lidocaine, Atorvastatin , Losartan , Metronidazole, and Oxycodone      REVIEW OF SYSTEMS    Review of Systems:  Gen:  Denies  fever, sweats, chills weigh loss  HEENT: Denies blurred vision, double vision, ear pain, eye pain, hearing loss, nose bleeds, sore throat Cardiac:  No dizziness, chest pain or heaviness, chest tightness,edema Resp:   reports dyspnea chronically  Gi: Denies swallowing difficulty, stomach pain, nausea or vomiting, diarrhea, constipation, bowel incontinence Gu:  Denies bladder incontinence, burning urine Ext:   Denies Joint pain, stiffness or swelling Skin: Denies  skin rash, easy bruising or bleeding or hives Endoc:  Denies polyuria, polydipsia , polyphagia or weight change Psych:   Denies depression, insomnia or hallucinations   Other:  All other systems negative   VS: BP 118/64   Pulse 91  Temp 98 F (36.7 C) (Oral)   Resp (!) 31   Ht 4' 11.5 (1.511 m)   Wt 59 kg   SpO2 92%   BMI 25.83 kg/m      PHYSICAL EXAM    GENERAL:NAD, no fevers, chills, no weakness no fatigue HEAD: Normocephalic, atraumatic.  EYES: Pupils equal, round, reactive to light. Extraocular muscles intact. No scleral icterus.  MOUTH: Moist mucosal membrane. Dentition intact. No abscess noted.  EAR, NOSE, THROAT: Clear without exudates. No external lesions.  NECK: Supple. No thyromegaly. No nodules. No JVD.  PULMONARY: decreased breath sounds with mild rhonchi worse at bases bilaterally.  CARDIOVASCULAR: S1 and S2. Regular rate and rhythm. No murmurs, rubs, or gallops. No edema. Pedal pulses 2+ bilaterally.  GASTROINTESTINAL: Soft, nontender, nondistended. No masses. Positive bowel sounds. No hepatosplenomegaly.  MUSCULOSKELETAL: No swelling, clubbing, or edema. Range of motion full in all extremities.  NEUROLOGIC: Cranial nerves II through XII are intact. No gross focal neurological deficits. Sensation intact. Reflexes intact.   SKIN: No ulceration, lesions, rashes, or cyanosis. Skin warm and dry. Turgor intact.  PSYCHIATRIC: Mood, affect within normal limits. The patient is awake, alert and oriented x 3. Insight, judgment intact.       IMAGING   @IMAGES @   ASSESSMENT/PLAN   Acute on chronic hypoxemic respiratory failure       Currently negative for viral LRTI due to negative RVP and SARS/FLU/RSV panel      - inflammatory workup -ddimer, CRP elevated , we will rule out PE Today     - Her blood work showed elevated BNP id like to repeat TTE with bubble study - she sees [facility] cardiology     Idiopathic pulmonary fibrosis with acute exacerbation    - advanced and progressed over past 5 years    - will continue steroids at current dose with taper   Centrilobular emphysema -no signs of acute exacerbation of COPD  - viral workup and inflammatory biomarkers are negative   - duoneb prn is sufficient at this time   -  Pulmonary hypertension associated with pulmonary fibrosis    - will dc tyvasso DPI at home with poor tolerance will switch to [place] going forward  -pre and post capillary per RHC done this year   -PT/OT ordered   Bibasilar atelectasis   - recruitement maneuvers via chest physiotherapy utilizing metaneb with duoneb     - PT /OT  Physical deconditioning   Continue PT/OT , continue chest PT  BODE score >8 with poor long term prognosis   Goals of care     - patient is currently DNR      Thank you for allowing me to participate in the care of this patient.   Patient/Family are satisfied with care plan and all questions have been answered.    Provider disclosure: Patient with at least one acute or chronic illness or injury that poses a threat to life or bodily function and is being managed actively during this encounter.  All of the below services have been performed independently by signing provider:  review of prior documentation from internal and or external health records.  Review  of previous and current lab results.  Interview and comprehensive assessment during patient visit today. Review of current and previous chest radiographs/CT scans. Discussion of management and test interpretation with health care team and patient/family.   This document was prepared using Dragon voice recognition software and may include unintentional dictation errors.     Negar Sieler  Tondalaya Perren, M.D.  Division of Pulmonary & Critical Care Medicine

## 2023-11-11 NOTE — Care Management Important Message (Signed)
 Important Message  Patient Details  Name: Caddie Randle MRN: [id number] Date of Birth: July 26, 1940   Important Message Given:  Yes - Medicare IM     Rojelio [name] 11/11/2023, 3:23 PM

## 2023-11-11 NOTE — Progress Notes (Signed)
 Progress Note   Patient: Susan Sosa FMW:[id number] DOB: Sep 15, 1940 DOA: 11/09/2023     2 DOS: the patient was seen and examined on 11/11/2023   Brief hospital course:   Ms. Annalina Needles is an 83 year old female with history of pulmonary fibrosis with chronic hypoxemic respiratory failure, GERD,, hypertension, hyperlipidemia, who presented from [facility] pulmonology clinic for chief concerns of acute on chronic respiratory failure requiring increased oxygen supplementation.   Vitals in the ED showed t 97.7, rr 29, hr 140, blood pressure 165/86, SpO2 98% on BiPAP.   Serum sodium is 134, potassium 4.1, chloride 93, bicarb 25, BUN of 20, serum creatinine 1.31, eGFR 40, nonfasting blood glucose 174, WBC 0.8, hemoglobin 12.9, platelets of 259.   Lactic acid is 1.9.  HS troponin is 120.   ED treatment: DuoNebs one-time treatment, Solu-Medrol  125 mg IV one-time dose.     Assessment and Plan:   # Acute on chronic respiratory failure  At home patient is on 6 L at baseline O2 sat was 50% on 8 L at [facility] clinic VBG showed mild hypercapnia Patient was placed on BiPAP and admitted to stepdown unit Continue supplemental O2 halogen and gradually wean down to her baseline Use BiPAP as needed Chest CT did not show any P S/p Solu-Medrol  80 mg x 1 dose given Started IV Solu-Medrol  tapering dose Started Brovana nebulizer twice daily Continue DuoNeb every 6 hourly prn Mucinex 600 mg p.o. twice daily, Tussionex as needed Continue PPI for GI prophylaxis Pulmonary consulted, started azathioprine 50 mg p.o. twice daily and Lasix  40 mg IV daily Check RVP panel     # AKI (acute kidney injury) On CKD stage IIIa. BNP 1343 sCr 1.31>>0.96 Strict I's and O's BNP was elevated so Lasix  was started by pulmonary Monitor renal functions and urine output       #Elevated troponin most likely secondary to demand ischemia due to hypoxemia Patient denied any chest pain, S/p heparin  IV infusion,  discontinued  Troponin remained flat. Follow-up TTE Cardiology consulted and at this time chest pain thought to be noncardiac     Hypertension with sinus tachycardia due to hypoxia Continue to monitor on telemetry Use hydralazine  as needed Monitor BP and titrate medications accordingly    Body mass index is 25.83 kg/m.  Interventions:   Diet: Regular diet DVT Prophylaxis: Subcutaneous Lovenox     Advance goals of care discussion: Full code   Family Communication: family was present at bedside, at the time of interview.  The pt provided permission to discuss medical plan with the family. Opportunity was given to ask question and all questions were answered satisfactorily.    Disposition:  Pt is from home, admitted with respiratory failure, still has shortness of breath, which precludes a safe discharge. Discharge to home, when stable, may need few days to improve.   Subjective: Patient seen and examined at bedside this morning Currently on 10 L of intranasal oxygen She uses 6 L at home Still feels very weak Having intermittent chest pain in the setting of elevated troponin so cardiology consulted   Physical Exam: General: NAD, lying comfortably Appear in no distress, affect appropriate Eyes: PERRLA ENT: Oral Mucosa Clear, moist  Neck: no JVD,  Cardiovascular: S1 and S2 Present, no Murmur,  Respiratory: Increased work of breathing, shortness of breath, mild crackles bilaterally, no significant wheezes  Abdomen: Bowel Sound present, Soft and no tenderness,  Skin: no rashes Extremities: no Pedal edema, no calf tenderness Neurologic: without any new focal findings  Gait not checked due to patient safety concerns   Data Reviewed:    Latest Ref Rng & Units 11/11/2023    2:38 AM 11/10/2023    6:39 AM 11/09/2023    1:43 PM  CBC  WBC 4.0 - 10.5 K/uL 8.5  7.3  9.8   Hemoglobin 12.0 - 15.0 g/dL 87.8  88.1  87.0   Hematocrit 36.0 - 46.0 % 35.6  34.4  38.1   Platelets 150 -  400 K/uL 280  225  259        Latest Ref Rng & Units 11/11/2023    2:38 AM 11/10/2023    6:39 AM 11/09/2023    1:43 PM  BMP  Glucose 70 - 99 mg/dL 827  866  825   BUN 8 - 23 mg/dL 36  25  20   Creatinine 0.44 - 1.00 mg/dL 9.00  9.03  8.68   Sodium 135 - 145 mmol/L 135  138  134   Potassium 3.5 - 5.1 mmol/L 3.6  3.9  4.1   Chloride 98 - 111 mmol/L 94  97  93   CO2 22 - 32 mmol/L 30  24  25    Calcium  8.9 - 10.3 mg/dL 8.1  8.3  8.5     Vitals:   11/11/23 1300 11/11/23 1400 11/11/23 1500 11/11/23 1600  BP: 121/64 113/71 (!) 133/107 131/75  Pulse: 94 92 94 91  Resp: (!) 27 (!) 22 (!) 22 18  Temp:      TempSrc:      SpO2: (!) 89% 93% 91% 95%  Weight:      Height:        Author: Drue ONEIDA Potter, MD 11/11/2023 5:48 PM  For on call review [email or web address].

## 2023-11-12 ENCOUNTER — Inpatient Hospital Stay

## 2023-11-12 DIAGNOSIS — J9621 Acute and chronic respiratory failure with hypoxia: Secondary | ICD-10-CM | POA: Diagnosis not present

## 2023-11-12 LAB — BLOOD GAS, VENOUS
Acid-Base Excess: 7.6 mmol/L — ABNORMAL HIGH (ref 0.0–2.0)
Bicarbonate: 32 mmol/L — ABNORMAL HIGH (ref 20.0–28.0)
Patient temperature: 37
pCO2, Ven: 43 mmHg — ABNORMAL LOW (ref 44–60)
pH, Ven: 7.48 — ABNORMAL HIGH (ref 7.25–7.43)

## 2023-11-12 LAB — BASIC METABOLIC PANEL WITH GFR
Anion gap: 17 — ABNORMAL HIGH (ref 5–15)
BUN: 29 mg/dL — ABNORMAL HIGH (ref 8–23)
CO2: 25 mmol/L (ref 22–32)
Calcium: 8.3 mg/dL — ABNORMAL LOW (ref 8.9–10.3)
Chloride: 96 mmol/L — ABNORMAL LOW (ref 98–111)
Creatinine, Ser: 0.88 mg/dL (ref 0.44–1.00)
GFR, Estimated: >60 mL/min (ref >60)
Glucose, Bld: 129 mg/dL — ABNORMAL HIGH (ref 70–99)
Potassium: 3.3 mmol/L — ABNORMAL LOW (ref 3.5–5.1)
Sodium: 138 mmol/L (ref 135–145)

## 2023-11-12 LAB — CBC
HCT: 35.7 % — ABNORMAL LOW (ref 36.0–46.0)
Hemoglobin: 12.1 g/dL (ref 12.0–15.0)
MCH: 30.1 pg (ref 26.0–34.0)
MCHC: 33.9 g/dL (ref 30.0–36.0)
MCV: 88.8 fL (ref 80.0–100.0)
Platelets: 292 K/uL (ref 150–400)
RBC: 4.02 MIL/uL (ref 3.87–5.11)
RDW: 14 % (ref 11.5–15.5)
WBC: 6.8 K/uL (ref 4.0–10.5)
nRBC: 0 % (ref 0.0–0.2)

## 2023-11-12 LAB — URINALYSIS, COMPLETE (UACMP) WITH MICROSCOPIC
Bilirubin Urine: NEGATIVE
Glucose, UA: NEGATIVE mg/dL
Ketones, ur: NEGATIVE mg/dL
Leukocytes,Ua: NEGATIVE
Nitrite: NEGATIVE
Protein, ur: NEGATIVE mg/dL
Specific Gravity, Urine: 1.015 (ref 1.005–1.030)
Squamous Epithelial / HPF: 0 /HPF (ref 0–5)
pH: 5 (ref 5.0–8.0)

## 2023-11-12 LAB — PHOSPHORUS: Phosphorus: 2.7 mg/dL (ref 2.5–4.6)

## 2023-11-12 LAB — MAGNESIUM: Magnesium: 1.8 mg/dL (ref 1.7–2.4)

## 2023-11-12 MED ORDER — PANTOPRAZOLE SODIUM 40 MG IV SOLR
40.0000 mg | INTRAVENOUS | Status: DC
  Administered 2023-11-13: 40 mg via INTRAVENOUS
  Filled 2023-11-12: qty 10

## 2023-11-12 MED ORDER — ACETAMINOPHEN 10 MG/ML IV SOLN
1000.0000 mg | Freq: Four times a day (QID) | INTRAVENOUS | Status: DC
  Administered 2023-11-12 (×2): 1000 mg via INTRAVENOUS
  Filled 2023-11-12 (×3): qty 100

## 2023-11-12 MED ORDER — ORAL CARE MOUTH RINSE: 15.0000 mL | OROMUCOSAL | Status: DC | PRN

## 2023-11-12 NOTE — Plan of Care (Signed)
  Problem: Pain Managment: Goal: General experience of comfort will improve and/or be controlled Outcome: Progressing   Problem: Clinical Measurements: Goal: Ability to maintain clinical measurements within normal limits will improve Outcome: Not Progressing Goal: Will remain free from infection Outcome: Not Progressing Goal: Respiratory complications will improve Outcome: Not Progressing Goal: Cardiovascular complication will be avoided Outcome: Not Progressing   Problem: Activity: Goal: Risk for activity intolerance will decrease Outcome: Not Progressing

## 2023-11-12 NOTE — Progress Notes (Signed)
 The patient's 02 sat dropped all of a sudden into the 70s. She agreed to go on a BIPAP. She's super anxious. Ativan 0.5 mg PRN administered. Patient is still very anxious and restless trying to pull the BIPAP off. RN at the bedside making sure the patient will not remove the mask. Will continue to monitor.

## 2023-11-12 NOTE — Progress Notes (Signed)
 Patient ID: Susan Sosa, female   DOB: [birth year], 83 y.o.   MRN: [id number] [facility] Cardiology    SUBJECTIVE: Patient states she feels somewhat better just woke up no worsening shortness of breath no chest pain no swelling denies any palpitations or tachycardia resting comfortably in bed   Vitals:   11/12/23 0900 11/12/23 1000 11/12/23 1100 11/12/23 1200  BP: 124/69 119/68 (!) 119/55 106/62  Pulse: 83 74 75 89  Resp: 18 17 14 20   Temp:      TempSrc:      SpO2: 99% 100% 96% 94%  Weight:      Height:         Intake/Output Summary (Last 24 hours) at 11/12/2023 1329 Last data filed at 11/12/2023 0650 Gross per 24 hour  Intake 340.11 ml  Output 800 ml  Net -459.89 ml      PHYSICAL EXAM  General: Well developed, well nourished, in no acute distress HEENT:  Normocephalic and atramatic Neck:  No JVD.  Lungs: Clear bilaterally to auscultation and percussion. Heart: HRRR . Normal S1 and S2 without 2/6 sem gallops or murmurs.  Abdomen: Bowel sounds are positive, abdomen soft and non-tender  Msk:  Back normal, normal gait. Normal strength and tone for age. Extremities: No clubbing, cyanosis or edema.   Neuro: Alert and oriented X 3. Psych:  Good affect, responds appropriately   LABS: Basic Metabolic Panel: Recent Labs    11/11/23 0238 11/12/23 0604  NA 135 138  K 3.6 3.3*  CL 94* 96*  CO2 30 25  GLUCOSE 172* 129*  BUN 36* 29*  CREATININE 0.99 0.88  CALCIUM  8.1* 8.3*  MG 1.9 1.8  PHOS 4.9* 2.7   Liver Function Tests: Recent Labs    11/09/23 1343  AST 24  ALT 16  ALKPHOS 54  BILITOT 1.0  PROT 7.1  ALBUMIN 3.3*   No results for input(s): LIPASE, AMYLASE in the last 72 hours. CBC: Recent Labs    11/11/23 0238 11/12/23 0604  WBC 8.5 6.8  HGB 12.1 12.1  HCT 35.6* 35.7*  MCV 89.4 88.8  PLT 280 292   Cardiac Enzymes: No results for input(s): CKTOTAL, CKMB, CKMBINDEX, TROPONINI in the last 72 hours. BNP: Invalid input(s):  POCBNP D-Dimer: Recent Labs    11/10/23 1125  DDIMER 1.07*   Hemoglobin A1C: No results for input(s): HGBA1C in the last 72 hours. Fasting Lipid Panel: No results for input(s): CHOL, HDL, LDLCALC, TRIG, CHOLHDL, LDLDIRECT in the last 72 hours. Thyroid Function Tests: No results for input(s): TSH, T4TOTAL, T3FREE, THYROIDAB in the last 72 hours.  Invalid input(s): FREET3 Anemia Panel: Recent Labs    11/09/23 2138 11/10/23 0839  VITAMINB12 821  --   FOLATE  --  >20.0  TIBC  --  228*  IRON  --  30    CT Angio Chest Pulmonary Embolism (PE) W or WO Contrast Result Date: 11/11/2023 CLINICAL DATA:  IPF, paroxysmal atrial tachycardia, short of breath, hypoxia EXAM: CT ANGIOGRAPHY CHEST WITH CONTRAST TECHNIQUE: Multidetector CT imaging of the chest was performed using the standard protocol during bolus administration of intravenous contrast. Multiplanar CT image reconstructions and MIPs were obtained to evaluate the vascular anatomy. RADIATION DOSE REDUCTION: This exam was performed according to the departmental dose-optimization program which includes automated exposure control, adjustment of the mA and/or kV according to patient size and/or use of iterative reconstruction technique. CONTRAST:  70mL OMNIPAQUE  IOHEXOL  350 MG/ML SOLN COMPARISON:  10/12/2023, 11/09/2023 FINDINGS: Cardiovascular: This is a technically  adequate evaluation of the pulmonary vasculature. No filling defects or pulmonary emboli. Marked dilation of the main pulmonary arteries consistent with pulmonary arterial hypertension, stable. Stable cardiomegaly without pericardial effusion. No evidence of thoracic aortic aneurysm or dissection. Atherosclerosis of the aorta and coronary vasculature. Mediastinum/Nodes: No enlarged mediastinal, hilar, or axillary lymph nodes. Thyroid gland, trachea, and esophagus demonstrate no significant findings. Lungs/Pleura: Stable background emphysema. There is persistent  background scarring and fibrosis, slightly more pronounced at the bases, compatible with given history of IPF. Since the recent exam, there is superimposed development of interlobular septal thickening and scattered ground-glass opacities favoring developing pulmonary edema. No effusion or pneumothorax. Hypoventilatory changes at the lung bases. Central airways are patent. Upper Abdomen: No acute abnormality. Musculoskeletal: No acute or destructive bony abnormalities. Reconstructed images demonstrate no additional findings. Review of the MIP images confirms the above findings. IMPRESSION: 1. No evidence of pulmonary embolus. 2. Continued dilation of the main pulmonary arteries consistent with pulmonary arterial hypertension. 3. Continued background findings compatible with known IPF, with interval development of interlobular septal thickening and scattered ground-glass opacities compatible with superimposed edema. 4. Stable cardiomegaly. 5. Aortic Atherosclerosis (ICD10-I70.0) and Emphysema (ICD10-J43.9). Electronically Signed   By: Ozell Daring M.D.   On: 11/11/2023 16:35   ECHOCARDIOGRAM COMPLETE BUBBLE STUDY Result Date: 11/10/2023    ECHOCARDIOGRAM REPORT   Patient Name:   Susan Sosa Date of Exam: 11/10/2023 Medical Rec #:  [id number]    Height:       59.5 in Accession #:    [id number]   Weight:       130.1 lb Date of Birth:  01/16/1941     BSA:          1.545 m Patient Age:    83 years     BP:           111/62 mmHg Patient Gender: F            HR:           90 bpm. Exam Location:  [facility] Procedure: 2D Echo, Cardiac Doppler, Color Doppler and Saline Contrast Bubble            Study (Both Spectral and Color Flow Doppler were utilized during            procedure). Indications:     CHF-Congestive heart failure, Class I, acute on chronic,                  diastolic.  History:         Patient has prior history of Echocardiogram examinations, most                  recent 10/17/2023. Signs/Symptoms:Murmur; Risk                   Factors:Hypertension.  Sonographer:     Christopher Furnace Referring Phys:  [id number] HALINA PICKING Diagnosing Phys: Margi Edmundson D Lauri Till MD IMPRESSIONS  1. Left ventricular ejection fraction, by estimation, is 70 to 75%. The left ventricle has hyperdynamic function. The left ventricle has no regional wall motion abnormalities. Left ventricular diastolic parameters are consistent with Grade I diastolic dysfunction (impaired relaxation).  2. Right ventricular systolic function is moderately reduced. The right ventricular size is moderately enlarged. Mildly increased right ventricular wall thickness.  3. The mitral valve is grossly normal. Trivial mitral valve regurgitation.  4. The aortic valve is calcified. Aortic valve regurgitation is not visualized. Aortic valve sclerosis/calcification is present, without any evidence  of aortic stenosis. FINDINGS  Left Ventricle: Left ventricular ejection fraction, by estimation, is 70 to 75%. The left ventricle has hyperdynamic function. The left ventricle has no regional wall motion abnormalities. Strain was performed and the global longitudinal strain is indeterminate. The left ventricular internal cavity size was normal in size. There is no left ventricular hypertrophy. Left ventricular diastolic parameters are consistent with Grade I diastolic dysfunction (impaired relaxation). Right Ventricle: The right ventricular size is moderately enlarged. Mildly increased right ventricular wall thickness. Right ventricular systolic function is moderately reduced. Left Atrium: Left atrial size was normal in size. Right Atrium: Right atrial size was normal in size. Pericardium: There is no evidence of pericardial effusion. Mitral Valve: The mitral valve is grossly normal. There is mild calcification of the mitral valve leaflet(s). Trivial mitral valve regurgitation. Tricuspid Valve: The tricuspid valve is normal in structure. Tricuspid valve regurgitation is not demonstrated. Aortic  Valve: The aortic valve is calcified. Aortic valve regurgitation is not visualized. Aortic valve sclerosis/calcification is present, without any evidence of aortic stenosis. Aortic valve mean gradient measures 3.5 mmHg. Aortic valve peak gradient measures 5.9 mmHg. Aortic valve area, by VTI measures 2.10 cm. Pulmonic Valve: The pulmonic valve was normal in structure. Pulmonic valve regurgitation is not visualized. Aorta: The ascending aorta was not well visualized. IAS/Shunts: No atrial level shunt detected by color flow Doppler. Agitated saline contrast was given intravenously to evaluate for intracardiac shunting. Additional Comments: 3D was performed not requiring image post processing on an independent workstation and was indeterminate.  LEFT VENTRICLE PLAX 2D LVIDd:         3.30 cm   Diastology LVIDs:         1.90 cm   LV e' medial:    5.11 cm/s LV PW:         0.90 cm   LV E/e' medial:  16.0 LV IVS:        1.10 cm   LV e' lateral:   5.22 cm/s LVOT diam:     2.00 cm   LV E/e' lateral: 15.7 LV SV:         45 LV SV Index:   29 LVOT Area:     3.14 cm  RIGHT VENTRICLE RV Basal diam:  4.10 cm RV Mid diam:    3.70 cm RV S prime:     15.00 cm/s TAPSE (M-mode): 2.5 cm LEFT ATRIUM           Index        RIGHT ATRIUM           Index LA diam:      3.30 cm 2.14 cm/m   RA Area:     10.40 cm LA Vol (A4C): 26.3 ml 17.02 ml/m  RA Volume:   22.30 ml  14.43 ml/m  AORTIC VALVE AV Area (Vmax):    1.99 cm AV Area (Vmean):   2.09 cm AV Area (VTI):     2.10 cm AV Vmax:           121.00 cm/s AV Vmean:          84.700 cm/s AV VTI:            0.214 m AV Peak Grad:      5.9 mmHg AV Mean Grad:      3.5 mmHg LVOT Vmax:         76.60 cm/s LVOT Vmean:        56.400 cm/s LVOT VTI:  0.143 m LVOT/AV VTI ratio: 0.67  AORTA Ao Root diam: 2.50 cm MITRAL VALVE                TRICUSPID VALVE MV Area (PHT): 6.96 cm     TR Peak grad:   37.5 mmHg MV Decel Time: 109 msec     TR Vmax:        306.00 cm/s MV E velocity: 81.80 cm/s MV A  velocity: 120.00 cm/s  SHUNTS MV E/A ratio:  0.68         Systemic VTI:  0.14 m                             Systemic Diam: 2.00 cm Haris Baack D Alexandrya Chim MD Electronically signed by Cara JONETTA Lovelace MD Signature Date/Time: 11/10/2023/5:31:35 PM    Final      Echo hyperdynamic left ventricular function EF of greater than 70% with grade 1 diastolic dysfunction  TELEMETRY: Normal sinus rhythm rate in the 90s nonspecific ST-T changes:  ASSESSMENT AND PLAN:  Principal Problem:   Acute on chronic respiratory failure (HCC) Active Problems:   Benign essential hypertension   Dyspnea   Hypercholesterolemia   Idiopathic pulmonary fibrosis (HCC)   Sinus tachycardia   Elevated troponin   AKI (acute kidney injury)    Plan Continue supportive respiratory management and care in ICU Known nonischemic cardiomyopathy continue aggressive medical therapy Paroxysmal atrial tachycardia continue rate management control Unlikely ACS continue aspirin  as well as rate control no invasive cardiac procedures recommended Pulmonary fibrosis continue supplemental oxygen as necessary inhalers as necessary agree with pulmonary critical care management for respiratory support Physical therapy to help with strength and balance training Continue hypertension management and control Agree with statin therapy for hyperlipidemia   Cara JONETTA Lovelace, MD 11/12/2023 1:29 PM

## 2023-11-12 NOTE — Plan of Care (Signed)
 Continuing with plan of care.

## 2023-11-12 NOTE — Progress Notes (Signed)
 Patient removed the BIPAP earlier after RT placed on her. She de sat into the 70s and was placed back on the HFNC at 12 L.

## 2023-11-12 NOTE — Progress Notes (Signed)
 Progress Note   Patient: Susan Sosa FMW:[id number] DOB: 08-Jul-1940 DOA: 11/09/2023     3 DOS: the patient was seen and examined on 11/12/2023     Brief hospital course:     [name] is an 83 year old female with history of pulmonary fibrosis with chronic hypoxemic respiratory failure, GERD,, hypertension, hyperlipidemia, who presented from [facility] clinic pulmonology clinic for chief concerns of acute on chronic respiratory failure requiring increased oxygen supplementation.   Vitals in the ED showed t 97.7, rr 29, hr 140, blood pressure 165/86, SpO2 98% on BiPAP.   Serum sodium is 134, potassium 4.1, chloride 93, bicarb 25, BUN of 20, serum creatinine 1.31, eGFR 40, nonfasting blood glucose 174, WBC 0.8, hemoglobin 12.9, platelets of 259.   Lactic acid is 1.9.  HS troponin is 120.   ED treatment: DuoNebs one-time treatment, Solu-Medrol  125 mg IV one-time dose.     Assessment and Plan:   # Acute on chronic respiratory failure  At home patient is on 6 L at baseline O2 sat was 50% on 8 L at [facility] clinic VBG showed mild hypercapnia Patient was placed on BiPAP and admitted to stepdown unit Continue supplemental O2 halogen and gradually wean down to her baseline Use BiPAP as needed Chest CT did not show any PE Status post IV Solu-Medrol  Now undergoing prednisone  tapering Started Brovana nebulizer twice daily Continue DuoNeb every 6 hourly prn Mucinex 600 mg p.o. twice daily, Tussionex as needed Continue PPI for GI prophylaxis Pulmonary consulted, started azathioprine 50 mg p.o. twice daily and Lasix  40 mg IV daily     # AKI (acute kidney injury) On CKD stage IIIa. BNP 1343 sCr 1.31>>0.96 Strict I's and O's BNP was elevated so Lasix  was started by pulmonary Monitor renal functions and urine output   #Elevated troponin most likely secondary to demand ischemia due to hypoxemia Patient denied any chest pain, S/p heparin  IV infusion, discontinued  Troponin remained  flat. Echo showed EF 70 to 75% Cardiology consulted and at this time chest pain thought to be noncardiac   Unexplained fever Patient had a fever of 101.9 around 5 AM today Normal WBC Fever have since resolved I will obtain blood cultures, urinalysis and chest x-ray to rule out any infectious process. We will initiate antibiotics if infectious process is positive  Hypertension with sinus tachycardia due to hypoxia Continue to monitor on telemetry Use hydralazine  as needed Monitor BP and titrate medications accordingly     Body mass index is 25.83 kg/m.  Interventions:   Diet: Regular diet DVT Prophylaxis: Subcutaneous Lovenox     Advance goals of care discussion: Full code   Family Communication: family was present at bedside, at the time of interview.  The pt provided permission to discuss medical plan with the family. Opportunity was given to ask question and all questions were answered satisfactorily.    Disposition:  Pt is from home, admitted with respiratory failure, still has shortness of breath, which precludes a safe discharge. Discharge to home, when stable, may need few days to improve.   Subjective: Patient seen and examined at bedside this morning Currently on 12L of intranasal oxygen She uses 6 L at home Still feels very weak Chest pain is better today   Physical Exam: General: NAD, lying comfortably Appear in no distress, affect appropriate Eyes: PERRLA ENT: Oral Mucosa Clear, moist  Neck: no JVD,  Cardiovascular: S1 and S2 Present, no Murmur,  Respiratory: Increased work of breathing, shortness of breath, mild crackles bilaterally,  no significant wheezes  Abdomen: Bowel Sound present, Soft and no tenderness,  Skin: no rashes Extremities: no Pedal edema, no calf tenderness Neurologic: without any new focal findings Gait not checked due to patient safety concerns   Data Reviewed:    Latest Ref Rng & Units 11/12/2023    6:04 AM 11/11/2023    2:38 AM  11/10/2023    6:39 AM  CBC  WBC 4.0 - 10.5 K/uL 6.8  8.5  7.3   Hemoglobin 12.0 - 15.0 g/dL 87.8  87.8  88.1   Hematocrit 36.0 - 46.0 % 35.7  35.6  34.4   Platelets 150 - 400 K/uL 292  280  225        Latest Ref Rng & Units 11/12/2023    6:04 AM 11/11/2023    2:38 AM 11/10/2023    6:39 AM  BMP  Glucose 70 - 99 mg/dL 870  827  866   BUN 8 - 23 mg/dL 29  36  25   Creatinine 0.44 - 1.00 mg/dL 9.11  9.00  9.03   Sodium 135 - 145 mmol/L 138  135  138   Potassium 3.5 - 5.1 mmol/L 3.3  3.6  3.9   Chloride 98 - 111 mmol/L 96  94  97   CO2 22 - 32 mmol/L 25  30  24    Calcium  8.9 - 10.3 mg/dL 8.3  8.1  8.3      Vitals:   11/12/23 1500 11/12/23 1516 11/12/23 1600 11/12/23 1602  BP:  114/76 (!) 94/46 (!) 93/51  Pulse: 85 (!) 46 79 77  Resp: 20 (!) 21 15 15   Temp:      TempSrc:      SpO2: 98% 94% 97% 98%  Weight:      Height:        Author: Drue ONEIDA Potter, MD 11/12/2023 4:49 PM  For on call review [email or web address].

## 2023-11-12 NOTE — Progress Notes (Signed)
 Temp is 101.9. Notified Dr. Lawence and received an order for IV Tylenol  . Will administer  and continue to monitor.

## 2023-11-13 DIAGNOSIS — J9621 Acute and chronic respiratory failure with hypoxia: Secondary | ICD-10-CM | POA: Diagnosis not present

## 2023-11-13 DIAGNOSIS — J9622 Acute and chronic respiratory failure with hypercapnia: Secondary | ICD-10-CM | POA: Diagnosis not present

## 2023-11-13 LAB — MAGNESIUM: Magnesium: 1.7 mg/dL (ref 1.7–2.4)

## 2023-11-13 LAB — CBC
HCT: 40.7 % (ref 36.0–46.0)
Hemoglobin: 13.4 g/dL (ref 12.0–15.0)
MCH: 30.2 pg (ref 26.0–34.0)
MCHC: 32.9 g/dL (ref 30.0–36.0)
MCV: 91.7 fL (ref 80.0–100.0)
Platelets: 311 K/uL (ref 150–400)
RBC: 4.44 MIL/uL (ref 3.87–5.11)
RDW: 13.9 % (ref 11.5–15.5)
WBC: 8.3 K/uL (ref 4.0–10.5)
nRBC: 0 % (ref 0.0–0.2)

## 2023-11-13 LAB — BASIC METABOLIC PANEL WITH GFR
Anion gap: 16 — ABNORMAL HIGH (ref 5–15)
BUN: 26 mg/dL — ABNORMAL HIGH (ref 8–23)
CO2: 28 mmol/L (ref 22–32)
Calcium: 8.2 mg/dL — ABNORMAL LOW (ref 8.9–10.3)
Chloride: 93 mmol/L — ABNORMAL LOW (ref 98–111)
Creatinine, Ser: 0.87 mg/dL (ref 0.44–1.00)
GFR, Estimated: >60 mL/min (ref >60)
Glucose, Bld: 103 mg/dL — ABNORMAL HIGH (ref 70–99)
Potassium: 2.9 mmol/L — ABNORMAL LOW (ref 3.5–5.1)
Sodium: 137 mmol/L (ref 135–145)

## 2023-11-13 LAB — TROPONIN I (HIGH SENSITIVITY)
Troponin I (High Sensitivity): 28 ng/L — ABNORMAL HIGH (ref <18)
Troponin I (High Sensitivity): 24 ng/L — ABNORMAL HIGH (ref <18)
Troponin I (High Sensitivity): 21 ng/L — ABNORMAL HIGH (ref <18)

## 2023-11-13 LAB — PROCALCITONIN: Procalcitonin: 1.56 ng/mL

## 2023-11-13 LAB — PHOSPHORUS: Phosphorus: 2.5 mg/dL (ref 2.5–4.6)

## 2023-11-13 MED ORDER — POTASSIUM CHLORIDE 10 MEQ/100ML IV SOLN
10.0000 meq | INTRAVENOUS | Status: AC
  Administered 2023-11-13 (×4): 10 meq via INTRAVENOUS
  Filled 2023-11-13 (×4): qty 100

## 2023-11-13 MED ORDER — MAGNESIUM SULFATE 2 GM/50ML IV SOLN
2.0000 g | Freq: Once | INTRAVENOUS | Status: AC
  Administered 2023-11-13: 2 g via INTRAVENOUS
  Filled 2023-11-13: qty 50

## 2023-11-13 MED ORDER — FUROSEMIDE 10 MG/ML IJ SOLN
40.0000 mg | Freq: Once | INTRAMUSCULAR | Status: AC
  Administered 2023-11-13: 40 mg via INTRAVENOUS
  Filled 2023-11-13: qty 4

## 2023-11-13 MED ORDER — MORPHINE SULFATE (PF) 2 MG/ML IV SOLN: 2.0000 mg | INTRAVENOUS | Status: DC | PRN

## 2023-11-13 MED ORDER — ALUM & MAG HYDROXIDE-SIMETH 200-200-20 MG/5ML PO SUSP
30.0000 mL | ORAL | Status: DC | PRN
  Administered 2023-11-13: 30 mL via ORAL
  Filled 2023-11-13: qty 30

## 2023-11-13 MED ORDER — SODIUM CHLORIDE 0.9 % IV SOLN
1.0000 g | INTRAVENOUS | Status: DC
  Administered 2023-11-13 – 2023-11-14 (×2): 1 g via INTRAVENOUS
  Filled 2023-11-13 (×3): qty 10

## 2023-11-13 MED ORDER — POTASSIUM CHLORIDE 20 MEQ PO PACK
40.0000 meq | PACK | ORAL | Status: AC
  Administered 2023-11-13 (×2): 40 meq via ORAL
  Filled 2023-11-13 (×2): qty 2

## 2023-11-13 MED ORDER — PANTOPRAZOLE SODIUM 40 MG IV SOLR
40.0000 mg | Freq: Two times a day (BID) | INTRAVENOUS | Status: DC
  Administered 2023-11-13 – 2023-11-14 (×2): 40 mg via INTRAVENOUS
  Filled 2023-11-13 (×2): qty 10

## 2023-11-13 MED ORDER — SODIUM CHLORIDE 0.9 % IV SOLN
500.0000 mg | INTRAVENOUS | Status: DC
  Administered 2023-11-13 – 2023-11-14 (×2): 500 mg via INTRAVENOUS
  Filled 2023-11-13 (×2): qty 5

## 2023-11-13 NOTE — Plan of Care (Signed)
  Problem: Clinical Measurements: Goal: Ability to maintain clinical measurements within normal limits will improve Outcome: Progressing Goal: Will remain free from infection Outcome: Progressing Goal: Respiratory complications will improve Outcome: Progressing   Problem: Elimination: Goal: Will not experience complications related to urinary retention Outcome: Progressing   Problem: Pain Managment: Goal: General experience of comfort will improve and/or be controlled Outcome: Progressing   Problem: Safety: Goal: Ability to remain free from injury will improve Outcome: Progressing

## 2023-11-13 NOTE — Plan of Care (Signed)
 Continuing with plan of care.

## 2023-11-13 NOTE — Progress Notes (Signed)
 Order received from pulmonary provider to give a dose of Lasix  40mg  IV at 1500 once.

## 2023-11-13 NOTE — Progress Notes (Signed)
 Patient ID: Susan Sosa, female   DOB: 1940-06-06, 83 y.o.   MRN: [id number] [facility] Cardiology    SUBJECTIVE: Patient feeling somewhat improved still short of breath but better than it was before.  Has low-grade temperature denies any chest pain denies any palpitations or tachycardia resting comfortably in bed   Vitals:   11/13/23 0700 11/13/23 0711 11/13/23 0800 11/13/23 0900  BP: 96/61  91/63 116/65  Pulse: 92 90 87 89  Resp: 18 (!) 21 17 (!) 21  Temp:   98.1 F (36.7 C)   TempSrc:   Oral   SpO2: 94% 92% 97% 94%  Weight:      Height:         Intake/Output Summary (Last 24 hours) at 11/13/2023 1051 Last data filed at 11/13/2023 0100 Gross per 24 hour  Intake 100.67 ml  Output 1300 ml  Net -1199.33 ml      PHYSICAL EXAM  General: Well developed, well nourished, in no acute distress HEENT:  Normocephalic and atramatic Neck:  No JVD.  Lungs: Clear bilaterally to auscultation and percussion. Heart: HRRR . Normal S1 and S2 without gallops or murmurs.  Abdomen: Bowel sounds are positive, abdomen soft and non-tender  Msk:  Back normal, normal gait. Normal strength and tone for age. Extremities: No clubbing, cyanosis or edema.   Neuro: Alert and oriented X 3. Psych:  Good affect, responds appropriately   LABS: Basic Metabolic Panel: Recent Labs    11/12/23 0604 11/13/23 0420  NA 138 137  K 3.3* 2.9*  CL 96* 93*  CO2 25 28  GLUCOSE 129* 103*  BUN 29* 26*  CREATININE 0.88 0.87  CALCIUM  8.3* 8.2*  MG 1.8 1.7  PHOS 2.7 2.5   Liver Function Tests: No results for input(s): AST, ALT, ALKPHOS, BILITOT, PROT, ALBUMIN in the last 72 hours. No results for input(s): LIPASE, AMYLASE in the last 72 hours. CBC: Recent Labs    11/12/23 0604 11/13/23 0420  WBC 6.8 8.3  HGB 12.1 13.4  HCT 35.7* 40.7  MCV 88.8 91.7  PLT 292 311   Cardiac Enzymes: No results for input(s): CKTOTAL, CKMB, CKMBINDEX, TROPONINI in the last 72 hours. BNP: Invalid  input(s): POCBNP D-Dimer: Recent Labs    11/10/23 1125  DDIMER 1.07*   Hemoglobin A1C: No results for input(s): HGBA1C in the last 72 hours. Fasting Lipid Panel: No results for input(s): CHOL, HDL, LDLCALC, TRIG, CHOLHDL, LDLDIRECT in the last 72 hours. Thyroid Function Tests: No results for input(s): TSH, T4TOTAL, T3FREE, THYROIDAB in the last 72 hours.  Invalid input(s): FREET3 Anemia Panel: No results for input(s): VITAMINB12, FOLATE, FERRITIN, TIBC, IRON, RETICCTPCT in the last 72 hours.  DG Chest Port 1 View Result Date: 11/12/2023 CLINICAL DATA:  Fever. EXAM: PORTABLE CHEST 1 VIEW COMPARISON:  11/11/2023. FINDINGS: The heart size and mediastinal contours are stable. There is atherosclerotic calcification aorta. Mild airspace disease is noted at the lung bases bilaterally. No effusion or pneumothorax is seen. Surgical changes are noted at the shoulders bilaterally. No acute osseous abnormality. IMPRESSION: Stable mild airspace disease at the lung bases, possibly related to known pulmonary fibrosis and superimposed edema. Electronically Signed   By: Leita Birmingham M.D.   On: 11/12/2023 17:57   CT Angio Chest Pulmonary Embolism (PE) W or WO Contrast Result Date: 11/11/2023 CLINICAL DATA:  IPF, paroxysmal atrial tachycardia, short of breath, hypoxia EXAM: CT ANGIOGRAPHY CHEST WITH CONTRAST TECHNIQUE: Multidetector CT imaging of the chest was performed using the standard protocol during bolus  administration of intravenous contrast. Multiplanar CT image reconstructions and MIPs were obtained to evaluate the vascular anatomy. RADIATION DOSE REDUCTION: This exam was performed according to the departmental dose-optimization program which includes automated exposure control, adjustment of the mA and/or kV according to patient size and/or use of iterative reconstruction technique. CONTRAST:  70mL OMNIPAQUE  IOHEXOL  350 MG/ML SOLN COMPARISON:  10/12/2023,  11/09/2023 FINDINGS: Cardiovascular: This is a technically adequate evaluation of the pulmonary vasculature. No filling defects or pulmonary emboli. Marked dilation of the main pulmonary arteries consistent with pulmonary arterial hypertension, stable. Stable cardiomegaly without pericardial effusion. No evidence of thoracic aortic aneurysm or dissection. Atherosclerosis of the aorta and coronary vasculature. Mediastinum/Nodes: No enlarged mediastinal, hilar, or axillary lymph nodes. Thyroid gland, trachea, and esophagus demonstrate no significant findings. Lungs/Pleura: Stable background emphysema. There is persistent background scarring and fibrosis, slightly more pronounced at the bases, compatible with given history of IPF. Since the recent exam, there is superimposed development of interlobular septal thickening and scattered ground-glass opacities favoring developing pulmonary edema. No effusion or pneumothorax. Hypoventilatory changes at the lung bases. Central airways are patent. Upper Abdomen: No acute abnormality. Musculoskeletal: No acute or destructive bony abnormalities. Reconstructed images demonstrate no additional findings. Review of the MIP images confirms the above findings. IMPRESSION: 1. No evidence of pulmonary embolus. 2. Continued dilation of the main pulmonary arteries consistent with pulmonary arterial hypertension. 3. Continued background findings compatible with known IPF, with interval development of interlobular septal thickening and scattered ground-glass opacities compatible with superimposed edema. 4. Stable cardiomegaly. 5. Aortic Atherosclerosis (ICD10-I70.0) and Emphysema (ICD10-J43.9). Electronically Signed   By: Ozell Daring M.D.   On: 11/11/2023 16:35     Echo hyperdynamic left ventricular function EF between 70 and 75%  TELEMETRY: Sinus rhythm rate of 90 nonspecific ST-T wave changes:  ASSESSMENT AND PLAN:  Principal Problem:   Acute on chronic respiratory failure  (HCC) Active Problems:   Benign essential hypertension   Dyspnea   Hypercholesterolemia   Idiopathic pulmonary fibrosis (HCC)   Sinus tachycardia   Elevated troponin   AKI (acute kidney injury)    Plan Acute on chronic respiratory failure continue inhalers continue supplemental oxygen follow-up with pulmonary Shortness of breath dyspnea continue BiPAP therapy to help with respiratory support Continue Solu-Medrol  steroid support for acute respiratory failure COPD Pulmonary hypertension continue supplemental oxygen and inhalers steroids Currently on Brovana and inhaled therapy Acute on chronic renal insufficiency continue current therapy kidney function much improved Elevated troponin probably consistent with demand ischemia continue conservative management Continue respiratory support for severe COPD pulmonary hypertension with chronic respiratory failure   Cara JONETTA Lovelace, MD,  11/13/2023 10:51 AM

## 2023-11-13 NOTE — Evaluation (Signed)
 Physical Therapy Evaluation Patient Details Name: Susan Sosa MRN: [id number] DOB: [birth year] Today's Date: 11/13/2023  History of Present Illness  Patient is a 83 year old female with acute on chronic respiratory failure requiring increased oxygen supplementation. PMH: pulmonary fibrosis, chronic respiratory failure, GERD, HTN, HLD.  Clinical Impression  Patient is agreeable to PT evaluation. She is eager to move and to go home soon if possible. She walks limited distances at baseline and uses 4 L02. She has a rolling walker that she only needed the 2 days leading up to her hospital stay, otherwise she is independent with walking. She lives with her spouse.  Today the patient is moving well. She was able to stand with minimal assistance for around 4-5 minutes. Pre-gait activity performed with Sp02 no between 89-93% on 10 L02. Mild dyspnea with exertion. Patient also is requesting a wheelchair at discharge for community mobility given limited ambulation endurance at baseline. PT will continue to follow to maximize independence and to decrease caregiver burden.       If plan is discharge home, recommend the following: Assist for transportation;Help with stairs or ramp for entrance;Assistance with cooking/housework   Can travel by Doctor, hospital (measurements PT) (for community mobility)  Recommendations for Other Services       Functional Status Assessment Patient has had a recent decline in their functional status and demonstrates the ability to make significant improvements in function in a reasonable and predictable amount of time.     Precautions / Restrictions Precautions Precautions: Fall Recall of Precautions/Restrictions: Intact Restrictions Weight Bearing Restrictions Per Provider Order: No      Mobility  Bed Mobility Overal bed mobility: Needs Assistance Bed Mobility: Sit to Supine, Supine to Sit     Supine to sit: Contact  guard Sit to supine: Contact guard assist   General bed mobility comments: increased time required.    Transfers Overall transfer level: Needs assistance Equipment used: 1 person hand held assist Transfers: Sit to/from Stand Sit to Stand: Min assist           General transfer comment: steadying assistance provided. cues for initiation    Ambulation/Gait             Pre-gait activities: patient able to stand between 4-5 minutes. marching in place performed briefly monitoring vitals throughout session. Sp02 89% or higher on 10L 02. education for monitoring endurance level and need for rest breaks    Stairs            Wheelchair Mobility     Tilt Bed    Modified Rankin (Stroke Patients Only)       Balance Overall balance assessment: Needs assistance Sitting-balance support: Feet supported Sitting balance-Leahy Scale: Good     Standing balance support: Single extremity supported Standing balance-Leahy Scale: Poor Standing balance comment: hand held assistance for safety. likely to have improved balance with rolling walker for support                             Pertinent Vitals/Pain Pain Assessment Pain Assessment: Faces Faces Pain Scale: Hurts a little bit Pain Location: bilateral knee, chronic pain Pain Descriptors / Indicators: Discomfort Pain Intervention(s): Limited activity within patient's tolerance, Monitored during session, Repositioned    Home Living Family/patient expects to be discharged to:: Private residence Living Arrangements: Spouse/significant other Available Help at Discharge: Family;Available 24 hours/day Type of Home: House  Home Access: Stairs to enter Entrance Stairs-Rails: Left Entrance Stairs-Number of Steps: 4   Home Layout: One level Home Equipment: Grab bars - tub/shower;Grab bars - toilet;Shower seat - built Charity fundraiser (2 wheels);Rollator (4 wheels)      Prior Function Prior Level of Function :  Independent/Modified Independent             Mobility Comments: limited endurance at baseline. 4 L02. no assistive device usually but for the past 2 days has used rolling walker ADLs Comments: at least supervision from spouse for bathing and now has a shower seat. Mod I/independent otherwise     Extremity/Trunk Assessment   Upper Extremity Assessment Upper Extremity Assessment: Overall WFL for tasks assessed    Lower Extremity Assessment Lower Extremity Assessment: Generalized weakness       Communication   Communication Communication: No apparent difficulties    Cognition Arousal: Alert Behavior During Therapy: WFL for tasks assessed/performed   PT - Cognitive impairments: No apparent impairments                         Following commands: Intact       Cueing Cueing Techniques: Verbal cues     General Comments General comments (skin integrity, edema, etc.): patient is eager to go home soon    Exercises     Assessment/Plan    PT Assessment Patient needs continued PT services  PT Problem List Decreased strength;Decreased range of motion;Decreased activity tolerance;Decreased balance;Decreased mobility;Decreased safety awareness;Cardiopulmonary status limiting activity       PT Treatment Interventions DME instruction;Gait training;Stair training;Functional mobility training;Therapeutic activities;Therapeutic exercise;Balance training;Neuromuscular re-education;Cognitive remediation;Patient/family education    PT Goals (Current goals can be found in the Care Plan section)  Acute Rehab PT Goals Patient Stated Goal: to go home in 1-2 days PT Goal Formulation: With patient Time For Goal Achievement: 11/27/23 Potential to Achieve Goals: Fair    Frequency Min 2X/week     Co-evaluation               AM-PAC PT 6 Clicks Mobility  Outcome Measure Help needed turning from your back to your side while in a flat bed without using bedrails?: A  Little Help needed moving from lying on your back to sitting on the side of a flat bed without using bedrails?: A Little Help needed moving to and from a bed to a chair (including a wheelchair)?: A Little Help needed standing up from a chair using your arms (e.g., wheelchair or bedside chair)?: A Little Help needed to walk in hospital room?: A Little Help needed climbing 3-5 steps with a railing? : A Little 6 Click Score: 18    End of Session Equipment Utilized During Treatment: Oxygen Activity Tolerance: Patient tolerated treatment well Patient left: in bed;with call bell/phone within reach Nurse Communication: Mobility status PT Visit Diagnosis: Muscle weakness (generalized) (M62.81);Unsteadiness on feet (R26.81)    Time: 9187-9162 PT Time Calculation (min) (ACUTE ONLY): 25 min   Charges:   PT Evaluation $PT Eval Moderate Complexity: 1 Mod PT Treatments $Therapeutic Activity: 8-22 mins PT General Charges $$ ACUTE PT VISIT: 1 Visit         Randine Essex, PT, MPT   Randine LULLA Essex 11/13/2023, 10:04 AM

## 2023-11-13 NOTE — Progress Notes (Signed)
 PULMONOLOGY         Date: 11/13/2023,   MRN# [id number] Susan Sosa 1940-08-27     AdmissionWeight: 59 kg                 CurrentWeight: 59 kg  Referring provider: Dr Jerelene   CHIEF COMPLAINT:   Acute on chronic hypoxemic respiratory failure   HISTORY OF PRESENT ILLNESS   This is an 83 yo F with hx of chronic lung disease with IPF and chronic hypoxemia, HTN,cardiac dysfunction with AF, GERD who has been declining over past 1-2 years despite aggressive medical therapy.  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 Recent addition of Imuran due to progressive decline in PFT.  She at times gets respiratory infections but generally bounces back after course of abx and steroid taper.  She has also been on steroids with decadron with partial improvement. 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  Her PFT have also declined over last five years but FEV1 this year has been spared >80% with severe decrement in DLCO as excpected with PH and ILD.  She has a previous history of smoking for appx 40years and does have emphysematous changes overlying her UIP pattern of fibrosis. She is here with acute exacerbation of IPF with acute on chronic hypoxemic respiratory failure. She was noted to have elevated BNP >1343, and did have TTE last month showing impaired diastology.  PCCM consultation placed for management of complex pulmonary patient with advanced IPF with chronic hypoxemia.   11/11/23- patient with improvement overnight now on 10L/min [place] .  CRP markedly elevated consistent with AEIPF, negative viral workup.  Will do CTPE today due to elevated ddimer and hypoxemia to rule out PE.   11/12/23- patient  still requires HFNC with episodes of desaturation.  She has family at bedside and friends visiting today.  We discussed her CT chest with no PE and no pneumonia but there is interstitial edema which she seems to be able to diurese well with current diuretics.   PAST MEDICAL HISTORY   Past Medical History:  Diagnosis Date   Arthritis    Heart murmur    Hyperlipidemia    Hypertension    Hyponatremia    IPF (idiopathic pulmonary fibrosis) (HCC)      SURGICAL HISTORY   Past Surgical History:  Procedure Laterality Date   LEFT HEART CATH AND CORONARY ANGIOGRAPHY N/A 11/27/2021   Procedure: LEFT HEART CATH AND CORONARY ANGIOGRAPHY and possible pci and stent;  Surgeon: Florencio Cara BIRCH, MD;  Location: [facility] INVASIVE CV LAB;  Service: Cardiovascular;  Laterality: N/A;   RIGHT HEART CATH Right 08/10/2023   Procedure: RIGHT HEART CATH;  Surgeon: Florencio Cara BIRCH, MD;  Location: [facility] INVASIVE CV LAB;  Service: Cardiovascular;  Laterality: Right;     FAMILY HISTORY   Family History  Problem Relation Age of Onset   Stroke Mother    Coronary artery disease Mother    Coronary artery disease Father    Stroke Father    Diabetes Father      SOCIAL HISTORY   Social History   Tobacco Use   Smoking status: Former   Smokeless tobacco: Never   Tobacco comments:  Quit 30 years ago  Substance Use Topics   Alcohol use: Never   Drug use: Never     MEDICATIONS    Home Medication:    Current Medication:  Current Facility-Administered Medications:    acetaminophen  (TYLENOL ) tablet 650 mg, 650 mg, Oral, Q6H PRN, 650 mg at 11/13/23 0525 **OR** acetaminophen  (TYLENOL ) suppository 650 mg, 650 mg, Rectal, Q6H PRN, Cox, Amy N, DO   alum & mag hydroxide-simeth (MAALOX/MYLANTA) 200-200-20 MG/5ML suspension 30 mL, 30 mL, Oral, Q4H PRN, Von Bellis, MD, 30 mL at 11/13/23 1209   arformoterol (BROVANA) nebulizer solution 15 mcg, 15 mcg, Nebulization, BID, 15 mcg at 11/13/23 1045 **AND**  [DISCONTINUED] budesonide (PULMICORT) nebulizer solution 0.25 mg, 0.25 mg, Nebulization, BID, Von Bellis, MD, 0.25 mg at 11/10/23 9176   aspirin  chewable tablet 81 mg, 81 mg, Oral, QPM, Cox, Amy N, DO, 81 mg at 11/13/23 1707   azaTHIOprine (IMURAN) tablet 50 mg, 50 mg, Oral, BID, Brick Ketcher, MD, 50 mg at 11/13/23 0900   azithromycin (ZITHROMAX) 500 mg in sodium chloride  0.9 % 250 mL IVPB, 500 mg, Intravenous, Q24H, Von Bellis, MD, Stopped at 11/13/23 1502   cefTRIAXone (ROCEPHIN) 1 g in sodium chloride  0.9 % 100 mL IVPB, 1 g, Intravenous, Q24H, Von Bellis, MD, Stopped at 11/13/23 1251   Chlorhexidine Gluconate Cloth 2 % PADS 6 each, 6 each, Topical, Daily, Mansy, Jan A, MD, 6 each at 11/13/23 1600   chlorpheniramine-HYDROcodone (TUSSIONEX) 10-8 MG/5ML suspension 5 mL, 5 mL, Oral, Q12H PRN, Von Bellis, MD   enoxaparin  (LOVENOX ) injection 40 mg, 40 mg, Subcutaneous, QPM, Von Bellis, MD, 40 mg at 11/13/23 1707   furosemide  (LASIX ) injection 40 mg, 40 mg, Intravenous, Daily, Sheriden Archibeque, MD, 40 mg at 11/13/23 0900   guaiFENesin (MUCINEX) 12 hr tablet 600 mg, 600 mg, Oral, BID, Von Bellis, MD, 600 mg at 11/13/23 0901   hydrALAZINE  (APRESOLINE ) injection 5 mg, 5 mg, Intravenous, Q6H PRN, Cox, Amy N, DO   LORazepam (ATIVAN) injection 0.5 mg, 0.5 mg, Intravenous, Q6H PRN, Cox, Amy N, DO, 0.5 mg at 11/12/23 0205   morphine  (PF) 2 MG/ML injection 2 mg, 2 mg, Intravenous, Q4H PRN, Von Bellis, MD   nitroGLYCERIN  (NITROSTAT ) SL tablet 0.4 mg, 0.4 mg, Sublingual, Q5 min PRN, Cox, Amy N, DO   ondansetron  (ZOFRAN ) tablet 4 mg, 4 mg, Oral, Q6H PRN **OR** ondansetron  (ZOFRAN ) injection 4 mg, 4 mg, Intravenous, Q6H PRN, Cox, Amy N, DO   Oral care mouth rinse, 15 mL, Mouth Rinse, PRN, Djan, Drue DASEN, MD   pantoprazole  (PROTONIX ) injection 40 mg, 40 mg, Intravenous, Q12H, Von Bellis, MD   Pirfenidone  TABS 534 mg, 534 mg, Oral, TID with meals, Von Bellis, MD, 534 mg at 11/13/23 1707    [COMPLETED] methylPREDNISolone  sodium succinate (SOLU-MEDROL ) 40 mg/mL injection 40 mg, 40 mg, Intravenous, Q12H, 40 mg at 11/10/23 1959 **FOLLOWED BY** [COMPLETED] methylPREDNISolone  sodium succinate (SOLU-MEDROL ) 40 mg/mL injection 40 mg, 40 mg, Intravenous, Q24H, 40 mg at 11/11/23 0929 **FOLLOWED BY** [COMPLETED] predniSONE  (DELTASONE ) tablet 40 mg, 40 mg, Oral, Q breakfast, 40 mg at 11/13/23 0901 **FOLLOWED BY** [START ON 11/14/2023] predniSONE  (DELTASONE ) tablet 30 mg, 30 mg, Oral, Q breakfast **FOLLOWED BY** [START ON 11/17/2023] predniSONE  (DELTASONE ) tablet 20 mg, 20 mg, Oral, Q breakfast **FOLLOWED BY** [START ON 11/20/2023] predniSONE  (DELTASONE ) tablet 10 mg, 10 mg, Oral, Q breakfast, Von, Dileep, MD   senna-docusate (Senokot-S) tablet 1 tablet, 1 tablet, Oral, QHS PRN, Cox, Amy N, DO   Treprostinil  POWD 16 mcg,  16 mcg, Inhalation, QID, Nada Adriana BIRCH, RPH, 16 mcg at 11/13/23 1707   verapamil  (CALAN -SR) CR tablet 180 mg, 180 mg, Oral, QHS, Dail Rankin RAMAN, RPH, 180 mg at 11/12/23 2126    ALLERGIES   Latex, Lidocaine, Atorvastatin , Losartan , Metronidazole, and Oxycodone      REVIEW OF SYSTEMS    Review of Systems:  Gen:  Denies  fever, sweats, chills weigh loss  HEENT: Denies blurred vision, double vision, ear pain, eye pain, hearing loss, nose bleeds, sore throat Cardiac:  No dizziness, chest pain or heaviness, chest tightness,edema Resp:   reports dyspnea chronically  Gi: Denies swallowing difficulty, stomach pain, nausea or vomiting, diarrhea, constipation, bowel incontinence Gu:  Denies bladder incontinence, burning urine Ext:   Denies Joint pain, stiffness or swelling Skin: Denies  skin rash, easy bruising or bleeding or hives Endoc:  Denies polyuria, polydipsia , polyphagia or weight change Psych:   Denies depression, insomnia or hallucinations   Other:  All other systems negative   VS: BP 106/66   Pulse 82   Temp (!) 97.4 F (36.3 C) (Oral)   Resp 19   Ht 4'  11.5 (1.511 m)   Wt 59 kg   SpO2 97%   BMI 25.83 kg/m      PHYSICAL EXAM    GENERAL:NAD, no fevers, chills, no weakness no fatigue HEAD: Normocephalic, atraumatic.  EYES: Pupils equal, round, reactive to light. Extraocular muscles intact. No scleral icterus.  MOUTH: Moist mucosal membrane. Dentition intact. No abscess noted.  EAR, NOSE, THROAT: Clear without exudates. No external lesions.  NECK: Supple. No thyromegaly. No nodules. No JVD.  PULMONARY: decreased breath sounds with mild rhonchi worse at bases bilaterally.  CARDIOVASCULAR: S1 and S2. Regular rate and rhythm. No murmurs, rubs, or gallops. No edema. Pedal pulses 2+ bilaterally.  GASTROINTESTINAL: Soft, nontender, nondistended. No masses. Positive bowel sounds. No hepatosplenomegaly.  MUSCULOSKELETAL: No swelling, clubbing, or edema. Range of motion full in all extremities.  NEUROLOGIC: Cranial nerves II through XII are intact. No gross focal neurological deficits. Sensation intact. Reflexes intact.  SKIN: No ulceration, lesions, rashes, or cyanosis. Skin warm and dry. Turgor intact.  PSYCHIATRIC: Mood, affect within normal limits. The patient is awake, alert and oriented x 3. Insight, judgment intact.       IMAGING   @IMAGES @   ASSESSMENT/PLAN   Acute on chronic hypoxemic respiratory failure       Currently negative for viral LRTI due to negative RVP and SARS/FLU/RSV panel      - inflammatory workup -ddimer, CRP elevated , we will rule out PE Today     - Her blood work showed elevated BNP id like to repeat TTE with bubble study - she sees [facility] cardiology     Idiopathic pulmonary fibrosis with acute exacerbation    - advanced and progressed over past 5 years    - will continue steroids at current dose with taper   Centrilobular emphysema -no signs of acute exacerbation of COPD  - viral workup and inflammatory biomarkers are negative   - duoneb prn is sufficient at this time   -  Pulmonary hypertension  associated with pulmonary fibrosis    - will dc tyvasso DPI at home with poor tolerance will switch to [place] going forward  -pre and post capillary per RHC done this year   -PT/OT ordered   Bibasilar atelectasis   - recruitement maneuvers via chest physiotherapy utilizing metaneb with duoneb     - PT /OT  Physical  deconditioning   Continue PT/OT , continue chest PT  BODE score >8 with poor long term prognosis   Goals of care     - patient is currently DNR      Thank you for allowing me to participate in the care of this patient.   Patient/Family are satisfied with care plan and all questions have been answered.    Provider disclosure: Patient with at least one acute or chronic illness or injury that poses a threat to life or bodily function and is being managed actively during this encounter.  All of the below services have been performed independently by signing provider:  review of prior documentation from internal and or external health records.  Review of previous and current lab results.  Interview and comprehensive assessment during patient visit today. Review of current and previous chest radiographs/CT scans. Discussion of management and test interpretation with health care team and patient/family.   This document was prepared using Dragon voice recognition software and may include unintentional dictation errors.     Temia Debroux, M.D.  Division of Pulmonary & Critical Care Medicine

## 2023-11-13 NOTE — Progress Notes (Addendum)
 Progress Note   Patient: Susan Sosa FMW:[id number] DOB: [birth year] DOA: 11/09/2023     4 DOS: the patient was seen and examined on 11/13/2023     Brief hospital course:     [name] is an 83 year old female with history of pulmonary fibrosis with chronic hypoxemic respiratory failure, GERD,, hypertension, hyperlipidemia, who presented from [facility] clinic pulmonology clinic for chief concerns of acute on chronic respiratory failure requiring increased oxygen supplementation.   Vitals in the ED showed t 97.7, rr 29, hr 140, blood pressure 165/86, SpO2 98% on BiPAP.   Serum sodium is 134, potassium 4.1, chloride 93, bicarb 25, BUN of 20, serum creatinine 1.31, eGFR 40, nonfasting blood glucose 174, WBC 0.8, hemoglobin 12.9, platelets of 259.   Lactic acid is 1.9.  HS troponin is 120.   ED treatment: DuoNebs one-time treatment, Solu-Medrol  125 mg IV one-time dose.     Assessment and Plan:   # Acute on chronic respiratory failure  At home patient is on 6 L at baseline O2 sat was 50% on 8 L at [facility] clinic VBG showed mild hypercapnia S/p BiPAP and admitted to stepdown unit Continue supplemental O2 halogen and gradually wean down to her baseline Use Cpap prn as per Pulmo and avoid BiPAP to prevent pneumothorax due to history of underlying pulmonary fibrosis  Chest CT did not show any PE S/p IV Solu-Medrol  Now undergoing prednisone  tapering Started Brovana nebulizer twice daily Continue DuoNeb every 6 hourly prn Mucinex 600 mg p.o. twice daily, Tussionex as needed Increased PPI 40 mg BID for GI prophylaxis Pulmonary consulted, started azathioprine 50 mg p.o. twice daily and Lasix  40 mg IV daily     # AKI (acute kidney injury) On CKD stage IIIa. BNP 1343 sCr 1.31>>0.96 Strict I's and O's BNP was elevated so Lasix  was started by pulmonary Monitor renal functions and urine output   # Elevated troponin most likely secondary to demand ischemia due to hypoxemia Patient  denied any chest pain, S/p heparin  IV infusion, discontinued  Troponin remained flat. Echo showed EF 70 to 75% Cardiology consulted and at this time chest pain thought to be noncardiac 10/19 again had chest pressure, slightly elevated troponin most likely demand ischemia due to hypoxemia   # Pneumonia Patient had fever on 10/18 WBC WNL possible due to immunocompromise status  10/19 procalcitonin 1.5, elevated Empirically started antibiotics ceftriaxone and azithromycin Follow blood cultures   # Hypertension with sinus tachycardia due to hypoxia Continue to monitor on telemetry Use hydralazine  as needed Monitor BP and titrate medications accordingly Continue verapamil  180 mg p.o. nightly   Body mass index is 25.83 kg/m.  Interventions:   Diet: Regular diet DVT Prophylaxis: Subcutaneous Lovenox     Advance goals of care discussion: Full code   Family Communication: family was present at bedside, at the time of interview.  The pt provided permission to discuss medical plan with the family. Opportunity was given to ask question and all questions were answered satisfactorily.    Disposition:  Pt is from home, admitted with respiratory failure, still has shortness of breath, which precludes a safe discharge. Discharge to home, when stable, may need few days to improve.   Subjective: Patient seen and examined at bedside this morning Patient uses 6 L oxygen at baseline at home Currently she is on 15 L HFNC and CPAP as needed Patient does not feel any respiratory distress, she was feeling some chest pressure in the epigastric area, feels indigestion.  Denies any radiation  of pain, no palpitations, no any other complaints.    Physical Exam: General: NAD, lying comfortably Appear in no distress, affect appropriate Eyes: PERRLA ENT: Oral Mucosa Clear, moist  Neck: no JVD,  Cardiovascular: S1 and S2 Present, no Murmur,  Respiratory: Increased work of breathing, shortness of  breath, mild crackles bilaterally, no significant wheezes  Abdomen: Bowel Sound present, Soft and no tenderness,  Skin: no rashes Extremities: no Pedal edema, no calf tenderness Neurologic: without any new focal findings Gait not checked due to patient safety concerns   Data Reviewed:    Latest Ref Rng & Units 11/13/2023    4:20 AM 11/12/2023    6:04 AM 11/11/2023    2:38 AM  CBC  WBC 4.0 - 10.5 K/uL 8.3  6.8  8.5   Hemoglobin 12.0 - 15.0 g/dL 86.5  87.8  87.8   Hematocrit 36.0 - 46.0 % 40.7  35.7  35.6   Platelets 150 - 400 K/uL 311  292  280        Latest Ref Rng & Units 11/13/2023    4:20 AM 11/12/2023    6:04 AM 11/11/2023    2:38 AM  BMP  Glucose 70 - 99 mg/dL 896  870  827   BUN 8 - 23 mg/dL 26  29  36   Creatinine 0.44 - 1.00 mg/dL 9.12  9.11  9.00   Sodium 135 - 145 mmol/L 137  138  135   Potassium 3.5 - 5.1 mmol/L 2.9  3.3  3.6   Chloride 98 - 111 mmol/L 93  96  94   CO2 22 - 32 mmol/L 28  25  30    Calcium  8.9 - 10.3 mg/dL 8.2  8.3  8.1      Vitals:   11/13/23 1100 11/13/23 1200 11/13/23 1300 11/13/23 1308  BP: (!) 103/58 (!) 100/53 (!) 115/59   Pulse: 83 80 89 86  Resp: (!) 31 (!) 32 (!) 22 (!) 26  Temp:  98.1 F (36.7 C)    TempSrc:  Oral    SpO2: 97% 99% 95% 95%  Weight:      Height:       Total time spent: 55 minutes  Author: Elvan Sor, MD 11/13/2023 1:44 PM  For on call review [email or web address].

## 2023-11-13 NOTE — Progress Notes (Signed)
 Patient was able to to go back to 15L HFNC and come off of the CPAP, providers notified.

## 2023-11-13 NOTE — Progress Notes (Signed)
 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 Recent addition of Imuran due to progressive decline in PFT.  She at times gets respiratory infections but generally bounces back after course of abx and steroid taper.  She has also been on steroids with decadron with partial improvement. 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  Her PFT have also declined over last five years but FEV1 this year has been spared >80% with severe decrement in DLCO as excpected with PH and ILD.  She has a previous history of smoking for appx 40years and does have emphysematous changes overlying her UIP pattern of fibrosis. She is here with acute exacerbation of IPF with acute on chronic hypoxemic respiratory failure. She was noted to have elevated BNP >1343, and did have TTE last month showing impaired diastology.  PCCM consultation placed for management of complex pulmonary patient with advanced IPF with chronic hypoxemia.   11/11/23- patient with improvement overnight now on 10L/min [employer] .  CRP markedly elevated consistent with AEIPF, negative viral workup.  Will do CTPE today due to elevated ddimer and hypoxemia to rule out PE.   11/12/23- patient  still requires HFNC with episodes of desaturation.  She has family at bedside and friends visiting today.  We discussed her CT chest with no PE and no pneumonia but there is interstitial edema which she seems to be able to diurese well with current diuretics.   11/13/23- patient is on 12L/min bubbler HF, she is on tapering dose of prednisone , imuran home dose, tyvasso home dose and lasix  with an extra dose to day to expedite diuresis.  She feels overall improved. She has HF at home which can deliver 15L/min.    PAST MEDICAL HISTORY   Past Medical History:  Diagnosis Date   Arthritis    Heart murmur    Hyperlipidemia    Hypertension    Hyponatremia    IPF (idiopathic pulmonary fibrosis) (HCC)      SURGICAL HISTORY   Past Surgical History:  Procedure Laterality Date   LEFT HEART CATH AND CORONARY ANGIOGRAPHY N/A 11/27/2021   Procedure: LEFT HEART CATH AND CORONARY ANGIOGRAPHY and possible pci and stent;  Surgeon: Florencio Cara BIRCH, MD;  Location: [facility] INVASIVE CV LAB;  Service: Cardiovascular;  Laterality: N/A;   RIGHT HEART CATH Right 08/10/2023   Procedure: RIGHT HEART CATH;  Surgeon: Florencio Cara BIRCH, MD;  Location: [facility] INVASIVE CV LAB;  Service: Cardiovascular;  Laterality: Right;     FAMILY HISTORY   Family History  Problem Relation Age of Onset   Stroke Mother    Coronary artery disease  Mother    Coronary artery disease Father    Stroke Father    Diabetes Father      SOCIAL HISTORY   Social History   Tobacco Use   Smoking status: Former   Smokeless tobacco: Never   Tobacco comments:    Quit 30 years ago  Substance Use Topics   Alcohol use: Never   Drug use: Never     MEDICATIONS    Home Medication:    Current Medication:  Current Facility-Administered Medications:    acetaminophen  (TYLENOL ) tablet 650 mg, 650 mg, Oral, Q6H PRN, 650 mg at 11/13/23 0525 **OR** acetaminophen  (TYLENOL ) suppository 650 mg, 650 mg, Rectal, Q6H PRN, Cox, Amy N, DO   alum  & mag hydroxide-simeth (MAALOX/MYLANTA) 200-200-20 MG/5ML suspension 30 mL, 30 mL, Oral, Q4H PRN, Von Bellis, MD, 30 mL at 11/13/23 1209   arformoterol (BROVANA) nebulizer solution 15 mcg, 15 mcg, Nebulization, BID, 15 mcg at 11/13/23 1045 **AND** [DISCONTINUED] budesonide (PULMICORT) nebulizer solution 0.25 mg, 0.25 mg, Nebulization, BID, Von Bellis, MD, 0.25 mg at 11/10/23 9176   aspirin  chewable tablet 81 mg, 81 mg, Oral, QPM, Cox, Amy N, DO, 81 mg at 11/13/23 1707   azaTHIOprine (IMURAN) tablet 50 mg, 50 mg, Oral, BID, Mikaila Grunert, MD, 50 mg at 11/13/23 0900   azithromycin (ZITHROMAX) 500 mg in sodium chloride  0.9 % 250 mL IVPB, 500 mg, Intravenous, Q24H, Von Bellis, MD, Stopped at 11/13/23 1502   cefTRIAXone (ROCEPHIN) 1 g in sodium chloride  0.9 % 100 mL IVPB, 1 g, Intravenous, Q24H, Von Bellis, MD, Stopped at 11/13/23 1251   Chlorhexidine Gluconate Cloth 2 % PADS 6 each, 6 each, Topical, Daily, Mansy, Jan A, MD, 6 each at 11/13/23 1600   chlorpheniramine-HYDROcodone (TUSSIONEX) 10-8 MG/5ML suspension 5 mL, 5 mL, Oral, Q12H PRN, Von Bellis, MD   enoxaparin  (LOVENOX ) injection 40 mg, 40 mg, Subcutaneous, QPM, Von Bellis, MD, 40 mg at 11/13/23 1707   furosemide  (LASIX ) injection 40 mg, 40 mg, Intravenous, Daily, Jahmai Finelli, MD, 40 mg at 11/13/23 0900   guaiFENesin (MUCINEX) 12 hr tablet 600 mg, 600 mg, Oral, BID, Von Bellis, MD, 600 mg at 11/13/23 0901   hydrALAZINE  (APRESOLINE ) injection 5 mg, 5 mg, Intravenous, Q6H PRN, Cox, Amy N, DO   LORazepam (ATIVAN) injection 0.5 mg, 0.5 mg, Intravenous, Q6H PRN, Cox, Amy N, DO, 0.5 mg at 11/12/23 0205   morphine  (PF) 2 MG/ML injection 2 mg, 2 mg, Intravenous, Q4H PRN, Von Bellis, MD   nitroGLYCERIN  (NITROSTAT ) SL tablet 0.4 mg, 0.4 mg, Sublingual, Q5 min PRN, Cox, Amy N, DO   ondansetron  (ZOFRAN ) tablet 4 mg, 4 mg, Oral, Q6H PRN **OR** ondansetron  (ZOFRAN ) injection 4 mg, 4 mg, Intravenous, Q6H PRN, Cox, Amy N, DO   Oral  care mouth rinse, 15 mL, Mouth Rinse, PRN, Djan, Drue DASEN, MD   pantoprazole  (PROTONIX ) injection 40 mg, 40 mg, Intravenous, Q12H, Von Bellis, MD   Pirfenidone  TABS 534 mg, 534 mg, Oral, TID with meals, Von Bellis, MD, 534 mg at 11/13/23 1707   [COMPLETED] methylPREDNISolone  sodium succinate (SOLU-MEDROL ) 40 mg/mL injection 40 mg, 40 mg, Intravenous, Q12H, 40 mg at 11/10/23 1959 **FOLLOWED BY** [COMPLETED] methylPREDNISolone  sodium succinate (SOLU-MEDROL ) 40 mg/mL injection 40 mg, 40 mg, Intravenous, Q24H, 40 mg at 11/11/23 0929 **FOLLOWED BY** [COMPLETED] predniSONE  (DELTASONE ) tablet 40 mg, 40 mg, Oral, Q breakfast, 40 mg at 11/13/23 0901 **FOLLOWED BY** [START ON 11/14/2023] predniSONE  (DELTASONE ) tablet 30 mg, 30 mg, Oral, Q breakfast **FOLLOWED BY** [START ON 11/17/2023]  predniSONE  (DELTASONE ) tablet 20 mg, 20 mg, Oral, Q breakfast **FOLLOWED BY** [START ON 11/20/2023] predniSONE  (DELTASONE ) tablet 10 mg, 10 mg, Oral, Q breakfast, Von Bellis, MD   senna-docusate (Senokot-S) tablet 1 tablet, 1 tablet, Oral, QHS PRN, Cox, Amy N, DO   Treprostinil  POWD 16 mcg, 16 mcg, Inhalation, QID, Nada Adriana BIRCH, RPH, 16 mcg at 11/13/23 1707   verapamil  (CALAN -SR) CR tablet 180 mg, 180 mg, Oral, QHS, Dail Rankin RAMAN, RPH, 180 mg at 11/12/23 2126    ALLERGIES   Latex, Lidocaine, Atorvastatin , Losartan , Metronidazole, and Oxycodone      REVIEW OF SYSTEMS    Review of Systems:  Gen:  Denies  fever, sweats, chills weigh loss  HEENT: Denies blurred vision, double vision, ear pain, eye pain, hearing loss, nose bleeds, sore throat Cardiac:  No dizziness, chest pain or heaviness, chest tightness,edema Resp:   reports dyspnea chronically  Gi: Denies swallowing difficulty, stomach pain, nausea or vomiting, diarrhea, constipation, bowel incontinence Gu:  Denies bladder incontinence, burning urine Ext:   Denies Joint pain, stiffness or swelling Skin: Denies  skin rash, easy bruising or bleeding or  hives Endoc:  Denies polyuria, polydipsia , polyphagia or weight change Psych:   Denies depression, insomnia or hallucinations   Other:  All other systems negative   VS: BP 106/66   Pulse 82   Temp (!) 97.4 F (36.3 C) (Oral)   Resp 19   Ht 4' 11.5 (1.511 m)   Wt 59 kg   SpO2 97%   BMI 25.83 kg/m      PHYSICAL EXAM    GENERAL:NAD, no fevers, chills, no weakness no fatigue HEAD: Normocephalic, atraumatic.  EYES: Pupils equal, round, reactive to light. Extraocular muscles intact. No scleral icterus.  MOUTH: Moist mucosal membrane. Dentition intact. No abscess noted.  EAR, NOSE, THROAT: Clear without exudates. No external lesions.  NECK: Supple. No thyromegaly. No nodules. No JVD.  PULMONARY: decreased breath sounds with mild rhonchi worse at bases bilaterally.  CARDIOVASCULAR: S1 and S2. Regular rate and rhythm. No murmurs, rubs, or gallops. No edema. Pedal pulses 2+ bilaterally.  GASTROINTESTINAL: Soft, nontender, nondistended. No masses. Positive bowel sounds. No hepatosplenomegaly.  MUSCULOSKELETAL: No swelling, clubbing, or edema. Range of motion full in all extremities.  NEUROLOGIC: Cranial nerves II through XII are intact. No gross focal neurological deficits. Sensation intact. Reflexes intact.  SKIN: No ulceration, lesions, rashes, or cyanosis. Skin warm and dry. Turgor intact.  PSYCHIATRIC: Mood, affect within normal limits. The patient is awake, alert and oriented x 3. Insight, judgment intact.       IMAGING   @IMAGES @   ASSESSMENT/PLAN   Acute on chronic hypoxemic respiratory failure       Currently negative for viral LRTI due to negative RVP and SARS/FLU/RSV panel      - inflammatory workup -ddimer, CRP elevated , we will rule out PE Today     - Her blood work showed elevated BNP id like to repeat TTE with bubble study - she sees [facility] cardiology     Idiopathic pulmonary fibrosis with acute exacerbation    - advanced and progressed over past 5 years    -  will continue steroids at current dose with taper   Centrilobular emphysema -no signs of acute exacerbation of COPD  - viral workup and inflammatory biomarkers are negative   - duoneb prn is sufficient at this time   -  Pulmonary hypertension associated with pulmonary fibrosis    - will dc tyvasso DPI  at home with poor tolerance will switch to [place] going forward  -pre and post capillary per RHC done this year   -PT/OT ordered   Bibasilar atelectasis   - recruitement maneuvers via chest physiotherapy utilizing metaneb with duoneb     - PT /OT  Physical deconditioning   Continue PT/OT , continue chest PT  BODE score >8 with poor long term prognosis   Goals of care     - patient is currently DNR      Thank you for allowing me to participate in the care of this patient.   Patient/Family are satisfied with care plan and all questions have been answered.    Provider disclosure: Patient with at least one acute or chronic illness or injury that poses a threat to life or bodily function and is being managed actively during this encounter.  All of the below services have been performed independently by signing provider:  review of prior documentation from internal and or external health records.  Review of previous and current lab results.  Interview and comprehensive assessment during patient visit today. Review of current and previous chest radiographs/CT scans. Discussion of management and test interpretation with health care team and patient/family.   This document was prepared using Dragon voice recognition software and may include unintentional dictation errors.     Pearlean Sabina, M.D.  Division of Pulmonary & Critical Care Medicine

## 2023-11-13 NOTE — Progress Notes (Signed)
 At approximately 10:00 am patient complained of chest tightness, EKG obtained, VS:  HR 88, O2 Sat 91% on 10L HFNC, RR 26, B/P 114/61 (78).  Patient soon after dropped down into the mid-80s requiring oxygen to be increased to 15L HFNC. Provider notified and orders placed for CPAP and stat troponin.  Will continue to monitor patient.

## 2023-11-14 DIAGNOSIS — J9621 Acute and chronic respiratory failure with hypoxia: Secondary | ICD-10-CM | POA: Diagnosis not present

## 2023-11-14 DIAGNOSIS — J9622 Acute and chronic respiratory failure with hypercapnia: Secondary | ICD-10-CM | POA: Diagnosis not present

## 2023-11-14 LAB — CBC
HCT: 38.2 % (ref 36.0–46.0)
Hemoglobin: 12.9 g/dL (ref 12.0–15.0)
MCH: 30.4 pg (ref 26.0–34.0)
MCHC: 33.8 g/dL (ref 30.0–36.0)
MCV: 89.9 fL (ref 80.0–100.0)
Platelets: 303 K/uL (ref 150–400)
RBC: 4.25 MIL/uL (ref 3.87–5.11)
RDW: 14.2 % (ref 11.5–15.5)
WBC: 9.3 K/uL (ref 4.0–10.5)
nRBC: 0 % (ref 0.0–0.2)

## 2023-11-14 LAB — BASIC METABOLIC PANEL WITH GFR
Anion gap: 15 (ref 5–15)
BUN: 25 mg/dL — ABNORMAL HIGH (ref 8–23)
CO2: 28 mmol/L (ref 22–32)
Calcium: 8.3 mg/dL — ABNORMAL LOW (ref 8.9–10.3)
Chloride: 92 mmol/L — ABNORMAL LOW (ref 98–111)
Creatinine, Ser: 0.8 mg/dL (ref 0.44–1.00)
GFR, Estimated: >60 mL/min (ref >60)
Glucose, Bld: 115 mg/dL — ABNORMAL HIGH (ref 70–99)
Potassium: 4 mmol/L (ref 3.5–5.1)
Sodium: 135 mmol/L (ref 135–145)

## 2023-11-14 LAB — PHOSPHORUS: Phosphorus: 2.2 mg/dL — ABNORMAL LOW (ref 2.5–4.6)

## 2023-11-14 LAB — CULTURE, BLOOD (ROUTINE X 2)
Culture: NO GROWTH
Culture: NO GROWTH
Special Requests: ADEQUATE

## 2023-11-14 LAB — MAGNESIUM: Magnesium: 2.1 mg/dL (ref 1.7–2.4)

## 2023-11-14 MED ORDER — PANTOPRAZOLE SODIUM 40 MG PO TBEC
40.0000 mg | DELAYED_RELEASE_TABLET | Freq: Two times a day (BID) | ORAL | Status: DC
  Administered 2023-11-14 – 2023-11-16 (×5): 40 mg via ORAL
  Filled 2023-11-14 (×5): qty 1

## 2023-11-14 MED ORDER — MIDODRINE HCL 5 MG PO TABS
10.0000 mg | ORAL_TABLET | Freq: Three times a day (TID) | ORAL | Status: DC | PRN
  Administered 2023-11-14: 10 mg via ORAL
  Filled 2023-11-14: qty 2

## 2023-11-14 MED ORDER — AZITHROMYCIN 250 MG PO TABS
500.0000 mg | ORAL_TABLET | Freq: Every day | ORAL | Status: DC
  Administered 2023-11-15: 500 mg via ORAL
  Filled 2023-11-14: qty 2

## 2023-11-14 MED ORDER — POTASSIUM & SODIUM PHOSPHATES 280-160-250 MG PO PACK
2.0000 | PACK | Freq: Three times a day (TID) | ORAL | Status: AC
  Administered 2023-11-14 (×2): 2 via ORAL
  Filled 2023-11-14 (×2): qty 2

## 2023-11-14 NOTE — Progress Notes (Signed)
 Progress Note   Patient: Susan Sosa FMW:[id number] DOB: 1940/12/18 DOA: 11/09/2023     5 DOS: the patient was seen and examined on 11/14/2023     Brief hospital course:     [name] is an 83 year old female with history of pulmonary fibrosis with chronic hypoxemic respiratory failure, GERD,, hypertension, hyperlipidemia, who presented from [facility] clinic pulmonology clinic for chief concerns of acute on chronic respiratory failure requiring increased oxygen supplementation.   Vitals in the ED showed t 97.7, rr 29, hr 140, blood pressure 165/86, SpO2 98% on BiPAP.   Serum sodium is 134, potassium 4.1, chloride 93, bicarb 25, BUN of 20, serum creatinine 1.31, eGFR 40, nonfasting blood glucose 174, WBC 0.8, hemoglobin 12.9, platelets of 259.   Lactic acid is 1.9.  HS troponin is 120.   ED treatment: DuoNebs one-time treatment, Solu-Medrol  125 mg IV one-time dose.     Assessment and Plan:   # Acute on chronic respiratory failure  At home patient is on 6 L at baseline O2 sat was 50% on 8 L at [facility] clinic VBG showed mild hypercapnia S/p BiPAP and admitted to stepdown unit Continue supplemental O2 halogen and gradually wean down to her baseline Use Cpap prn as per Pulmo and avoid BiPAP to prevent pneumothorax due to history of underlying pulmonary fibrosis  Chest CT did not show any PE S/p IV Solu-Medrol  Now undergoing prednisone  tapering Started Brovana nebulizer twice daily Continue DuoNeb every 6 hourly prn Mucinex 600 mg p.o. twice daily, Tussionex as needed Increased PPI 40 mg BID for GI prophylaxis Pulmonary consulted, started azathioprine 50 mg p.o. twice daily and Lasix  40 mg IV daily     # AKI (acute kidney injury) On CKD stage IIIa. BNP 1343 sCr 1.31>>0.96 Strict I's and O's BNP was elevated so Lasix  was started by pulmonary Monitor renal functions and urine output   # Elevated troponin most likely secondary to demand ischemia due to hypoxemia Patient  denied any chest pain, S/p heparin  IV infusion, discontinued  Troponin remained flat. Echo showed EF 70 to 75% Cardiology consulted and at this time chest pain thought to be noncardiac 10/19 again had chest pressure, slightly elevated troponin most likely demand ischemia due to hypoxemia   # Pneumonia Patient had fever on 10/18 WBC WNL possible due to immunocompromise status  10/19 procalcitonin 1.5, elevated Empirically started antibiotics ceftriaxone and azithromycin Follow blood cultures   # Hypertension with sinus tachycardia due to hypoxia Continue to monitor on telemetry Use hydralazine  as needed Monitor BP and titrate medications accordingly Continue verapamil  180 mg p.o. nightly 10/20 midodrine 10 mg p.o. 3 times daily as needed order placed Monitor BP and titrate medication accordingly  # Hypophosphatemia, Phos repleted. Monitor electrolytes daily and replete as needed.  Body mass index is 25.83 kg/m.  Interventions:   Diet: Regular diet DVT Prophylaxis: Subcutaneous Lovenox     Advance goals of care discussion: Full code   Family Communication: family was present at bedside, at the time of interview.  The pt provided permission to discuss medical plan with the family. Opportunity was given to ask question and all questions were answered satisfactorily.    Disposition:  Pt is from home, admitted with respiratory failure, still has shortness of breath, which precludes a safe discharge. Discharge to home, when stable, may need few days to improve.   Subjective: Patient seen and examined at bedside this morning Patient uses 6 L oxygen at baseline at home Currently she is on 12  L HFNC and CPAP as needed Shortness of breath gradually improving, does not feel any worsening of symptoms.  Denies any chest pain today.    Physical Exam: General: NAD, lying comfortably Appear in no distress, affect appropriate Eyes: PERRLA ENT: Oral Mucosa Clear, moist  Neck: no  JVD,  Cardiovascular: S1 and S2 Present, no Murmur,  Respiratory: Increased work of breathing, shortness of breath, mild crackles bilaterally, no significant wheezes  Abdomen: Bowel Sound present, Soft and no tenderness,  Skin: no rashes Extremities: no Pedal edema, no calf tenderness Neurologic: without any new focal findings Gait not checked due to patient safety concerns   Data Reviewed:    Latest Ref Rng & Units 11/14/2023    4:34 AM 11/13/2023    4:20 AM 11/12/2023    6:04 AM  CBC  WBC 4.0 - 10.5 K/uL 9.3  8.3  6.8   Hemoglobin 12.0 - 15.0 g/dL 87.0  86.5  87.8   Hematocrit 36.0 - 46.0 % 38.2  40.7  35.7   Platelets 150 - 400 K/uL 303  311  292        Latest Ref Rng & Units 11/14/2023    4:34 AM 11/13/2023    4:20 AM 11/12/2023    6:04 AM  BMP  Glucose 70 - 99 mg/dL 884  896  870   BUN 8 - 23 mg/dL 25  26  29    Creatinine 0.44 - 1.00 mg/dL 9.19  9.12  9.11   Sodium 135 - 145 mmol/L 135  137  138   Potassium 3.5 - 5.1 mmol/L 4.0  2.9  3.3   Chloride 98 - 111 mmol/L 92  93  96   CO2 22 - 32 mmol/L 28  28  25    Calcium  8.9 - 10.3 mg/dL 8.3  8.2  8.3      Vitals:   11/14/23 1100 11/14/23 1157 11/14/23 1200 11/14/23 1300  BP: (!) 113/56  (!) 92/59 (!) 91/55  Pulse: 92  94 98  Resp: (!) 25  (!) 24 20  Temp:  98.2 F (36.8 C)    TempSrc:  Oral    SpO2: 93%  92% 92%  Weight:      Height:       Total time spent: 55 minutes  Author: Elvan Sor, MD 11/14/2023 2:06 PM  For on call review [email or web address].

## 2023-11-14 NOTE — Progress Notes (Signed)
 Pharmacy informed RN that patient only has 4 doses remaining of her home supplied medication Treprostinil  POWD.   RN informed patient and patient advised she will reach out to her family to request additional doses be brought in to the hospital.

## 2023-11-14 NOTE — Evaluation (Signed)
 Occupational Therapy Evaluation Patient Details Name: Susan Sosa MRN: [id number] DOB: 1940-06-29 Today's Date: 11/14/2023   History of Present Illness   Patient is a 83 year old female with acute on chronic respiratory failure requiring increased oxygen supplementation. PMH: pulmonary fibrosis, chronic respiratory failure, GERD, HTN, HLD.     Clinical Impressions Patient presenting with decreased Ind in self care,balance, functional mobility/transfers, endurance, and safety awareness. Patient reports living at home with husband and using RW for mobility. Pt endorses being Ind with self care tasks and using 4-6Ls of O2 at baseline. She is currently on 8Ls during this evaluation. Patient currently functioning at min HHA for bed mobility, functional transfers, and ambulating 8' forwards and back. Pt fatigues quickly and returns to sit in recliner chair. Patient will benefit from acute OT to increase overall independence in the areas of ADLs, functional mobility, and safety awareness in order to safely discharge.     If plan is discharge home, recommend the following:   A little help with walking and/or transfers;A little help with bathing/dressing/bathroom;Assistance with cooking/housework;Direct supervision/assist for financial management;Assist for transportation;Help with stairs or ramp for entrance;Supervision due to cognitive status     Functional Status Assessment   Patient has had a recent decline in their functional status and demonstrates the ability to make significant improvements in function in a reasonable and predictable amount of time.     Equipment Recommendations   BSC/3in1      Precautions/Restrictions   Precautions Precautions: Fall Recall of Precautions/Restrictions: Intact     Mobility Bed Mobility Overal bed mobility: Needs Assistance Bed Mobility: Supine to Sit     Supine to sit: Contact guard          Transfers Overall transfer level:  Needs assistance Equipment used: 1 person hand held assist Transfers: Sit to/from Stand, Bed to chair/wheelchair/BSC Sit to Stand: Min assist     Step pivot transfers: Min assist            Balance Overall balance assessment: Needs assistance Sitting-balance support: Feet supported Sitting balance-Leahy Scale: Good     Standing balance support: Single extremity supported Standing balance-Leahy Scale: Fair                             ADL either performed or assessed with clinical judgement   ADL Overall ADL's : Needs assistance/impaired                         Toilet Transfer: Minimal assistance Toilet Transfer Details (indicate cue type and reason): simulated                 Vision Patient Visual Report: No change from baseline              Pertinent Vitals/Pain Pain Assessment Pain Assessment: Faces Faces Pain Scale: Hurts little more Pain Location: bilateral knee, chronic pain Pain Descriptors / Indicators: Discomfort, Aching Pain Intervention(s): Limited activity within patient's tolerance, Monitored during session, Premedicated before session, Repositioned     Extremity/Trunk Assessment Upper Extremity Assessment Upper Extremity Assessment: Overall WFL for tasks assessed   Lower Extremity Assessment Lower Extremity Assessment: Generalized weakness       Communication Communication Communication: No apparent difficulties   Cognition Arousal: Alert Behavior During Therapy: WFL for tasks assessed/performed Cognition: No apparent impairments  Following commands: Intact       Cueing  General Comments   Cueing Techniques: Verbal cues              Home Living Family/patient expects to be discharged to:: Private residence Living Arrangements: Spouse/significant other Available Help at Discharge: Family;Available 24 hours/day Type of Home: House Home Access: Stairs to  enter [employer] of Steps: 4 Entrance Stairs-Rails: Left Home Layout: One level     Bathroom Shower/Tub: Producer, television/film/video: Handicapped height     Home Equipment: Grab bars - tub/shower;Grab bars - toilet;Shower seat - built [employer] fundraiser (2 wheels);Rollator (4 wheels)          Prior Functioning/Environment Prior Level of Function : Independent/Modified Independent             Mobility Comments: limited endurance at baseline. 4 L02. no assistive device usually but for the past 2 days has used rolling walker ADLs Comments: at least supervision from spouse for bathing and now has a shower seat. Mod I/independent otherwise    OT Problem List: Decreased strength;Decreased knowledge of use of DME or AE;Decreased activity tolerance;Impaired UE functional use;Impaired balance (sitting and/or standing);Decreased safety awareness   OT Treatment/Interventions: Self-care/ADL training;Manual therapy;Therapeutic exercise;Patient/family education;Balance training;Energy conservation;Therapeutic activities;DME and/or AE instruction;Cognitive remediation/compensation      OT Goals(Current goals can be found in the care plan section)   Acute Rehab OT Goals Patient Stated Goal: to go home OT Goal Formulation: With patient/family Time For Goal Achievement: 11/28/23 Potential to Achieve Goals: Fair ADL Goals Pt Will Perform Grooming: with modified independence;standing Pt Will Perform Lower Body Dressing: with modified independence;sit to/from stand Pt Will Transfer to Toilet: with modified independence;ambulating Pt Will Perform Toileting - Clothing Manipulation and hygiene: with modified independence;sit to/from stand   OT Frequency:  Min 2X/week       AM-PAC OT 6 Clicks Daily Activity     Outcome Measure Help from another person eating meals?: None Help from another person taking care of personal grooming?: A Little Help from another person  toileting, which includes using toliet, bedpan, or urinal?: A Little Help from another person bathing (including washing, rinsing, drying)?: A Little Help from another person to put on and taking off regular upper body clothing?: A Little Help from another person to put on and taking off regular lower body clothing?: A Little 6 Click Score: 19   End of Session Equipment Utilized During Treatment: Oxygen Nurse Communication: Mobility status  Activity Tolerance: Patient limited by fatigue Patient left: in chair;with call bell/phone within reach;with family/visitor present  OT Visit Diagnosis: Unsteadiness on feet (R26.81);Repeated falls (R29.6);Muscle weakness (generalized) (M62.81)                Time: 8474-8455 OT Time Calculation (min): 19 min Charges:  OT General Charges $OT Visit: 1 Visit OT Evaluation $OT Eval Moderate Complexity: 1 Mod OT Treatments $Therapeutic Activity: 8-22 mins  Izetta Claude, MS, OTR/L , CBIS ascom [phone]  11/14/23, 5:01 PM

## 2023-11-14 NOTE — Progress Notes (Signed)
 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 Recent addition of Imuran due to progressive decline in PFT.  She at times gets respiratory infections but generally bounces back after course of abx and steroid taper.  She has also been on steroids with decadron with partial improvement. 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  Her PFT have also declined over last five years but FEV1 this year has been spared >80% with severe decrement in DLCO as excpected with PH and ILD.  She has a previous history of smoking for appx 40years and does have emphysematous changes overlying her UIP pattern of fibrosis. She is here with acute exacerbation of IPF with acute on chronic hypoxemic respiratory failure. She was noted to have elevated BNP >1343, and did have TTE last month showing impaired diastology.  PCCM consultation placed for management of complex pulmonary patient with advanced IPF with chronic hypoxemia.   11/11/23- patient with improvement overnight now on 10L/min [place] .  CRP markedly elevated consistent with AEIPF, negative viral workup.  Will do CTPE today due to elevated ddimer and hypoxemia to rule out PE.   11/12/23- patient  still requires HFNC with episodes of desaturation.  She has family at bedside and friends visiting today.  We discussed her CT chest with no PE and no pneumonia but there is interstitial edema which she seems to be able to diurese well with current diuretics.   11/13/23- patient is on 12L/min bubbler HF, she is on tapering dose of prednisone , imuran home dose, tyvasso home dose and lasix  with an extra dose to day to expedite diuresis.  She feels overall improved. She has HF at home which can deliver 15L/min.    11/14/23- patient is down to 10L overnight but had episode of desaturation. We repositioned her and she improved.  She will continue to diurese and work with PT today for safe discharge once closer to baseline.   PAST MEDICAL HISTORY   Past Medical History:  Diagnosis Date   Arthritis    Heart murmur    Hyperlipidemia    Hypertension    Hyponatremia    IPF (idiopathic pulmonary fibrosis) (HCC)      SURGICAL HISTORY   Past Surgical History:  Procedure Laterality Date   LEFT HEART CATH AND CORONARY ANGIOGRAPHY N/A 11/27/2021   Procedure: LEFT HEART CATH AND CORONARY ANGIOGRAPHY and possible pci and stent;  Surgeon: Florencio Cara BIRCH, MD;  Location: [facility];  Service: Cardiovascular;  Laterality: N/A;   RIGHT HEART CATH Right 08/10/2023   Procedure: RIGHT HEART CATH;  Surgeon: Florencio Cara BIRCH, MD;  Location: [facility];  Service: Cardiovascular;  Laterality: Right;     FAMILY HISTORY   Family History  Problem Relation Age of Onset   Stroke Mother    Coronary artery disease Mother    Coronary artery disease Father    Stroke Father    Diabetes Father      SOCIAL HISTORY   Social History   Tobacco Use   Smoking status: Former   Smokeless tobacco: Never   Tobacco comments:    Quit 30 years ago  Substance Use Topics   Alcohol use: Never   Drug use: Never     MEDICATIONS    Home Medication:    Current Medication:  Current  Facility-Administered Medications:    acetaminophen  (TYLENOL ) tablet 650 mg, 650 mg, Oral, Q6H PRN, 650 mg at 11/13/23 0525 **OR** acetaminophen  (TYLENOL ) suppository 650 mg, 650 mg, Rectal, Q6H PRN, Cox, Amy N, DO   alum & mag hydroxide-simeth (MAALOX/MYLANTA) 200-200-20 MG/5ML suspension 30 mL, 30 mL, Oral, Q4H PRN, Von Bellis, MD, 30 mL at 11/13/23 1209   arformoterol (BROVANA) nebulizer solution 15 mcg, 15 mcg, Nebulization, BID, 15 mcg at 11/14/23 0738 **AND** [DISCONTINUED] budesonide (PULMICORT) nebulizer solution 0.25 mg, 0.25 mg, Nebulization, BID, Von Bellis, MD, 0.25 mg at 11/10/23 9176   aspirin  chewable tablet 81 mg, 81 mg, Oral, QPM, Cox, Amy N, DO, 81 mg at 11/13/23 1707   azaTHIOprine (IMURAN) tablet 50 mg, 50 mg, Oral, BID, Leiani Enright, MD, 50 mg at 11/13/23 2225   azithromycin (ZITHROMAX) 500 mg in sodium chloride  0.9 % 250 mL IVPB, 500 mg, Intravenous, Q24H, Von Bellis, MD, Stopped at 11/13/23 1502   cefTRIAXone (ROCEPHIN) 1 g in sodium chloride  0.9 % 100 mL IVPB, 1 g, Intravenous, Q24H, Von Bellis, MD, Stopped at 11/13/23 1251   Chlorhexidine Gluconate Cloth 2 % PADS 6 each, 6 each, Topical, Daily, Mansy, Jan A, MD, 6 each at 11/13/23 1600   chlorpheniramine-HYDROcodone (TUSSIONEX) 10-8 MG/5ML suspension 5 mL, 5 mL, Oral, Q12H PRN, Von Bellis, MD   enoxaparin  (LOVENOX ) injection 40 mg, 40 mg, Subcutaneous, QPM, Von Bellis, MD, 40 mg at 11/13/23 1707   furosemide  (LASIX ) injection 40 mg, 40 mg, Intravenous, Daily, Markas Aldredge, MD, 40 mg at 11/13/23 0900   guaiFENesin (MUCINEX) 12 hr tablet 600 mg, 600 mg, Oral, BID, Von Bellis, MD, 600 mg at 11/13/23 2224   hydrALAZINE  (APRESOLINE ) injection 5 mg, 5 mg, Intravenous, Q6H PRN, Cox, Amy N, DO   LORazepam (ATIVAN) injection 0.5 mg, 0.5 mg, Intravenous, Q6H PRN, Cox, Amy N, DO, 0.5 mg at 11/12/23 0205   morphine  (PF) 2 MG/ML injection 2 mg, 2 mg, Intravenous, Q4H PRN, Von Bellis, MD   nitroGLYCERIN   (NITROSTAT ) SL tablet 0.4 mg, 0.4 mg, Sublingual, Q5 min PRN, Cox, Amy N, DO   ondansetron  (ZOFRAN ) tablet 4 mg, 4 mg, Oral, Q6H PRN **OR** ondansetron  (ZOFRAN ) injection 4 mg, 4 mg, Intravenous, Q6H PRN, Cox, Amy N, DO   Oral care mouth rinse, 15 mL, Mouth Rinse, PRN, Djan, Drue DASEN, MD   pantoprazole  (PROTONIX ) injection 40 mg, 40 mg, Intravenous, Q12H, Von Bellis, MD, 40 mg at 11/13/23 2221   Pirfenidone  TABS 534 mg, 534 mg, Oral, TID with meals, Von Bellis, MD, 534 mg at 11/13/23 1707   [COMPLETED] methylPREDNISolone  sodium succinate (SOLU-MEDROL ) 40 mg/mL injection 40 mg, 40 mg, Intravenous, Q12H, 40 mg at 11/10/23 1959 **FOLLOWED BY** [COMPLETED] methylPREDNISolone  sodium succinate (SOLU-MEDROL ) 40 mg/mL injection 40 mg, 40 mg, Intravenous, Q24H, 40  mg at 11/11/23 0929 **FOLLOWED BY** [COMPLETED] predniSONE  (DELTASONE ) tablet 40 mg, 40 mg, Oral, Q breakfast, 40 mg at 11/13/23 0901 **FOLLOWED BY** predniSONE  (DELTASONE ) tablet 30 mg, 30 mg, Oral, Q breakfast **FOLLOWED BY** [START ON 11/17/2023] predniSONE  (DELTASONE ) tablet 20 mg, 20 mg, Oral, Q breakfast **FOLLOWED BY** [START ON 11/20/2023] predniSONE  (DELTASONE ) tablet 10 mg, 10 mg, Oral, Q breakfast, Von Bellis, MD   senna-docusate (Senokot-S) tablet 1 tablet, 1 tablet, Oral, QHS PRN, Cox, Amy N, DO   Treprostinil  POWD 16 mcg, 16 mcg, Inhalation, QID, Nada Adriana BIRCH, RPH, 16 mcg at 11/13/23 2223   verapamil  (CALAN -SR) CR tablet 180 mg, 180 mg, Oral, QHS, Dail Rankin RAMAN, RPH, 180 mg at 11/13/23 2224    ALLERGIES   Latex, Lidocaine, Atorvastatin , Losartan , Metronidazole, and Oxycodone      REVIEW OF SYSTEMS    Review of Systems:  Gen:  Denies  fever, sweats, chills weigh loss  HEENT: Denies blurred vision, double vision, ear pain, eye pain, hearing loss, nose bleeds, sore throat Cardiac:  No dizziness, chest pain or heaviness, chest tightness,edema Resp:   reports dyspnea chronically  Gi: Denies swallowing difficulty,  stomach pain, nausea or vomiting, diarrhea, constipation, bowel incontinence Gu:  Denies bladder incontinence, burning urine Ext:   Denies Joint pain, stiffness or swelling Skin: Denies  skin rash, easy bruising or bleeding or hives Endoc:  Denies polyuria, polydipsia , polyphagia or weight change Psych:   Denies depression, insomnia or hallucinations   Other:  All other systems negative   VS: BP 129/72 (BP Location: Left Arm)   Pulse 97   Temp 98.6 F (37 C) (Oral)   Resp (!) 25   Ht 4' 11.5 (1.511 m)   Wt 59 kg   SpO2 92%   BMI 25.83 kg/m      PHYSICAL EXAM    GENERAL:NAD, no fevers, chills, no weakness no fatigue HEAD: Normocephalic, atraumatic.  EYES: Pupils equal, round, reactive to light. Extraocular muscles intact. No scleral icterus.  MOUTH: Moist mucosal membrane. Dentition intact. No abscess noted.  EAR, NOSE, THROAT: Clear without exudates. No external lesions.  NECK: Supple. No thyromegaly. No nodules. No JVD.  PULMONARY: decreased breath sounds with mild rhonchi worse at bases bilaterally.  CARDIOVASCULAR: S1 and S2. Regular rate and rhythm. No murmurs, rubs, or gallops. No edema. Pedal pulses 2+ bilaterally.  GASTROINTESTINAL: Soft, nontender, nondistended. No masses. Positive bowel sounds. No hepatosplenomegaly.  MUSCULOSKELETAL: No swelling, clubbing, or edema. Range of motion full in all extremities.  NEUROLOGIC: Cranial nerves II through XII are intact. No gross focal neurological deficits. Sensation intact. Reflexes intact.  SKIN: No ulceration, lesions, rashes, or cyanosis. Skin warm and dry. Turgor intact.  PSYCHIATRIC: Mood, affect within normal limits. The patient is awake, alert and oriented x 3. Insight, judgment intact.       IMAGING   @IMAGES @   ASSESSMENT/PLAN   Acute on chronic hypoxemic respiratory failure       Currently negative for viral LRTI due to negative RVP and SARS/FLU/RSV panel      - inflammatory workup -ddimer, CRP elevated  , we will rule out PE Today     - Her blood work showed elevated BNP id like to repeat TTE with bubble study - she sees [facility] cardiology     Idiopathic pulmonary fibrosis with acute exacerbation    - advanced and progressed over past 5 years    - will continue steroids at current dose with taper   Centrilobular emphysema -no  signs of acute exacerbation of COPD  - viral workup and inflammatory biomarkers are negative   - duoneb prn is sufficient at this time   -  Pulmonary hypertension associated with pulmonary fibrosis    - will dc tyvasso DPI at home with poor tolerance will switch to [place] going forward  -pre and post capillary per RHC done this year   -PT/OT ordered   Bibasilar atelectasis   - recruitement maneuvers via chest physiotherapy utilizing metaneb with duoneb     - PT /OT  Physical deconditioning   Continue PT/OT , continue chest PT  BODE score >8 with poor long term prognosis   Goals of care     - patient is currently DNR      Thank you for allowing me to participate in the care of this patient.   Patient/Family are satisfied with care plan and all questions have been answered.    Provider disclosure: Patient with at least one acute or chronic illness or injury that poses a threat to life or bodily function and is being managed actively during this encounter.  All of the below services have been performed independently by signing provider:  review of prior documentation from internal and or external health records.  Review of previous and current lab results.  Interview and comprehensive assessment during patient visit today. Review of current and previous chest radiographs/CT scans. Discussion of management and test interpretation with health care team and patient/family.   This document was prepared using Dragon voice recognition software and may include unintentional dictation errors.     Kimball Manske, M.D.  Division of Pulmonary & Critical Care  Medicine

## 2023-11-14 NOTE — Progress Notes (Signed)
 [facility] CARDIOLOGY PROGRESS NOTE       Patient ID: Susan Sosa MRN: [id number] DOB/AGE: 1940/05/09 83 y.o.  Admit date: 11/09/2023 Referring Physician Dr. Drue Potter Primary Physician Alla Amis, MD  Primary Cardiologist Dr. Rolanda Maiden, NP Reason for Consultation chest pain  HPI: Susan Sosa is a 83 y.o. female  with a past medical history of NICM, idiopathic pulmonary fibrosis, paroxysmal atrial tachycardia, hypertension, hyperlipidemia who presented to the ED on 11/09/2023 for SOB, hypoxia. Treated for acute on chronic hypoxic respiratory failure. 11/11/2023 was complaining of chest pain, cardiology was consulted for further evaluation.   Interval history: -Patient seen and examined this AM, resting in bed.  -Denies recurrence of CP. States SOB is improving.  -BP and HR stable.   Review of systems complete and found to be negative unless listed above    Past Medical History:  Diagnosis Date   Arthritis    Heart murmur    Hyperlipidemia    Hypertension    Hyponatremia    IPF (idiopathic pulmonary fibrosis) (HCC)     Past Surgical History:  Procedure Laterality Date   LEFT HEART CATH AND CORONARY ANGIOGRAPHY N/A 11/27/2021   Procedure: LEFT HEART CATH AND CORONARY ANGIOGRAPHY and possible pci and stent;  Surgeon: Florencio Cara BIRCH, MD;  Location: [facility] INVASIVE CV LAB;  Service: Cardiovascular;  Laterality: N/A;   RIGHT HEART CATH Right 08/10/2023   Procedure: RIGHT HEART CATH;  Surgeon: Florencio Cara BIRCH, MD;  Location: [facility] INVASIVE CV LAB;  Service: Cardiovascular;  Laterality: Right;    Medications Prior to Admission  Medication Sig Dispense Refill Last Dose/Taking   albuterol  (PROVENTIL ) (2.5 MG/3ML) 0.083% nebulizer solution Take 2.5 mg by nebulization in the morning and at bedtime.   11/09/2023   aspirin  81 MG chewable tablet Chew 81 mg by mouth every evening.   11/08/2023   atorvastatin  (LIPITOR) 40 MG tablet Take 40 mg by mouth  daily.   11/08/2023   budesonide-formoterol (SYMBICORT) 160-4.5 MCG/ACT inhaler Inhale 2 puffs into the lungs 2 (two) times daily.   11/09/2023   cholecalciferol (VITAMIN D3) 25 MCG (1000 UNIT) tablet Take 1,000 Units by mouth daily.   11/09/2023   cloNIDine  (CATAPRES  - DOSED IN MG/24 HR) 0.1 mg/24hr patch Place 1 patch onto the skin See admin instructions. Place 1 patch onto the skin every 5 days.   11/08/2023   esomeprazole (NEXIUM) 40 MG capsule Take 40 mg by mouth at bedtime.   11/08/2023   losartan  (COZAAR ) 50 MG tablet Take 25 mg by mouth in the morning and at bedtime.   11/09/2023   nitroGLYCERIN  (NITROSTAT ) 0.4 MG SL tablet Place 1 tablet (0.4 mg total) under the tongue every 5 (five) minutes as needed for chest pain. 20 tablet 12 Taking As Needed   Pirfenidone  267 MG TABS Take 534 mg by mouth with breakfast, with lunch, and with evening meal.   11/09/2023   TYVASO  DPI MAINTENANCE KIT 16 MCG POWD Inhale 16 mcg into the lungs in the morning, at noon, in the evening, and at bedtime.   11/09/2023   verapamil  (CALAN -SR) 180 MG CR tablet Take 180 mg by mouth at bedtime.   11/08/2023   acetylcysteine  (MUCOMYST ) 20 % nebulizer solution Take 4 mLs by nebulization 3 (three) times daily.      azathioprine (IMURAN) 100 MG tablet Take 100 mg by mouth daily.      OXYGEN Inhale 6 L into the lungs continuous.      Social History  Socioeconomic History   Marital status: Married    Spouse name: Not on file   Number of children: Not on file   Years of education: Not on file   Highest education level: Not on file  Occupational History   Not on file  Tobacco Use   Smoking status: Former   Smokeless tobacco: Never   Tobacco comments:    Quit 30 years ago  Substance and Sexual Activity   Alcohol use: Never   Drug use: Never   Sexual activity: Not Currently  Other Topics Concern   Not on file  Social History Narrative   ** Merged History Encounter **       Social Drivers of Health    Financial Resource Strain: Low Risk  (03/30/2023)   Received from [facility]   Overall Financial Resource Strain (CARDIA)    Difficulty of Paying Living Expenses: Not hard at all  Food Insecurity: No Food Insecurity (11/09/2023)   Hunger Vital Sign    Worried About Running Out of Food in the Last Year: Never true    Ran Out of Food in the Last Year: Never true  Transportation Needs: No Transportation Needs (11/09/2023)   PRAPARE - Administrator, Civil Service (Medical): No    Lack of Transportation (Non-Medical): No  Physical Activity: Not on file  Stress: Not on file  Social Connections: Socially Integrated (11/09/2023)   Social Connection and Isolation Panel    Frequency of Communication with Friends and Family: More than three times a week    Frequency of Social Gatherings with Friends and Family: More than three times a week    Attends Religious Services: More than 4 times per year    Active Member of Clubs or Organizations: Yes    Attends Banker Meetings: More than 4 times per year    Marital Status: Married  Catering manager Violence: Not At Risk (11/09/2023)   Humiliation, Afraid, Rape, and Kick questionnaire    Fear of Current or Ex-Partner: No    Emotionally Abused: No    Physically Abused: No    Sexually Abused: No    Family History  Problem Relation Age of Onset   Stroke Mother    Coronary artery disease Mother    Coronary artery disease Father    Stroke Father    Diabetes Father      Vitals:   11/14/23 0745 11/14/23 0751 11/14/23 0754 11/14/23 0800  BP:    (!) 108/53  Pulse: 96 95 89 86  Resp: (!) 27 (!) 26 (!) 28 (!) 29  Temp:      TempSrc:      SpO2: (!) 84% (!) 86% 93% 97%  Weight:      Height:        PHYSICAL EXAM General: Chronically ill-appearing elderly female, well nourished, in no acute distress. HEENT: Normocephalic and atraumatic. Neck: No JVD.  Lungs: Normal respiratory effort on HFNC 12 L.   Heart: HRRR. Normal S1 and S2 without gallops or murmurs.  Abdomen: Non-distended appearing.  Msk: Normal strength and tone for age. Extremities: Warm and well perfused. No clubbing, cyanosis.  Trace edema.  Neuro: Alert and oriented X 3. Psych: Answers questions appropriately.   Labs: Basic Metabolic Panel: Recent Labs    11/13/23 0420 11/14/23 0434  NA 137 135  K 2.9* 4.0  CL 93* 92*  CO2 28 28  GLUCOSE 103* 115*  BUN 26* 25*  CREATININE 0.87 0.80  CALCIUM  8.2* 8.3*  MG 1.7 2.1  PHOS 2.5 2.2*   Liver Function Tests: No results for input(s): AST, ALT, ALKPHOS, BILITOT, PROT, ALBUMIN in the last 72 hours.  No results for input(s): LIPASE, AMYLASE in the last 72 hours. CBC: Recent Labs    11/13/23 0420 11/14/23 0434  WBC 8.3 9.3  HGB 13.4 12.9  HCT 40.7 38.2  MCV 91.7 89.9  PLT 311 303   Cardiac Enzymes: Recent Labs    11/13/23 1044 11/13/23 1148 11/13/23 1436  TROPONINIHS 28* 24* 21*   BNP: No results for input(s): BNP in the last 72 hours.  D-Dimer: No results for input(s): DDIMER in the last 72 hours.  Hemoglobin A1C: No results for input(s): HGBA1C in the last 72 hours. Fasting Lipid Panel: No results for input(s): CHOL, HDL, LDLCALC, TRIG, CHOLHDL, LDLDIRECT in the last 72 hours. Thyroid Function Tests: No results for input(s): TSH, T4TOTAL, T3FREE, THYROIDAB in the last 72 hours.  Invalid input(s): FREET3 Anemia Panel: No results for input(s): VITAMINB12, FOLATE, FERRITIN, TIBC, IRON, RETICCTPCT in the last 72 hours.    Radiology: [facility] Chest Port 1 View Result Date: 11/12/2023 CLINICAL DATA:  Fever. EXAM: PORTABLE CHEST 1 VIEW COMPARISON:  11/11/2023. FINDINGS: The heart size and mediastinal contours are stable. There is atherosclerotic calcification aorta. Mild airspace disease is noted at the lung bases bilaterally. No effusion or pneumothorax is seen. Surgical changes are noted at the  shoulders bilaterally. No acute osseous abnormality. IMPRESSION: Stable mild airspace disease at the lung bases, possibly related to known pulmonary fibrosis and superimposed edema. Electronically Signed   By: Leita Birmingham M.D.   On: 11/12/2023 17:57   CT Angio Chest Pulmonary Embolism (PE) W or WO Contrast Result Date: 11/11/2023 CLINICAL DATA:  IPF, paroxysmal atrial tachycardia, short of breath, hypoxia EXAM: CT ANGIOGRAPHY CHEST WITH CONTRAST TECHNIQUE: Multidetector CT imaging of the chest was performed using the standard protocol during bolus administration of intravenous contrast. Multiplanar CT image reconstructions and MIPs were obtained to evaluate the vascular anatomy. RADIATION DOSE REDUCTION: This exam was performed according to the departmental dose-optimization program which includes automated exposure control, adjustment of the mA and/or kV according to patient size and/or use of iterative reconstruction technique. CONTRAST:  70mL OMNIPAQUE  IOHEXOL  350 MG/ML SOLN COMPARISON:  10/12/2023, 11/09/2023 FINDINGS: Cardiovascular: This is a technically adequate evaluation of the pulmonary vasculature. No filling defects or pulmonary emboli. Marked dilation of the main pulmonary arteries consistent with pulmonary arterial hypertension, stable. Stable cardiomegaly without pericardial effusion. No evidence of thoracic aortic aneurysm or dissection. Atherosclerosis of the aorta and coronary vasculature. Mediastinum/Nodes: No enlarged mediastinal, hilar, or axillary lymph nodes. Thyroid gland, trachea, and esophagus demonstrate no significant findings. Lungs/Pleura: Stable background emphysema. There is persistent background scarring and fibrosis, slightly more pronounced at the bases, compatible with given history of IPF. Since the recent exam, there is superimposed development of interlobular septal thickening and scattered ground-glass opacities favoring developing pulmonary edema. No effusion or  pneumothorax. Hypoventilatory changes at the lung bases. Central airways are patent. Upper Abdomen: No acute abnormality. Musculoskeletal: No acute or destructive bony abnormalities. Reconstructed images demonstrate no additional findings. Review of the MIP images confirms the above findings. IMPRESSION: 1. No evidence of pulmonary embolus. 2. Continued dilation of the main pulmonary arteries consistent with pulmonary arterial hypertension. 3. Continued background findings compatible with known IPF, with interval development of interlobular septal thickening and scattered ground-glass opacities compatible with superimposed edema. 4. Stable cardiomegaly. 5. Aortic Atherosclerosis (ICD10-I70.0) and  Emphysema (ICD10-J43.9). Electronically Signed   By: Ozell Daring M.D.   On: 11/11/2023 16:35   ECHOCARDIOGRAM COMPLETE BUBBLE STUDY Result Date: 11/10/2023    ECHOCARDIOGRAM REPORT   Patient Name:   Susan Sosa Date of Exam: 11/10/2023 Medical Rec #:  [id number]    Height:       59.5 in Accession #:    [id number]   Weight:       130.1 lb Date of Birth:  [birth year]     BSA:          1.545 m Patient Age:    83 years     BP:           111/62 mmHg Patient Gender: F            HR:           90 bpm. Exam Location:  [facility] Procedure: 2D Echo, Cardiac Doppler, Color Doppler and Saline Contrast Bubble            Study (Both Spectral and Color Flow Doppler were utilized during            procedure). Indications:     CHF-Congestive heart failure, Class I, acute on chronic,                  diastolic.  History:         Patient has prior history of Echocardiogram examinations, most                  recent 10/17/2023. Signs/Symptoms:Murmur; Risk                  Factors:Hypertension.  Sonographer:     Christopher Furnace Referring Phys:  [id number] HALINA PICKING Diagnosing Phys: Dwayne D Callwood MD IMPRESSIONS  1. Left ventricular ejection fraction, by estimation, is 70 to 75%. The left ventricle has hyperdynamic function. The left ventricle  has no regional wall motion abnormalities. Left ventricular diastolic parameters are consistent with Grade I diastolic dysfunction (impaired relaxation).  2. Right ventricular systolic function is moderately reduced. The right ventricular size is moderately enlarged. Mildly increased right ventricular wall thickness.  3. The mitral valve is grossly normal. Trivial mitral valve regurgitation.  4. The aortic valve is calcified. Aortic valve regurgitation is not visualized. Aortic valve sclerosis/calcification is present, without any evidence of aortic stenosis. FINDINGS  Left Ventricle: Left ventricular ejection fraction, by estimation, is 70 to 75%. The left ventricle has hyperdynamic function. The left ventricle has no regional wall motion abnormalities. Strain was performed and the global longitudinal strain is indeterminate. The left ventricular internal cavity size was normal in size. There is no left ventricular hypertrophy. Left ventricular diastolic parameters are consistent with Grade I diastolic dysfunction (impaired relaxation). Right Ventricle: The right ventricular size is moderately enlarged. Mildly increased right ventricular wall thickness. Right ventricular systolic function is moderately reduced. Left Atrium: Left atrial size was normal in size. Right Atrium: Right atrial size was normal in size. Pericardium: There is no evidence of pericardial effusion. Mitral Valve: The mitral valve is grossly normal. There is mild calcification of the mitral valve leaflet(s). Trivial mitral valve regurgitation. Tricuspid Valve: The tricuspid valve is normal in structure. Tricuspid valve regurgitation is not demonstrated. Aortic Valve: The aortic valve is calcified. Aortic valve regurgitation is not visualized. Aortic valve sclerosis/calcification is present, without any evidence of aortic stenosis. Aortic valve mean gradient measures 3.5 mmHg. Aortic valve peak gradient measures 5.9 mmHg. Aortic valve area, by VTI  measures 2.10 cm. Pulmonic Valve: The pulmonic valve was normal in structure. Pulmonic valve regurgitation is not visualized. Aorta: The ascending aorta was not well visualized. IAS/Shunts: No atrial level shunt detected by color flow Doppler. Agitated saline contrast was given intravenously to evaluate for intracardiac shunting. Additional Comments: 3D was performed not requiring image post processing on an independent workstation and was indeterminate.  LEFT VENTRICLE PLAX 2D LVIDd:         3.30 cm   Diastology LVIDs:         1.90 cm   LV e' medial:    5.11 cm/s LV PW:         0.90 cm   LV E/e' medial:  16.0 LV IVS:        1.10 cm   LV e' lateral:   5.22 cm/s LVOT diam:     2.00 cm   LV E/e' lateral: 15.7 LV SV:         45 LV SV Index:   29 LVOT Area:     3.14 cm  RIGHT VENTRICLE RV Basal diam:  4.10 cm RV Mid diam:    3.70 cm RV S prime:     15.00 cm/s TAPSE (M-mode): 2.5 cm LEFT ATRIUM           Index        RIGHT ATRIUM           Index LA diam:      3.30 cm 2.14 cm/m   RA Area:     10.40 cm LA Vol (A4C): 26.3 ml 17.02 ml/m  RA Volume:   22.30 ml  14.43 ml/m  AORTIC VALVE AV Area (Vmax):    1.99 cm AV Area (Vmean):   2.09 cm AV Area (VTI):     2.10 cm AV Vmax:           121.00 cm/s AV Vmean:          84.700 cm/s AV VTI:            0.214 m AV Peak Grad:      5.9 mmHg AV Mean Grad:      3.5 mmHg LVOT Vmax:         76.60 cm/s LVOT Vmean:        56.400 cm/s LVOT VTI:          0.143 m LVOT/AV VTI ratio: 0.67  AORTA Ao Root diam: 2.50 cm MITRAL VALVE                TRICUSPID VALVE MV Area (PHT): 6.96 cm     TR Peak grad:   37.5 mmHg MV Decel Time: 109 msec     TR Vmax:        306.00 cm/s MV E velocity: 81.80 cm/s MV A velocity: 120.00 cm/s  SHUNTS MV E/A ratio:  0.68         Systemic VTI:  0.14 m                             Systemic Diam: 2.00 cm Cara JONETTA Lovelace MD Electronically signed by Cara JONETTA Lovelace MD Signature Date/Time: 11/10/2023/5:31:35 PM    Final    DG Chest Port 1 View Result Date:  11/09/2023 EXAM: 1 VIEW XRAY OF THE CHEST 11/09/2023 02:13:46 PM COMPARISON: 10/14/2023. CLINICAL HISTORY: SOB, respiratory distress. FINDINGS: LUNGS AND PLEURA: Low lung volumes. Diffuse interstitial prominence, with basilar predominance, unchanged. Peripheral scarring  in the right upper lobe unchanged. Emphysema noted. No pleural effusion. No pneumothorax. HEART AND MEDIASTINUM: Atherosclerotic descending thoracic aorta. Mild cardiomegaly. BONES AND SOFT TISSUES: No acute osseous abnormality. IMPRESSION: 1. Diffuse interstitial prominence with basilar predominance, unchanged. 2. Emphysema. 3. Mild cardiomegaly. 4. Peripheral scarring in the right upper lobe, unchanged. 5. Low lung volumes. 6. Atherosclerotic descending thoracic aorta. Electronically signed by: Ryan Salvage MD 11/09/2023 02:37 PM EDT RP Workstation: [id number]   ECHOCARDIOGRAM COMPLETE Result Date: 10/17/2023    ECHOCARDIOGRAM REPORT   Patient Name:   Susan Sosa Date of Exam: 10/17/2023 Medical Rec #:  [id number]    Height:       59.0 in Accession #:    [id number]   Weight:       132.0 lb Date of Birth:  1940/03/07     BSA:          1.546 m Patient Age:    83 years     BP:           140/76 mmHg Patient Gender: F            HR:           60 bpm. Exam Location:  [facility] Procedure: 2D Echo, Cardiac Doppler and Color Doppler (Both Spectral and Color            Flow Doppler were utilized during procedure). Indications:     Cardiomyopathy-Ischemic I25.5                  Congestive Heart Failure I50.9  History:         Patient has prior history of Echocardiogram examinations, most                  recent 11/10/2022.  Sonographer:     Ashley McNeely-Sloane Referring Phys:  [id number] FUAD ALESKEROV Diagnosing Phys: Keller Paterson IMPRESSIONS  1. Left ventricular ejection fraction, by estimation, is 55 to 60%. The left ventricle has normal function. The left ventricle has no regional wall motion abnormalities. There is mild left ventricular hypertrophy.  Left ventricular diastolic parameters are consistent with Grade I diastolic dysfunction (impaired relaxation).  2. Right ventricular systolic function is mildly reduced. The right ventricular size is mildly enlarged.  3. The mitral valve is normal in structure. Trivial mitral valve regurgitation.  4. The aortic valve is tricuspid. Aortic valve regurgitation is not visualized.  5. The inferior vena cava is normal in size with greater than 50% respiratory variability, suggesting right atrial pressure of 3 mmHg. FINDINGS  Left Ventricle: Left ventricular ejection fraction, by estimation, is 55 to 60%. The left ventricle has normal function. The left ventricle has no regional wall motion abnormalities. The left ventricular internal cavity size was normal in size. There is  mild left ventricular hypertrophy. Left ventricular diastolic parameters are consistent with Grade I diastolic dysfunction (impaired relaxation). Right Ventricle: The right ventricular size is mildly enlarged. No increase in right ventricular wall thickness. Right ventricular systolic function is mildly reduced. Left Atrium: Left atrial size was normal in size. Right Atrium: Right atrial size was normal in size. Pericardium: There is no evidence of pericardial effusion. Mitral Valve: The mitral valve is normal in structure. Mild mitral annular calcification. Trivial mitral valve regurgitation. MV peak gradient, 5.7 mmHg. The mean mitral valve gradient is 2.0 mmHg. Tricuspid Valve: The tricuspid valve is normal in structure. Tricuspid valve regurgitation is trivial. Aortic Valve: The aortic valve is tricuspid. Aortic valve regurgitation is not visualized. Aortic  valve mean gradient measures 2.0 mmHg. Aortic valve peak gradient measures 3.3 mmHg. Aortic valve area, by VTI measures 1.45 cm. Pulmonic Valve: The pulmonic valve was not well visualized. Pulmonic valve regurgitation is not visualized. Aorta: The aortic root and ascending aorta are  structurally normal, with no evidence of dilitation. Venous: The inferior vena cava is normal in size with greater than 50% respiratory variability, suggesting right atrial pressure of 3 mmHg. IAS/Shunts: The atrial septum is grossly normal.  LEFT VENTRICLE PLAX 2D LVIDd:         3.50 cm     Diastology LVIDs:         1.90 cm     LV e' medial:    4.79 cm/s LV PW:         1.10 cm     LV E/e' medial:  10.7 LV IVS:        1.00 cm     LV e' lateral:   7.07 cm/s LVOT diam:     1.60 cm     LV E/e' lateral: 7.3 LV SV:         29 LV SV Index:   19 LVOT Area:     2.01 cm  LV Volumes (MOD) LV vol d, MOD A2C: 38.5 ml LV vol d, MOD A4C: 57.6 ml LV vol s, MOD A2C: 15.0 ml LV vol s, MOD A4C: 25.9 ml LV SV MOD A2C:     23.5 ml LV SV MOD A4C:     57.6 ml LV SV MOD BP:      28.7 ml RIGHT VENTRICLE RV Basal diam:  3.30 cm RV Mid diam:    3.85 cm RV S prime:     11.10 cm/s TAPSE (M-mode): 1.6 cm LEFT ATRIUM             Index        RIGHT ATRIUM          Index LA diam:        3.60 cm 2.33 cm/m   RA Area:     9.81 cm LA Vol (A2C):   25.6 ml 16.56 ml/m  RA Volume:   17.50 ml 11.32 ml/m LA Vol (A4C):   30.6 ml 19.80 ml/m LA Biplane Vol: 29.3 ml 18.95 ml/m  AORTIC VALVE                    PULMONIC VALVE AV Area (Vmax):    1.48 cm     PV Vmax:        1.37 m/s AV Area (Vmean):   1.45 cm     PV Vmean:       89.800 cm/s AV Area (VTI):     1.45 cm     PV VTI:         0.274 m AV Vmax:           90.80 cm/s   PV Peak grad:   7.5 mmHg AV Vmean:          63.350 cm/s  PV Mean grad:   4.0 mmHg AV VTI:            0.200 m      RVOT Peak grad: 6 mmHg AV Peak Grad:      3.3 mmHg AV Mean Grad:      2.0 mmHg LVOT Vmax:         66.80 cm/s LVOT Vmean:        45.550 cm/s LVOT VTI:  0.144 m LVOT/AV VTI ratio: 0.72  AORTA Ao Root diam: 3.00 cm Ao Asc diam:  2.40 cm MITRAL VALVE MV Area (PHT): 3.42 cm     SHUNTS MV Area VTI:   1.43 cm     Systemic VTI:  0.14 m MV Peak grad:  5.7 mmHg     Systemic Diam: 1.60 cm MV Mean grad:  2.0 mmHg      Pulmonic VTI:  0.244 m MV Vmax:       1.19 m/s MV Vmean:      57.8 cm/s MV Decel Time: 222 msec MV E velocity: 51.40 cm/s MV A velocity: 104.00 cm/s MV E/A ratio:  0.49 Keller Alluri Electronically signed by Keller Paterson Signature Date/Time: 10/17/2023/12:38:03 PM    Final     ECHO as above  TELEMETRY (personally reviewed): Sinus rhythm rate 90s  EKG (personally reviewed): NSR rate 90 bpm, t wave inversions similar to prior  Data reviewed by me 11/14/2023: last 24h vitals tele labs imaging I/O ED provider note, admission H&P, hospitalist progress note, pulmonology notes  Principal Problem:   Acute on chronic respiratory failure (HCC) Active Problems:   Benign essential hypertension   Dyspnea   Hypercholesterolemia   Idiopathic pulmonary fibrosis (HCC)   Sinus tachycardia   Elevated troponin   AKI (acute kidney injury)    ASSESSMENT AND PLAN:  Susan Sosa is a 83 y.o. female  with a past medical history of NICM, idiopathic pulmonary fibrosis, paroxysmal atrial tachycardia, hypertension, hyperlipidemia who presented to the ED on 11/09/2023 for SOB, hypoxia. Treated for acute on chronic hypoxic respiratory failure. 11/11/2023 was complaining of chest pain, cardiology was consulted for further evaluation.   # Atypical chest pain # Idiopathic pulmonary fibrosis # Non-ischemic cardiomyopathy # Paroxysmal atrial tachycardia # Hypertension # Hyperlipidemia Patient with episodes of atypical chest discomfort, described as intermittent 2 to 3 minutes of mild sharp pain that resolves on its own.  Episodes occur at rest.  Troponins mildly elevated initially on admission, repeats today have been stable at 39 and 39.  LHC 11/2021 with no significant obstructive coronary disease. CTA without evidence of PE. -Agree with IV Lasix , dosing is being managed by pulmonology.  -Continue home verapamil  180 mg daily. Consider addition of MRA or SGLT2i. -Continue aspirin  81 mg daily. -Low suspicion for  cardiac cause of chest discomfort.  No plan for further cardiac diagnostics at this time. -Mild and flat troponins most consistent with demand/supply mismatch and not ACS.  Cardiology will sign off. Please haiku with questions or re-engage if needed. Follow up with Tinnie Maiden, NP in 1-2 weeks.   This patient's plan of care was discussed and created with Dr. Paterson and he is in agreement.  Signed: Danita Bloch, PA-C  11/14/2023, 8:57 AM [facility]

## 2023-11-14 NOTE — Plan of Care (Signed)

## 2023-11-15 DIAGNOSIS — J9621 Acute and chronic respiratory failure with hypoxia: Secondary | ICD-10-CM | POA: Diagnosis not present

## 2023-11-15 DIAGNOSIS — J9622 Acute and chronic respiratory failure with hypercapnia: Secondary | ICD-10-CM | POA: Diagnosis not present

## 2023-11-15 LAB — MAGNESIUM: Magnesium: 1.8 mg/dL (ref 1.7–2.4)

## 2023-11-15 LAB — BASIC METABOLIC PANEL WITH GFR
Anion gap: 16 — ABNORMAL HIGH (ref 5–15)
BUN: 25 mg/dL — ABNORMAL HIGH (ref 8–23)
CO2: 26 mmol/L (ref 22–32)
Calcium: 8.3 mg/dL — ABNORMAL LOW (ref 8.9–10.3)
Chloride: 91 mmol/L — ABNORMAL LOW (ref 98–111)
Creatinine, Ser: 0.88 mg/dL (ref 0.44–1.00)
GFR, Estimated: >60 mL/min
Glucose, Bld: 103 mg/dL — ABNORMAL HIGH (ref 70–99)
Potassium: 3.3 mmol/L — ABNORMAL LOW (ref 3.5–5.1)
Sodium: 133 mmol/L — ABNORMAL LOW (ref 135–145)

## 2023-11-15 LAB — CBC
HCT: 39.2 % (ref 36.0–46.0)
Hemoglobin: 13.2 g/dL (ref 12.0–15.0)
MCH: 29.9 pg (ref 26.0–34.0)
MCHC: 33.7 g/dL (ref 30.0–36.0)
MCV: 88.9 fL (ref 80.0–100.0)
Platelets: 327 K/uL (ref 150–400)
RBC: 4.41 MIL/uL (ref 3.87–5.11)
RDW: 14 % (ref 11.5–15.5)
WBC: 9 K/uL (ref 4.0–10.5)
nRBC: 0 % (ref 0.0–0.2)

## 2023-11-15 LAB — PHOSPHORUS: Phosphorus: 2.8 mg/dL (ref 2.5–4.6)

## 2023-11-15 LAB — C-REACTIVE PROTEIN: CRP: 9.4 mg/dL — ABNORMAL HIGH (ref <1.0)

## 2023-11-15 MED ORDER — MAGNESIUM SULFATE 2 GM/50ML IV SOLN
2.0000 g | Freq: Once | INTRAVENOUS | Status: AC
  Administered 2023-11-15: 2 g via INTRAVENOUS
  Filled 2023-11-15: qty 50

## 2023-11-15 MED ORDER — POTASSIUM CHLORIDE 10 MEQ/100ML IV SOLN
10.0000 meq | INTRAVENOUS | Status: DC
  Administered 2023-11-15: 10 meq via INTRAVENOUS
  Filled 2023-11-15 (×4): qty 100

## 2023-11-15 MED ORDER — POTASSIUM CHLORIDE 20 MEQ PO PACK
40.0000 meq | PACK | Freq: Once | ORAL | Status: AC
Start: 2023-11-15 — End: 2023-11-15
  Administered 2023-11-15: 40 meq via ORAL
  Filled 2023-11-15: qty 2

## 2023-11-15 MED ORDER — VERAPAMIL HCL ER 120 MG PO TBCR
120.0000 mg | EXTENDED_RELEASE_TABLET | Freq: Every day | ORAL | Status: DC
Start: 2023-11-15 — End: 2023-11-18
  Administered 2023-11-15 – 2023-11-17 (×3): 120 mg via ORAL
  Filled 2023-11-15 (×3): qty 1

## 2023-11-15 NOTE — Progress Notes (Signed)
 Took 7 day supply of Treprostinil  POWD 16 mcg down to pharmacy and handed it to pharmacist

## 2023-11-15 NOTE — Progress Notes (Signed)
 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 Recent addition of Imuran due to progressive decline in PFT.  She at times gets respiratory infections but generally bounces back after course of abx and steroid taper.  She has also been on steroids with decadron with partial improvement. 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  Her PFT have also declined over last five years but FEV1 this year has been spared >80% with severe decrement in DLCO as excpected with PH and ILD.  She has a previous history of smoking for appx 40years and does have emphysematous changes overlying her UIP pattern of fibrosis. She is here with acute exacerbation of IPF with acute on chronic hypoxemic respiratory failure. She was noted to have elevated BNP >1343, and did have TTE last month showing impaired diastology.  PCCM consultation placed for management of complex pulmonary patient with advanced IPF with chronic hypoxemia.   11/11/23- patient with improvement overnight now on 10L/min  .  CRP markedly elevated consistent with AEIPF, negative viral workup.  Will do CTPE today due to elevated ddimer and hypoxemia to rule out PE.   11/12/23- patient  still requires HFNC with episodes of desaturation.  She has family at bedside and friends visiting today.  We discussed her CT chest with no PE and no pneumonia but there is interstitial edema which she seems to be able to diurese well with current diuretics.   11/13/23- patient is on 12L/min bubbler HF, she is on tapering dose of prednisone , imuran home dose, tyvasso home dose and lasix  with an extra dose to day to expedite diuresis.  She feels overall improved. She has HF at home which can deliver 15L/min.    11/14/23- patient is down to 10L overnight but had episode of desaturation. We repositioned her and she improved.  She will continue to diurese and work with PT today for safe discharge once closer to baseline.   11/15/23- patient on CPAP overnight , she did have desaturation event on 8L/min. Patient with normal BP on diuresis on lasix  40 daily.  I have dcd midodrine because patient is on antihypertensive medications, I have dcd rocephin since we have no evidence of ongoing infection and she has already been on it 4 days.  Id like to reduce volume of infusions with goal net negative fluid balance.  She continues on immunomodulator , antifibrotic and pulmonary vasodilator therapy and nebulizer therapy.  PT/OT is seeing her.   PAST MEDICAL HISTORY   Past Medical History:  Diagnosis Date   Arthritis    Heart murmur    Hyperlipidemia  Hypertension    Hyponatremia    IPF (idiopathic pulmonary fibrosis) (HCC)      SURGICAL HISTORY   Past Surgical History:  Procedure Laterality Date   LEFT HEART CATH AND CORONARY ANGIOGRAPHY N/A 11/27/2021   Procedure: LEFT HEART CATH AND CORONARY ANGIOGRAPHY and possible pci and stent;  Surgeon: Florencio Cara BIRCH, MD;  Location: [facility] INVASIVE CV LAB;  Service: Cardiovascular;  Laterality: N/A;   RIGHT HEART CATH Right 08/10/2023   Procedure: RIGHT HEART CATH;  Surgeon: Florencio Cara BIRCH, MD;  Location: [facility] INVASIVE CV LAB;  Service: Cardiovascular;   Laterality: Right;     FAMILY HISTORY   Family History  Problem Relation Age of Onset   Stroke Mother    Coronary artery disease Mother    Coronary artery disease Father    Stroke Father    Diabetes Father      SOCIAL HISTORY   Social History   Tobacco Use   Smoking status: Former   Smokeless tobacco: Never   Tobacco comments:    Quit 30 years ago  Substance Use Topics   Alcohol use: Never   Drug use: Never     MEDICATIONS    Home Medication:    Current Medication:  Current Facility-Administered Medications:    alum & mag hydroxide-simeth (MAALOX/MYLANTA) 200-200-20 MG/5ML suspension 30 mL, 30 mL, Oral, Q4H PRN, Von Bellis, MD, 30 mL at 11/13/23 1209   arformoterol (BROVANA) nebulizer solution 15 mcg, 15 mcg, Nebulization, BID, 15 mcg at 11/14/23 1931 **AND** [DISCONTINUED] budesonide (PULMICORT) nebulizer solution 0.25 mg, 0.25 mg, Nebulization, BID, Von Bellis, MD, 0.25 mg at 11/10/23 9176   aspirin  chewable tablet 81 mg, 81 mg, Oral, QPM, Cox, Amy N, DO, 81 mg at 11/14/23 1728   azaTHIOprine (IMURAN) tablet 50 mg, 50 mg, Oral, BID, Maxwel Meadowcroft, MD, 50 mg at 11/14/23 2158   azithromycin (ZITHROMAX) tablet 500 mg, 500 mg, Oral, Daily, Park, Leonor BROCKS, [place]   cefTRIAXone (ROCEPHIN) 1 g in sodium chloride  0.9 % 100 mL IVPB, 1 g, Intravenous, Q24H, Von Bellis, MD, Stopped at 11/14/23 1043   Chlorhexidine Gluconate Cloth 2 % PADS 6 each, 6 each, Topical, Daily, Mansy, Jan A, MD, 6 each at 11/14/23 1157   chlorpheniramine-HYDROcodone (TUSSIONEX) 10-8 MG/5ML suspension 5 mL, 5 mL, Oral, Q12H PRN, Von Bellis, MD   enoxaparin  (LOVENOX ) injection 40 mg, 40 mg, Subcutaneous, QPM, Von Bellis, MD, 40 mg at 11/14/23 1729   furosemide  (LASIX ) injection 40 mg, 40 mg, Intravenous, Daily, Fed Ceci, MD, 40 mg at 11/14/23 0958   guaiFENesin (MUCINEX) 12 hr tablet 600 mg, 600 mg, Oral, BID, Von Bellis, MD, 600 mg at 11/14/23 2159   LORazepam (ATIVAN)  injection 0.5 mg, 0.5 mg, Intravenous, Q6H PRN, Cox, Amy N, DO, 0.5 mg at 11/12/23 0205   magnesium  sulfate IVPB 2 g 50 mL, 2 g, Intravenous, Once, Harvel Meskill, MD   midodrine (PROAMATINE) tablet 10 mg, 10 mg, Oral, TID WC PRN, Von Bellis, MD, 10 mg at 11/14/23 1608   morphine  (PF) 2 MG/ML injection 2 mg, 2 mg, Intravenous, Q4H PRN, Von Bellis, MD   nitroGLYCERIN  (NITROSTAT ) SL tablet 0.4 mg, 0.4 mg, Sublingual, Q5 min PRN, Cox, Amy N, DO   Oral care mouth rinse, 15 mL, Mouth Rinse, PRN, Djan, Drue DASEN, MD   pantoprazole  (PROTONIX ) EC tablet 40 mg, 40 mg, Oral, BID, Viviana Leonor BROCKS, [place], 40 mg at 11/14/23 2159   Pirfenidone  TABS 534 mg, 534 mg, Oral, TID with meals, Von,  Dileep, MD, 534 mg at 11/14/23 1728   potassium chloride 10 mEq in 100 mL IVPB, 10 mEq, Intravenous, Q1 Hr x 4, Mansy, Jan A, MD, Last Rate: 100 mL/hr at 11/15/23 0704, 10 mEq at 11/15/23 0704   [COMPLETED] methylPREDNISolone  sodium succinate (SOLU-MEDROL ) 40 mg/mL injection 40 mg, 40 mg, Intravenous, Q12H, 40 mg at 11/10/23 1959 **FOLLOWED BY** [COMPLETED] methylPREDNISolone  sodium succinate (SOLU-MEDROL ) 40 mg/mL injection 40 mg, 40 mg, Intravenous, Q24H, 40 mg at 11/11/23 0929 **FOLLOWED BY** [COMPLETED] predniSONE  (DELTASONE ) tablet 40 mg, 40 mg, Oral, Q breakfast, 40 mg at 11/13/23 0901 **FOLLOWED BY** predniSONE  (DELTASONE ) tablet 30 mg, 30 mg, Oral, Q breakfast, 30 mg at 11/14/23 0838 **FOLLOWED BY** [START ON 11/17/2023] predniSONE  (DELTASONE ) tablet 20 mg, 20 mg, Oral, Q breakfast **FOLLOWED BY** [START ON 11/20/2023] predniSONE  (DELTASONE ) tablet 10 mg, 10 mg, Oral, Q breakfast, Von Bellis, MD   senna-docusate (Senokot-S) tablet 1 tablet, 1 tablet, Oral, QHS PRN, Cox, Amy N, DO   Treprostinil  POWD 16 mcg, 16 mcg, Inhalation, QID, Nada Adriana BIRCH, RPH, 16 mcg at 11/14/23 2158   verapamil  (CALAN -SR) CR tablet 180 mg, 180 mg, Oral, QHS, Dail Rankin RAMAN, RPH, 180 mg at 11/14/23 2158    ALLERGIES   Latex,  Lidocaine, Atorvastatin , Losartan , Metronidazole, and Oxycodone      REVIEW OF SYSTEMS    Review of Systems:  Gen:  Denies  fever, sweats, chills weigh loss  HEENT: Denies blurred vision, double vision, ear pain, eye pain, hearing loss, nose bleeds, sore throat Cardiac:  No dizziness, chest pain or heaviness, chest tightness,edema Resp:   reports dyspnea chronically  Gi: Denies swallowing difficulty, stomach pain, nausea or vomiting, diarrhea, constipation, bowel incontinence Gu:  Denies bladder incontinence, burning urine Ext:   Denies Joint pain, stiffness or swelling Skin: Denies  skin rash, easy bruising or bleeding or hives Endoc:  Denies polyuria, polydipsia , polyphagia or weight change Psych:   Denies depression, insomnia or hallucinations   Other:  All other systems negative   VS: BP 122/61   Pulse 97   Temp 98.4 F (36.9 C) (Oral)   Resp (!) 48   Ht 4' 11.5 (1.511 m)   Wt 59 kg   SpO2 96%   BMI 25.83 kg/m      PHYSICAL EXAM    GENERAL:NAD, no fevers, chills, no weakness no fatigue HEAD: Normocephalic, atraumatic.  EYES: Pupils equal, round, reactive to light. Extraocular muscles intact. No scleral icterus.  MOUTH: Moist mucosal membrane. Dentition intact. No abscess noted.  EAR, NOSE, THROAT: Clear without exudates. No external lesions.  NECK: Supple. No thyromegaly. No nodules. No JVD.  PULMONARY: decreased breath sounds with mild rhonchi worse at bases bilaterally.  CARDIOVASCULAR: S1 and S2. Regular rate and rhythm. No murmurs, rubs, or gallops. No edema. Pedal pulses 2+ bilaterally.  GASTROINTESTINAL: Soft, nontender, nondistended. No masses. Positive bowel sounds. No hepatosplenomegaly.  MUSCULOSKELETAL: No swelling, clubbing, or edema. Range of motion full in all extremities.  NEUROLOGIC: Cranial nerves II through XII are intact. No gross focal neurological deficits. Sensation intact. Reflexes intact.  SKIN: No ulceration, lesions, rashes, or  cyanosis. Skin warm and dry. Turgor intact.  PSYCHIATRIC: Mood, affect within normal limits. The patient is awake, alert and oriented x 3. Insight, judgment intact.       IMAGING   @IMAGES @   ASSESSMENT/PLAN   Acute on chronic hypoxemic respiratory failure       Currently negative for viral LRTI due to negative RVP and SARS/FLU/RSV panel      -  inflammatory workup - are elevated due to IPF exacerbation      - repeat TTE with grade 1 diastolic and moderate RV dysfunction.  Despite this she is fluid overloaded and responds well to diuresis.  Her CT showed interstitial edema overlying chronic pulmonary fibrosis.     Idiopathic pulmonary fibrosis with acute exacerbation    - advanced and progressed over past 5 years    - will continue steroids at current dose with taper   Centrilobular emphysema -no signs of acute exacerbation of COPD  - viral workup and inflammatory biomarkers are negative   - duoneb prn is sufficient at this time   -dc rocephin   Pulmonary hypertension associated with pulmonary fibrosis    - will dc tyvasso DPI at home with poor tolerance will switch to [place] going forward  -pre and post capillary per RHC done this year   -PT/OT ordered  -DC midodrine  Bibasilar atelectasis   - recruitement maneuvers via chest physiotherapy utilizing metaneb with duoneb     - PT /OT  Physical deconditioning   Continue PT/OT , continue chest PT  BODE score >8 with poor long term prognosis   Goals of care     - patient is currently DNR      Thank you for allowing me to participate in the care of this patient.   Patient/Family are satisfied with care plan and all questions have been answered.    Provider disclosure: Patient with at least one acute or chronic illness or injury that poses a threat to life or bodily function and is being managed actively during this encounter.  All of the below services have been performed independently by signing provider:  review  of prior documentation from internal and or external health records.  Review of previous and current lab results.  Interview and comprehensive assessment during patient visit today. Review of current and previous chest radiographs/CT scans. Discussion of management and test interpretation with health care team and patient/family.   This document was prepared using Dragon voice recognition software and may include unintentional dictation errors.     Torria Fromer, M.D.  Division of Pulmonary & Critical Care Medicine

## 2023-11-15 NOTE — Progress Notes (Signed)
 Progress Note   Patient: Susan Sosa FMW:[id number] DOB: 1940-12-14 DOA: 11/09/2023     6 DOS: the patient was seen and examined on 11/15/2023     Brief hospital course:     [name] is an 83 year old female with history of pulmonary fibrosis with chronic hypoxemic respiratory failure, GERD,, hypertension, hyperlipidemia, who presented from [facility] clinic pulmonology clinic for chief concerns of acute on chronic respiratory failure requiring increased oxygen supplementation.   Vitals in the ED showed t 97.7, rr 29, hr 140, blood pressure 165/86, SpO2 98% on BiPAP.   Serum sodium is 134, potassium 4.1, chloride 93, bicarb 25, BUN of 20, serum creatinine 1.31, eGFR 40, nonfasting blood glucose 174, WBC 0.8, hemoglobin 12.9, platelets of 259.   Lactic acid is 1.9.  HS troponin is 120.   ED treatment: DuoNebs one-time treatment, Solu-Medrol  125 mg IV one-time dose.     Assessment and Plan:   # Acute on chronic respiratory failure  At home patient is on 6 L at baseline O2 sat was 50% on 8 L at [facility] clinic VBG showed mild hypercapnia S/p BiPAP and admitted to stepdown unit Continue supplemental O2 halogen and gradually wean down to her baseline Use Cpap prn as per Pulmo and avoid BiPAP to prevent pneumothorax due to history of underlying pulmonary fibrosis  Chest CT did not show any PE S/p IV Solu-Medrol  Now undergoing prednisone  tapering Started Brovana nebulizer twice daily Continue DuoNeb every 6 hourly prn Mucinex 600 mg p.o. twice daily, Tussionex as needed Increased PPI 40 mg BID for GI prophylaxis Continue immunomodulator , antifibrotic and pulmonary vasodilator therapy for pulmonary fibrosis Pulmonary following.  Discontinued antibiotics and continue Lasix .      # AKI (acute kidney injury) On CKD stage IIIa. BNP 1343 sCr 1.31>>0.96 Strict I's and O's BNP was elevated so Lasix  was started by pulmonary Monitor renal functions and urine output   # Elevated  troponin most likely secondary to demand ischemia due to hypoxemia Patient denied any chest pain, S/p heparin  IV infusion, discontinued  Troponin remained flat. Echo showed EF 70 to 75% Cardiology consulted and at this time chest pain thought to be noncardiac 10/19 again had chest pressure, slightly elevated troponin most likely demand ischemia due to hypoxemia   # Pneumonia Patient had fever on 10/18 WBC WNL possible due to immunocompromise status  10/19 procalcitonin 1.5, elevated S/p Ceftriaxone x 4 dose Continue azithromycin 500 p.o. daily x 3 doses 10/18 blood cultures NGTD   # Hypertension with sinus tachycardia due to hypoxia Continue to monitor on telemetry Use hydralazine  as needed Monitor BP and titrate medications accordingly Continue verapamil  180 mg p.o. nightly 10/20 midodrine 10 mg p.o. 3 times daily as needed order placed Monitor BP and titrate medication accordingly  # Hypophosphatemia, Phos repleted. # Hypokalemia, potassium repleted. Monitor electrolytes daily and replete as needed.  Body mass index is 25.83 kg/m.  Interventions:   Diet: Regular diet DVT Prophylaxis: Subcutaneous Lovenox     Advance goals of care discussion: Full code   Family Communication: family was present at bedside, at the time of interview.  The pt provided permission to discuss medical plan with the family. Opportunity was given to ask question and all questions were answered satisfactorily.    Disposition:  Pt is from home, admitted with respiratory failure, still has shortness of breath, which precludes a safe discharge. Discharge to home, when stable, may need few days to improve.   Subjective: Patient seen and examined at  bedside this morning Patient uses 6 L oxygen at baseline at home Currently she is on 8 L HFNC and CPAP as needed Shortness of breath gradually improving, does not feel any worsening of symptoms.  Denies any chest pain today.    Physical  Exam: General: NAD, lying comfortably Appear in no distress, affect appropriate Eyes: PERRLA ENT: Oral Mucosa Clear, moist  Neck: no JVD,  Cardiovascular: S1 and S2 Present, no Murmur,  Respiratory: Increased work of breathing, shortness of breath, mild crackles bilaterally, no significant wheezes  Abdomen: Bowel Sound present, Soft and no tenderness,  Skin: no rashes Extremities: no Pedal edema, no calf tenderness Neurologic: without any new focal findings Gait not checked due to patient safety concerns   Data Reviewed:    Latest Ref Rng & Units 11/15/2023    3:39 AM 11/14/2023    4:34 AM 11/13/2023    4:20 AM  CBC  WBC 4.0 - 10.5 K/uL 9.0  9.3  8.3   Hemoglobin 12.0 - 15.0 g/dL 86.7  87.0  86.5   Hematocrit 36.0 - 46.0 % 39.2  38.2  40.7   Platelets 150 - 400 K/uL 327  303  311        Latest Ref Rng & Units 11/15/2023    3:39 AM 11/14/2023    4:34 AM 11/13/2023    4:20 AM  BMP  Glucose 70 - 99 mg/dL 896  884  896   BUN 8 - 23 mg/dL 25  25  26    Creatinine 0.44 - 1.00 mg/dL 9.11  9.19  9.12   Sodium 135 - 145 mmol/L 133  135  137   Potassium 3.5 - 5.1 mmol/L 3.3  4.0  2.9   Chloride 98 - 111 mmol/L 91  92  93   CO2 22 - 32 mmol/L 26  28  28    Calcium  8.9 - 10.3 mg/dL 8.3  8.3  8.2      Vitals:   11/15/23 0900 11/15/23 1000 11/15/23 1100 11/15/23 1200  BP: (!) 98/47 96/79 (!) 99/56 108/63  Pulse: (!) 105 96 93 100  Resp: (!) 30 (!) 25 20 (!) 24  Temp:    98 F (36.7 C)  TempSrc:    Oral  SpO2: 94% 93% (!) 86% 93%  Weight:      Height:       Total time spent: 55 minutes  Author: Elvan Sor, MD 11/15/2023 2:24 PM  For on call review [email or web address].

## 2023-11-15 NOTE — Plan of Care (Signed)
  Problem: Education: Goal: Knowledge of General Education information will improve Description: Including pain rating scale, medication(s)/side effects and non-pharmacologic comfort measures Outcome: Progressing   Problem: Health Behavior/Discharge Planning: Goal: Ability to manage health-related needs will improve Outcome: Progressing   Problem: Clinical Measurements: Goal: Ability to maintain clinical measurements within normal limits will improve Outcome: Progressing Goal: Will remain free from infection Outcome: Progressing Goal: Cardiovascular complication will be avoided Outcome: Progressing   Problem: Nutrition: Goal: Adequate nutrition will be maintained Outcome: Progressing   Problem: Coping: Goal: Level of anxiety will decrease Outcome: Progressing   Problem: Elimination: Goal: Will not experience complications related to bowel motility Outcome: Progressing Goal: Will not experience complications related to urinary retention Outcome: Progressing

## 2023-11-15 NOTE — Progress Notes (Signed)
 Physical Therapy Treatment Patient Details Name: Susan Sosa MRN: [id number] DOB: [birth year] Today's Date: 11/15/2023   History of Present Illness Patient is a 83 year old female with acute on chronic respiratory failure requiring increased oxygen supplementation. PMH: pulmonary fibrosis, chronic respiratory failure, GERD, HTN, HLD.    PT Comments  Patient is eager to try walking. Patient ambulated 64ft with rolling walker on 10L 02. Sp02 down to 79%, however patient did not appear significantly dyspneic, was able to hold short conversation, and reports feeling great with walking. Sp02 back to 96% after walking with cues for breathing techniques. She is hopeful to return home with supportive spouse soon. She is making progress. PT will continue to follow.    If plan is discharge home, recommend the following: Assist for transportation;Help with stairs or ramp for entrance;Assistance with cooking/housework   Can travel by Doctor, hospital (measurements PT) (for community mobility (patient requesting))    Recommendations for Other Services       Precautions / Restrictions Precautions Precautions: Fall Recall of Precautions/Restrictions: Intact Restrictions Weight Bearing Restrictions Per Provider Order: No     Mobility  Bed Mobility               General bed mobility comments: not assessed as patient sitting up on arrival and post session    Transfers Overall transfer level: Needs assistance Equipment used: Rolling walker (2 wheels) Transfers: Sit to/from Stand Sit to Stand: Contact guard assist           General transfer comment: CGA for safety    Ambulation/Gait Ambulation/Gait assistance: Contact guard assist (chair follow for safety but not required) Gait Distance (Feet): 75 Feet Assistive device: Rolling walker (2 wheels) Gait Pattern/deviations: Step-through pattern Gait velocity: decreased     General Gait  Details: reinforcement for proper rolling walker placement, for breathing techniques, and to take rest breaks as needed based on symptoms. patient was not significantly dyspneic with walking. Sp02 down to 79% with walking on 10 L02. patient talking while walking and reports feeling great. Sp02 back up to 96% after short rest break with cues for breathing technqiues   Stairs             Wheelchair Mobility     Tilt Bed    Modified Rankin (Stroke Patients Only)       Balance Overall balance assessment: Needs assistance Sitting-balance support: Feet supported Sitting balance-Leahy Scale: Good     Standing balance support: Bilateral upper extremity supported Standing balance-Leahy Scale: Fair Standing balance comment: with rolling walker for support in standing                            Communication Communication Communication: No apparent difficulties  Cognition Arousal: Alert Behavior During Therapy: WFL for tasks assessed/performed   PT - Cognitive impairments: No apparent impairments                         Following commands: Intact      Cueing Cueing Techniques: Verbal cues  Exercises      General Comments General comments (skin integrity, edema, etc.): supportive spouse at the bedside. patient is hopeful to go home by Friday      Pertinent Vitals/Pain Pain Assessment Pain Assessment: No/denies pain    Home Living  Prior Function            PT Goals (current goals can now be found in the care plan section) Acute Rehab PT Goals Patient Stated Goal: to go home PT Goal Formulation: With patient Time For Goal Achievement: 11/27/23 Potential to Achieve Goals: Fair Progress towards PT goals: Progressing toward goals    Frequency    Min 2X/week      PT Plan      Co-evaluation              AM-PAC PT 6 Clicks Mobility   Outcome Measure  Help needed turning from your back to  your side while in a flat bed without using bedrails?: A Little Help needed moving from lying on your back to sitting on the side of a flat bed without using bedrails?: A Little Help needed moving to and from a bed to a chair (including a wheelchair)?: A Little Help needed standing up from a chair using your arms (e.g., wheelchair or bedside chair)?: A Little Help needed to walk in hospital room?: A Little Help needed climbing 3-5 steps with a railing? : A Little 6 Click Score: 18    End of Session Equipment Utilized During Treatment: Oxygen Activity Tolerance: Patient tolerated treatment well Patient left: in chair;with call bell/phone within reach;with family/visitor present Nurse Communication: Mobility status PT Visit Diagnosis: Muscle weakness (generalized) (M62.81);Unsteadiness on feet (R26.81)     Time: 8579-8567 PT Time Calculation (min) (ACUTE ONLY): 12 min  Charges:    $Therapeutic Activity: 8-22 mins PT General Charges $$ ACUTE PT VISIT: 1 Visit                     Randine Essex, PT, MPT    Randine LULLA Essex 11/15/2023, 2:55 PM

## 2023-11-15 NOTE — Plan of Care (Signed)

## 2023-11-15 NOTE — Progress Notes (Signed)
 Occupational Therapy Treatment Patient Details Name: Susan Sosa MRN: [id number] DOB: 1941-01-02 Today's Date: 11/15/2023   History of present illness Patient is a 83 year old female with acute on chronic respiratory failure requiring increased oxygen supplementation. PMH: pulmonary fibrosis, chronic respiratory failure, GERD, HTN, HLD.   OT comments  Chart reviewed to date, pt greeted in chair, agreeable to OT tx session targeting improving functional activity tolerance in prep for ADL tasks. Pt is making progress towards goals, performs STS with RW on 9L via [place] with spo2 >88% with static standing for approx  30 seconds with CGA. Grooming tasks completed with set up. This therapist assisted as a chair follow while pt amb 45ft with PT on 10 L via [place], spo2 to 79% but pt does not report SOB. 96% on 9L via [place] after approx 1 minute, PLB in chair. Pt is left in chair, all needs met. OT will continue to follow.       If plan is discharge home, recommend the following:  A little help with walking and/or transfers;A little help with bathing/dressing/bathroom;Assistance with cooking/housework;Direct supervision/assist for financial management;Assist for transportation;Help with stairs or ramp for entrance;Supervision due to cognitive status   Equipment Recommendations  BSC/3in1    Recommendations for Other Services      Precautions / Restrictions Precautions Precautions: Fall Recall of Precautions/Restrictions: Intact Restrictions Weight Bearing Restrictions Per Provider Order: No       Mobility Bed Mobility               General bed mobility comments: NT in recliner pre/post session    Transfers Overall transfer level: Needs assistance Equipment used: Rolling walker (2 wheels) Transfers: Sit to/from Stand Sit to Stand: Contact guard assist                 Balance Overall balance assessment: Needs assistance Sitting-balance support: Feet supported Sitting balance-Leahy  Scale: Good     Standing balance support: Bilateral upper extremity supported, Reliant on assistive device for balance, During functional activity Standing balance-Leahy Scale: Fair                             ADL either performed or assessed with clinical judgement   ADL Overall ADL's : Needs assistance/impaired     Grooming: Set up;Sitting;Wash/dry face;Oral care                   Toilet Transfer: Contact guard assist;Rolling walker (2 wheels);Ambulation Toilet Transfer Details (indicate cue type and reason): simulated  Static standing for approx 30 seconds with RW with CGA               Extremity/Trunk Assessment              Vision       Perception     Praxis     Communication Communication Communication: No apparent difficulties   Cognition Arousal: Alert Behavior During Therapy: WFL for tasks assessed/performed Cognition: No apparent impairments                               Following commands: Intact        Cueing      Exercises Other Exercises Other Exercises: edu re role of OT, role of rehab, discharge recommendations, energy conservation techniques for increased ease/safer ADLs    Shoulder Instructions       General Comments  supportive spouse at the bedside. patient is hopeful to go home by Friday    Pertinent Vitals/ Pain       Pain Assessment Pain Assessment: No/denies pain  Home Living                                          Prior Functioning/Environment              Frequency  Min 2X/week        Progress Toward Goals  OT Goals(current goals can now be found in the care plan section)  Progress towards OT goals: Progressing toward goals  Acute Rehab OT Goals Time For Goal Achievement: 11/28/23  Plan      Co-evaluation                 AM-PAC OT 6 Clicks Daily Activity     Outcome Measure   Help from another person eating meals?: None Help from  another person taking care of personal grooming?: None Help from another person toileting, which includes using toliet, bedpan, or urinal?: A Little Help from another person bathing (including washing, rinsing, drying)?: A Little Help from another person to put on and taking off regular upper body clothing?: A Little Help from another person to put on and taking off regular lower body clothing?: A Little 6 Click Score: 20    End of Session Equipment Utilized During Treatment: Oxygen  OT Visit Diagnosis: Unsteadiness on feet (R26.81);Repeated falls (R29.6);Muscle weakness (generalized) (M62.81)   Activity Tolerance Patient tolerated treatment well   Patient Left in chair;with call bell/phone within reach;with family/visitor present   Nurse Communication Mobility status        Time: 8593-8579 OT Time Calculation (min): 14 min  Charges: OT General Charges $OT Visit: 1 Visit OT Treatments $Self Care/Home Management : 8-22 mins  Therisa Sheffield, OTD OTR/L  11/15/23, 3:30 PM

## 2023-11-16 ENCOUNTER — Inpatient Hospital Stay

## 2023-11-16 DIAGNOSIS — J9621 Acute and chronic respiratory failure with hypoxia: Secondary | ICD-10-CM | POA: Diagnosis not present

## 2023-11-16 DIAGNOSIS — J9622 Acute and chronic respiratory failure with hypercapnia: Secondary | ICD-10-CM | POA: Diagnosis not present

## 2023-11-16 LAB — BASIC METABOLIC PANEL WITH GFR
Anion gap: 10 (ref 5–15)
BUN: 25 mg/dL — ABNORMAL HIGH (ref 8–23)
CO2: 31 mmol/L (ref 22–32)
Calcium: 8.3 mg/dL — ABNORMAL LOW (ref 8.9–10.3)
Chloride: 93 mmol/L — ABNORMAL LOW (ref 98–111)
Creatinine, Ser: 0.77 mg/dL (ref 0.44–1.00)
GFR, Estimated: >60 mL/min (ref >60)
Glucose, Bld: 116 mg/dL — ABNORMAL HIGH (ref 70–99)
Potassium: 3.9 mmol/L (ref 3.5–5.1)
Sodium: 134 mmol/L — ABNORMAL LOW (ref 135–145)

## 2023-11-16 LAB — CBC
HCT: 35.6 % — ABNORMAL LOW (ref 36.0–46.0)
Hemoglobin: 12 g/dL (ref 12.0–15.0)
MCH: 29.9 pg (ref 26.0–34.0)
MCHC: 33.7 g/dL (ref 30.0–36.0)
MCV: 88.6 fL (ref 80.0–100.0)
Platelets: 329 K/uL (ref 150–400)
RBC: 4.02 MIL/uL (ref 3.87–5.11)
RDW: 13.8 % (ref 11.5–15.5)
WBC: 8.7 K/uL (ref 4.0–10.5)
nRBC: 0 % (ref 0.0–0.2)

## 2023-11-16 LAB — PHOSPHORUS: Phosphorus: 3.1 mg/dL (ref 2.5–4.6)

## 2023-11-16 LAB — PROCALCITONIN: Procalcitonin: 0.44 ng/mL

## 2023-11-16 LAB — MAGNESIUM: Magnesium: 1.9 mg/dL (ref 1.7–2.4)

## 2023-11-16 MED ORDER — AZITHROMYCIN 250 MG PO TABS
250.0000 mg | ORAL_TABLET | Freq: Every day | ORAL | Status: DC
  Administered 2023-11-16: 250 mg via ORAL
  Filled 2023-11-16: qty 1

## 2023-11-16 MED ORDER — VITAMIN D (ERGOCALCIFEROL) 1.25 MG (50000 UNIT) PO CAPS
50000.0000 [IU] | ORAL_CAPSULE | ORAL | Status: DC
  Administered 2023-11-16: 50000 [IU] via ORAL
  Filled 2023-11-16: qty 1

## 2023-11-16 MED ORDER — MIDODRINE HCL 5 MG PO TABS
5.0000 mg | ORAL_TABLET | Freq: Three times a day (TID) | ORAL | Status: DC
  Administered 2023-11-16 (×2): 5 mg via ORAL
  Filled 2023-11-16 (×2): qty 1

## 2023-11-16 NOTE — Progress Notes (Signed)
 PULMONOLOGY         Date: 11/16/2023,   MRN# [id number] Susan Sosa Nov 03, 1940     AdmissionWeight: 59 kg                 CurrentWeight: 59 kg  Referring provider: Dr Susan Sosa   CHIEF COMPLAINT:   Acute on chronic hypoxemic respiratory failure   HISTORY OF PRESENT ILLNESS   This is an 83 yo F with hx of chronic lung disease with IPF and chronic hypoxemia, HTN,cardiac dysfunction with AF, GERD who has been declining over past 1-2 years despite aggressive medical therapy.  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 Recent addition of Imuran due to progressive decline in PFT.  She at times gets respiratory infections but generally bounces back after course of abx and steroid taper.  She has also been on steroids with decadron with partial improvement. 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  Her PFT have also declined over last five years but FEV1 this year has been spared >80% with severe decrement in DLCO as excpected with PH and ILD.  She has Sosa previous history of smoking for appx 40years and does have emphysematous changes overlying her UIP pattern of fibrosis. She is here with acute exacerbation of IPF with acute on chronic hypoxemic respiratory failure. She was noted to have elevated BNP >1343, and did have TTE last month showing impaired diastology.  PCCM consultation placed for management of complex pulmonary patient with advanced IPF with chronic hypoxemia.   11/11/23- patient with improvement overnight now on 10L/min  .  CRP markedly elevated consistent with AEIPF, negative viral workup.  Will do CTPE today due to elevated ddimer and hypoxemia to rule out PE.   11/12/23- patient  still requires HFNC with episodes of desaturation.  She has family at bedside and friends visiting today.  We discussed her CT chest with no PE and no pneumonia but there is interstitial edema which she seems to be able to diurese well with current diuretics.   11/13/23- patient is on 12L/min bubbler HF, she is on tapering dose of prednisone , imuran home dose, tyvasso home dose and lasix  with an extra dose to day to expedite diuresis.  She feels overall improved. She has HF at home which can deliver 15L/min.    11/14/23- patient is down to 10L overnight but had episode of desaturation. We repositioned her and she improved.  She will continue to diurese and work with PT today for safe discharge once closer to baseline.   11/15/23- patient on CPAP overnight , she did have desaturation event on 8L/min. Patient with normal BP on diuresis on lasix  40 daily.  I have dcd midodrine because patient is on antihypertensive medications, I have dcd rocephin since we have no evidence of ongoing infection and she has already been on it 4 days.  Id like to reduce volume of infusions with goal net negative fluid balance.  She continues on immunomodulator , antifibrotic and pulmonary vasodilator therapy and nebulizer therapy.  PT/OT is seeing her.   11/16/23- patient on 8L/min this am,  she walked around with PT/OT yesterday in hallway and had mild transient desaturation.  Electrolytes are better, CRP is  trending down.  Patient speaking in full sentences and is now on dc Planning phase of care.   PAST MEDICAL HISTORY   Past Medical History:  Diagnosis Date   Arthritis    Heart murmur    Hyperlipidemia    Hypertension    Hyponatremia    IPF (idiopathic pulmonary fibrosis) (HCC)      SURGICAL HISTORY   Past Surgical History:  Procedure Laterality Date   LEFT HEART CATH AND CORONARY ANGIOGRAPHY N/Sosa 11/27/2021   Procedure: LEFT HEART CATH AND CORONARY ANGIOGRAPHY and possible pci and stent;  Surgeon: Susan Cara BIRCH, MD;  Location: [facility] INVASIVE CV LAB;  Service: Cardiovascular;  Laterality: N/Sosa;   RIGHT HEART CATH Right 08/10/2023   Procedure: RIGHT HEART CATH;  Surgeon: Susan Cara BIRCH, MD;  Location: [facility] INVASIVE CV LAB;  Service: Cardiovascular;  Laterality: Right;     FAMILY HISTORY   Family History  Problem Relation Age of Onset   Stroke Mother    Coronary artery disease Mother    Coronary artery disease Father    Stroke Father    Diabetes Father      SOCIAL HISTORY   Social History   Tobacco Use   Smoking status: Former   Smokeless tobacco: Never   Tobacco comments:    Quit 30 years ago  Substance Use Topics   Alcohol use: Never   Drug use: Never     MEDICATIONS    Home Medication:    Current Medication:  Current Facility-Administered Medications:    alum & mag hydroxide-simeth (MAALOX/MYLANTA) 200-200-20 MG/5ML suspension 30 mL, 30 mL, Oral, Q4H PRN, Susan Bellis, MD, 30 mL at 11/13/23 1209   arformoterol (BROVANA) nebulizer solution 15 mcg, 15 mcg, Nebulization, BID, 15 mcg at 11/16/23 0741 **AND** [DISCONTINUED] budesonide (PULMICORT) nebulizer solution 0.25 mg, 0.25 mg, Nebulization, BID, Susan Bellis, MD, 0.25 mg at 11/10/23 9176   aspirin  chewable tablet 81 mg, 81 mg, Oral, QPM, Sosa, Susan N, DO, 81 mg at 11/15/23 1749   azaTHIOprine (IMURAN) tablet 50 mg, 50 mg, Oral, BID, Susan Bisig, MD, 50 mg at 11/15/23 2118   azithromycin (ZITHROMAX) tablet 500 mg, 500 mg, Oral, Daily, Susan Sosa, [place], 500 mg at 11/15/23 1015   Chlorhexidine Gluconate Cloth 2 % PADS 6 each, 6 each, Topical, Daily, Mansy, Susan A, MD, 6 each at 11/15/23 1000   chlorpheniramine-HYDROcodone (TUSSIONEX) 10-8 MG/5ML suspension 5 mL, 5 mL, Oral, Q12H PRN, Susan Bellis, MD   enoxaparin  (LOVENOX ) injection 40 mg, 40 mg, Subcutaneous, QPM, Susan Bellis, MD, 40 mg at 11/15/23 1749   furosemide  (LASIX ) injection 40 mg, 40 mg, Intravenous, Daily, Susan Lemonds, MD, 40 mg at  11/15/23 1016   guaiFENesin (MUCINEX) 12 hr tablet 600 mg, 600 mg, Oral, BID, Susan Bellis, MD, 600 mg at 11/15/23 2118   LORazepam (ATIVAN) injection 0.5 mg, 0.5 mg, Intravenous, Q6H PRN, Sosa, Susan N, DO, 0.5 mg at 11/12/23 0205   morphine  (PF) 2 MG/ML injection 2 mg, 2 mg, Intravenous, Q4H PRN, Susan Bellis, MD   nitroGLYCERIN  (NITROSTAT ) SL tablet 0.4 mg, 0.4 mg, Sublingual, Q5 min PRN, Sosa, Susan N, DO   Oral care mouth rinse, 15 mL, Mouth Rinse, PRN, Djan, Drue DASEN, MD   pantoprazole  (PROTONIX ) EC tablet 40 mg, 40 mg, Oral, BID, Viviana Leonor Sosa, [place], 40 mg at 11/15/23 2118   Pirfenidone  TABS 534 mg, 534 mg, Oral, TID with meals, Susan Bellis, MD, 534 mg at 11/15/23 1852   [COMPLETED] methylPREDNISolone  sodium  succinate (SOLU-MEDROL ) 40 mg/mL injection 40 mg, 40 mg, Intravenous, Q12H, 40 mg at 11/10/23 1959 **FOLLOWED BY** [COMPLETED] methylPREDNISolone  sodium succinate (SOLU-MEDROL ) 40 mg/mL injection 40 mg, 40 mg, Intravenous, Q24H, 40 mg at 11/11/23 0929 **FOLLOWED BY** [COMPLETED] predniSONE  (DELTASONE ) tablet 40 mg, 40 mg, Oral, Q breakfast, 40 mg at 11/13/23 0901 **FOLLOWED BY** predniSONE  (DELTASONE ) tablet 30 mg, 30 mg, Oral, Q breakfast, 30 mg at 11/15/23 0816 **FOLLOWED BY** [START ON 11/17/2023] predniSONE  (DELTASONE ) tablet 20 mg, 20 mg, Oral, Q breakfast **FOLLOWED BY** [START ON 11/20/2023] predniSONE  (DELTASONE ) tablet 10 mg, 10 mg, Oral, Q breakfast, Susan Bellis, MD   senna-docusate (Senokot-S) tablet 1 tablet, 1 tablet, Oral, QHS PRN, Sosa, Susan N, DO   Treprostinil  POWD 16 mcg, 16 mcg, Inhalation, QID, Nada Adriana Sosa, RPH, 16 mcg at 11/15/23 2116   verapamil  (CALAN -SR) CR tablet 120 mg, 120 mg, Oral, QHS, Bettyjean Stefanski, MD, 120 mg at 11/15/23 2118    ALLERGIES   Latex, Lidocaine, Atorvastatin , Losartan , Metronidazole, and Oxycodone      REVIEW OF SYSTEMS    Review of Systems:  Gen:  Denies  fever, sweats, chills weigh loss  HEENT: Denies blurred vision, double  vision, ear pain, eye pain, hearing loss, nose bleeds, sore throat Cardiac:  No dizziness, chest pain or heaviness, chest tightness,edema Resp:   reports dyspnea chronically  Gi: Denies swallowing difficulty, stomach pain, nausea or vomiting, diarrhea, constipation, bowel incontinence Gu:  Denies bladder incontinence, burning urine Ext:   Denies Joint pain, stiffness or swelling Skin: Denies  skin rash, easy bruising or bleeding or hives Endoc:  Denies polyuria, polydipsia , polyphagia or weight change Psych:   Denies depression, insomnia or hallucinations   Other:  All other systems negative   VS: BP (!) 104/51   Pulse 85   Temp 98 F (36.7 C) (Axillary)   Resp 17   Ht 4' 11.5 (1.511 m)   Wt 59 kg   SpO2 93%   BMI 25.83 kg/m      PHYSICAL EXAM    GENERAL:NAD, no fevers, chills, no weakness no fatigue HEAD: Normocephalic, atraumatic.  EYES: Pupils equal, round, reactive to light. Extraocular muscles intact. No scleral icterus.  MOUTH: Moist mucosal membrane. Dentition intact. No abscess noted.  EAR, NOSE, THROAT: Clear without exudates. No external lesions.  NECK: Supple. No thyromegaly. No nodules. No JVD.  PULMONARY: decreased breath sounds with mild rhonchi worse at bases bilaterally.  CARDIOVASCULAR: S1 and S2. Regular rate and rhythm. No murmurs, rubs, or gallops. No edema. Pedal pulses 2+ bilaterally.  GASTROINTESTINAL: Soft, nontender, nondistended. No masses. Positive bowel sounds. No hepatosplenomegaly.  MUSCULOSKELETAL: No swelling, clubbing, or edema. Range of motion full in all extremities.  NEUROLOGIC: Cranial nerves II through XII are intact. No gross focal neurological deficits. Sensation intact. Reflexes intact.  SKIN: No ulceration, lesions, rashes, or cyanosis. Skin warm and dry. Turgor intact.  PSYCHIATRIC: Mood, affect within normal limits. The patient is awake, alert and oriented x 3. Insight, judgment intact.       IMAGING   @IMAGES @    ASSESSMENT/PLAN   Acute on chronic hypoxemic respiratory failure       Currently negative for viral LRTI due to negative RVP and SARS/FLU/RSV panel      - inflammatory workup - are elevated due to IPF exacerbation      - repeat TTE with grade 1 diastolic and moderate RV dysfunction.  Despite this she is fluid overloaded and responds well to diuresis.  Her CT showed  interstitial edema overlying chronic pulmonary fibrosis.     Idiopathic pulmonary fibrosis with acute exacerbation    - advanced and progressed over past 5 years    - will continue steroids at current dose with taper   Centrilobular emphysema -no signs of acute exacerbation of COPD  - viral workup and inflammatory biomarkers are negative   - duoneb prn is sufficient at this time   -dc rocephin   Pulmonary hypertension associated with pulmonary fibrosis    - will dc tyvasso DPI at home with poor tolerance will switch to [place] going forward  -pre and post capillary per RHC done this year   -PT/OT ordered  -DC midodrine  Bibasilar atelectasis   - recruitement maneuvers via chest physiotherapy utilizing metaneb with duoneb     - PT /OT  Physical deconditioning   Continue PT/OT , continue chest PT  BODE score >8 with poor long term prognosis   Goals of care     - patient is currently DNR      Thank you for allowing me to participate in the care of this patient.   Patient/Family are satisfied with care plan and all questions have been answered.    Provider disclosure: Patient with at least one acute or chronic illness or injury that poses Sosa threat to life or bodily function and is being managed actively during this encounter.  All of the below services have been performed independently by signing provider:  review of prior documentation from internal and or external health records.  Review of previous and current lab results.  Interview and comprehensive assessment during patient visit today. Review of  current and previous chest radiographs/CT scans. Discussion of management and test interpretation with health care team and patient/family.   This document was prepared using Dragon voice recognition software and may include unintentional dictation errors.     Jahnae Mcadoo, M.D.  Division of Pulmonary & Critical Care Medicine

## 2023-11-16 NOTE — Progress Notes (Signed)
 Progress Note   Patient: Susan Sosa FMW:[id number] DOB: [birth year] DOA: 11/09/2023     7 DOS: the patient was seen and examined on 11/16/2023     Brief hospital course:     [name] is an 83 year old female with history of pulmonary fibrosis with chronic hypoxemic respiratory failure, GERD,, hypertension, hyperlipidemia, who presented from [facility] clinic pulmonology clinic for chief concerns of acute on chronic respiratory failure requiring increased oxygen supplementation.   Vitals in the ED showed t 97.7, rr 29, hr 140, blood pressure 165/86, SpO2 98% on BiPAP.   Serum sodium is 134, potassium 4.1, chloride 93, bicarb 25, BUN of 20, serum creatinine 1.31, eGFR 40, nonfasting blood glucose 174, WBC 0.8, hemoglobin 12.9, platelets of 259.   Lactic acid is 1.9.  HS troponin is 120.   ED treatment: DuoNebs one-time treatment, Solu-Medrol  125 mg IV one-time dose.     Assessment and Plan:   # Acute on chronic respiratory failure  At home patient is on 6 L at baseline O2 sat was 50% on 8 L at [facility] clinic VBG showed mild hypercapnia S/p BiPAP and admitted to stepdown unit Continue supplemental O2 halogen and gradually wean down to her baseline Use Cpap prn as per Pulmo and avoid BiPAP to prevent pneumothorax due to history of underlying pulmonary fibrosis  Chest CT did not show any PE S/p IV Solu-Medrol  Now undergoing prednisone  tapering Started Brovana nebulizer twice daily Continue DuoNeb every 6 hourly prn Mucinex 600 mg p.o. twice daily, Tussionex as needed Increased PPI 40 mg BID for GI prophylaxis Continue immunomodulator , antifibrotic and pulmonary vasodilator therapy for pulmonary fibrosis Pulmonary following.  Discontinued antibiotics and continue Lasix .      # AKI (acute kidney injury) On CKD stage IIIa. BNP 1343 sCr 1.31>>0.96 Strict I's and O's BNP was elevated so Lasix  was started by pulmonary Monitor renal functions and urine output   # Elevated  troponin most likely secondary to demand ischemia due to hypoxemia Patient denied any chest pain, S/p heparin  IV infusion, discontinued  Troponin remained flat. Echo showed EF 70 to 75% Cardiology consulted and at this time chest pain thought to be noncardiac 10/19 again had chest pressure, slightly elevated troponin most likely demand ischemia due to hypoxemia   # Pneumonia Patient had fever on 10/18 WBC WNL possible due to immunocompromise status  10/19 procalcitonin 1.5, elevated S/p Ceftriaxone x 4 dose Continue azithromycin 500 p.o. daily x 3 doses 10/18 blood cultures NGTD   # Hypertension with sinus tachycardia due to hypoxia Continue to monitor on telemetry Use hydralazine  as needed Monitor BP and titrate medications accordingly Continue verapamil  180 mg p.o. nightly 10/20 midodrine 10 mg p.o. 3 times daily as needed order placed 10/22 low blood pressure, started midodrine 5 mg 3 times daily with holding parameters. Monitor BP and titrate medication accordingly   # Hypophosphatemia, Phos repleted. # Hypokalemia, potassium repleted. Monitor electrolytes daily and replete as needed.  Vitamin D insufficiency: Vitamin D level 27, started vitamin D 50,000 units p.o. weekly, follow with PCP to repeat vitamin D level after 3 to 6 months.  Body mass index is 25.83 kg/m.  Interventions:   Diet: Regular diet DVT Prophylaxis: Subcutaneous Lovenox     Advance goals of care discussion: Full code   Family Communication: family was present at bedside, at the time of interview.  The pt provided permission to discuss medical plan with the family. Opportunity was given to ask question and all questions were answered  satisfactorily.    Disposition:  Pt is from home, admitted with respiratory failure, still has shortness of breath, which precludes a safe discharge. Discharge to home, when stable, possible DC tomorrow a.m.    Subjective: Patient seen and examined at bedside this  morning Patient uses 6 L oxygen at baseline at home Currently she is on 8 L HFNC and CPAP as needed Shortness of breath gradually improving, does not feel any worsening of symptoms.  Denies any chest pain today.    Physical Exam: General: NAD, lying comfortably Appear in no distress, affect appropriate Eyes: PERRLA ENT: Oral Mucosa Clear, moist  Neck: no JVD,  Cardiovascular: S1 and S2 Present, no Murmur,  Respiratory: Increased work of breathing, shortness of breath, mild crackles bilaterally, no significant wheezes  Abdomen: Bowel Sound present, Soft and no tenderness,  Skin: no rashes Extremities: no Pedal edema, no calf tenderness Neurologic: without any new focal findings Gait not checked due to patient safety concerns   Data Reviewed:    Latest Ref Rng & Units 11/16/2023    3:23 AM 11/15/2023    3:39 AM 11/14/2023    4:34 AM  CBC  WBC 4.0 - 10.5 K/uL 8.7  9.0  9.3   Hemoglobin 12.0 - 15.0 g/dL 87.9  86.7  87.0   Hematocrit 36.0 - 46.0 % 35.6  39.2  38.2   Platelets 150 - 400 K/uL 329  327  303        Latest Ref Rng & Units 11/16/2023    3:23 AM 11/15/2023    3:39 AM 11/14/2023    4:34 AM  BMP  Glucose 70 - 99 mg/dL 883  896  884   BUN 8 - 23 mg/dL 25  25  25    Creatinine 0.44 - 1.00 mg/dL 9.22  9.11  9.19   Sodium 135 - 145 mmol/L 134  133  135   Potassium 3.5 - 5.1 mmol/L 3.9  3.3  4.0   Chloride 98 - 111 mmol/L 93  91  92   CO2 22 - 32 mmol/L 31  26  28    Calcium  8.9 - 10.3 mg/dL 8.3  8.3  8.3      Vitals:   11/16/23 1000 11/16/23 1100 11/16/23 1200 11/16/23 1300  BP: (!) 100/57 105/61 101/64 103/67  Pulse:  97 93 96  Resp: (!) 28 (!) 23 18 (!) 21  Temp:      TempSrc:      SpO2:  (!) 86% 90% 91%  Weight:      Height:       Total time spent: 40 minutes  Author: Elvan Sor, MD 11/16/2023 3:57 PM  For on call review [email or web address].

## 2023-11-16 NOTE — Progress Notes (Addendum)
 Occupational Therapy Treatment Patient Details Name: Susan Sosa MRN: [id number] DOB: 1940/06/04 Today's Date: 11/16/2023   History of present illness Patient is a 83 year old female with acute on chronic respiratory failure requiring increased oxygen supplementation. PMH: pulmonary fibrosis, chronic respiratory failure, GERD, HTN, HLD.   OT comments  Chart reviewed to date, pt greeted sitting in chair, agreeable to OT tx session targeting improving funcitonal activity tolerance in prep for ADL tasks. Pt requests to amb on this date. Pt is making progress towards goals, she amb approx 64' with RW with chair follow on 10L via HFNC 2 attempts. Spo2 to lowest 76% both attempts while amb. >88% on 10 L via [place] with 3 minute rest break first attempt, after approx 1 minute second attempt. Pt does not report SOB with desaturations. Pt spo2 90% on 8L via HFNC in chair at end of session.  Educated pt re energy conservation techniques including seated showers with oxygen (and not too hot water), seated rest breaks, wheelchair for longer/community distances as needed. Pt reports she does not have a 2WW at home and is also requesting a wheelchair. Secure chat sent to [facility] to verify coverage/what may need to be private pay. Pt is left in chair, all needs met. OT will continue to follow.       If plan is discharge home, recommend the following:  A little help with walking and/or transfers;A little help with bathing/dressing/bathroom;Assistance with cooking/housework;Direct supervision/assist for financial management;Assist for transportation;Help with stairs or ramp for entrance;Supervision due to cognitive status   Equipment Recommendations  BSC/3in1;Wheelchair (measurements OT);Other (comment) (2WW)    Recommendations for Other Services      Precautions / Restrictions Precautions Precautions: Fall Recall of Precautions/Restrictions: Intact Restrictions Weight Bearing Restrictions Per Provider Order: No        Mobility Bed Mobility               General bed mobility comments: NT in recliner pre/post session    Transfers Overall transfer level: Needs assistance Equipment used: Rolling walker (2 wheels) Transfers: Sit to/from Stand Sit to Stand: Supervision                 Balance Overall balance assessment: Needs assistance Sitting-balance support: Feet supported Sitting balance-Leahy Scale: Good     Standing balance support: Bilateral upper extremity supported, Reliant on assistive device for balance, During functional activity Standing balance-Leahy Scale: Fair                             ADL either performed or assessed with clinical judgement   ADL Overall ADL's : Needs assistance/impaired                         Toilet Transfer: Supervision/safety;Rolling walker (2 wheels);Ambulation Toilet Transfer Details (indicate cue type and reason): simulated         Functional mobility during ADLs: Supervision/safety;Rolling walker (2 wheels) (amb 75' two attempts with approx 3 minute rest break in between attempts)      Extremity/Trunk Assessment              Vision       Perception     Praxis     Communication Communication Communication: No apparent difficulties   Cognition Arousal: Alert Behavior During Therapy: WFL for tasks assessed/performed Cognition: No apparent impairments  Following commands: Intact        Cueing   Cueing Techniques: Verbal cues  Exercises Other Exercises Other Exercises: edu re role of OT, role of rehab, safe ADL completion with AE/DME, energy conservation techniques    Shoulder Instructions       General Comments      Pertinent Vitals/ Pain       Pain Assessment Pain Assessment: Faces Faces Pain Scale: Hurts little more Pain Location: bilateral knee, chronic pain Pain Descriptors / Indicators: Discomfort, Aching Pain Intervention(s):  Repositioned, Monitored during session  Home Living                                          Prior Functioning/Environment              Frequency  Min 2X/week        Progress Toward Goals  OT Goals(current goals can now be found in the care plan section)  Progress towards OT goals: Progressing toward goals  Acute Rehab OT Goals Time For Goal Achievement: 11/28/23  Plan      Co-evaluation                 AM-PAC OT 6 Clicks Daily Activity     Outcome Measure   Help from another person eating meals?: None Help from another person taking care of personal grooming?: None Help from another person toileting, which includes using toliet, bedpan, or urinal?: A Little Help from another person bathing (including washing, rinsing, drying)?: A Little Help from another person to put on and taking off regular upper body clothing?: A Little Help from another person to put on and taking off regular lower body clothing?: A Little 6 Click Score: 20    End of Session Equipment Utilized During Treatment: Oxygen  OT Visit Diagnosis: Unsteadiness on feet (R26.81);Repeated falls (R29.6);Muscle weakness (generalized) (M62.81)   Activity Tolerance Patient tolerated treatment well   Patient Left in chair;with call bell/phone within reach;with family/visitor present   Nurse Communication Mobility status        Time: 1351-1417 OT Time Calculation (min): 26 min  Charges: OT General Charges $OT Visit: 1 Visit OT Treatments $Therapeutic Activity: 23-37 mins  Therisa Sheffield, OTD OTR/L  11/16/23, 3:17 PM

## 2023-11-16 NOTE — Plan of Care (Signed)

## 2023-11-17 DIAGNOSIS — J9622 Acute and chronic respiratory failure with hypercapnia: Secondary | ICD-10-CM | POA: Diagnosis not present

## 2023-11-17 DIAGNOSIS — J9621 Acute and chronic respiratory failure with hypoxia: Secondary | ICD-10-CM | POA: Diagnosis not present

## 2023-11-17 LAB — BASIC METABOLIC PANEL WITH GFR
Anion gap: 11 (ref 5–15)
BUN: 20 mg/dL (ref 8–23)
CO2: 30 mmol/L (ref 22–32)
Calcium: 8.4 mg/dL — ABNORMAL LOW (ref 8.9–10.3)
Chloride: 94 mmol/L — ABNORMAL LOW (ref 98–111)
Creatinine, Ser: 0.93 mg/dL (ref 0.44–1.00)
GFR, Estimated: >60 mL/min (ref >60)
Glucose, Bld: 119 mg/dL — ABNORMAL HIGH (ref 70–99)
Potassium: 3.6 mmol/L (ref 3.5–5.1)
Sodium: 135 mmol/L (ref 135–145)

## 2023-11-17 LAB — CULTURE, BLOOD (ROUTINE X 2)
Culture: NO GROWTH
Culture: NO GROWTH
Special Requests: ADEQUATE
Special Requests: ADEQUATE

## 2023-11-17 LAB — CBC
HCT: 34.2 % — ABNORMAL LOW (ref 36.0–46.0)
Hemoglobin: 11.7 g/dL — ABNORMAL LOW (ref 12.0–15.0)
MCH: 29.9 pg (ref 26.0–34.0)
MCHC: 34.2 g/dL (ref 30.0–36.0)
MCV: 87.5 fL (ref 80.0–100.0)
Platelets: 363 K/uL (ref 150–400)
RBC: 3.91 MIL/uL (ref 3.87–5.11)
RDW: 13.7 % (ref 11.5–15.5)
WBC: 12.3 K/uL — ABNORMAL HIGH (ref 4.0–10.5)
nRBC: 0 % (ref 0.0–0.2)

## 2023-11-17 LAB — BLOOD GAS, VENOUS
Acid-Base Excess: 10.6 mmol/L — ABNORMAL HIGH (ref 0.0–2.0)
Bicarbonate: 35.7 mmol/L — ABNORMAL HIGH (ref 20.0–28.0)
O2 Content: 7 L/min
O2 Saturation: 70 %
Patient temperature: 37
pCO2, Ven: 48 mmHg (ref 44–60)
pH, Ven: 7.48 — ABNORMAL HIGH (ref 7.25–7.43)
pO2, Ven: 40 mmHg (ref 32–45)

## 2023-11-17 LAB — PHOSPHORUS: Phosphorus: 3.2 mg/dL (ref 2.5–4.6)

## 2023-11-17 LAB — MAGNESIUM: Magnesium: 1.6 mg/dL — ABNORMAL LOW (ref 1.7–2.4)

## 2023-11-17 LAB — C-REACTIVE PROTEIN: CRP: 10.3 mg/dL — ABNORMAL HIGH

## 2023-11-17 MED ORDER — MAGNESIUM SULFATE 2 GM/50ML IV SOLN
2.0000 g | Freq: Once | INTRAVENOUS | Status: AC
  Administered 2023-11-17: 2 g via INTRAVENOUS
  Filled 2023-11-17: qty 50

## 2023-11-17 MED ORDER — MIDODRINE HCL 5 MG PO TABS
10.0000 mg | ORAL_TABLET | Freq: Three times a day (TID) | ORAL | Status: DC | PRN
  Administered 2023-11-17: 10 mg via ORAL
  Filled 2023-11-17: qty 2

## 2023-11-17 MED ORDER — ALBUTEROL SULFATE (2.5 MG/3ML) 0.083% IN NEBU
2.5000 mg | INHALATION_SOLUTION | Freq: Four times a day (QID) | RESPIRATORY_TRACT | Status: DC
  Administered 2023-11-17: 2.5 mg via RESPIRATORY_TRACT
  Filled 2023-11-17: qty 3

## 2023-11-17 MED ORDER — LEVOFLOXACIN 250 MG PO TABS
250.0000 mg | ORAL_TABLET | Freq: Every day | ORAL | Status: DC
  Administered 2023-11-18: 250 mg via ORAL
  Filled 2023-11-17: qty 1

## 2023-11-17 MED ORDER — ALBUTEROL SULFATE (2.5 MG/3ML) 0.083% IN NEBU
2.5000 mg | INHALATION_SOLUTION | Freq: Three times a day (TID) | RESPIRATORY_TRACT | Status: DC
  Administered 2023-11-17 – 2023-11-18 (×4): 2.5 mg via RESPIRATORY_TRACT
  Filled 2023-11-17 (×4): qty 3

## 2023-11-17 MED ORDER — SPIRONOLACTONE 25 MG PO TABS
25.0000 mg | ORAL_TABLET | Freq: Every day | ORAL | Status: DC
  Administered 2023-11-17 – 2023-11-18 (×2): 25 mg via ORAL
  Filled 2023-11-17 (×2): qty 1

## 2023-11-17 MED ORDER — POTASSIUM CHLORIDE 20 MEQ PO PACK
40.0000 meq | PACK | Freq: Once | ORAL | Status: AC
Start: 2023-11-17 — End: 2023-11-17
  Administered 2023-11-17: 40 meq via ORAL
  Filled 2023-11-17: qty 2

## 2023-11-17 MED ORDER — PANTOPRAZOLE SODIUM 20 MG PO TBEC
20.0000 mg | DELAYED_RELEASE_TABLET | Freq: Every day | ORAL | Status: DC
  Administered 2023-11-17 – 2023-11-18 (×2): 20 mg via ORAL
  Filled 2023-11-17 (×2): qty 1

## 2023-11-17 MED ORDER — LEVOFLOXACIN 500 MG PO TABS
500.0000 mg | ORAL_TABLET | Freq: Every day | ORAL | Status: AC
  Administered 2023-11-17: 500 mg via ORAL
  Filled 2023-11-17: qty 1

## 2023-11-17 NOTE — Progress Notes (Signed)
 Progress Note   Patient: Susan Sosa Reasons FMW:[id number] DOB: Jun 23, 1940 DOA: 11/09/2023     8 DOS: the patient was seen and examined on 11/17/2023     Brief hospital course:     [name] is an 83 year old female with history of pulmonary fibrosis with chronic hypoxemic respiratory failure, GERD,, hypertension, hyperlipidemia, who presented from [facility] clinic pulmonology clinic for chief concerns of acute on chronic respiratory failure requiring increased oxygen supplementation.   Vitals in the ED showed t 97.7, rr 29, hr 140, blood pressure 165/86, SpO2 98% on BiPAP.   Serum sodium is 134, potassium 4.1, chloride 93, bicarb 25, BUN of 20, serum creatinine 1.31, eGFR 40, nonfasting blood glucose 174, WBC 0.8, hemoglobin 12.9, platelets of 259.   Lactic acid is 1.9.  HS troponin is 120.   ED treatment: DuoNebs one-time treatment, Solu-Medrol  125 mg IV one-time dose.     Assessment and Plan:   # Acute on chronic respiratory failure  At home patient is on 6 L at baseline O2 sat was 50% on 8 L at [facility] clinic VBG showed mild hypercapnia S/p BiPAP and admitted to stepdown unit Continue supplemental O2 halogen and gradually wean down to her baseline Use Cpap prn as per Pulmo and avoid BiPAP to prevent pneumothorax due to history of underlying pulmonary fibrosis  Chest CT did not show any PE S/p IV Solu-Medrol  Now undergoing prednisone  tapering Started Brovana nebulizer twice daily Continue DuoNeb every 6 hourly prn Mucinex 600 mg p.o. twice daily, Tussionex as needed Increased PPI 40 mg BID for GI prophylaxis Continue immunomodulator , antifibrotic and pulmonary vasodilator therapy for pulmonary fibrosis Pulmonary following.  Discontinued antibiotics and continue Lasix .      # AKI (acute kidney injury) On CKD stage IIIa. BNP 1343 sCr 1.31>>0.96 Strict I's and O's BNP was elevated so Lasix  was started by pulmonary Monitor renal functions and urine output   # Elevated  troponin most likely secondary to demand ischemia due to hypoxemia Patient denied any chest pain, S/p heparin  IV infusion, discontinued  Troponin remained flat. Echo showed EF 70 to 75% Cardiology consulted and at this time chest pain thought to be noncardiac 10/19 again had chest pressure, slightly elevated troponin most likely demand ischemia due to hypoxemia   # Pneumonia Patient had fever on 10/18 WBC WNL possible due to immunocompromise status  10/19 procalcitonin 1.5, elevated S/p Ceftriaxone x 4 dose and s/p Azithromycin 500 p.o. daily x 3 doses.  10/18 blood cultures NGTD 10/23 Levaquin 500 mg x 1 dose followed by Levaquin to 50 mg p.o. daily started by pulmonary  # Hypertension with sinus tachycardia due to hypoxia Continue to monitor on telemetry Use hydralazine  as needed Monitor BP and titrate medications accordingly Continue verapamil  180 mg p.o. nightly 10/20 midodrine 10 mg p.o. 3 times daily as needed order placed 10/22 low blood pressure, s/p Midodrine 5 mg TID 10/23 continue midodrine 10 mg p.o. TID prn sbp<100 Monitor BP and titrate medication accordingly  # Hypomagnesemia, mag repleted. # Hypophosphatemia, Phos repleted. # Hypokalemia, potassium repleted. Monitor electrolytes daily and replete as needed.  Vitamin D insufficiency: Vitamin D level 27, started vitamin D 50,000 units p.o. weekly, follow with PCP to repeat vitamin D level after 3 to 6 months.  Body mass index is 25.83 kg/m.  Interventions:   Diet: Regular diet DVT Prophylaxis: Subcutaneous Lovenox     Advance goals of care discussion: Full code   Family Communication: family was present at bedside, at the  time of interview.  The pt provided permission to discuss medical plan with the family. Opportunity was given to ask question and all questions were answered satisfactorily.    Disposition:  Pt is from home, admitted with respiratory failure, still has shortness of breath, which precludes a  safe discharge. Discharge to home, when stable, possible DC tomorrow a.m.    Subjective: Patient seen and examined at bedside this morning Patient uses 6 L oxygen at baseline at home Currently she is on 8 L HFNC and CPAP as needed Shortness of breath gradually improving, does not feel any worsening of symptoms.  Denies any chest pain today.    Physical Exam: General: NAD, lying comfortably Appear in no distress, affect appropriate Eyes: PERRLA ENT: Oral Mucosa Clear, moist  Neck: no JVD,  Cardiovascular: S1 and S2 Present, no Murmur,  Respiratory: Increased work of breathing, shortness of breath, mild crackles bilaterally, no significant wheezes  Abdomen: Bowel Sound present, Soft and no tenderness,  Skin: no rashes Extremities: no Pedal edema, no calf tenderness Neurologic: without any new focal findings Gait not checked due to patient safety concerns   Data Reviewed:    Latest Ref Rng & Units 11/17/2023    4:49 AM 11/16/2023    3:23 AM 11/15/2023    3:39 AM  CBC  WBC 4.0 - 10.5 K/uL 12.3  8.7  9.0   Hemoglobin 12.0 - 15.0 g/dL 88.2  87.9  86.7   Hematocrit 36.0 - 46.0 % 34.2  35.6  39.2   Platelets 150 - 400 K/uL 363  329  327        Latest Ref Rng & Units 11/17/2023    4:49 AM 11/16/2023    3:23 AM 11/15/2023    3:39 AM  BMP  Glucose 70 - 99 mg/dL 880  883  896   BUN 8 - 23 mg/dL 20  25  25    Creatinine 0.44 - 1.00 mg/dL 9.06  9.22  9.11   Sodium 135 - 145 mmol/L 135  134  133   Potassium 3.5 - 5.1 mmol/L 3.6  3.9  3.3   Chloride 98 - 111 mmol/L 94  93  91   CO2 22 - 32 mmol/L 30  31  26    Calcium  8.9 - 10.3 mg/dL 8.4  8.3  8.3      Vitals:   11/17/23 1008 11/17/23 1100 11/17/23 1221 11/17/23 1320  BP:    (!) 89/45  Pulse:      Resp:      Temp:      TempSrc:      SpO2: (!) 89% (!) 86% 96%   Weight:      Height:       Total time spent: 40 minutes  Author: Elvan Sor, MD 11/17/2023 2:28 PM  For on call review [email or web address].

## 2023-11-17 NOTE — Progress Notes (Signed)
 PULMONOLOGY         Date: 11/17/2023,   MRN# [id number] Susan Sosa 1940/08/15     AdmissionWeight: 59 kg                 CurrentWeight: 59 kg  Referring provider: Dr Jerelene   CHIEF COMPLAINT:   Acute on chronic hypoxemic respiratory failure   HISTORY OF PRESENT ILLNESS   This is an 83 yo F with hx of chronic lung disease with IPF and chronic hypoxemia, HTN,cardiac dysfunction with AF, GERD who has been declining over past 1-2 years despite aggressive medical therapy.  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 Recent addition of Imuran due to progressive decline in PFT.  She at times gets respiratory infections but generally bounces back after course of abx and steroid taper.  She has also been on steroids with decadron with partial improvement. 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  Her PFT have also declined over last five years but FEV1 this year has been spared >80% with severe decrement in DLCO as excpected with PH and ILD.  She has a previous history of smoking for appx 40years and does have emphysematous changes overlying her UIP pattern of fibrosis. She is here with acute exacerbation of IPF with acute on chronic hypoxemic respiratory failure. She was noted to have elevated BNP >1343, and did have TTE last month showing impaired diastology.  PCCM consultation placed for management of complex pulmonary patient with advanced IPF with chronic hypoxemia.   11/11/23- patient with improvement overnight now on 10L/min [place] .  CRP markedly elevated consistent with AEIPF, negative viral workup.  Will do CTPE today due to elevated ddimer and hypoxemia to rule out PE.   11/12/23- patient  still requires HFNC with episodes of desaturation.  She has family at bedside and friends visiting today.  We discussed her CT chest with no PE and no pneumonia but there is interstitial edema which she seems to be able to diurese well with current diuretics.   11/13/23- patient is on 12L/min bubbler HF, she is on tapering dose of prednisone , imuran home dose, tyvasso home dose and lasix  with an extra dose to day to expedite diuresis.  She feels overall improved. She has HF at home which can deliver 15L/min.    11/14/23- patient is down to 10L overnight but had episode of desaturation. We repositioned her and she improved.  She will continue to diurese and work with PT today for safe discharge once closer to baseline.   11/15/23- patient on CPAP overnight , she did have desaturation event on 8L/min. Patient with normal BP on diuresis on lasix  40 daily.  I have dcd midodrine because patient is on antihypertensive medications, I have dcd rocephin since we have no evidence of ongoing infection and she has already been on it 4 days.  Id like to reduce volume of infusions with goal net negative fluid balance.  She continues on immunomodulator , antifibrotic and pulmonary vasodilator therapy and nebulizer therapy.  PT/OT is seeing her.   11/16/23- patient on 8L/min this am,  she walked around with PT/OT yesterday in hallway and had mild transient desaturation.  Electrolytes are better, CRP is  trending down.  Patient speaking in full sentences and is now on dc Planning phase of care.   11/17/23- patient is on 8L/min at rest this am.  She is feeling better and stronger and is working with PT.  Ive refined her therapy with discontinuation of centrally acting medications to allow her to be more stable on her feet including dc morphine  and ativan.  I have also dcd midodrine because she generally does not require this at home and is on hypertensive side.   PAST MEDICAL HISTORY   Past Medical History:  Diagnosis  Date   Arthritis    Heart murmur    Hyperlipidemia    Hypertension    Hyponatremia    IPF (idiopathic pulmonary fibrosis) (HCC)      SURGICAL HISTORY   Past Surgical History:  Procedure Laterality Date   LEFT HEART CATH AND CORONARY ANGIOGRAPHY N/A 11/27/2021   Procedure: LEFT HEART CATH AND CORONARY ANGIOGRAPHY and possible pci and stent;  Surgeon: Florencio Cara BIRCH, MD;  Location: [facility] INVASIVE CV LAB;  Service: Cardiovascular;  Laterality: N/A;   RIGHT HEART CATH Right 08/10/2023   Procedure: RIGHT HEART CATH;  Surgeon: Florencio Cara BIRCH, MD;  Location: [facility] INVASIVE CV LAB;  Service: Cardiovascular;  Laterality: Right;     FAMILY HISTORY   Family History  Problem Relation Age of Onset   Stroke Mother    Coronary artery disease Mother    Coronary artery disease Father    Stroke Father    Diabetes Father      SOCIAL HISTORY   Social History   Tobacco Use   Smoking status: Former   Smokeless tobacco: Never   Tobacco comments:    Quit 30 years ago  Substance Use Topics   Alcohol use: Never   Drug use: Never     MEDICATIONS    Home Medication:    Current Medication:  Current Facility-Administered Medications:    alum & mag hydroxide-simeth (MAALOX/MYLANTA) 200-200-20 MG/5ML suspension 30 mL, 30 mL, Oral, Q4H PRN, Von Bellis, MD, 30 mL at 11/13/23 1209   arformoterol (BROVANA) nebulizer solution 15 mcg, 15 mcg, Nebulization, BID, 15 mcg at 11/16/23 1957 **AND** [DISCONTINUED] budesonide (PULMICORT) nebulizer solution 0.25 mg, 0.25 mg, Nebulization, BID, Von Bellis, MD, 0.25 mg at 11/10/23 9176   aspirin  chewable tablet 81 mg, 81 mg, Oral, QPM, Cox, Amy N, DO, 81 mg at 11/16/23 1615   azaTHIOprine (IMURAN) tablet 50 mg, 50 mg, Oral, BID, Quinesha Selinger, MD, 50 mg at 11/16/23 2116   azithromycin (ZITHROMAX) tablet 250 mg, 250 mg, Oral, Daily, Beaux Wedemeyer, MD, 250 mg at 11/16/23 9144   Chlorhexidine Gluconate Cloth 2 % PADS 6 each, 6 each, Topical,  Daily, Mansy, Jan A, MD, 6 each at 11/16/23 9146   chlorpheniramine-HYDROcodone (TUSSIONEX) 10-8 MG/5ML suspension 5 mL, 5 mL, Oral, Q12H PRN, Von Bellis, MD   enoxaparin  (LOVENOX ) injection 40 mg, 40 mg, Subcutaneous, QPM, Von Bellis, MD, 40 mg at 11/16/23 1615   furosemide  (LASIX ) injection 40 mg, 40 mg, Intravenous, Daily, Ezechiel Stooksbury, MD, 40 mg at 11/16/23 0852   guaiFENesin (MUCINEX) 12 hr tablet 600 mg, 600 mg, Oral, BID, Von Bellis, MD, 600 mg at 11/16/23 2116   LORazepam (ATIVAN) injection 0.5 mg, 0.5 mg, Intravenous, Q6H PRN, Cox, Amy N, DO, 0.5 mg at 11/12/23 0205   midodrine (PROAMATINE) tablet 5 mg, 5 mg, Oral, TID WC, Von Bellis, MD, 5 mg at 11/16/23 1615   morphine  (PF) 2 MG/ML injection 2  mg, 2 mg, Intravenous, Q4H PRN, Von Bellis, MD   nitroGLYCERIN  (NITROSTAT ) SL tablet 0.4 mg, 0.4 mg, Sublingual, Q5 min PRN, Cox, Amy N, DO   Oral care mouth rinse, 15 mL, Mouth Rinse, PRN, Dorinda, Drue DASEN, MD   pantoprazole  (PROTONIX ) EC tablet 40 mg, 40 mg, Oral, BID, Viviana Leonor BROCKS, [place], 40 mg at 11/16/23 2115   Pirfenidone  TABS 534 mg, 534 mg, Oral, TID with meals, Von Bellis, MD, 534 mg at 11/16/23 1615   [COMPLETED] methylPREDNISolone  sodium succinate (SOLU-MEDROL ) 40 mg/mL injection 40 mg, 40 mg, Intravenous, Q12H, 40 mg at 11/10/23 1959 **FOLLOWED BY** [COMPLETED] methylPREDNISolone  sodium succinate (SOLU-MEDROL ) 40 mg/mL injection 40 mg, 40 mg, Intravenous, Q24H, 40 mg at 11/11/23 0929 **FOLLOWED BY** [COMPLETED] predniSONE  (DELTASONE ) tablet 40 mg, 40 mg, Oral, Q breakfast, 40 mg at 11/13/23 0901 **FOLLOWED BY** [COMPLETED] predniSONE  (DELTASONE ) tablet 30 mg, 30 mg, Oral, Q breakfast, 30 mg at 11/16/23 0851 **FOLLOWED BY** predniSONE  (DELTASONE ) tablet 20 mg, 20 mg, Oral, Q breakfast **FOLLOWED BY** [START ON 11/20/2023] predniSONE  (DELTASONE ) tablet 10 mg, 10 mg, Oral, Q breakfast, Von Bellis, MD   senna-docusate (Senokot-S) tablet 1 tablet, 1 tablet, Oral, QHS PRN,  Cox, Amy N, DO   Treprostinil  POWD 16 mcg, 16 mcg, Inhalation, QID, Nada Adriana BIRCH, RPH, 16 mcg at 11/16/23 2114   verapamil  (CALAN -SR) CR tablet 120 mg, 120 mg, Oral, QHS, Brittnee Gaetano, MD, 120 mg at 11/16/23 2116   Vitamin D (Ergocalciferol) (DRISDOL) 1.25 MG (50000 UNIT) capsule 50,000 Units, 50,000 Units, Oral, Q7 days, Von Bellis, MD, 50,000 Units at 11/16/23 1315    ALLERGIES   Latex, Lidocaine, Atorvastatin , Losartan , Metronidazole, and Oxycodone      REVIEW OF SYSTEMS    Review of Systems:  Gen:  Denies  fever, sweats, chills weigh loss  HEENT: Denies blurred vision, double vision, ear pain, eye pain, hearing loss, nose bleeds, sore throat Cardiac:  No dizziness, chest pain or heaviness, chest tightness,edema Resp:   reports dyspnea chronically  Gi: Denies swallowing difficulty, stomach pain, nausea or vomiting, diarrhea, constipation, bowel incontinence Gu:  Denies bladder incontinence, burning urine Ext:   Denies Joint pain, stiffness or swelling Skin: Denies  skin rash, easy bruising or bleeding or hives Endoc:  Denies polyuria, polydipsia , polyphagia or weight change Psych:   Denies depression, insomnia or hallucinations   Other:  All other systems negative   VS: BP 119/60   Pulse 81   Temp 98.4 F (36.9 C) (Oral)   Resp (!) 32   Ht 4' 11.5 (1.511 m)   Wt 59 kg   SpO2 95%   BMI 25.83 kg/m      PHYSICAL EXAM    GENERAL:NAD, no fevers, chills, no weakness no fatigue HEAD: Normocephalic, atraumatic.  EYES: Pupils equal, round, reactive to light. Extraocular muscles intact. No scleral icterus.  MOUTH: Moist mucosal membrane. Dentition intact. No abscess noted.  EAR, NOSE, THROAT: Clear without exudates. No external lesions.  NECK: Supple. No thyromegaly. No nodules. No JVD.  PULMONARY: decreased breath sounds with mild rhonchi worse at bases bilaterally.  CARDIOVASCULAR: S1 and S2. Regular rate and rhythm. No murmurs, rubs, or gallops. No edema.  Pedal pulses 2+ bilaterally.  GASTROINTESTINAL: Soft, nontender, nondistended. No masses. Positive bowel sounds. No hepatosplenomegaly.  MUSCULOSKELETAL: No swelling, clubbing, or edema. Range of motion full in all extremities.  NEUROLOGIC: Cranial nerves II through XII are intact. No gross focal neurological deficits. Sensation intact. Reflexes intact.  SKIN: No ulceration, lesions, rashes, or  cyanosis. Skin warm and dry. Turgor intact.  PSYCHIATRIC: Mood, affect within normal limits. The patient is awake, alert and oriented x 3. Insight, judgment intact.       IMAGING   @IMAGES @   ASSESSMENT/PLAN   Acute on chronic hypoxemic respiratory failure       Currently negative for viral LRTI due to negative RVP and SARS/FLU/RSV panel      - inflammatory workup - are elevated due to IPF exacerbation      - repeat TTE with grade 1 diastolic and moderate RV dysfunction.  Despite this she is fluid overloaded and responds well to diuresis.  Her CT showed interstitial edema overlying chronic pulmonary fibrosis.     Idiopathic pulmonary fibrosis with acute exacerbation    - advanced and progressed over past 5 years    - will continue steroids at current dose with taper   Centrilobular emphysema -no signs of acute exacerbation of COPD  - viral workup and inflammatory biomarkers are negative   - duoneb prn is sufficient at this time   -dc rocephin continue levoquin PO  Pulmonary hypertension associated with pulmonary fibrosis    - will dc tyvasso DPI at home with poor tolerance will switch to [place] going forward  -pre and post capillary per RHC done this year   -PT/OT ordered  -DC midodrine  Bibasilar atelectasis   - recruitement maneuvers via chest physiotherapy utilizing metaneb with duoneb     - PT /OT  Physical deconditioning   Continue PT/OT , continue chest PT  BODE score >8 with poor long term prognosis   Goals of care     - patient is currently DNR      Thank you for  allowing me to participate in the care of this patient.   Patient/Family are satisfied with care plan and all questions have been answered.    Provider disclosure: Patient with at least one acute or chronic illness or injury that poses a threat to life or bodily function and is being managed actively during this encounter.  All of the below services have been performed independently by signing provider:  review of prior documentation from internal and or external health records.  Review of previous and current lab results.  Interview and comprehensive assessment during patient visit today. Review of current and previous chest radiographs/CT scans. Discussion of management and test interpretation with health care team and patient/family.   This document was prepared using Dragon voice recognition software and may include unintentional dictation errors.     Marilene Vath, M.D.  Division of Pulmonary & Critical Care Medicine

## 2023-11-17 NOTE — Plan of Care (Signed)

## 2023-11-17 NOTE — Progress Notes (Signed)
 Physical Therapy Treatment Patient Details Name: Susan Sosa MRN: [id number] DOB: 11-17-1940 Today's Date: 11/17/2023   History of Present Illness Patient is a 83 year old female with acute on chronic respiratory failure requiring increased oxygen supplementation. PMH: pulmonary fibrosis, chronic respiratory failure, GERD, HTN, HLD.    PT Comments  Patient is agreeable to PT session. She participated with LE therapeutic exercises for strengthening. Sp02 briefly down to 86% on 8L 02 and increased to 91% with cues for breathing techniques. Education on energy conservation strategies to use in home setting. Recommend to continue PT to maximize independence.    If plan is discharge home, recommend the following: Assist for transportation;Help with stairs or ramp for entrance;Assistance with cooking/housework   Can travel by Doctor, hospital (measurements PT);Rolling walker (2 wheels)    Recommendations for Other Services       Precautions / Restrictions Precautions Precautions: Fall Recall of Precautions/Restrictions: Intact Restrictions Weight Bearing Restrictions Per Provider Order: No     Mobility  Bed Mobility               General bed mobility comments: not assessed as patient sitting up on arrival and post session    Transfers                   General transfer comment: patient declined today. she was already seated in the chair and agreeable to exercises    Ambulation/Gait                   Stairs             Wheelchair Mobility     Tilt Bed    Modified Rankin (Stroke Patients Only)       Balance Overall balance assessment: Needs assistance Sitting-balance support: Feet supported Sitting balance-Leahy Scale: Good                                      Communication Communication Communication: No apparent difficulties  Cognition Arousal: Alert Behavior During  Therapy: WFL for tasks assessed/performed   PT - Cognitive impairments: No apparent impairments                         Following commands: Intact      Cueing Cueing Techniques: Verbal cues  Exercises General Exercises - Lower Extremity Ankle Circles/Pumps: AROM, Strengthening, Both, 10 reps, Seated (2 sets) Long Arc Quad: AROM, AAROM, Strengthening, Both, 10 reps, Seated (2 sets) Hip ABduction/ADduction: AROM, AAROM, Strengthening, Both, 10 reps, Seated (2 sets) Hip Flexion/Marching: AROM, AAROM, Strengthening, Both, 10 reps, Seated (2 sets) Other Exercises Other Exercises: cues for exercise technique for strengthening    General Comments General comments (skin integrity, edema, etc.): education on energy conservation strategies, breathing techniques, taking fequent rest breaks, etc. and strateiges to use in home setting. Sp02 down to 86% briefly with exericse but increased to 91% with brief rest break      Pertinent Vitals/Pain Pain Assessment Pain Assessment: Faces Faces Pain Scale: Hurts a little bit Pain Location: right knee more than left knee chronic bone on bone Pain Descriptors / Indicators: Discomfort Pain Intervention(s): Limited activity within patient's tolerance, Monitored during session, Repositioned    Home Living  Prior Function            PT Goals (current goals can now be found in the care plan section) Acute Rehab PT Goals Patient Stated Goal: to go home PT Goal Formulation: With patient Time For Goal Achievement: 11/27/23 Potential to Achieve Goals: Fair Progress towards PT goals: Progressing toward goals    Frequency    Min 2X/week      PT Plan      Co-evaluation              AM-PAC PT 6 Clicks Mobility   Outcome Measure  Help needed turning from your back to your side while in a flat bed without using bedrails?: A Little Help needed moving from lying on your back to sitting on the  side of a flat bed without using bedrails?: A Little Help needed moving to and from a bed to a chair (including a wheelchair)?: A Little Help needed standing up from a chair using your arms (e.g., wheelchair or bedside chair)?: A Little Help needed to walk in hospital room?: A Little Help needed climbing 3-5 steps with a railing? : A Little 6 Click Score: 18    End of Session Equipment Utilized During Treatment: Oxygen (8 L02) Activity Tolerance: Patient limited by fatigue Patient left: in chair;with call bell/phone within reach Nurse Communication: Mobility status PT Visit Diagnosis: Muscle weakness (generalized) (M62.81);Unsteadiness on feet (R26.81)     Time: 1440-1457 PT Time Calculation (min) (ACUTE ONLY): 17 min  Charges:    $Therapeutic Exercise: 8-22 mins PT General Charges $$ ACUTE PT VISIT: 1 Visit                     Randine Essex, PT, MPT    Randine LULLA Essex 11/17/2023, 3:10 PM

## 2023-11-17 NOTE — TOC Progression Note (Signed)
 Transition of Care [facility]) - Progression Note    Patient Details  Name: Susan Sosa MRN: [id number] Date of Birth: [birth year]  [facility] of [facility] CM/SW Contact  K'La JINNY Ruts, LCSW Phone Number: 11/17/2023, 11:28 AM  Clinical Narrative:    Chart reviewed. I received a a secure chat the patient wanted to know what equipment her insurance will pay for. I spoke with Mitch from [employer] and he explained that most insurances will not pay for a RW and WC. [name] was explaining that the patient could run insurance for the [facility] and to do private pay for the RW. I explained this information to the patient and her husband at bedside today and they both agreed to it at D/C. I will reach out to [employer] to get DME delivered to patient at D/C.      Barriers to Discharge: Continued Medical Work up               Expected Discharge Plan and Services       Living arrangements for the past 2 months: Single Family Home                                       Social Drivers of Health (SDOH) Interventions SDOH Screenings   Food Insecurity: No Food Insecurity (11/09/2023)  Housing: Low Risk  (11/09/2023)  Transportation Needs: No Transportation Needs (11/09/2023)  Utilities: Not At Risk (11/09/2023)  Depression (PHQ2-9): Medium Risk (12/23/2020)  Financial Resource Strain: Low Risk  (03/30/2023)   Received from [facility]  Social Connections: Socially Integrated (11/09/2023)  Tobacco Use: Medium Risk (11/09/2023)   Received from [facility]    Readmission Risk Interventions    11/11/2023   10:49 AM  Readmission Risk Prevention Plan  Transportation Screening Complete  PCP or Specialist Appt within 3-5 Days Complete  Social Work Consult for Recovery Care Planning/Counseling Complete  Palliative Care Screening Not Applicable  Medication Review Oceanographer) Complete

## 2023-11-18 ENCOUNTER — Other Ambulatory Visit: Payer: Self-pay

## 2023-11-18 ENCOUNTER — Inpatient Hospital Stay

## 2023-11-18 DIAGNOSIS — J9622 Acute and chronic respiratory failure with hypercapnia: Secondary | ICD-10-CM | POA: Diagnosis not present

## 2023-11-18 DIAGNOSIS — J9621 Acute and chronic respiratory failure with hypoxia: Secondary | ICD-10-CM | POA: Diagnosis not present

## 2023-11-18 LAB — BLOOD GAS, ARTERIAL
Acid-Base Excess: 8.5 mmol/L — ABNORMAL HIGH (ref 0.0–2.0)
Bicarbonate: 31.8 mmol/L — ABNORMAL HIGH (ref 20.0–28.0)
O2 Content: 8 L/min
O2 Saturation: 96.2 %
Patient temperature: 37
pCO2 arterial: 38 mmHg (ref 32–48)
pH, Arterial: 7.53 — ABNORMAL HIGH (ref 7.35–7.45)
pO2, Arterial: 89 mmHg (ref 83–108)

## 2023-11-18 LAB — BASIC METABOLIC PANEL WITH GFR
Anion gap: 11 (ref 5–15)
BUN: 19 mg/dL (ref 8–23)
CO2: 29 mmol/L (ref 22–32)
Calcium: 8.5 mg/dL — ABNORMAL LOW (ref 8.9–10.3)
Chloride: 93 mmol/L — ABNORMAL LOW (ref 98–111)
Creatinine, Ser: 0.91 mg/dL (ref 0.44–1.00)
GFR, Estimated: >60 mL/min (ref >60)
Glucose, Bld: 111 mg/dL — ABNORMAL HIGH (ref 70–99)
Potassium: 4 mmol/L (ref 3.5–5.1)
Sodium: 133 mmol/L — ABNORMAL LOW (ref 135–145)

## 2023-11-18 LAB — CBC
HCT: 36 % (ref 36.0–46.0)
Hemoglobin: 11.9 g/dL — ABNORMAL LOW (ref 12.0–15.0)
MCH: 29.5 pg (ref 26.0–34.0)
MCHC: 33.1 g/dL (ref 30.0–36.0)
MCV: 89.3 fL (ref 80.0–100.0)
Platelets: 416 K/uL — ABNORMAL HIGH (ref 150–400)
RBC: 4.03 MIL/uL (ref 3.87–5.11)
RDW: 13.8 % (ref 11.5–15.5)
WBC: 13.1 K/uL — ABNORMAL HIGH (ref 4.0–10.5)
nRBC: 0 % (ref 0.0–0.2)

## 2023-11-18 LAB — MAGNESIUM: Magnesium: 2.1 mg/dL (ref 1.7–2.4)

## 2023-11-18 LAB — PHOSPHORUS: Phosphorus: 3.8 mg/dL (ref 2.5–4.6)

## 2023-11-18 MED ORDER — VITAMIN D 25 MCG (1000 UNIT) PO TABS: 2000.0000 [IU] | ORAL_TABLET | Freq: Every day | ORAL | Status: DC

## 2023-11-18 MED ORDER — FUROSEMIDE 10 MG/ML IJ SOLN: 40.0000 mg | Freq: Two times a day (BID) | INTRAMUSCULAR | Status: DC

## 2023-11-18 MED ORDER — LEVOFLOXACIN 250 MG PO TABS
250.0000 mg | ORAL_TABLET | Freq: Every day | ORAL | 0 refills | Status: AC
  Filled 2023-11-18: qty 3, 3d supply, fill #0

## 2023-11-18 MED ORDER — LORAZEPAM 2 MG/ML IJ SOLN
1.0000 mg | Freq: Once | INTRAMUSCULAR | Status: AC
  Administered 2023-11-18: 1 mg via INTRAVENOUS
  Filled 2023-11-18: qty 1

## 2023-11-18 MED ORDER — POTASSIUM CHLORIDE CRYS ER 20 MEQ PO TBCR
20.0000 meq | EXTENDED_RELEASE_TABLET | Freq: Every day | ORAL | 0 refills | Status: AC
  Filled 2023-11-18: qty 30, 30d supply, fill #0

## 2023-11-18 MED ORDER — FUROSEMIDE 40 MG PO TABS
40.0000 mg | ORAL_TABLET | Freq: Every day | ORAL | 11 refills | Status: DC
  Filled 2023-11-18: qty 30, 30d supply, fill #0

## 2023-11-18 MED ORDER — SPIRONOLACTONE 25 MG PO TABS
25.0000 mg | ORAL_TABLET | Freq: Every day | ORAL | 11 refills | Status: AC
  Filled 2023-11-18: qty 30, 30d supply, fill #0
  Filled 2023-12-16: qty 30, 30d supply, fill #1
  Filled 2024-01-24: qty 30, 30d supply, fill #2

## 2023-11-18 MED ORDER — DEXTROMETHORPHAN-GUAIFENESIN 20-200 MG/20ML PO LIQD
10.0000 mL | Freq: Four times a day (QID) | ORAL | 0 refills | Status: AC | PRN
  Filled 2023-11-18: qty 236, 6d supply, fill #0

## 2023-11-18 MED ORDER — MIDODRINE HCL 10 MG PO TABS
10.0000 mg | ORAL_TABLET | Freq: Three times a day (TID) | ORAL | 0 refills | Status: AC | PRN
  Filled 2023-11-18: qty 30, 10d supply, fill #0

## 2023-11-18 MED ORDER — LOSARTAN POTASSIUM 50 MG PO TABS: 25.0000 mg | ORAL_TABLET | Freq: Every day | ORAL | Status: AC | PRN

## 2023-11-18 MED ORDER — PREDNISONE 10 MG PO TABS
ORAL_TABLET | ORAL | 0 refills | Status: AC
  Filled 2023-11-18: qty 7, 8d supply, fill #0

## 2023-11-18 NOTE — Progress Notes (Signed)
 Physical Therapy Treatment Patient Details Name: Susan Sosa MRN: [id number] DOB: [birth year] Today's Date: 11/18/2023   History of Present Illness Patient is a 83 year old female with acute on chronic respiratory failure requiring increased oxygen supplementation. PMH: pulmonary fibrosis, chronic respiratory failure, GERD, HTN, HLD.    PT Comments  Patient is agreeable to PT session. She was sitting up in the chair on 6 L02. She reports she wants to go home. Patient ambulated on 6 L 02, 50 ft with rolling walker. She desaturates to 67% on 6L and is not symptomatic, talking, and reports feeling fine. Briefly bumped up to 8L 02 and patient quickly recovers to 93% with seated rest break. Returned to 6L02  at end of session with Sp02 92-93% at rest. Education provided to patient and family on energy conservation strategies to use in home setting, including using rolling walker for ambulation for safety as well has having the wheelchair option for longer distances for safety. PT will continue to follow to maximize independence.    If plan is discharge home, recommend the following: Assist for transportation;Help with stairs or ramp for entrance;Assistance with cooking/housework   Can travel by Doctor, hospital (measurements PT);Rolling walker (2 wheels)    Recommendations for Other Services       Precautions / Restrictions Precautions Precautions: Fall Recall of Precautions/Restrictions: Intact Restrictions Weight Bearing Restrictions Per Provider Order: No     Mobility  Bed Mobility               General bed mobility comments: not assessed as patient sitting up on arrival and post session    Transfers Overall transfer level: Needs assistance Equipment used: Rolling walker (2 wheels) Transfers: Sit to/from Stand Sit to Stand: Min assist           General transfer comment: lifting assistance for standing. cues for safety     Ambulation/Gait Ambulation/Gait assistance: Contact guard assist Gait Distance (Feet): 50 Feet Assistive device: Rolling walker (2 wheels) Gait Pattern/deviations: Step-through pattern Gait velocity: decreased     General Gait Details: slow but steady with rolling walker for support. Sp02 down to 67% on 6 L and turned 02 to 8L02. with seated rest break, patient quickly increased back to 92-93%. she is generally unaware when Sp02 drops. education on breathing techniques, monitoring for signs of fatigue, taking rest breaks frequently, and other energy conservation strategies   Stairs             Wheelchair Mobility     Tilt Bed    Modified Rankin (Stroke Patients Only)       Balance Overall balance assessment: Needs assistance Sitting-balance support: Feet supported Sitting balance-Leahy Scale: Good     Standing balance support: Bilateral upper extremity supported, Reliant on assistive device for balance, During functional activity Standing balance-Leahy Scale: Fair Standing balance comment: using rolling walker for support in standing                            Communication Communication Communication: No apparent difficulties  Cognition Arousal: Alert Behavior During Therapy: WFL for tasks assessed/performed   PT - Cognitive impairments: No apparent impairments                         Following commands: Intact      Cueing Cueing Techniques: Verbal cues  Exercises  General Comments General comments (skin integrity, edema, etc.): spouse at the bedside. patient anticipated to get a rolling walker and wheelchair for home use for energy conservation and fall prevention given limited overall endurance. patient at 93% on end of session on 6L02.      Pertinent Vitals/Pain Pain Assessment Pain Assessment: Faces Faces Pain Scale: Hurts a little bit Pain Location: chronic knee pain Pain Descriptors / Indicators: Discomfort Pain  Intervention(s): Limited activity within patient's tolerance, Monitored during session, Repositioned    Home Living                          Prior Function            PT Goals (current goals can now be found in the care plan section) Acute Rehab PT Goals Patient Stated Goal: to go home PT Goal Formulation: With patient Time For Goal Achievement: 11/27/23 Potential to Achieve Goals: Fair Progress towards PT goals: Progressing toward goals    Frequency    Min 2X/week      PT Plan      Co-evaluation              AM-PAC PT 6 Clicks Mobility   Outcome Measure  Help needed turning from your back to your side while in a flat bed without using bedrails?: A Little Help needed moving from lying on your back to sitting on the side of a flat bed without using bedrails?: A Little Help needed moving to and from a bed to a chair (including a wheelchair)?: A Little Help needed standing up from a chair using your arms (e.g., wheelchair or bedside chair)?: A Little Help needed to walk in hospital room?: A Little Help needed climbing 3-5 steps with a railing? : A Little 6 Click Score: 18    End of Session Equipment Utilized During Treatment: Oxygen (6 L02) Activity Tolerance: Patient tolerated treatment well;Patient limited by fatigue Patient left: in chair;with call bell/phone within reach;with family/visitor present Nurse Communication: Mobility status PT Visit Diagnosis: Muscle weakness (generalized) (M62.81);Unsteadiness on feet (R26.81)     Time: 8689-8666 PT Time Calculation (min) (ACUTE ONLY): 23 min  Charges:    $Therapeutic Activity: 23-37 mins PT General Charges $$ ACUTE PT VISIT: 1 Visit                     Susan Sosa, PT, MPT    Susan LULLA Sosa 11/18/2023, 1:47 PM

## 2023-11-18 NOTE — Progress Notes (Signed)
 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 Recent addition of Imuran due to progressive decline in PFT.  She at times gets respiratory infections but generally bounces back after course of abx and steroid taper.  She has also been on steroids with decadron with partial improvement. 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  Her PFT have also declined over last five years but FEV1 this year has been spared >80% with severe decrement in DLCO as excpected with PH and ILD.  She has a previous history of smoking for appx 40years and does have emphysematous changes overlying her UIP pattern of fibrosis. She is here with acute exacerbation of IPF with acute on chronic hypoxemic respiratory failure. She was noted to have elevated BNP >1343, and did have TTE last month showing impaired diastology.  PCCM consultation placed for management of complex pulmonary patient with advanced IPF with chronic hypoxemia.   11/11/23- patient with improvement overnight now on 10L/min [place] .  CRP markedly elevated consistent with AEIPF, negative viral workup.  Will do CTPE today due to elevated ddimer and hypoxemia to rule out PE.   11/12/23- patient  still requires HFNC with episodes of desaturation.  She has family at bedside and friends visiting today.  We discussed her CT chest with no PE and no pneumonia but there is interstitial edema which she seems to be able to diurese well with current diuretics.   11/13/23- patient is on 12L/min [place] HF, she is on tapering dose of prednisone , imuran home dose, tyvasso home dose and lasix  with an extra dose to day to expedite diuresis.  She feels overall improved. She has HF at home which can deliver 15L/min.    11/14/23- patient is down to 10L overnight but had episode of desaturation. We repositioned her and she improved.  She will continue to diurese and work with PT today for safe discharge once closer to baseline.   11/15/23- patient on CPAP overnight , she did have desaturation event on 8L/min. Patient with normal BP on diuresis on lasix  40 daily.  I have dcd midodrine because patient is on antihypertensive medications, I have dcd rocephin since we have no evidence of ongoing infection and she has already been on it 4 days.  Id like to reduce volume of infusions with goal net negative fluid balance.  She continues on immunomodulator , antifibrotic and pulmonary vasodilator therapy and nebulizer therapy.  PT/OT is seeing her.   11/16/23- patient on 8L/min this am,  she walked around with PT/OT yesterday in hallway and had mild transient desaturation.  Electrolytes are better, CRP is  trending down.  Patient speaking in full sentences and is now on dc Planning phase of care.   11/17/23- patient is on 8L/min at rest this am.  She is feeling better and stronger and is working with PT.  Ive refined her therapy with discontinuation of centrally acting medications to allow her to be more stable on her feet including dc morphine  and ativan.  I have also dcd midodrine because she generally does not require this at home and is on hypertensive side.   11/18/23- patient sitting up in chair and reports feeling  good and asking about going home.  She is on 8L/min [place], her pulse oximeter is inconsitent due to severe clubbing of fingers and toes.  I have ordered ABG today.  She continues to diurese well.   PAST MEDICAL HISTORY   Past Medical History:  Diagnosis Date   Arthritis    Heart murmur    Hyperlipidemia    Hypertension    Hyponatremia    IPF (idiopathic pulmonary fibrosis) (HCC)      SURGICAL HISTORY   Past Surgical History:  Procedure Laterality Date   LEFT HEART CATH AND CORONARY ANGIOGRAPHY N/A 11/27/2021   Procedure: LEFT HEART CATH AND CORONARY ANGIOGRAPHY and possible pci and stent;  Surgeon: Florencio Cara BIRCH, MD;  Location: [facility];  Service: Cardiovascular;  Laterality: N/A;   RIGHT HEART CATH Right 08/10/2023   Procedure: RIGHT HEART CATH;  Surgeon: Florencio Cara BIRCH, MD;  Location: [facility];  Service: Cardiovascular;  Laterality: Right;     FAMILY HISTORY   Family History  Problem Relation Age of Onset   Stroke Mother    Coronary artery disease Mother    Coronary artery disease Father    Stroke Father    Diabetes Father      SOCIAL HISTORY   Social History   Tobacco Use   Smoking status: Former   Smokeless tobacco: Never   Tobacco comments:    Quit 30 years ago  Substance Use Topics   Alcohol use: Never   Drug use: Never     MEDICATIONS    Home Medication:    Current Medication:  Current Facility-Administered Medications:    albuterol  (PROVENTIL ) (2.5 MG/3ML) 0.083% nebulizer solution 2.5 mg, 2.5 mg, Nebulization, TID, Lasheka Kempner, MD, 2.5 mg at 11/18/23 9287   alum & mag hydroxide-simeth (MAALOX/MYLANTA) 200-200-20 MG/5ML suspension 30 mL, 30 mL, Oral, Q4H PRN, Von Bellis, MD, 30 mL at 11/13/23 1209   aspirin  chewable tablet 81 mg, 81 mg, Oral, QPM, Cox, Amy N, DO, 81 mg at 11/17/23 1718   azaTHIOprine (IMURAN) tablet 50 mg, 50 mg, Oral, BID, Sharlotte Baka, MD, 50 mg at 11/17/23 2158   Chlorhexidine Gluconate  Cloth 2 % PADS 6 each, 6 each, Topical, Daily, Mansy, Jan A, MD, 6 each at 11/17/23 1049   chlorpheniramine-HYDROcodone (TUSSIONEX) 10-8 MG/5ML suspension 5 mL, 5 mL, Oral, Q12H PRN, Von Bellis, MD   enoxaparin  (LOVENOX ) injection 40 mg, 40 mg, Subcutaneous, QPM, Von Bellis, MD, 40 mg at 11/17/23 1718   furosemide  (LASIX ) injection 40 mg, 40 mg, Intravenous, Daily, Treon Kehl, MD, 40 mg at 11/17/23 1046   guaiFENesin (MUCINEX) 12 hr tablet 600 mg, 600 mg, Oral, BID, Von Bellis, MD, 600 mg at 11/17/23 2158   levofloxacin (LEVAQUIN) tablet 250 mg, 250 mg, Oral, Daily, Niels Barrio F, RPH   midodrine (PROAMATINE) tablet 10 mg, 10 mg, Oral, TID PRN, Von Bellis, MD, 10 mg at 11/17/23  1321   nitroGLYCERIN  (NITROSTAT ) SL tablet 0.4 mg, 0.4 mg, Sublingual, Q5 min PRN, Cox, Amy N, DO   Oral care mouth rinse, 15 mL, Mouth Rinse, PRN, Dorinda, Drue DASEN, MD   pantoprazole  (PROTONIX ) EC tablet 20 mg, 20 mg, Oral, Daily, Prajwal Fellner, MD, 20 mg at 11/17/23 1046   Pirfenidone  TABS 534 mg, 534 mg, Oral, TID with meals, Von Bellis, MD, 534 mg at 11/17/23 1718   [COMPLETED] methylPREDNISolone  sodium succinate (SOLU-MEDROL ) 40 mg/mL injection 40 mg, 40 mg, Intravenous, Q12H, 40 mg at 11/10/23 1959 **FOLLOWED BY** [COMPLETED] methylPREDNISolone  sodium succinate (SOLU-MEDROL ) 40 mg/mL injection 40 mg, 40 mg, Intravenous, Q24H, 40 mg at 11/11/23 0929 **FOLLOWED BY** [COMPLETED] predniSONE  (DELTASONE ) tablet 40 mg, 40 mg, Oral, Q breakfast, 40 mg at 11/13/23 0901 **FOLLOWED BY** [COMPLETED] predniSONE  (DELTASONE ) tablet 30 mg, 30 mg, Oral, Q breakfast, 30 mg at 11/16/23 0851 **FOLLOWED BY** predniSONE  (DELTASONE ) tablet 20 mg, 20 mg, Oral, Q breakfast, 20 mg at 11/17/23 1045 **FOLLOWED BY** [START ON 11/20/2023] predniSONE  (DELTASONE ) tablet 10 mg, 10 mg, Oral, Q breakfast, Von Bellis, MD   senna-docusate (Senokot-S) tablet 1 tablet, 1 tablet, Oral, QHS PRN, Cox, Amy N, DO   spironolactone  (ALDACTONE) tablet 25 mg, 25 mg, Oral, Daily, Shavonne Ambroise, MD, 25 mg at 11/17/23 1045   Treprostinil  POWD 16 mcg, 16 mcg, Inhalation, QID, Nada Adriana BIRCH, RPH, 16 mcg at 11/17/23 2331   verapamil  (CALAN -SR) CR tablet 120 mg, 120 mg, Oral, QHS, Ruhi Kopke, MD, 120 mg at 11/17/23 2158   Vitamin D (Ergocalciferol) (DRISDOL) 1.25 MG (50000 UNIT) capsule 50,000 Units, 50,000 Units, Oral, Q7 days, Von Bellis, MD, 50,000 Units at 11/16/23 1315    ALLERGIES   Latex, Lidocaine, Atorvastatin , Losartan , Metronidazole, and Oxycodone      REVIEW OF SYSTEMS    Review of Systems:  Gen:  Denies  fever, sweats, chills weigh loss  HEENT: Denies blurred vision, double vision, ear pain, eye pain, hearing loss, nose bleeds, sore throat Cardiac:  No dizziness, chest pain or heaviness, chest tightness,edema Resp:   reports dyspnea chronically  Gi: Denies swallowing difficulty, stomach pain, nausea or vomiting, diarrhea, constipation, bowel incontinence Gu:  Denies bladder incontinence, burning urine Ext:   Denies Joint pain, stiffness or swelling Skin: Denies  skin rash, easy bruising or bleeding or hives Endoc:  Denies polyuria, polydipsia , polyphagia or weight change Psych:   Denies depression, insomnia or hallucinations   Other:  All other systems negative   VS: BP 139/73   Pulse 78   Temp 98 F (36.7 C) (Oral)   Resp 18   Ht 4' 11.5 (1.511 m)   Wt 59 kg   SpO2 95%   BMI 25.83 kg/m      PHYSICAL EXAM    GENERAL:NAD, no fevers, chills, no weakness no fatigue HEAD: Normocephalic, atraumatic.  EYES: Pupils equal, round, reactive to light. Extraocular muscles intact. No scleral icterus.  MOUTH: Moist mucosal membrane. Dentition intact. No abscess noted.  EAR, NOSE, THROAT: Clear without exudates. No external lesions.  NECK: Supple. No thyromegaly. No nodules. No JVD.  PULMONARY: decreased breath sounds with mild rhonchi worse at bases bilaterally.  CARDIOVASCULAR: S1 and  S2. Regular rate and rhythm. No murmurs, rubs, or gallops. No edema. Pedal pulses 2+ bilaterally.  GASTROINTESTINAL: Soft, nontender, nondistended. No masses. Positive bowel sounds. No hepatosplenomegaly.  MUSCULOSKELETAL: No swelling, clubbing, or edema. Range of motion full in all extremities.  NEUROLOGIC: Cranial nerves II through XII are intact. No gross  focal neurological deficits. Sensation intact. Reflexes intact.  SKIN: No ulceration, lesions, rashes, or cyanosis. Skin warm and dry. Turgor intact.  PSYCHIATRIC: Mood, affect within normal limits. The patient is awake, alert and oriented x 3. Insight, judgment intact.       IMAGING   @IMAGES @   ASSESSMENT/PLAN   Acute on chronic hypoxemic respiratory failure       Currently negative for viral LRTI due to negative RVP and SARS/FLU/RSV panel      - inflammatory workup - are elevated due to IPF exacerbation      - repeat TTE with grade 1 diastolic and moderate RV dysfunction.  Despite this she is fluid overloaded and responds well to diuresis.  Her CT showed interstitial edema overlying chronic pulmonary fibrosis.     Idiopathic pulmonary fibrosis with acute exacerbation    - advanced and progressed over past 5 years    - will continue steroids at current dose with taper   Centrilobular emphysema -no signs of acute exacerbation of COPD  - viral workup and inflammatory biomarkers are negative   - duoneb prn is sufficient at this time   -dc rocephin continue levoquin PO  Pulmonary hypertension associated with pulmonary fibrosis    - will dc tyvasso DPI at home with poor tolerance will switch to [place] going forward  -pre and post capillary per RHC done this year   -PT/OT ordered  -DC midodrine  Bibasilar atelectasis   - recruitement maneuvers via chest physiotherapy utilizing metaneb with duoneb     - PT /OT  Physical deconditioning   Continue PT/OT , continue chest PT  BODE score >8 with poor long term  prognosis   Goals of care     - patient is currently DNR      Thank you for allowing me to participate in the care of this patient.   Patient/Family are satisfied with care plan and all questions have been answered.    Provider disclosure: Patient with at least one acute or chronic illness or injury that poses a threat to life or bodily function and is being managed actively during this encounter.  All of the below services have been performed independently by signing provider:  review of prior documentation from internal and or external health records.  Review of previous and current lab results.  Interview and comprehensive assessment during patient visit today. Review of current and previous chest radiographs/CT scans. Discussion of management and test interpretation with health care team and patient/family.   This document was prepared using Dragon voice recognition software and may include unintentional dictation errors.     Quintasha Gren, M.D.  Division of Pulmonary & Critical Care Medicine

## 2023-11-18 NOTE — Discharge Summary (Signed)
 [facility] Discharge Summary   Patient: Crysten Kaman FMW:[id number]  PCP: Alla Amis, MD  Date of admission: 11/09/2023   Date of discharge:  11/18/2023     Discharge Diagnoses:  Principal Problem:   Acute on chronic respiratory failure (HCC) Active Problems:   Elevated troponin   AKI (acute kidney injury)   Benign essential hypertension   Hypercholesterolemia   Idiopathic pulmonary fibrosis (HCC)   Dyspnea   Sinus tachycardia   Admitted From: Home Disposition:  Home with home services  Recommendations for Outpatient Follow-up:  Follow-up with PCP in 1 week Follow-up with pulmonary in 1 to 2 weeks Follow up LABS/TEST:     Follow-up Information     Launie Elodie Ronal Tinnie, NP. Go in 1 week(s).   Specialty: Nurse Practitioner Why: HAVE PT MAKE APPT TAMELA LAUNIE ELODIE RONAL NP 1 WEEK [phone] Contact information: [address] [place] [address] [phone]                Diet recommendation: Cardiac diet  Activity: The patient is advised to gradually reintroduce usual activities, as tolerated  Discharge Condition: stable  Code Status: DNR-limited  History of present illness: As per the H and P dictated on admission  Hospital Course:   Ms. Kindall Swaby is an 83 year old female with history of pulmonary fibrosis with chronic hypoxemic respiratory failure, GERD,, hypertension, hyperlipidemia, who presented from [facility] clinic pulmonology clinic for chief concerns of acute on chronic respiratory failure requiring increased oxygen supplementation.   Vitals in the ED showed t 97.7, rr 29, hr 140, blood pressure 165/86, SpO2 98% on BiPAP.   Serum sodium is 134, potassium 4.1, chloride 93, bicarb 25, BUN of 20, serum creatinine 1.31, eGFR 40, nonfasting blood glucose 174, WBC 0.8, hemoglobin 12.9, platelets of 259.   Lactic acid is 1.9.  HS troponin is 120.   ED treatment: DuoNebs one-time treatment, Solu-Medrol  125 mg IV  one-time dose.     Assessment and Plan:   # Acute on chronic respiratory failure  At home patient is on 6 L at baseline O2 sat was 50% on 8 L at [facility] clinic. VBG showed mild hypercapnia S/p BiPAP and admitted to stepdown unit S/p Cpap prn as per Pulmo and avoid BiPAP to prevent pneumothorax due to history of underlying pulmonary fibrosis  Chest CT did not show any PE. S/p IV Solu-Medrol , transition to prednisone  tapering dose. S/p Brovana nebulizer twice daily, and DuoNeb every 6 hourly prn, Mucinex 600 mg p.o. twice daily, Tussionex as needed. S/p PPI 40 mg BID for GI prophylaxis Continued immunomodulator , antifibrotic and pulmonary vasodilator therapy for pulmonary fibrosis Pulmonary following.  Discontinued antibiotics and IV Lasix  doses given during hospital stay, transition to oral Lasix  40 mg p.o. daily, continued Aldactone 25 mg.  Repeat. Gradually patient's O2 saturation improved, currently patient is on 8 L oxygen via nasal cannula.  Patient does have oxygen at home.  Cleared by pulmonary to discharge and follow-up as an outpatient.  Patient agreed with the discharge planning.    # AKI (acute kidney injury): Resolved On CKD stage IIIa. BNP 1343 sCr 1.31>>0.91 improved. BNP was elevated so Lasix  was started by pulmonary.  Renal functions remained stable.   # Elevated troponin most likely secondary to demand ischemia due to hypoxemia. Patient denied any chest pain. S/p heparin  IV infusion, discontinued. Troponin remained flat. Echo showed EF 70 to 75% Cardiology consulted and at this time chest pain thought to be noncardiac 10/19 again  had chest pressure, slightly elevated troponin most likely demand ischemia due to hypoxemia. Patient remained chest pain-free at the time of discharge   # Pneumonia: Patient had fever on 10/18 WBC WNL possible due to immunocompromise status  10/19 procalcitonin 1.5, elevated S/p Ceftriaxone x 4 dose and s/p Azithromycin 500 p.o. daily x 3 doses.    10/18 blood cultures NGTD 10/23 Levaquin 500 mg x 1 dose followed by Levaquin 250 mg p.o. daily started by pulmonary.  Patient was discharged on Levaquin to 50 mg p.o. daily for 3 days   # Hypertension with sinus tachycardia due to hypoxia S/p verapamil  180 mg p.o. nightly.  Blood pressure was soft, which improved with midodrine given during hospital stay. Currently blood pressure is stable, patient was discharged on losartan  50 mg p.o. daily, continued Aldactone, started Lasix  as above, midodrine for as needed use if blood pressure drops.  Patient was advised to monitor BP at home and follow with PCP for further management as an outpatient.   # Hypomagnesemia, mag repleted.  Resolved # Hypophosphatemia, Phos repleted.  Resolved # Hypokalemia, potassium repleted.  Resolved   # Vitamin D insufficiency: Vitamin D level 27, started vitamin D 50,000 units p.o. weekly, follow with PCP to repeat vitamin D level after 3 to 6 months.    Body mass index is 25.83 kg/m.  Nutrition Interventions:  Wound 11/15/23 0929 Pressure Injury Nose Medial Stage 2 -  Partial thickness loss of dermis presenting as a shallow open injury with a red, pink wound bed without slough. (Active)    Patient was seen by physical therapy, who recommended Home health, which was arranged. On the day of the discharge the patient's vitals were stable, and no other acute medical condition were reported by patient. the patient was felt safe to be discharge at Home with Home health.  Consultants: Pulmonary Procedures: None  Discharge Exam: General: Appear in no distress, Oral Mucosa Clear, moist. Cardiovascular: S1 and S2 Present, no Murmur, Respiratory: normal respiratory effort, Bilateral Air entry present and mild crackles bilaterally, no wheezes Abdomen: Bowel Sound present, Soft and no tenderness. Extremities: no Pedal edema, no calf tenderness Neurology: alert and oriented to time, place, and person affect  appropriate.  Filed Weights   11/09/23 1327  Weight: 59 kg   Vitals:   11/18/23 1443 11/18/23 1500  BP:  (!) 105/56  Pulse: 97 (!) 101  Resp: (!) 31 (!) 22  Temp:    SpO2: 93% 93%    DISCHARGE MEDICATION: Allergies as of 11/18/2023       Reactions   Latex Itching, Rash   Lidocaine Swelling   Facial swelling from a dentist Facial swelling from a dentist Facial swelling from a dentist   Atorvastatin     Severe hip pain   Losartan  Swelling   Metronidazole Other (See Comments)   Sore joints Sore joints   Oxycodone  Nausea Only        Medication List     STOP taking these medications    cloNIDine  0.1 mg/24hr patch Commonly known as: CATAPRES  - Dosed in mg/24 hr   verapamil  180 MG CR tablet Commonly known as: CALAN -SR       TAKE these medications    acetylcysteine  20 % nebulizer solution Commonly known as: MUCOMYST  Take 4 mLs by nebulization 3 (three) times daily.   albuterol  (2.5 MG/3ML) 0.083% nebulizer solution Commonly known as: PROVENTIL  Take 2.5 mg by nebulization in the morning and at bedtime.   aspirin  81 MG chewable  tablet Chew 81 mg by mouth every evening.   atorvastatin  40 MG tablet Commonly known as: LIPITOR Take 40 mg by mouth daily.   azathioprine 100 MG tablet Commonly known as: IMURAN Take 100 mg by mouth daily.   budesonide-formoterol 160-4.5 MCG/ACT inhaler Commonly known as: SYMBICORT Inhale 2 puffs into the lungs 2 (two) times daily.   cholecalciferol 25 MCG (1000 UNIT) tablet Commonly known as: VITAMIN D3 Take 2 tablets (2,000 Units total) by mouth daily. What changed: how much to take   esomeprazole 40 MG capsule Commonly known as: NEXIUM Take 40 mg by mouth at bedtime.   furosemide  40 MG tablet Commonly known as: Lasix  Take 1 tablet (40 mg total) by mouth daily.   guaiFENesin-dextromethorphan 100-10 MG/5ML syrup Commonly known as: ROBITUSSIN DM Take 10 mLs by mouth every 6 (six) hours as needed for cough.    levofloxacin 250 MG tablet Commonly known as: LEVAQUIN Take 1 tablet (250 mg total) by mouth daily for 3 days. Start taking on: November 19, 2023   losartan  50 MG tablet Commonly known as: COZAAR  Take 0.5 tablets (25 mg total) by mouth daily as needed (systolic BP greater than 140). What changed:  when to take this reasons to take this   midodrine 10 MG tablet Commonly known as: PROAMATINE Take 1 tablet (10 mg total) by mouth 3 (three) times daily as needed (Systolic BP less than <100).   nitroGLYCERIN  0.4 MG SL tablet Commonly known as: NITROSTAT  Place 1 tablet (0.4 mg total) under the tongue every 5 (five) minutes as needed for chest pain.   OXYGEN Inhale 6 L into the lungs continuous.   Pirfenidone  267 MG Tabs Take 534 mg by mouth with breakfast, with lunch, and with evening meal.   potassium chloride SA 20 MEQ tablet Commonly known as: KLOR-CON M Take 1 tablet (20 mEq total) by mouth daily.   predniSONE  10 MG tablet Commonly known as: DELTASONE  Take 2 tablets (20 mg total) by mouth daily with breakfast for 1 day, THEN 1 tablet (10 mg total) daily with breakfast for 3 days, THEN 0.5 tablets (5 mg total) daily with breakfast for 4 days. Start taking on: November 19, 2023   spironolactone 25 MG tablet Commonly known as: ALDACTONE Take 1 tablet (25 mg total) by mouth daily. Start taking on: November 19, 2023   Tyvaso  DPI Maintenance Kit 16 MCG Powd Generic drug: Treprostinil  Inhale 16 mcg into the lungs in the morning, at noon, in the evening, and at bedtime.               Durable Medical Equipment  (From admission, onward)           Start     Ordered   11/18/23 1350  For home use only DME standard manual wheelchair with seat cushion  Once       Comments: Patient suffers from pulmonary fibrosis which impairs their ability to perform daily activities like bathing, dressing, grooming, and toileting in the home.  A walker will not resolve issue with performing  activities of daily living. A wheelchair will allow patient to safely perform daily activities. Patient can safely propel the wheelchair in the home or has a caregiver who can provide assistance. Length of need Lifetime. Accessories: elevating leg rests (ELRs), wheel locks, extensions and anti-tippers.   11/18/23 1349   11/18/23 1335  For home use only DME Walker rolling  Once       Question Answer Comment  Walker: With  5 Inch Wheels   Patient needs a walker to treat with the following condition Age-related physical debility      11/18/23 1334           Allergies  Allergen Reactions   Latex Itching and Rash   Lidocaine Swelling    Facial swelling from a dentist Facial swelling from a dentist Facial swelling from a dentist    Atorvastatin      Severe hip pain   Losartan  Swelling   Metronidazole Other (See Comments)    Sore joints Sore joints    Oxycodone  Nausea Only   Discharge Instructions     Call MD for:  difficulty breathing, headache or visual disturbances   Complete by: As directed    Call MD for:  extreme fatigue   Complete by: As directed    Call MD for:  persistant dizziness or light-headedness   Complete by: As directed    Call MD for:  persistant nausea and vomiting   Complete by: As directed    Call MD for:  severe uncontrolled pain   Complete by: As directed    Call MD for:  temperature >100.4   Complete by: As directed    Diet - low sodium heart healthy   Complete by: As directed    Discharge instructions   Complete by: As directed    Follow-up with PCP in 1 week Follow-up with pulmonary in 1 to 2 weeks   Increase activity slowly   Complete by: As directed    No wound care   Complete by: As directed        The results of significant diagnostics from this hospitalization (including imaging, microbiology, ancillary and laboratory) are listed below for reference.    Significant Diagnostic Studies: DG Chest Port 1 View Result Date:  11/18/2023 EXAM: 1 VIEW(S) XRAY OF THE CHEST 11/18/2023 11:09:00 AM COMPARISON: 11/16/2023 CLINICAL HISTORY: Interstitial edema per ordering notes FINDINGS: LUNGS AND PLEURA: Hypoinflation. Similar coarse bibasilar interstitial opacities. No superimposed acute consolidative airspace disease. Possible small right pleural effusion. No pulmonary edema. No pneumothorax. HEART AND MEDIASTINUM: No acute abnormality of the cardiac and mediastinal silhouettes. BONES AND SOFT TISSUES: No acute osseous abnormality. IMPRESSION: 1. Hypoinflation. 2. Similar coarse bibasilar interstitial opacities compatible with chronic interstitial lung disease. 3. Possible small right pleural effusion. Electronically signed by: Selinda Blue MD 11/18/2023 03:40 PM EDT RP Workstation: [id number]   DG Chest Port 1 View Result Date: 11/16/2023 CLINICAL DATA:  Interstitial edema.  Respiratory distress. EXAM: PORTABLE CHEST 1 VIEW COMPARISON:  11/12/2023, CT 11/11/2023 FINDINGS: Low lung volumes persist. Stable heart size and mediastinal contours. Sequela of interstitial lung disease with subpleural reticulation, greatest at the lung bases. Possibility of superimposed edema is difficult to exclude by radiograph. No change from prior exam. No confluent opacity, pneumothorax or pleural effusion. IMPRESSION: Sequela of interstitial lung disease. Possibility of superimposed edema is difficult to exclude by radiograph. No change from prior exam. Electronically Signed   By: Andrea Gasman M.D.   On: 11/16/2023 10:02   DG Chest Port 1 View Result Date: 11/12/2023 CLINICAL DATA:  Fever. EXAM: PORTABLE CHEST 1 VIEW COMPARISON:  11/11/2023. FINDINGS: The heart size and mediastinal contours are stable. There is atherosclerotic calcification aorta. Mild airspace disease is noted at the lung bases bilaterally. No effusion or pneumothorax is seen. Surgical changes are noted at the shoulders bilaterally. No acute osseous abnormality. IMPRESSION: Stable  mild airspace disease at the lung bases, possibly related to known pulmonary fibrosis  and superimposed edema. Electronically Signed   By: Leita Birmingham M.D.   On: 11/12/2023 17:57   CT Angio Chest Pulmonary Embolism (PE) W or WO Contrast Result Date: 11/11/2023 CLINICAL DATA:  IPF, paroxysmal atrial tachycardia, short of breath, hypoxia EXAM: CT ANGIOGRAPHY CHEST WITH CONTRAST TECHNIQUE: Multidetector CT imaging of the chest was performed using the standard protocol during bolus administration of intravenous contrast. Multiplanar CT image reconstructions and MIPs were obtained to evaluate the vascular anatomy. RADIATION DOSE REDUCTION: This exam was performed according to the departmental dose-optimization program which includes automated exposure control, adjustment of the mA and/or kV according to patient size and/or use of iterative reconstruction technique. CONTRAST:  70mL OMNIPAQUE  IOHEXOL  350 MG/ML SOLN COMPARISON:  10/12/2023, 11/09/2023 FINDINGS: Cardiovascular: This is a technically adequate evaluation of the pulmonary vasculature. No filling defects or pulmonary emboli. Marked dilation of the main pulmonary arteries consistent with pulmonary arterial hypertension, stable. Stable cardiomegaly without pericardial effusion. No evidence of thoracic aortic aneurysm or dissection. Atherosclerosis of the aorta and coronary vasculature. Mediastinum/Nodes: No enlarged mediastinal, hilar, or axillary lymph nodes. Thyroid gland, trachea, and esophagus demonstrate no significant findings. Lungs/Pleura: Stable background emphysema. There is persistent background scarring and fibrosis, slightly more pronounced at the bases, compatible with given history of IPF. Since the recent exam, there is superimposed development of interlobular septal thickening and scattered ground-glass opacities favoring developing pulmonary edema. No effusion or pneumothorax. Hypoventilatory changes at the lung bases. Central airways are  patent. Upper Abdomen: No acute abnormality. Musculoskeletal: No acute or destructive bony abnormalities. Reconstructed images demonstrate no additional findings. Review of the MIP images confirms the above findings. IMPRESSION: 1. No evidence of pulmonary embolus. 2. Continued dilation of the main pulmonary arteries consistent with pulmonary arterial hypertension. 3. Continued background findings compatible with known IPF, with interval development of interlobular septal thickening and scattered ground-glass opacities compatible with superimposed edema. 4. Stable cardiomegaly. 5. Aortic Atherosclerosis (ICD10-I70.0) and Emphysema (ICD10-J43.9). Electronically Signed   By: Ozell Daring M.D.   On: 11/11/2023 16:35   ECHOCARDIOGRAM COMPLETE BUBBLE STUDY Result Date: 11/10/2023    ECHOCARDIOGRAM REPORT   Patient Name:   VERNADINE COOMBS Date of Exam: 11/10/2023 Medical Rec #:  [id number]    Height:       59.5 in Accession #:    [id number]   Weight:       130.1 lb Date of Birth:  [birth year]     BSA:          1.545 m Patient Age:    83 years     BP:           111/62 mmHg Patient Gender: F            HR:           90 bpm. Exam Location:  [facility] Procedure: 2D Echo, Cardiac Doppler, Color Doppler and Saline Contrast Bubble            Study (Both Spectral and Color Flow Doppler were utilized during            procedure). Indications:     CHF-Congestive heart failure, Class I, acute on chronic,                  diastolic.  History:         Patient has prior history of Echocardiogram examinations, most                  recent 10/17/2023. Signs/Symptoms:Murmur; Risk  Factors:Hypertension.  Sonographer:     Christopher Furnace Referring Phys:  [id number] HALINA PICKING Diagnosing Phys: Dwayne D Callwood MD IMPRESSIONS  1. Left ventricular ejection fraction, by estimation, is 70 to 75%. The left ventricle has hyperdynamic function. The left ventricle has no regional wall motion abnormalities. Left ventricular diastolic  parameters are consistent with Grade I diastolic dysfunction (impaired relaxation).  2. Right ventricular systolic function is moderately reduced. The right ventricular size is moderately enlarged. Mildly increased right ventricular wall thickness.  3. The mitral valve is grossly normal. Trivial mitral valve regurgitation.  4. The aortic valve is calcified. Aortic valve regurgitation is not visualized. Aortic valve sclerosis/calcification is present, without any evidence of aortic stenosis. FINDINGS  Left Ventricle: Left ventricular ejection fraction, by estimation, is 70 to 75%. The left ventricle has hyperdynamic function. The left ventricle has no regional wall motion abnormalities. Strain was performed and the global longitudinal strain is indeterminate. The left ventricular internal cavity size was normal in size. There is no left ventricular hypertrophy. Left ventricular diastolic parameters are consistent with Grade I diastolic dysfunction (impaired relaxation). Right Ventricle: The right ventricular size is moderately enlarged. Mildly increased right ventricular wall thickness. Right ventricular systolic function is moderately reduced. Left Atrium: Left atrial size was normal in size. Right Atrium: Right atrial size was normal in size. Pericardium: There is no evidence of pericardial effusion. Mitral Valve: The mitral valve is grossly normal. There is mild calcification of the mitral valve leaflet(s). Trivial mitral valve regurgitation. Tricuspid Valve: The tricuspid valve is normal in structure. Tricuspid valve regurgitation is not demonstrated. Aortic Valve: The aortic valve is calcified. Aortic valve regurgitation is not visualized. Aortic valve sclerosis/calcification is present, without any evidence of aortic stenosis. Aortic valve mean gradient measures 3.5 mmHg. Aortic valve peak gradient measures 5.9 mmHg. Aortic valve area, by VTI measures 2.10 cm. Pulmonic Valve: The pulmonic valve was normal in  structure. Pulmonic valve regurgitation is not visualized. Aorta: The ascending aorta was not well visualized. IAS/Shunts: No atrial level shunt detected by color flow Doppler. Agitated saline contrast was given intravenously to evaluate for intracardiac shunting. Additional Comments: 3D was performed not requiring image post processing on an independent workstation and was indeterminate.  LEFT VENTRICLE PLAX 2D LVIDd:         3.30 cm   Diastology LVIDs:         1.90 cm   LV e' medial:    5.11 cm/s LV PW:         0.90 cm   LV E/e' medial:  16.0 LV IVS:        1.10 cm   LV e' lateral:   5.22 cm/s LVOT diam:     2.00 cm   LV E/e' lateral: 15.7 LV SV:         45 LV SV Index:   29 LVOT Area:     3.14 cm  RIGHT VENTRICLE RV Basal diam:  4.10 cm RV Mid diam:    3.70 cm RV S prime:     15.00 cm/s TAPSE (M-mode): 2.5 cm LEFT ATRIUM           Index        RIGHT ATRIUM           Index LA diam:      3.30 cm 2.14 cm/m   RA Area:     10.40 cm LA Vol (A4C): 26.3 ml 17.02 ml/m  RA Volume:   22.30 ml  14.43 ml/m  AORTIC VALVE AV Area (Vmax):    1.99 cm AV Area (Vmean):   2.09 cm AV Area (VTI):     2.10 cm AV Vmax:           121.00 cm/s AV Vmean:          84.700 cm/s AV VTI:            0.214 m AV Peak Grad:      5.9 mmHg AV Mean Grad:      3.5 mmHg LVOT Vmax:         76.60 cm/s LVOT Vmean:        56.400 cm/s LVOT VTI:          0.143 m LVOT/AV VTI ratio: 0.67  AORTA Ao Root diam: 2.50 cm MITRAL VALVE                TRICUSPID VALVE MV Area (PHT): 6.96 cm     TR Peak grad:   37.5 mmHg MV Decel Time: 109 msec     TR Vmax:        306.00 cm/s MV E velocity: 81.80 cm/s MV A velocity: 120.00 cm/s  SHUNTS MV E/A ratio:  0.68         Systemic VTI:  0.14 m                             Systemic Diam: 2.00 cm Cara JONETTA Lovelace MD Electronically signed by Cara JONETTA Lovelace MD Signature Date/Time: 11/10/2023/5:31:35 PM    Final    DG Chest Port 1 View Result Date: 11/09/2023 EXAM: 1 VIEW XRAY OF THE CHEST 11/09/2023 02:13:46 PM  COMPARISON: 10/14/2023. CLINICAL HISTORY: SOB, respiratory distress. FINDINGS: LUNGS AND PLEURA: Low lung volumes. Diffuse interstitial prominence, with basilar predominance, unchanged. Peripheral scarring in the right upper lobe unchanged. Emphysema noted. No pleural effusion. No pneumothorax. HEART AND MEDIASTINUM: Atherosclerotic descending thoracic aorta. Mild cardiomegaly. BONES AND SOFT TISSUES: No acute osseous abnormality. IMPRESSION: 1. Diffuse interstitial prominence with basilar predominance, unchanged. 2. Emphysema. 3. Mild cardiomegaly. 4. Peripheral scarring in the right upper lobe, unchanged. 5. Low lung volumes. 6. Atherosclerotic descending thoracic aorta. Electronically signed by: Ryan Salvage MD 11/09/2023 02:37 PM EDT RP Workstation: [id number]    Microbiology: Recent Results (from the past 240 hours)  Blood culture (routine x 2)     Status: None   Collection Time: 11/09/23  1:43 PM   Specimen: BLOOD  Result Value Ref Range Status   Specimen Description BLOOD RIGHT ANTECUBITAL  Final   Special Requests   Final    BOTTLES DRAWN AEROBIC AND ANAEROBIC Blood Culture adequate volume   Culture   Final    NO GROWTH 5 DAYS Performed at [facility], [address] [place], [place] [address]    Report Status 11/14/2023 FINAL  Final  Blood culture (routine x 2)     Status: None   Collection Time: 11/09/23  1:43 PM   Specimen: BLOOD  Result Value Ref Range Status   Specimen Description BLOOD BLOOD LEFT ARM  Final   Special Requests   Final    BOTTLES DRAWN AEROBIC AND ANAEROBIC Blood Culture results may not be optimal due to an inadequate volume of blood received in culture bottles   Culture   Final    NO GROWTH 5 DAYS Performed at [facility], [address], [place], [place] [address]    Report Status 11/14/2023 FINAL  Final  MRSA Next Gen by PCR, Nasal     Status: None   Collection Time: 11/09/23  9:20 PM   Specimen: Nasal Mucosa; Nasal Swab   Result Value Ref Range Status   MRSA by PCR Next Gen NOT DETECTED NOT DETECTED Final    Comment: (NOTE) The GeneXpert MRSA Assay (FDA approved for NASAL specimens only), is one component of a comprehensive MRSA colonization surveillance program. It is not intended to diagnose MRSA infection nor to guide or monitor treatment for MRSA infections. Test performance is not FDA approved in patients less than 53 years old. Performed at [facility], [address] [place], [place] [address]   Resp panel by RT-PCR (RSV, Flu A&B, Covid) Anterior Nasal Swab     Status: None   Collection Time: 11/10/23  5:42 AM   Specimen: Anterior Nasal Swab  Result Value Ref Range Status   SARS Coronavirus 2 by RT PCR NEGATIVE NEGATIVE Final    Comment: (NOTE) SARS-CoV-2 target nucleic acids are NOT DETECTED.  The SARS-CoV-2 RNA is generally detectable in upper respiratory specimens during the acute phase of infection. The lowest concentration of SARS-CoV-2 viral copies this assay can detect is 138 copies/mL. A negative result does not preclude SARS-Cov-2 infection and should not be used as the sole basis for treatment or other patient management decisions. A negative result may occur with  improper specimen collection/handling, submission of specimen other than nasopharyngeal swab, presence of viral mutation(s) within the areas targeted by this assay, and inadequate number of viral copies(<138 copies/mL). A negative result must be combined with clinical observations, patient history, and epidemiological information. The expected result is Negative.  Fact Sheet for Patients:  bloggercourse.com  Fact Sheet for Healthcare Providers:  seriousbroker.it  This test is no t yet approved or cleared by the [place]  FDA and  has been authorized for detection and/or diagnosis of SARS-CoV-2 by FDA under an Emergency Use Authorization (EUA). This  EUA will remain  in effect (meaning this test can be used) for the duration of the COVID-19 declaration under Section 564(b)(1) of the Act, 21 U.S.C.section 360bbb-3(b)(1), unless the authorization is terminated  or revoked sooner.       Influenza A by PCR NEGATIVE NEGATIVE Final   Influenza B by PCR NEGATIVE NEGATIVE Final    Comment: (NOTE) The Xpert Xpress SARS-CoV-2/FLU/RSV plus assay is intended as an aid in the diagnosis of influenza from Nasopharyngeal swab specimens and should not be used as a sole basis for treatment. Nasal washings and aspirates are unacceptable for Xpert Xpress SARS-CoV-2/FLU/RSV testing.  Fact Sheet for Patients: bloggercourse.com  Fact Sheet for Healthcare Providers: seriousbroker.it  This test is not yet approved or cleared by the [place]  FDA and has been authorized for detection and/or diagnosis of SARS-CoV-2 by FDA under an Emergency Use Authorization (EUA). This EUA will remain in effect (meaning this test can be used) for the duration of the COVID-19 declaration under Section 564(b)(1) of the Act, 21 U.S.C. section 360bbb-3(b)(1), unless the authorization is terminated or revoked.     Resp Syncytial Virus by PCR NEGATIVE NEGATIVE Final    Comment: (NOTE) Fact Sheet for Patients: bloggercourse.com  Fact Sheet for Healthcare Providers: seriousbroker.it  This test is not yet approved or cleared by the [place]  FDA and has been authorized for detection and/or diagnosis of SARS-CoV-2 by FDA under an Emergency Use Authorization (EUA). This EUA will remain in effect (meaning this test can be used) for the duration  of the COVID-19 declaration under Section 564(b)(1) of the Act, 21 U.S.C. section 360bbb-3(b)(1), unless the authorization is terminated or revoked.  Performed at [facility], [address] [place], [place]  [address]   Respiratory (~20 pathogens) panel by PCR     Status: None   Collection Time: 11/10/23  3:37 PM   Specimen: Nasopharyngeal Swab; Respiratory  Result Value Ref Range Status   Adenovirus NOT DETECTED NOT DETECTED Final   Coronavirus 229E NOT DETECTED NOT DETECTED Final    Comment: (NOTE) The Coronavirus on the Respiratory Panel, DOES NOT test for the novel  Coronavirus (2019 nCoV)    Coronavirus HKU1 NOT DETECTED NOT DETECTED Final   Coronavirus NL63 NOT DETECTED NOT DETECTED Final   Coronavirus OC43 NOT DETECTED NOT DETECTED Final   Metapneumovirus NOT DETECTED NOT DETECTED Final   Rhinovirus / Enterovirus NOT DETECTED NOT DETECTED Final   Influenza A NOT DETECTED NOT DETECTED Final   Influenza B NOT DETECTED NOT DETECTED Final   Parainfluenza Virus 1 NOT DETECTED NOT DETECTED Final   Parainfluenza Virus 2 NOT DETECTED NOT DETECTED Final   Parainfluenza Virus 3 NOT DETECTED NOT DETECTED Final   Parainfluenza Virus 4 NOT DETECTED NOT DETECTED Final   Respiratory Syncytial Virus NOT DETECTED NOT DETECTED Final   Bordetella pertussis NOT DETECTED NOT DETECTED Final   Bordetella Parapertussis NOT DETECTED NOT DETECTED Final   Chlamydophila pneumoniae NOT DETECTED NOT DETECTED Final   Mycoplasma pneumoniae NOT DETECTED NOT DETECTED Final    Comment: Performed at [facility], [address] [place], [place] [address]  Culture, blood (Routine X 2) w Reflex to ID Panel     Status: None   Collection Time: 11/12/23  6:08 PM   Specimen: BLOOD  Result Value Ref Range Status   Specimen Description BLOOD BLOOD LEFT ARM  Final   Special Requests   Final    BOTTLES DRAWN AEROBIC AND ANAEROBIC Blood Culture adequate volume   Culture   Final    NO GROWTH 5 DAYS Performed at [facility], [address] [place], [place] [address]    Report Status 11/17/2023 FINAL  Final  Culture, blood (Routine X 2) w Reflex to ID Panel     Status: None   Collection Time: 11/12/23   6:08 PM   Specimen: BLOOD  Result Value Ref Range Status   Specimen Description BLOOD BLOOD RIGHT HAND  Final   Special Requests   Final    BOTTLES DRAWN AEROBIC AND ANAEROBIC Blood Culture adequate volume   Culture   Final    NO GROWTH 5 DAYS Performed at [facility], [address] [place], [place] [address]    Report Status 11/17/2023 FINAL  Final     Labs: CBC: Recent Labs  Lab 11/14/23 0434 11/15/23 0339 11/16/23 0323 11/17/23 0449 11/18/23 0428  WBC 9.3 9.0 8.7 12.3* 13.1*  HGB 12.9 13.2 12.0 11.7* 11.9*  HCT 38.2 39.2 35.6* 34.2* 36.0  MCV 89.9 88.9 88.6 87.5 89.3  PLT 303 327 329 363 416*   Basic Metabolic Panel: Recent Labs  Lab 11/14/23 0434 11/15/23 0339 11/16/23 0323 11/17/23 0449 11/18/23 0428  NA 135 133* 134* 135 133*  K 4.0 3.3* 3.9 3.6 4.0  CL 92* 91* 93* 94* 93*  CO2 28 26 31 30 29   GLUCOSE 115* 103* 116* 119* 111*  BUN 25* 25* 25* 20 19  CREATININE 0.80 0.88 0.77 0.93 0.91  CALCIUM  8.3* 8.3* 8.3* 8.4*  8.5*  MG 2.1 1.8 1.9 1.6* 2.1  PHOS 2.2* 2.8 3.1 3.2 3.8   Liver Function Tests: No results for input(s): AST, ALT, ALKPHOS, BILITOT, PROT, ALBUMIN in the last 168 hours. No results for input(s): LIPASE, AMYLASE in the last 168 hours. No results for input(s): AMMONIA in the last 168 hours. Cardiac Enzymes: No results for input(s): CKTOTAL, CKMB, CKMBINDEX, TROPONINI in the last 168 hours. BNP (last 3 results) Recent Labs    10/14/23 1646 11/09/23 2138  BNP 317.7* 1,343.7*   CBG: No results for input(s): GLUCAP in the last 168 hours.  Time spent: 35 minutes  Signed:  Elvan Sor  [facility] 11/18/2023 4:22 PM

## 2023-11-18 NOTE — TOC Progression Note (Signed)
 Transition of Care [facility]) - Progression Note    Patient Details  Name: Susan Sosa MRN: [id number] Date of Birth: 06/30/1940  [facility]) CM/SW Contact  K'La JINNY Ruts, LCSW Phone Number: 11/18/2023, 4:00 PM  Clinical Narrative:    Chart reviewed. [facility] and updated her on the patient D/C today. Brandi verbalized understanding.      Barriers to Discharge: Continued Medical Work up               Expected Discharge Plan and Services       Living arrangements for the past 2 months: Single Family Home                                       Social Drivers of Health (SDOH) Interventions SDOH Screenings   Food Insecurity: No Food Insecurity (11/09/2023)  Housing: Low Risk  (11/09/2023)  Transportation Needs: No Transportation Needs (11/09/2023)  Utilities: Not At Risk (11/09/2023)  Depression (PHQ2-9): Medium Risk (12/23/2020)  Financial Resource Strain: Low Risk  (03/30/2023)   Received from [facility]  Social Connections: Socially Integrated (11/09/2023)  Tobacco Use: Medium Risk (11/09/2023)   Received from [facility]    Readmission Risk Interventions    11/11/2023   10:49 AM  Readmission Risk Prevention Plan  Transportation Screening Complete  PCP or Specialist Appt within 3-5 Days Complete  Social Work Consult for Recovery Care Planning/Counseling Complete  Palliative Care Screening Not Applicable  Medication Review [employer]) Complete

## 2023-11-18 NOTE — Plan of Care (Signed)
  Problem: Education: Goal: Knowledge of General Education information will improve Description: Including pain rating scale, medication(s)/side effects and non-pharmacologic comfort measures Outcome: Progressing   Problem: Health Behavior/Discharge Planning: Goal: Ability to manage health-related needs will improve Outcome: Progressing   Problem: Clinical Measurements: Goal: Ability to maintain clinical measurements within normal limits will improve Outcome: Progressing Goal: Will remain free from infection Outcome: Progressing Goal: Diagnostic test results will improve Outcome: Progressing Goal: Respiratory complications will improve Outcome: Progressing Goal: Cardiovascular complication will be avoided Outcome: Progressing   Problem: Activity: Goal: Risk for activity intolerance will decrease Outcome: Progressing   Problem: Nutrition: Goal: Adequate nutrition will be maintained Outcome: Progressing   Problem: Coping: Goal: Level of anxiety will decrease Outcome: Progressing   Problem: Elimination: Goal: Will not experience complications related to urinary retention Outcome: Progressing   Problem: Pain Managment: Goal: General experience of comfort will improve and/or be controlled Outcome: Progressing

## 2023-11-18 NOTE — Progress Notes (Signed)
 Patient d/c home on 6L oxygen. Home w/c and walker delivered. Home meds returned. Patient wheeled to car by [employer]. Patient home oxygen in car. Discharge instructions provided - patient and husband verbalize understanding. No further needs. All questions answered.

## 2023-11-19 ENCOUNTER — Other Ambulatory Visit: Payer: Self-pay

## 2023-11-28 ENCOUNTER — Other Ambulatory Visit: Payer: Self-pay | Admitting: Nurse Practitioner

## 2023-11-28 DIAGNOSIS — M79604 Pain in right leg: Secondary | ICD-10-CM

## 2023-11-28 DIAGNOSIS — R600 Localized edema: Secondary | ICD-10-CM

## 2023-11-29 ENCOUNTER — Other Ambulatory Visit

## 2023-11-30 ENCOUNTER — Ambulatory Visit: Attending: Nurse Practitioner

## 2023-12-16 ENCOUNTER — Other Ambulatory Visit: Payer: Self-pay

## 2024-01-03 ENCOUNTER — Emergency Department

## 2024-01-03 ENCOUNTER — Observation Stay
Admission: EM | Admit: 2024-01-03 | Discharge: 2024-01-07 | DRG: 291 | Disposition: A | Attending: Student | Admitting: Student

## 2024-01-03 DIAGNOSIS — I2510 Atherosclerotic heart disease of native coronary artery without angina pectoris: Secondary | ICD-10-CM

## 2024-01-03 DIAGNOSIS — I5023 Acute on chronic systolic (congestive) heart failure: Secondary | ICD-10-CM

## 2024-01-03 DIAGNOSIS — J9621 Acute and chronic respiratory failure with hypoxia: Principal | ICD-10-CM | POA: Diagnosis present

## 2024-01-03 DIAGNOSIS — J84112 Idiopathic pulmonary fibrosis: Secondary | ICD-10-CM | POA: Diagnosis present

## 2024-01-03 DIAGNOSIS — I4719 Other supraventricular tachycardia: Secondary | ICD-10-CM | POA: Diagnosis present

## 2024-01-03 DIAGNOSIS — E871 Hypo-osmolality and hyponatremia: Secondary | ICD-10-CM | POA: Diagnosis present

## 2024-01-03 DIAGNOSIS — I272 Pulmonary hypertension, unspecified: Secondary | ICD-10-CM

## 2024-01-03 DIAGNOSIS — J810 Acute pulmonary edema: Secondary | ICD-10-CM

## 2024-01-03 DIAGNOSIS — I952 Hypotension due to drugs: Secondary | ICD-10-CM

## 2024-01-03 DIAGNOSIS — I428 Other cardiomyopathies: Secondary | ICD-10-CM

## 2024-01-03 LAB — RESP PANEL BY RT-PCR (RSV, FLU A&B, COVID)  RVPGX2
Influenza A by PCR: NEGATIVE
Influenza B by PCR: NEGATIVE
Resp Syncytial Virus by PCR: NEGATIVE
SARS Coronavirus 2 by RT PCR: NEGATIVE

## 2024-01-03 LAB — CBC WITH DIFFERENTIAL/PLATELET
Abs Immature Granulocytes: 0.09 K/uL — ABNORMAL HIGH (ref 0.00–0.07)
Basophils Absolute: 0.1 K/uL (ref 0.0–0.1)
Basophils Relative: 1 %
Eosinophils Absolute: 0 K/uL (ref 0.0–0.5)
Eosinophils Relative: 0 %
HCT: 34.9 % — ABNORMAL LOW (ref 36.0–46.0)
Hemoglobin: 11.6 g/dL — ABNORMAL LOW (ref 12.0–15.0)
Immature Granulocytes: 1 %
Lymphocytes Relative: 5 %
Lymphs Abs: 0.4 K/uL — ABNORMAL LOW (ref 0.7–4.0)
MCH: 30.9 pg (ref 26.0–34.0)
MCHC: 33.2 g/dL (ref 30.0–36.0)
MCV: 93.1 fL (ref 80.0–100.0)
Monocytes Absolute: 0.3 K/uL (ref 0.1–1.0)
Monocytes Relative: 3 %
Neutro Abs: 8.4 K/uL — ABNORMAL HIGH (ref 1.7–7.7)
Neutrophils Relative %: 90 %
Platelets: 340 K/uL (ref 150–400)
RBC: 3.75 MIL/uL — ABNORMAL LOW (ref 3.87–5.11)
RDW: 15.6 % — ABNORMAL HIGH (ref 11.5–15.5)
WBC: 9.2 K/uL (ref 4.0–10.5)
nRBC: 0 % (ref 0.0–0.2)

## 2024-01-03 LAB — COMPREHENSIVE METABOLIC PANEL WITH GFR
ALT: 11 U/L (ref 0–44)
AST: 20 U/L (ref 15–41)
Albumin: 3.8 g/dL (ref 3.5–5.0)
Alkaline Phosphatase: 112 U/L (ref 38–126)
Anion gap: 13 (ref 5–15)
BUN: 14 mg/dL (ref 8–23)
CO2: 25 mmol/L (ref 22–32)
Calcium: 8.8 mg/dL — ABNORMAL LOW (ref 8.9–10.3)
Chloride: 95 mmol/L — ABNORMAL LOW (ref 98–111)
Creatinine, Ser: 0.91 mg/dL (ref 0.44–1.00)
GFR, Estimated: >60 mL/min (ref >60)
Glucose, Bld: 171 mg/dL — ABNORMAL HIGH (ref 70–99)
Potassium: 4.9 mmol/L (ref 3.5–5.1)
Sodium: 134 mmol/L — ABNORMAL LOW (ref 135–145)
Total Bilirubin: 0.5 mg/dL (ref 0.0–1.2)
Total Protein: 6.6 g/dL (ref 6.5–8.1)

## 2024-01-03 LAB — TROPONIN T, HIGH SENSITIVITY
Troponin T High Sensitivity: 38 ng/L — ABNORMAL HIGH (ref 0–19)
Troponin T High Sensitivity: 37 ng/L — ABNORMAL HIGH (ref 0–19)

## 2024-01-03 LAB — BLOOD GAS, VENOUS
Acid-Base Excess: 3 mmol/L — ABNORMAL HIGH (ref 0.0–2.0)
Bicarbonate: 28.5 mmol/L — ABNORMAL HIGH (ref 20.0–28.0)
O2 Saturation: 54.7 %
Patient temperature: 37
pCO2, Ven: 46 mmHg (ref 44–60)
pH, Ven: 7.4 (ref 7.25–7.43)
pO2, Ven: 36 mmHg (ref 32–45)

## 2024-01-03 LAB — PRO BRAIN NATRIURETIC PEPTIDE: Pro Brain Natriuretic Peptide: 8113 pg/mL — ABNORMAL HIGH (ref <300.0)

## 2024-01-03 MED ORDER — IOHEXOL 350 MG/ML SOLN
75.0000 mL | Freq: Once | INTRAVENOUS | Status: AC | PRN
  Administered 2024-01-03: 75 mL via INTRAVENOUS

## 2024-01-03 MED ORDER — FUROSEMIDE 10 MG/ML IJ SOLN
40.0000 mg | Freq: Two times a day (BID) | INTRAMUSCULAR | Status: DC
  Administered 2024-01-03 – 2024-01-06 (×6): 40 mg via INTRAVENOUS
  Filled 2024-01-03 (×6): qty 4

## 2024-01-03 MED ORDER — POTASSIUM CHLORIDE CRYS ER 20 MEQ PO TBCR
20.0000 meq | EXTENDED_RELEASE_TABLET | Freq: Every day | ORAL | Status: DC
  Administered 2024-01-04 – 2024-01-07 (×4): 20 meq via ORAL
  Filled 2024-01-03 (×4): qty 1

## 2024-01-03 MED ORDER — ACETAMINOPHEN 650 MG RE SUPP: 650.0000 mg | Freq: Four times a day (QID) | RECTAL | Status: DC | PRN

## 2024-01-03 MED ORDER — ACETAMINOPHEN 325 MG PO TABS: 650.0000 mg | ORAL_TABLET | Freq: Four times a day (QID) | ORAL | Status: DC | PRN

## 2024-01-03 MED ORDER — GUAIFENESIN ER 600 MG PO TB12
600.0000 mg | ORAL_TABLET | Freq: Two times a day (BID) | ORAL | Status: DC
  Administered 2024-01-03 – 2024-01-07 (×8): 600 mg via ORAL
  Filled 2024-01-03 (×8): qty 1

## 2024-01-03 MED ORDER — ONDANSETRON HCL 4 MG/2ML IJ SOLN
4.0000 mg | Freq: Four times a day (QID) | INTRAMUSCULAR | Status: DC | PRN
  Administered 2024-01-03 – 2024-01-07 (×7): 4 mg via INTRAVENOUS
  Filled 2024-01-03 (×7): qty 2

## 2024-01-03 MED ORDER — AZATHIOPRINE 50 MG PO TABS
100.0000 mg | ORAL_TABLET | Freq: Every day | ORAL | Status: AC
  Administered 2024-01-04 – 2024-01-07 (×4): 100 mg via ORAL
  Filled 2024-01-03 (×5): qty 2

## 2024-01-03 MED ORDER — SPIRONOLACTONE 25 MG PO TABS
25.0000 mg | ORAL_TABLET | Freq: Every day | ORAL | Status: DC
  Administered 2024-01-04 – 2024-01-07 (×4): 25 mg via ORAL
  Filled 2024-01-03 (×4): qty 1

## 2024-01-03 MED ORDER — FLUTICASONE FUROATE-VILANTEROL 100-25 MCG/ACT IN AEPB
1.0000 | INHALATION_SPRAY | Freq: Every day | RESPIRATORY_TRACT | Status: DC
  Administered 2024-01-04 – 2024-01-07 (×4): 1 via RESPIRATORY_TRACT
  Filled 2024-01-03 (×3): qty 28

## 2024-01-03 MED ORDER — TREPROSTINIL 16 MCG IN POWD: 16.0000 ug | Freq: Four times a day (QID) | RESPIRATORY_TRACT | Status: DC

## 2024-01-03 MED ORDER — ENOXAPARIN SODIUM 40 MG/0.4ML IJ SOSY
40.0000 mg | PREFILLED_SYRINGE | Freq: Every day | INTRAMUSCULAR | Status: DC
  Administered 2024-01-03 – 2024-01-06 (×4): 40 mg via SUBCUTANEOUS
  Filled 2024-01-03 (×4): qty 0.4

## 2024-01-03 MED ORDER — IPRATROPIUM-ALBUTEROL 0.5-2.5 (3) MG/3ML IN SOLN
6.0000 mL | Freq: Once | RESPIRATORY_TRACT | Status: AC
  Administered 2024-01-03: 6 mL via RESPIRATORY_TRACT
  Filled 2024-01-03: qty 6

## 2024-01-03 MED ORDER — DEXTROMETHORPHAN-GUAIFENESIN 20-200 MG/20ML PO LIQD: 10.0000 mL | Freq: Four times a day (QID) | ORAL | Status: DC | PRN

## 2024-01-03 MED ORDER — VITAMIN D 25 MCG (1000 UNIT) PO TABS
2000.0000 [IU] | ORAL_TABLET | Freq: Every day | ORAL | Status: DC
  Administered 2024-01-04 – 2024-01-07 (×4): 2000 [IU] via ORAL
  Filled 2024-01-03 (×4): qty 2

## 2024-01-03 MED ORDER — ALBUTEROL SULFATE (2.5 MG/3ML) 0.083% IN NEBU: 2.5000 mg | INHALATION_SOLUTION | RESPIRATORY_TRACT | Status: DC | PRN

## 2024-01-03 MED ORDER — ASPIRIN 81 MG PO CHEW
81.0000 mg | CHEWABLE_TABLET | Freq: Every evening | ORAL | Status: DC
  Administered 2024-01-03 – 2024-01-06 (×4): 81 mg via ORAL
  Filled 2024-01-03 (×4): qty 1

## 2024-01-03 MED ORDER — LOSARTAN POTASSIUM 50 MG PO TABS: 25.0000 mg | ORAL_TABLET | Freq: Every day | ORAL | Status: DC | PRN

## 2024-01-03 MED ORDER — DEXAMETHASONE 0.5 MG PO TABS
1.0000 mg | ORAL_TABLET | Freq: Two times a day (BID) | ORAL | Status: DC
  Administered 2024-01-03 – 2024-01-05 (×4): 1 mg via ORAL
  Filled 2024-01-03 (×4): qty 2

## 2024-01-03 MED ORDER — PROMETHAZINE (PHENERGAN) 6.25MG IN NS 50ML IVPB
6.2500 mg | Freq: Four times a day (QID) | INTRAVENOUS | Status: DC | PRN
  Filled 2024-01-03: qty 50

## 2024-01-03 MED ORDER — NITROGLYCERIN 0.4 MG SL SUBL: 0.4000 mg | SUBLINGUAL_TABLET | SUBLINGUAL | Status: DC | PRN

## 2024-01-03 MED ORDER — IPRATROPIUM-ALBUTEROL 0.5-2.5 (3) MG/3ML IN SOLN
3.0000 mL | Freq: Four times a day (QID) | RESPIRATORY_TRACT | Status: DC
  Administered 2024-01-03 – 2024-01-04 (×4): 3 mL via RESPIRATORY_TRACT
  Filled 2024-01-03 (×4): qty 3

## 2024-01-03 MED ORDER — ONDANSETRON HCL 4 MG PO TABS: 4.0000 mg | ORAL_TABLET | Freq: Four times a day (QID) | ORAL | Status: DC | PRN

## 2024-01-03 MED ORDER — GUAIFENESIN-DM 100-10 MG/5ML PO SYRP: 10.0000 mL | ORAL_SOLUTION | Freq: Four times a day (QID) | ORAL | Status: DC | PRN

## 2024-01-03 MED ORDER — ATORVASTATIN CALCIUM 20 MG PO TABS
40.0000 mg | ORAL_TABLET | Freq: Every day | ORAL | Status: DC
  Administered 2024-01-03 – 2024-01-07 (×5): 40 mg via ORAL
  Filled 2024-01-03 (×5): qty 2

## 2024-01-03 MED ORDER — MIDODRINE HCL 5 MG PO TABS: 10.0000 mg | ORAL_TABLET | Freq: Three times a day (TID) | ORAL | Status: DC | PRN

## 2024-01-03 MED ORDER — ONDANSETRON HCL 4 MG/2ML IJ SOLN
4.0000 mg | Freq: Once | INTRAMUSCULAR | Status: AC
  Administered 2024-01-03: 4 mg via INTRAVENOUS
  Filled 2024-01-03: qty 2

## 2024-01-03 MED ORDER — FUROSEMIDE 10 MG/ML IJ SOLN
40.0000 mg | Freq: Once | INTRAMUSCULAR | Status: AC
  Administered 2024-01-03: 40 mg via INTRAVENOUS
  Filled 2024-01-03: qty 4

## 2024-01-03 MED ORDER — PIRFENIDONE 267 MG PO TABS: 534.0000 mg | ORAL_TABLET | Freq: Three times a day (TID) | ORAL | Status: DC

## 2024-01-03 MED ORDER — HYDROCODONE-ACETAMINOPHEN 5-325 MG PO TABS: 1.0000 | ORAL_TABLET | ORAL | Status: DC | PRN

## 2024-01-03 NOTE — Assessment & Plan Note (Signed)
 No complaints of chest pain, EKG nonacute Continue aspirin , atorvastatin , losartan , nitroglycerin 

## 2024-01-03 NOTE — Assessment & Plan Note (Addendum)
 On salt tablets Will hold NaCl in the setting of acute CHF exacerbation Monitor serum sodium

## 2024-01-03 NOTE — ED Provider Notes (Signed)
 6:55 PM Assumed care for off going team.   Blood pressure 133/67, pulse (!) 118, temperature 97.7 F (36.5 C), temperature source Oral, resp. rate 20, height 4' 11.5 (1.511 m), weight 57.2 kg, SpO2 100%.  See their HPI for full report but in brief pending ct and admit    IMPRESSION:  1. No evidence of pulmonary embolus.  2. Findings consistent with congestive heart failure and mild  pulmonary edema, superimposed upon background emphysema and  fibrosis.  3. Stable cardiomegaly, with dilated pulmonary arteries consistent  with pulmonary arterial hypertension.  4. Aortic Atherosclerosis (ICD10-I70.0) and Emphysema (ICD10-J43.9).    Will d/w hospital for admission. Lasix  was already ordered.    Ernest Ronal BRAVO, MD 01/03/24 [phone]

## 2024-01-03 NOTE — Assessment & Plan Note (Addendum)
 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 

## 2024-01-03 NOTE — Hospital Course (Signed)
 SABRA

## 2024-01-03 NOTE — Assessment & Plan Note (Signed)
No acute issues suspected at this time

## 2024-01-03 NOTE — Assessment & Plan Note (Addendum)
 Severe pulmonary hypertension, possible acute cor pulmonale Nonischemic cardiomyopathy Acute on chronic respiratory failure with hypoxia Patient presents with acute onset shortness of breath with increased O2 requirement in spite of outpatient intensification of diuretic regimen Clinically fluid overloaded on exam, proBNP 8113 and CTA chest showing pulmonary edema IV Lasix  Continue GDMT with spironolactone , losartan  Holding salt tablet for salt restriction History of HFrEF with improved EF (35% 2023-> 55% 09/2023)  Daily weights and intake and output monitoring

## 2024-01-03 NOTE — ED Notes (Signed)
 Pt states that lasix  makes her nauseous and vomit. Cleatus MD made aware. PRN Zofran  is q6 but last administration was at 1735. Order put in by Cleatus MD for phenergan .

## 2024-01-03 NOTE — Assessment & Plan Note (Signed)
 Continue midodrine  as needed SBP under 100

## 2024-01-03 NOTE — Assessment & Plan Note (Signed)
 Hypotension secondary to GDMT, on as needed midodrine  Continue home BP lowering GDMT Continue midodrine  for SBP less than 100

## 2024-01-03 NOTE — ED Provider Notes (Signed)
 [facility] Acute Care Provider Note    Event Date/Time   First MD Initiated Contact with Patient 01/03/24 1336     (approximate)   History   Shortness of Breath  Pt presents to the ED via POV from home with [facility] that started this AM. Pt wears 8L via [place] at home. Pt states that her O2 saturation has been reading in the 70's. Pt wear 6L via  upon arrival. O2 saturation 73%. Pt A&Ox4. Denies pain, fevers, chills, or cough.   HPI Susan Sosa is a 83 y.o. female PMH multiple medical comorbidities including idiopathic pulmonary fibrosis with chronic respiratory failure on 8 L nasal cannula at home, CAD, nonischemic cardiomyopathy, hypertension, hyperlipidemia presents for evaluation of shortness of breath -Coughing this morning and has had increasing difficulty breathing -Oxygen saturations usually in the 90s on 8 L nasal cannula at home, today they were in the 70s -Does endorse some mild chest tightness, no frank pain -Mild bilateral leg swelling is chronic, not acutely worse recently -No fever -No known sick contacts     Physical Exam   Triage Vital Signs: ED Triage Vitals  Encounter Vitals Group     BP 01/03/24 1330 133/72     Girls Systolic BP Percentile --      Girls Diastolic BP Percentile --      Boys Systolic BP Percentile --      Boys Diastolic BP Percentile --      Pulse Rate 01/03/24 1325 (!) 118     Resp 01/03/24 1330 (!) 24     Temp 01/03/24 1330 97.7 F (36.5 C)     Temp Source 01/03/24 1330 Oral     SpO2 01/03/24 1325 (!) 73 %     Weight 01/03/24 1330 126 lb (57.2 kg)     Height 01/03/24 1330 4' 11.5 (1.511 m)     Head Circumference --      Peak Flow --      Pain Score 01/03/24 1330 0     Pain Loc --      Pain Education --      Exclude from Growth Chart --     Most recent vital signs: Vitals:   01/03/24 1325 01/03/24 1330  BP:  133/72  Pulse: (!) 118   Resp:  (!) 24  Temp:  97.7 F (36.5 C)  SpO2: (!) 73%      General: Awake,  no distress.  CV:  Good peripheral perfusion. RRR, RP 2+ Resp:  Normal effort. coarse breath sounds throughout with mild end expiratory wheezing in bilateral lower lung fields Abd:  No distention. Nontender to deep palpation throughout Other:  Mild bilateral lower extremity edema   ED Results / Procedures / Treatments   Labs (all labs ordered are listed, but only abnormal results are displayed) Labs Reviewed  BLOOD GAS, VENOUS - Abnormal; Notable for the following components:      Result Value   Bicarbonate 28.5 (*)    Acid-Base Excess 3.0 (*)    All other components within normal limits  PRO BRAIN NATRIURETIC PEPTIDE - Abnormal; Notable for the following components:   Pro Brain Natriuretic Peptide 8,113.0 (*)    All other components within normal limits  CBC WITH DIFFERENTIAL/PLATELET - Abnormal; Notable for the following components:   RBC 3.75 (*)    Hemoglobin 11.6 (*)    HCT 34.9 (*)    RDW 15.6 (*)    Neutro Abs 8.4 (*)    Lymphs Abs  0.4 (*)    Abs Immature Granulocytes 0.09 (*)    All other components within normal limits  COMPREHENSIVE METABOLIC PANEL WITH GFR - Abnormal; Notable for the following components:   Sodium 134 (*)    Chloride 95 (*)    Glucose, Bld 171 (*)    Calcium  8.8 (*)    All other components within normal limits  TROPONIN T, HIGH SENSITIVITY - Abnormal; Notable for the following components:   Troponin T High Sensitivity 38 (*)    All other components within normal limits  RESP PANEL BY RT-PCR (RSV, FLU A&B, COVID)  [id number]  TROPONIN T, HIGH SENSITIVITY     EKG  Ecg = sinus tachycardia, rate 115, no gross ST elevation or depression, T wave inversions noted in inferior limb leads as well as V2-V5.  Normal axis, normal intervals.  No clear evidence of ischemia --T wave inversions appear similar to prior from 11/13/2023.   RADIOLOGY Pulmonary edema my interpretation and radiology report reviewed.  CT PE pending.    PROCEDURES:  Critical Care  performed: Yes, see critical care procedure note(s)  .Critical Care  Performed by: Clarine Ozell LABOR, MD Authorized by: Clarine Ozell LABOR, MD   Critical care provider statement:    Critical care time (minutes):  40   Critical care was necessary to treat or prevent imminent or life-threatening deterioration of the following conditions:  Respiratory failure   Critical care was time spent personally by me on the following activities:  Development of treatment plan with patient or surrogate, discussions with consultants, evaluation of patient's response to treatment, examination of patient, ordering and review of laboratory studies, ordering and review of radiographic studies, ordering and performing treatments and interventions, pulse oximetry, re-evaluation of patient's condition and review of old charts   I assumed direction of critical care for this patient from another provider in my specialty: no     Care discussed with: admitting provider      MEDICATIONS ORDERED IN ED: Medications  iohexol  (OMNIPAQUE ) 350 MG/ML injection 75 mL (has no administration in time range)  furosemide  (LASIX ) injection 40 mg (has no administration in time range)  ipratropium-albuterol  (DUONEB) 0.5-2.5 (3) MG/3ML nebulizer solution 6 mL (6 mLs Nebulization Given 01/03/24 1409)     IMPRESSION / MDM / ASSESSMENT AND PLAN / ED COURSE  I reviewed the triage vital signs and the nursing notes.                              DDX/MDM/AP: Differential diagnosis includes, but is not limited to, pneumonia, viral syndrome including influenza or COVID-19, asthma/COPD, worsening of known pulmonary fibrosis, ACS, PE.  Increased oxygen requirement here, was satting in the 70s with EMS on her usual oxygen.  Plan: - DuoNeb - Chest x-ray - Labs -EKG - Chest x-ray - CT PE - High flow nasal cannula  Patient's presentation is most consistent with acute presentation with potential threat to life or bodily function.  The  patient is on the cardiac monitor to evaluate for evidence of arrhythmia and/or significant heart rate changes.  ED course below.  With evidence of pulmonary edema and elevated BNP-diuresing.  Treated with nebs as well.  Signed out to the oncoming ED provider pending CT PE, anticipate likely admission given increased oxygen requirement here.  Clinical Course as of 01/03/24 1612  Tue Jan 03, 2024  1432 CBC without leukocytosis, stable anemia [MM]  1522 BMP reviewed, overall  unremarkable [MM]  1522 Viral swab negative proBNP elevated [MM]  1522 Troponin mildly elevated, suspect type II, EKG nonischemic, will trend [MM]  1523 VBG reviewed, overall reassuring [MM]  1608 CXR: IMPRESSION: 1. Increased diffuse interstitial and bibasilar patchy opacities, which may represent pulmonary edema or atypical infection. 2. Blunting of the right costophrenic angle, which may represent a small pleural effusion.   [MM]  1610 Adding lasix , listed as allergy but tolerates as an outpatient [MM]    Clinical Course User Index [MM] Clarine Ozell LABOR, MD     FINAL CLINICAL IMPRESSION(S) / ED DIAGNOSES   Final diagnoses:  Acute on chronic respiratory failure with hypoxia (HCC)  Acute pulmonary edema (HCC)     Rx / DC Orders   ED Discharge Orders     None        Note:  This document was prepared using Dragon voice recognition software and may include unintentional dictation errors.   Clarine Ozell LABOR, MD 01/03/24 [phone]

## 2024-01-03 NOTE — Assessment & Plan Note (Deleted)
 Continue treprostinil  DuoNebs every 6 and as needed Holding off on steroids at this time

## 2024-01-03 NOTE — ED Triage Notes (Addendum)
 Pt presents to the ED via POV from home with [facility] that started this AM. Pt wears 8L via [place] at home. Pt states that her O2 saturation has been reading in the 70's. Pt wear 6L via [place] upon arrival. O2 saturation 73%. Pt A&Ox4. Denies pain, fevers, chills, or cough.

## 2024-01-03 NOTE — H&P (Incomplete)
 History and Physical    Patient: Susan Sosa FMW:[id number] DOB: September 03, 1940 DOA: 01/03/2024 DOS: the patient was seen and examined on 01/03/2024 PCP: Alla Amis, MD  Patient coming from: Home  Chief Complaint:  Chief Complaint  Patient presents with   Shortness of Breath    HPI: Susan Sosa is a 83 y.o. 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 She presents with acutely worsening shortness of breath starting on the morning of arrival with O2 sats in the 70s at home on her base flow rate of 8 L. In the ED, she was tachycardic and tachypneic with O2 sats 73% on 6 L improving to 100% on 8 L.Venous pH was unremarkable, troponin 38-37 and proBNP 8113.  CMP and CBC are at baseline. EKG shows sinus tachycardia at 115 with nonspecific T wave abnormalities CTA chest negative for PE but showing CHF and pulmonary edema. Patient treated with Lasix  and DuoNeb Admission requested     Past Medical History:  Diagnosis Date   Arthritis    Heart murmur    Hyperlipidemia    Hypertension    Hyponatremia    IPF (idiopathic pulmonary fibrosis) (HCC)    Past Surgical History:  Procedure Laterality Date   LEFT HEART CATH AND CORONARY ANGIOGRAPHY N/A 11/27/2021   Procedure: LEFT HEART CATH AND CORONARY ANGIOGRAPHY and possible pci and stent;  Surgeon: Florencio Cara BIRCH, MD;  Location: [facility];  Service: Cardiovascular;  Laterality: N/A;   RIGHT HEART CATH Right 08/10/2023   Procedure: RIGHT HEART CATH;  Surgeon: Florencio Cara BIRCH, MD;   Location: [facility] CV [facility];  Service: Cardiovascular;  Laterality: Right;   Social History:  reports that she has quit smoking. She has never used smokeless tobacco. She reports that she does not drink alcohol and does not use drugs.  Allergies  Allergen Reactions   Latex Itching and Rash   Lidocaine Swelling    Facial swelling from a dentist Facial swelling from a dentist Facial swelling from a dentist    Atorvastatin      Severe hip pain   Lasix  [Furosemide ] Nausea And Vomiting   Losartan  Swelling   Metronidazole Other (See Comments)    Sore joints Sore joints    Torsemide Nausea And Vomiting   Oxycodone  Nausea Only    Family History  Problem Relation Age of Onset   Stroke Mother    Coronary artery disease Mother    Coronary artery disease Father    Stroke Father    Diabetes Father     Prior to Admission medications   Medication Sig Start Date End Date Taking? Authorizing Provider  albuterol  (PROVENTIL ) (2.5 MG/3ML) 0.083% nebulizer solution Take 2.5 mg by nebulization in the morning and at bedtime. 11/13/19  Yes [provider]  aspirin  81 MG chewable tablet Chew 81 mg by mouth every evening.   Yes [provider]  atorvastatin  (LIPITOR) 40 MG tablet Take 40 mg by mouth daily. 06/27/20  Yes [provider]  azathioprine  (IMURAN ) 100 MG tablet Take 100 mg by mouth daily.   Yes [provider]  budesonide -formoterol (SYMBICORT) 160-4.5 MCG/ACT inhaler Inhale 2 puffs into the lungs 2 (  two) times daily. 08/02/23 08/01/24 Yes [provider]  cholecalciferol  (VITAMIN D3) 25 MCG (1000 UNIT) tablet Take 2 tablets (2,000 Units total) by mouth daily. 11/18/23  Yes Von Bellis, MD  dexamethasone  (DECADRON ) 1 MG tablet Take 1 mg by mouth 2 (two) times daily. 12/08/23  Yes [provider]  esomeprazole (NEXIUM) 40 MG capsule Take 40 mg by mouth at bedtime. 05/30/20  Yes [provider]  guaiFENesin  (MUCINEX ) 600 MG 12 hr  tablet Take 600 mg by mouth daily as needed for cough.  Take 1 tablet (600 mg total) by mouth once daily as needed for Cough or Congestion 12/08/23  Yes [provider]  losartan  (COZAAR ) 50 MG tablet Take 0.5 tablets (25 mg total) by mouth daily as needed (systolic BP greater than 140). 11/18/23  Yes Von Bellis, MD  nitroGLYCERIN  (NITROSTAT ) 0.4 MG SL tablet Place 1 tablet (0.4 mg total) under the tongue every 5 (five) minutes as needed for chest pain. 11/28/21  Yes Caleen Qualia, MD  ondansetron  (ZOFRAN -ODT) 4 MG disintegrating tablet Take 4 mg by mouth every 8 (eight) hours as needed. 12/15/23  Yes [provider]  Pirfenidone  267 MG TABS Take 534 mg by mouth with breakfast, with lunch, and with evening meal. 11/21/19  Yes [provider]  potassium chloride  SA (KLOR-CON  M) 20 MEQ tablet Take 1 tablet (20 mEq total) by mouth daily. 11/18/23 01/03/24 Yes Von Bellis, MD  prochlorperazine  (COMPAZINE ) 10 MG tablet Take 10 mg by mouth every 6 (six) hours as needed. 12/19/23  Yes [provider]  spironolactone  (ALDACTONE ) 25 MG tablet Take 1 tablet (25 mg total) by mouth daily. 11/19/23 11/18/24 Yes Von Bellis, MD  torsemide (DEMADEX) 20 MG tablet Take 40 mg by mouth daily. 12/08/23  Yes [provider]  TYVASO  DPI MAINTENANCE KIT 16 MCG POWD Inhale 16 mcg into the lungs in the morning, at noon, in the evening, and at bedtime. 10/11/23  Yes [provider]  acetylcysteine  (MUCOMYST ) 20 % nebulizer solution Take 4 mLs by nebulization 3 (three) times daily. Patient not taking: Reported on 01/03/2024 04/05/23 08/01/24  [provider]  Dextromethorphan -guaiFENesin  20-200 MG/20ML LIQD Take 10 mLs by mouth every 6 (six) hours as needed. Patient not taking: Reported on 01/03/2024 11/18/23   Von Bellis, MD  furosemide  (LASIX ) 40 MG tablet Take 1 tablet (40 mg total) by mouth daily. Patient not taking: Reported on 01/03/2024 11/18/23 11/17/24   Von Bellis, MD  midodrine  (PROAMATINE ) 10 MG tablet Take 1 tablet (10 mg total) by mouth 3 (three) times daily as needed (Systolic BP less than <100). Patient not taking: Reported on 01/03/2024 11/18/23   Von Bellis, MD  OXYGEN Inhale 6 L into the lungs continuous.    [provider]    Physical Exam: Vitals:   01/03/24 1500 01/03/24 1530 01/03/24 1630 01/03/24 1700  BP: 115/70 128/74 126/83 133/67  Pulse:      Resp: 19 (!) 24 (!) 25 20  Temp:      TempSrc:      SpO2: 100% 100%    Weight:      Height:       Physical Exam  Labs on Admission: I have personally reviewed following labs and imaging studies  CBC: Recent Labs  Lab 01/03/24 1355  WBC 9.2  NEUTROABS 8.4*  HGB 11.6*  HCT 34.9*  MCV 93.1  PLT 340   Basic Metabolic Panel: Recent Labs  Lab 01/03/24 1355  NA 134*  K  4.9  CL 95*  CO2 25  GLUCOSE 171*  BUN 14  CREATININE 0.91  CALCIUM  8.8*   GFR: Estimated Creatinine Clearance: 35.3 mL/min (by C-G formula based on SCr of 0.91 mg/dL). Liver Function Tests: Recent Labs  Lab 01/03/24 1355  AST 20  ALT 11  ALKPHOS 112  BILITOT 0.5  PROT 6.6  ALBUMIN 3.8   No results for input(s): LIPASE, AMYLASE in the last 168 hours. No results for input(s): AMMONIA in the last 168 hours. Coagulation Profile: No results for input(s): INR, PROTIME in the last 168 hours. Cardiac Enzymes: No results for input(s): CKTOTAL, CKMB, CKMBINDEX, TROPONINI in the last 168 hours. BNP (last 3 results) Recent Labs    01/03/24 1355  PROBNP 8,113.0*   HbA1C: No results for input(s): HGBA1C in the last 72 hours. CBG: No results for input(s): GLUCAP in the last 168 hours. Lipid Profile: No results for input(s): CHOL, HDL, LDLCALC, TRIG, CHOLHDL, LDLDIRECT in the last 72 hours. Thyroid Function Tests: No results for input(s): TSH, T4TOTAL, FREET4, T3FREE, THYROIDAB in the last 72 hours. Anemia Panel: No results for  input(s): VITAMINB12, FOLATE, FERRITIN, TIBC, IRON, RETICCTPCT in the last 72 hours. Urine analysis:    Component Value Date/Time   COLORURINE YELLOW (A) 11/12/2023 1753   APPEARANCEUR CLEAR (A) 11/12/2023 1753   LABSPEC 1.015 11/12/2023 1753   PHURINE 5.0 11/12/2023 1753   GLUCOSEU NEGATIVE 11/12/2023 1753   HGBUR SMALL (A) 11/12/2023 1753   BILIRUBINUR NEGATIVE 11/12/2023 1753   KETONESUR NEGATIVE 11/12/2023 1753   PROTEINUR NEGATIVE 11/12/2023 1753   NITRITE NEGATIVE 11/12/2023 1753   LEUKOCYTESUR NEGATIVE 11/12/2023 1753    Radiological Exams on Admission: CT Angio Chest PE W and/or Wo Contrast Result Date: 01/03/2024 CLINICAL DATA:  Acute on chronic hypoxia, shortness of breath EXAM: CT ANGIOGRAPHY CHEST WITH CONTRAST TECHNIQUE: Multidetector CT imaging of the chest was performed using the standard protocol during bolus administration of intravenous contrast. Multiplanar CT image reconstructions and MIPs were obtained to evaluate the vascular anatomy. RADIATION DOSE REDUCTION: This exam was performed according to the departmental dose-optimization program which includes automated exposure control, adjustment of the mA and/or kV according to patient size and/or use of iterative reconstruction technique. CONTRAST:  75mL OMNIPAQUE  IOHEXOL  350 MG/ML SOLN COMPARISON:  01/03/2024, 11/11/2023 FINDINGS: Cardiovascular: This is a technically adequate evaluation of the pulmonary vasculature. There are no filling defects or pulmonary emboli. Marked dilation of the pulmonary artery is again noted consistent with pulmonary arterial hypertension. The heart is enlarged, with prominent right ventricular dilatation again noted. No evidence of thoracic aortic aneurysm or dissection. Atherosclerosis of the aorta and coronary vasculature. Mediastinum/Nodes: No enlarged mediastinal, hilar, or axillary lymph nodes. Thyroid gland, trachea, and esophagus demonstrate no significant findings. Lungs/Pleura:  There is background emphysema and fibrosis, with superimposed interlobular septal thickening and scattered bibasilar ground-glass airspace disease compatible with developing pulmonary edema. No effusion or pneumothorax. The central airways are patent. Upper Abdomen: No acute abnormality. Musculoskeletal: No acute or destructive bony abnormalities. Reconstructed images demonstrate no additional findings. Review of the MIP images confirms the above findings. IMPRESSION: 1. No evidence of pulmonary embolus. 2. Findings consistent with congestive heart failure and mild pulmonary edema, superimposed upon background emphysema and fibrosis. 3. Stable cardiomegaly, with dilated pulmonary arteries consistent with pulmonary arterial hypertension. 4. Aortic Atherosclerosis (ICD10-I70.0) and Emphysema (ICD10-J43.9). Electronically Signed   By: Ozell Daring M.D.   On: 01/03/2024 18:37   DG Chest Portable 1 View Result Date: 01/03/2024 CLINICAL DATA:  Hypoxia EXAM: PORTABLE CHEST 1 VIEW COMPARISON:  Chest radiograph dated 11/18/2023 FINDINGS: Low lung volumes. Increased diffuse interstitial and bibasilar patchy opacities. Blunting of the right costophrenic angle. No pneumothorax. Similar enlarged cardiomediastinal silhouette. No acute osseous abnormality. Bilateral humeral bone anchors. IMPRESSION: 1. Increased diffuse interstitial and bibasilar patchy opacities, which may represent pulmonary edema or atypical infection. 2. Blunting of the right costophrenic angle, which may represent a small pleural effusion. Electronically Signed   By: Limin  Xu M.D.   On: 01/03/2024 15:30   Data Reviewed for HPI: Relevant notes from primary care and specialist visits, past discharge summaries as available in EHR, including Care Everywhere. Prior diagnostic testing as pertinent to current admission diagnoses Updated medications and problem lists for reconciliation ED course, including vitals, labs, imaging, treatment and response to  treatment Triage notes, nursing and pharmacy notes and ED provider's notes Notable results as noted above in HPI      Assessment and Plan: * Acute on chronic systolic CHF (congestive heart failure) (HCC) Nonischemic cardiomyopathy Acute on chronic respiratory failure with hypoxia Patient presents with acute onset shortness of breath with increased O2 requirement in spite of outpatient intensification of diuretic regimen Clinically fluid overloaded on exam, proBNP 8113 and CTA chest showing pulmonary edema IV Lasix  Continue GDMT with spironolactone , losartan  Holding salt tablet for salt restriction History of HFrEF with improved EF (35% 2023-> 55% 09/2023)  Daily weights and intake and output monitoring  Pulmonary hypertension, severe (HCC) Idiopathic pulmonary fibrosis-on O2 at 8 L at baseline Continue treprostinil  Continue azathioprine  and dexamethasone   Coronary artery disease, nonobstructive with history of NSTEMI 11/2023 No complaints of chest pain, EKG nonacute Continue aspirin , atorvastatin , losartan , nitroglycerin   Chronic hyponatremia On salt tablets Will hold NaCl in the setting of acute CHF exacerbation Monitor serum sodium  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    DVT prophylaxis: Lovenox   Consults: none  Advance Care Planning:   Code Status: Prior   Family Communication: none  Disposition Plan: Back to previous home environment  Severity of Illness: The appropriate patient status for this patient is OBSERVATION. Observation status is judged to be reasonable and necessary in order to provide the required intensity of service to ensure the patient's safety. The patient's presenting symptoms, physical exam findings, and initial radiographic and laboratory data in the context of their medical condition is felt to place them at decreased risk for  further clinical deterioration. Furthermore, it is anticipated that the patient will be medically stable for discharge from the hospital within 2 midnights of admission.   Author: Delayne LULLA Solian, MD 01/03/2024 9:06 PM  For on call review [email or web address].

## 2024-01-03 NOTE — ED Notes (Signed)
 This RN called pharmacy to inquire about phenergan . Pharmacy stated that they will make it and tube it up as soon as possible.

## 2024-01-04 ENCOUNTER — Other Ambulatory Visit: Payer: Self-pay

## 2024-01-04 DIAGNOSIS — J810 Acute pulmonary edema: Secondary | ICD-10-CM | POA: Diagnosis present

## 2024-01-04 DIAGNOSIS — I5023 Acute on chronic systolic (congestive) heart failure: Secondary | ICD-10-CM | POA: Diagnosis present

## 2024-01-04 DIAGNOSIS — Z79899 Other long term (current) drug therapy: Secondary | ICD-10-CM | POA: Diagnosis not present

## 2024-01-04 DIAGNOSIS — E871 Hypo-osmolality and hyponatremia: Secondary | ICD-10-CM | POA: Diagnosis present

## 2024-01-04 DIAGNOSIS — E785 Hyperlipidemia, unspecified: Secondary | ICD-10-CM | POA: Diagnosis present

## 2024-01-04 DIAGNOSIS — I110 Hypertensive heart disease with heart failure: Secondary | ICD-10-CM | POA: Diagnosis present

## 2024-01-04 DIAGNOSIS — Z87891 Personal history of nicotine dependence: Secondary | ICD-10-CM | POA: Diagnosis not present

## 2024-01-04 DIAGNOSIS — Z823 Family history of stroke: Secondary | ICD-10-CM | POA: Diagnosis not present

## 2024-01-04 DIAGNOSIS — J84112 Idiopathic pulmonary fibrosis: Secondary | ICD-10-CM | POA: Diagnosis present

## 2024-01-04 DIAGNOSIS — Z8249 Family history of ischemic heart disease and other diseases of the circulatory system: Secondary | ICD-10-CM | POA: Diagnosis not present

## 2024-01-04 DIAGNOSIS — Z7982 Long term (current) use of aspirin: Secondary | ICD-10-CM | POA: Diagnosis not present

## 2024-01-04 DIAGNOSIS — Z833 Family history of diabetes mellitus: Secondary | ICD-10-CM | POA: Diagnosis not present

## 2024-01-04 DIAGNOSIS — I428 Other cardiomyopathies: Secondary | ICD-10-CM | POA: Diagnosis present

## 2024-01-04 DIAGNOSIS — J432 Centrilobular emphysema: Secondary | ICD-10-CM | POA: Diagnosis present

## 2024-01-04 DIAGNOSIS — I252 Old myocardial infarction: Secondary | ICD-10-CM | POA: Diagnosis not present

## 2024-01-04 DIAGNOSIS — J9621 Acute and chronic respiratory failure with hypoxia: Secondary | ICD-10-CM | POA: Diagnosis present

## 2024-01-04 DIAGNOSIS — Z66 Do not resuscitate: Secondary | ICD-10-CM | POA: Diagnosis present

## 2024-01-04 DIAGNOSIS — J9811 Atelectasis: Secondary | ICD-10-CM | POA: Diagnosis present

## 2024-01-04 DIAGNOSIS — I272 Pulmonary hypertension, unspecified: Secondary | ICD-10-CM | POA: Diagnosis present

## 2024-01-04 DIAGNOSIS — I2510 Atherosclerotic heart disease of native coronary artery without angina pectoris: Secondary | ICD-10-CM | POA: Diagnosis present

## 2024-01-04 DIAGNOSIS — Z7951 Long term (current) use of inhaled steroids: Secondary | ICD-10-CM | POA: Diagnosis not present

## 2024-01-04 DIAGNOSIS — I952 Hypotension due to drugs: Secondary | ICD-10-CM | POA: Diagnosis present

## 2024-01-04 DIAGNOSIS — I4719 Other supraventricular tachycardia: Secondary | ICD-10-CM | POA: Diagnosis present

## 2024-01-04 DIAGNOSIS — I2781 Cor pulmonale (chronic): Secondary | ICD-10-CM | POA: Diagnosis present

## 2024-01-04 DIAGNOSIS — D84821 Immunodeficiency due to drugs: Secondary | ICD-10-CM | POA: Diagnosis present

## 2024-01-04 LAB — BASIC METABOLIC PANEL WITH GFR
Anion gap: 12 (ref 5–15)
BUN: 13 mg/dL (ref 8–23)
CO2: 28 mmol/L (ref 22–32)
Calcium: 8.8 mg/dL — ABNORMAL LOW (ref 8.9–10.3)
Chloride: 94 mmol/L — ABNORMAL LOW (ref 98–111)
Creatinine, Ser: 1.02 mg/dL — ABNORMAL HIGH (ref 0.44–1.00)
GFR, Estimated: 54 mL/min — ABNORMAL LOW (ref >60)
Glucose, Bld: 122 mg/dL — ABNORMAL HIGH (ref 70–99)
Potassium: 4.7 mmol/L (ref 3.5–5.1)
Sodium: 134 mmol/L — ABNORMAL LOW (ref 135–145)

## 2024-01-04 LAB — RESPIRATORY PANEL BY PCR

## 2024-01-04 LAB — CBC
HCT: 34.2 % — ABNORMAL LOW (ref 36.0–46.0)
Hemoglobin: 11.2 g/dL — ABNORMAL LOW (ref 12.0–15.0)
MCH: 30.4 pg (ref 26.0–34.0)
MCHC: 32.7 g/dL (ref 30.0–36.0)
MCV: 92.9 fL (ref 80.0–100.0)
Platelets: 331 K/uL (ref 150–400)
RBC: 3.68 MIL/uL — ABNORMAL LOW (ref 3.87–5.11)
RDW: 15.6 % — ABNORMAL HIGH (ref 11.5–15.5)
WBC: 8.7 K/uL (ref 4.0–10.5)
nRBC: 0 % (ref 0.0–0.2)

## 2024-01-04 LAB — PHOSPHORUS: Phosphorus: 4.8 mg/dL — ABNORMAL HIGH (ref 2.5–4.6)

## 2024-01-04 LAB — MAGNESIUM: Magnesium: 1.8 mg/dL (ref 1.7–2.4)

## 2024-01-04 MED ORDER — IPRATROPIUM-ALBUTEROL 0.5-2.5 (3) MG/3ML IN SOLN
3.0000 mL | Freq: Three times a day (TID) | RESPIRATORY_TRACT | Status: DC
  Administered 2024-01-04 – 2024-01-06 (×5): 3 mL via RESPIRATORY_TRACT
  Filled 2024-01-04 (×5): qty 3

## 2024-01-04 MED ORDER — TREPROSTINIL 16 MCG IN POWD
16.0000 ug | Freq: Four times a day (QID) | RESPIRATORY_TRACT | Status: DC
  Administered 2024-01-04 – 2024-01-07 (×12): 16 ug via RESPIRATORY_TRACT
  Filled 2024-01-04 (×14): qty 1

## 2024-01-04 MED ORDER — TREPROSTINIL 16 MCG IN POWD
16.0000 ug | Freq: Four times a day (QID) | RESPIRATORY_TRACT | Status: DC
  Filled 2024-01-04: qty 1

## 2024-01-04 MED ORDER — DIAZEPAM 2 MG PO TABS
2.0000 mg | ORAL_TABLET | Freq: Every evening | ORAL | Status: AC | PRN
  Administered 2024-01-04: 2 mg via ORAL
  Filled 2024-01-04: qty 1

## 2024-01-04 MED ORDER — PIRFENIDONE 267 MG PO TABS
534.0000 mg | ORAL_TABLET | Freq: Three times a day (TID) | ORAL | Status: DC
  Administered 2024-01-04 – 2024-01-07 (×9): 534 mg via ORAL
  Filled 2024-01-04 (×12): qty 2

## 2024-01-04 MED ORDER — METOPROLOL TARTRATE 5 MG/5ML IV SOLN: 5.0000 mg | Freq: Four times a day (QID) | INTRAVENOUS | Status: DC | PRN

## 2024-01-04 MED ORDER — DIAZEPAM 2 MG PO TABS
2.0000 mg | ORAL_TABLET | Freq: Every evening | ORAL | Status: DC | PRN
  Administered 2024-01-04: 2 mg via ORAL
  Filled 2024-01-04: qty 1

## 2024-01-04 NOTE — Consult Note (Signed)
 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 Recent addition of Imuran  due to progressive decline in PFT.  She at times gets respiratory infections but generally bounces back after course of abx and steroid taper.  She has also been on steroids with decadron  with partial improvement. 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  Her PFT have also declined over last five years but FEV1 this year has been spared >80% with severe decrement in DLCO as excpected with PH and ILD.  She has a previous history of smoking for appx 40years and does have emphysematous changes overlying her UIP pattern of fibrosis. She came in to hospital due to worsening hypoxemia.   Patient states she prefers using portable potty and she would like to do that privately if possible. She's on 8L/min [place].  I have ordered metaneb chest physiotherapy  PAST MEDICAL HISTORY   Past Medical History:  Diagnosis Date   Arthritis    Heart murmur    Hyperlipidemia    Hypertension    Hyponatremia    IPF (idiopathic pulmonary fibrosis) (HCC)      SURGICAL HISTORY   Past Surgical History:  Procedure Laterality Date   LEFT HEART CATH AND CORONARY ANGIOGRAPHY N/A 11/27/2021   Procedure: LEFT HEART CATH AND CORONARY ANGIOGRAPHY and possible  pci and stent;  Surgeon: Florencio Cara BIRCH, MD;  Location: [facility] INVASIVE CV LAB;  Service: Cardiovascular;  Laterality: N/A;   RIGHT HEART CATH Right 08/10/2023   Procedure: RIGHT HEART CATH;  Surgeon: Florencio Cara BIRCH, MD;  Location: [facility] INVASIVE CV LAB;  Service: Cardiovascular;  Laterality: Right;     FAMILY HISTORY   Family History  Problem Relation Age of Onset   Stroke Mother    Coronary artery disease Mother    Coronary artery disease Father    Stroke Father    Diabetes Father      SOCIAL HISTORY   Social History   Tobacco Use   Smoking status: Former   Smokeless tobacco: Never   Tobacco comments:    Quit 30 years ago  Substance Use Topics   Alcohol use: Never   Drug use: Never     MEDICATIONS      Current Medication:  Current Facility-Administered Medications:    acetaminophen  (TYLENOL ) tablet 650 mg, 650 mg, Oral, Q6H PRN **OR** acetaminophen  (TYLENOL ) suppository 650 mg, 650 mg, Rectal, Q6H PRN, Cleatus Delayne GAILS, MD   albuterol  (PROVENTIL ) (2.5 MG/3ML) 0.083% nebulizer solution 2.5 mg, 2.5 mg, Nebulization, Q2H PRN, Cleatus Delayne GAILS, MD   aspirin  chewable tablet 81 mg, 81 mg, Oral, QPM, Cleatus Delayne GAILS, MD, 81 mg at 01/03/24 2146   atorvastatin  (LIPITOR) tablet  40 mg, 40 mg, Oral, Daily, Cleatus Delayne GAILS, MD, 40 mg at 01/04/24 9047   azaTHIOprine  (IMURAN ) tablet 100 mg, 100 mg, Oral, Daily, Cleatus Delayne GAILS, MD, 100 mg at 01/04/24 0957   cholecalciferol  (VITAMIN D3) 25 MCG (1000 UNIT) tablet 2,000 Units, 2,000 Units, Oral, Daily, Cleatus Delayne GAILS, MD, 2,000 Units at 01/04/24 9047   dexamethasone  (DECADRON ) tablet 1 mg, 1 mg, Oral, BID, Cleatus Delayne GAILS, MD, 1 mg at 01/04/24 9045   enoxaparin  (LOVENOX ) injection 40 mg, 40 mg, Subcutaneous, QHS, Cleatus Delayne GAILS, MD, 40 mg at 01/03/24 2147   fluticasone  furoate-vilanterol (BREO ELLIPTA ) 100-25 MCG/ACT 1 puff, 1 puff, Inhalation, Daily, Cleatus Delayne GAILS, MD, 1 puff at 01/04/24 1001   furosemide  (LASIX ) injection 40  mg, 40 mg, Intravenous, Q12H, Cleatus Delayne GAILS, MD, 40 mg at 01/04/24 9041   guaiFENesin  (MUCINEX ) 12 hr tablet 600 mg, 600 mg, Oral, BID, Cleatus Delayne GAILS, MD, 600 mg at 01/04/24 9044   guaiFENesin -dextromethorphan  (ROBITUSSIN DM) 100-10 MG/5ML syrup 10 mL, 10 mL, Oral, Q6H PRN, Cleatus Delayne GAILS, MD   HYDROcodone -acetaminophen  (NORCO/VICODIN) 5-325 MG per tablet 1-2 tablet, 1-2 tablet, Oral, Q4H PRN, Cleatus Delayne GAILS, MD   ipratropium-albuterol  (DUONEB) 0.5-2.5 (3) MG/3ML nebulizer solution 3 mL, 3 mL, Nebulization, Q6H, Cleatus Delayne GAILS, MD, 3 mL at 01/04/24 1442   metoprolol tartrate (LOPRESSOR) injection 5 mg, 5 mg, Intravenous, Q6H PRN, Von Bellis, MD   midodrine  (PROAMATINE ) tablet 10 mg, 10 mg, Oral, TID PRN, Cleatus Delayne GAILS, MD   nitroGLYCERIN  (NITROSTAT ) SL tablet 0.4 mg, 0.4 mg, Sublingual, Q5 min PRN, Cleatus Delayne GAILS, MD   ondansetron  (ZOFRAN ) tablet 4 mg, 4 mg, Oral, Q6H PRN **OR** ondansetron  (ZOFRAN ) injection 4 mg, 4 mg, Intravenous, Q6H PRN, Cleatus Delayne GAILS, MD, 4 mg at 01/04/24 9050   Pirfenidone  TABS 534 mg, 534 mg, Oral, TID with meals, Von Bellis, MD, 534 mg at 01/04/24 1135   potassium chloride  SA (KLOR-CON  M) CR tablet 20 mEq, 20 mEq, Oral, Daily, Cleatus Delayne GAILS, MD, 20 mEq at 01/04/24 0957   promethazine  (PHENERGAN ) 6.25 mg/NS 50 mL IVPB, 6.25 mg, Intravenous, Q6H PRN, Cleatus Delayne GAILS, MD   spironolactone  (ALDACTONE ) tablet 25 mg, 25 mg, Oral, Daily, Cleatus Delayne GAILS, MD, 25 mg at 01/04/24 9045   Treprostinil  POWD 16 mcg, 16 mcg, Inhalation, QID, Von Bellis, MD, 16 mcg at 01/04/24 1210    ALLERGIES   Latex, Lidocaine, Atorvastatin , Lasix  [furosemide ], Losartan , Metronidazole, Torsemide, and Oxycodone      REVIEW OF SYSTEMS    Review of Systems:  Gen:  Denies  fever, sweats, chills weigh loss  HEENT: Denies blurred vision, double vision, ear pain, eye pain, hearing loss, nose bleeds, sore throat Cardiac:  No dizziness, chest pain or heaviness, chest  tightness,edema Resp:   reports dyspnea chronically  Gi: Denies swallowing difficulty, stomach pain, nausea or vomiting, diarrhea, constipation, bowel incontinence Gu:  Denies bladder incontinence, burning urine Ext:   Denies Joint pain, stiffness or swelling Skin: Denies  skin rash, easy bruising or bleeding or hives Endoc:  Denies polyuria, polydipsia , polyphagia or weight change Psych:   Denies depression, insomnia or hallucinations   Other:  All other systems negative   VS: BP 111/61 (BP Location: Left Arm)   Pulse 96   Temp 99.4 F (37.4 C)   Resp 20   Ht 4' 11.5 (1.511 m)   Wt 57.2 kg   SpO2 94%   BMI 25.02 kg/m  PHYSICAL EXAM    GENERAL:NAD, no fevers, chills, no weakness no fatigue HEAD: Normocephalic, atraumatic.  EYES: Pupils equal, round, reactive to light. Extraocular muscles intact. No scleral icterus.  MOUTH: Moist mucosal membrane. Dentition intact. No abscess noted.  EAR, NOSE, THROAT: Clear without exudates. No external lesions.  NECK: Supple. No thyromegaly. No nodules. No JVD.  PULMONARY: decreased breath sounds with mild rhonchi worse at bases bilaterally.  CARDIOVASCULAR: S1 and S2. Regular rate and rhythm. No murmurs, rubs, or gallops. No edema. Pedal pulses 2+ bilaterally.  GASTROINTESTINAL: Soft, nontender, nondistended. No masses. Positive bowel sounds. No hepatosplenomegaly.  MUSCULOSKELETAL: No swelling, clubbing, or edema. Range of motion full in all extremities.  NEUROLOGIC: Cranial nerves II through XII are intact. No gross focal neurological deficits. Sensation intact. Reflexes intact.  SKIN: No ulceration, lesions, rashes, or cyanosis. Skin warm and dry. Turgor intact.  PSYCHIATRIC: Mood, affect within normal limits. The patient is awake, alert and oriented x 3. Insight, judgment intact.       IMAGING   Narrative & Impression  CLINICAL DATA:  Acute on chronic hypoxia, shortness of breath   EXAM: CT ANGIOGRAPHY CHEST WITH  CONTRAST   TECHNIQUE: Multidetector CT imaging of the chest was performed using the standard protocol during bolus administration of intravenous contrast. Multiplanar CT image reconstructions and MIPs were obtained to evaluate the vascular anatomy.   RADIATION DOSE REDUCTION: This exam was performed according to the departmental dose-optimization program which includes automated exposure control, adjustment of the mA and/or kV according to patient size and/or use of iterative reconstruction technique.   CONTRAST:  75mL OMNIPAQUE  IOHEXOL  350 MG/ML SOLN   COMPARISON:  01/03/2024, 11/11/2023   FINDINGS: Cardiovascular: This is a technically adequate evaluation of the pulmonary vasculature. There are no filling defects or pulmonary emboli. Marked dilation of the pulmonary artery is again noted consistent with pulmonary arterial hypertension.   The heart is enlarged, with prominent right ventricular dilatation again noted. No evidence of thoracic aortic aneurysm or dissection. Atherosclerosis of the aorta and coronary vasculature.   Mediastinum/Nodes: No enlarged mediastinal, hilar, or axillary lymph nodes. Thyroid gland, trachea, and esophagus demonstrate no significant findings.   Lungs/Pleura: There is background emphysema and fibrosis, with superimposed interlobular septal thickening and scattered bibasilar ground-glass airspace disease compatible with developing pulmonary edema. No effusion or pneumothorax. The central airways are patent.   Upper Abdomen: No acute abnormality.   Musculoskeletal: No acute or destructive bony abnormalities. Reconstructed images demonstrate no additional findings.   Review of the MIP images confirms the above findings.   IMPRESSION: 1. No evidence of pulmonary embolus. 2. Findings consistent with congestive heart failure and mild pulmonary edema, superimposed upon background emphysema and fibrosis. 3. Stable cardiomegaly, with dilated  pulmonary arteries consistent with pulmonary arterial hypertension. 4. Aortic Atherosclerosis (ICD10-I70.0) and Emphysema (ICD10-J43.9).     Electronically Signed   By: Ozell Daring M.D.   On: 01/03/2024 18:37    ASSESSMENT/PLAN   Acute on chronic hypoxemic respiratory failure       Currently negative for viral LRTI due to negative RVP and SARS/FLU/RSV panel      - inflammatory workup -ddimer, CRP      - VBG today      - 0 Result Notes    Component Ref Range & Units (hover) 1 d ago  Pro Brain Natriuretic Peptide 8,113.0 High          Idiopathic pulmonary fibrosis with acute exacerbation    - advanced and progressed over  past 5 years    - continue decadron  2mg  bid    Centrilobular emphysema -no signs of acute exacerbation of COPD  - viral workup and inflammatory biomarkers are negative   - duoneb prn is sufficient at this time     Pulmonary hypertension associated with pulmonary fibrosis    - will dc tyvasso DPI at home with poor tolerance will switch to [place] going forward  -pre and post capillary per RHC done this year   -PT/OT ordered   Bibasilar atelectasis   - recruitement maneuvers via chest physiotherapy utilizing metaneb with duoneb     - PT /OT  Physical deconditioning   Continue PT/OT , continue chest PT  BODE score >8 with poor long term prognosis   Goals of care     - patient is currently DNR      Thank you for allowing me to participate in the care of this patient.   Patient/Family are satisfied with care plan and all questions have been answered.    Provider disclosure: Patient with at least one acute or chronic illness or injury that poses a threat to life or bodily function and is being managed actively during this encounter.  All of the below services have been performed independently by signing provider:  review of prior documentation from internal and or external health records.  Review of previous and current lab results.   Interview and comprehensive assessment during patient visit today. Review of current and previous chest radiographs/CT scans. Discussion of management and test interpretation with health care team and patient/family.   This document was prepared using Dragon voice recognition software and may include unintentional dictation errors.     Vedanth Sirico, M.D.  Division of Pulmonary & Critical Care Medicine

## 2024-01-04 NOTE — ED Notes (Signed)
 Pt eating breakfast tray

## 2024-01-04 NOTE — Progress Notes (Signed)
° °  Brief Progress Note   _____________________________________________________________________________________________________________  Patient Name: Susan Sosa Patient DOB: [birth year] Date: @TODAY @      Data: Reviewed labs, notes, VS, HFNC.    Action: No action needed at this time.      Response:    _____________________________________________________________________________________________________________  The [facility] RN Expeditor Sharolyn JONETTA Batman Please contact us  directly via secure chat (search for [facility]) or by calling us  at [phone] [facility]).

## 2024-01-04 NOTE — Progress Notes (Addendum)
 [facility] Progress Note  Patient: Susan Sosa    FMW:[id number]  DOA: 01/03/2024     Date of Service: the patient was seen and examined on 01/04/2024  Chief Complaint  Patient presents with   Shortness of Breath   Brief hospital course: Susan Sosa is a 83 y.o. 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 She presents with acutely worsening shortness of breath starting on the morning of arrival with O2 sats in the 70s at home on her base flow rate of 8 L. In the ED, she was tachycardic and tachypneic with O2 sats 73% on 6 L improving to 100% on 8 L.Venous pH was unremarkable, troponin 38-37 and proBNP 8113.  CMP and CBC are at baseline. EKG shows sinus tachycardia at 115 with nonspecific T wave abnormalities CTA chest negative for PE but showing CHF and pulmonary edema. Patient treated with Lasix  and DuoNeb Admission requested   Assessment and Plan:  # Acute on chronic systolic CHF (congestive heart failure) (HCC) Severe pulmonary hypertension, possible acute cor pulmonale Nonischemic cardiomyopathy Acute on chronic respiratory failure with hypoxia Patient presents with acute onset shortness of breath with increased O2 requirement in spite of outpatient intensification of diuretic regimen Clinically fluid overloaded on exam, proBNP 8113 and CTA chest showing pulmonary edema Continue Lasix  40 mg IV twice daily Continue GDMT with spironolactone , losartan  Holding salt tablet for salt restriction History of HFrEF with improved EF (35% 2023-> 55% 09/2023)  Daily weights  and intake and output monitoring Follow-up pulmonary consult Check RVP panel   Pulmonary hypertension, severe (HCC) Idiopathic pulmonary fibrosis-on O2 at 8 L at baseline Continue treprostinil  Continue azathioprine  and dexamethasone  Continue Mucinex  600 mg p.o. 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      Body mass index is 25.02 kg/m.  Interventions:  Diet: Heart healthy DVT Prophylaxis: Subcutaneous Lovenox    Advance goals of care discussion: DNR-limited  Family Communication: family was present at bedside, at the time of interview.  The pt provided permission to discuss medical plan with the family. Opportunity was given to ask question and all questions were answered satisfactorily.   Disposition:  Pt is from home, admitted with respiratory failure, still has SOB, which precludes a safe discharge. Discharge to home, when, may need 1-2 more days to improve.  Subjective: No significant events overnight.  Patient still has significant of breath and productive cough, feels lipid improvement since yesterday.  Denied any other complaints.  Physical Exam: General: NAD, lying comfortably Appear in no distress, affect appropriate Eyes: PERRLA ENT: Oral Mucosa Clear, moist  Neck: no JVD,  Cardiovascular: S1 and S2 Present, no Murmur,  Respiratory: Equal air entry bilaterally, b/l crackles, no significant wheezes  Abdomen: Bowel Sound present, Soft and no tenderness,  Skin:  no rashes Extremities: no Pedal  edema, no calf tenderness Neurologic: without any new focal findings Gait not checked due to patient safety concerns  Vitals:   01/04/24 1200 01/04/24 1211 01/04/24 1230 01/04/24 1257  BP: 113/64  104/66 111/61  Pulse:    96  Resp: (!) 27  (!) 26 20  Temp:    99.4 F (37.4 C)  TempSrc:      SpO2:  94%  90%  Weight:      Height:        Intake/Output Summary (Last 24 hours) at 01/04/2024 1314 Last data filed at 01/04/2024 1005 Gross per 24 hour  Intake --  Output 2400 ml  Net -2400 ml   Filed Weights   01/03/24 1330  Weight: 57.2 kg    Data Reviewed: I have personally reviewed and interpreted daily labs, tele strips, imagings as discussed above. I reviewed all nursing notes, pharmacy notes, vitals, pertinent old records I have discussed plan of care as described above with RN and patient/family.  CBC: Recent Labs  Lab 01/03/24 1355 01/04/24 0422  WBC 9.2 8.7  NEUTROABS 8.4*  --   HGB 11.6* 11.2*  HCT 34.9* 34.2*  MCV 93.1 92.9  PLT 340 331   Basic Metabolic Panel: Recent Labs  Lab 01/03/24 1355 01/04/24 0422  NA 134* 134*  K 4.9 4.7  CL 95* 94*  CO2 25 28  GLUCOSE 171* 122*  BUN 14 13  CREATININE 0.91 1.02*  CALCIUM  8.8* 8.8*  MG  --  1.8  PHOS  --  4.8*    Studies: CT Angio Chest PE W and/or Wo Contrast Result Date: 01/03/2024 CLINICAL DATA:  Acute on chronic hypoxia, shortness of breath EXAM: CT ANGIOGRAPHY CHEST WITH CONTRAST TECHNIQUE: Multidetector CT imaging of the chest was performed using the standard protocol during bolus administration of intravenous contrast. Multiplanar CT image reconstructions and MIPs were obtained to evaluate the vascular anatomy. RADIATION DOSE REDUCTION: This exam was performed according to the departmental dose-optimization program which includes automated exposure control, adjustment of the mA and/or kV according to patient size and/or use of iterative reconstruction technique. CONTRAST:  75mL OMNIPAQUE  IOHEXOL  350  MG/ML SOLN COMPARISON:  01/03/2024, 11/11/2023 FINDINGS: Cardiovascular: This is a technically adequate evaluation of the pulmonary vasculature. There are no filling defects or pulmonary emboli. Marked dilation of the pulmonary artery is again noted consistent with pulmonary arterial hypertension. The heart is enlarged, with prominent right ventricular dilatation again noted. No evidence of thoracic aortic aneurysm or dissection. Atherosclerosis of the aorta and coronary vasculature. Mediastinum/Nodes: No enlarged mediastinal, hilar, or axillary lymph nodes. Thyroid gland, trachea, and esophagus demonstrate no significant findings. Lungs/Pleura: There is background emphysema and fibrosis, with superimposed interlobular septal thickening and scattered bibasilar ground-glass airspace disease compatible with developing pulmonary edema. No effusion or pneumothorax. The central airways are patent. Upper Abdomen: No acute abnormality. Musculoskeletal: No acute or destructive bony abnormalities. Reconstructed images demonstrate no additional findings. Review of the MIP images confirms the above findings. IMPRESSION: 1. No evidence of pulmonary embolus. 2. Findings consistent with congestive heart failure and mild pulmonary edema, superimposed upon background emphysema and fibrosis. 3. Stable cardiomegaly, with dilated pulmonary arteries consistent with pulmonary arterial hypertension. 4. Aortic Atherosclerosis (ICD10-I70.0) and Emphysema (ICD10-J43.9). Electronically Signed   By: Ozell Daring M.D.   On: 01/03/2024 18:37   DG Chest Portable 1 View Result Date: 01/03/2024 CLINICAL DATA:  Hypoxia EXAM: PORTABLE CHEST 1 VIEW COMPARISON:  Chest radiograph dated 11/18/2023 FINDINGS: Low  lung volumes. Increased diffuse interstitial and bibasilar patchy opacities. Blunting of the right costophrenic angle. No pneumothorax. Similar enlarged cardiomediastinal silhouette. No acute osseous abnormality. Bilateral humeral bone  anchors. IMPRESSION: 1. Increased diffuse interstitial and bibasilar patchy opacities, which may represent pulmonary edema or atypical infection. 2. Blunting of the right costophrenic angle, which may represent a small pleural effusion. Electronically Signed   By: Limin  Xu M.D.   On: 01/03/2024 15:30    Scheduled Meds:  aspirin   81 mg Oral QPM   atorvastatin   40 mg Oral Daily   azaTHIOprine   100 mg Oral Daily   cholecalciferol   2,000 Units Oral Daily   dexamethasone   1 mg Oral BID   enoxaparin  (LOVENOX ) injection  40 mg Subcutaneous QHS   fluticasone  furoate-vilanterol  1 puff Inhalation Daily   furosemide   40 mg Intravenous Q12H   guaiFENesin   600 mg Oral BID   ipratropium-albuterol   3 mL Nebulization Q6H   Pirfenidone   534 mg Oral TID with meals   potassium chloride  SA  20 mEq Oral Daily   spironolactone   25 mg Oral Daily   Treprostinil   16 mcg Inhalation QID   Continuous Infusions:  promethazine  (PHENERGAN ) injection (IM or IVPB)     PRN Meds: acetaminophen  **OR** acetaminophen , albuterol , guaiFENesin -dextromethorphan , HYDROcodone -acetaminophen , metoprolol tartrate, midodrine , nitroGLYCERIN , ondansetron  **OR** ondansetron  (ZOFRAN ) IV, promethazine  (PHENERGAN ) injection (IM or IVPB)  Time spent: 55 minutes  Author: ELVAN SOR. MD [facility] 01/04/2024 1:14 PM  To reach On-call, see care teams to locate the attending and reach out to them via [email or web address]. If 7PM-7AM, please contact night-coverage If you still have difficulty reaching the attending provider, please page the [facility] (Director on Call) for [facility] on amion for assistance.

## 2024-01-05 LAB — BASIC METABOLIC PANEL WITH GFR
Anion gap: 13 (ref 5–15)
BUN: 9 mg/dL (ref 8–23)
CO2: 30 mmol/L (ref 22–32)
Calcium: 9.1 mg/dL (ref 8.9–10.3)
Chloride: 92 mmol/L — ABNORMAL LOW (ref 98–111)
Creatinine, Ser: 0.97 mg/dL (ref 0.44–1.00)
GFR, Estimated: 57 mL/min — ABNORMAL LOW (ref >60)
Glucose, Bld: 93 mg/dL (ref 70–99)
Potassium: 4.2 mmol/L (ref 3.5–5.1)
Sodium: 135 mmol/L (ref 135–145)

## 2024-01-05 LAB — CBC
HCT: 38.5 % (ref 36.0–46.0)
Hemoglobin: 12.4 g/dL (ref 12.0–15.0)
MCH: 29.7 pg (ref 26.0–34.0)
MCHC: 32.2 g/dL (ref 30.0–36.0)
MCV: 92.3 fL (ref 80.0–100.0)
Platelets: 376 K/uL (ref 150–400)
RBC: 4.17 MIL/uL (ref 3.87–5.11)
RDW: 15.6 % — ABNORMAL HIGH (ref 11.5–15.5)
WBC: 8.2 K/uL (ref 4.0–10.5)
nRBC: 0 % (ref 0.0–0.2)

## 2024-01-05 LAB — MAGNESIUM: Magnesium: 1.8 mg/dL (ref 1.7–2.4)

## 2024-01-05 LAB — PHOSPHORUS: Phosphorus: 4.8 mg/dL — ABNORMAL HIGH (ref 2.5–4.6)

## 2024-01-05 MED ORDER — DEXAMETHASONE 0.5 MG PO TABS
1.0000 mg | ORAL_TABLET | Freq: Every day | ORAL | Status: DC
  Administered 2024-01-06 – 2024-01-07 (×2): 1 mg via ORAL
  Filled 2024-01-05 (×3): qty 2

## 2024-01-05 MED ORDER — MIDODRINE HCL 5 MG PO TABS
5.0000 mg | ORAL_TABLET | Freq: Three times a day (TID) | ORAL | Status: DC | PRN
  Administered 2024-01-05: 5 mg via ORAL
  Filled 2024-01-05: qty 1

## 2024-01-05 NOTE — Progress Notes (Signed)
 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 Recent addition of Imuran  due to progressive decline in PFT.  She at times gets respiratory infections but generally bounces back after course of abx and steroid taper.  She has also been on steroids with decadron  with partial improvement. 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  Her PFT have also declined over last five years but FEV1 this year has been spared >80% with severe decrement in DLCO as excpected with PH and ILD.  She has a previous history of smoking for appx 40years and does have emphysematous changes overlying her UIP pattern of fibrosis. She came in to hospital due to worsening hypoxemia.   Patient states she prefers using portable potty and she would like to do that privately if possible. She's on 8L/min [place].  I have ordered metaneb chest physiotherapy.  01/05/24- patient feels better this am.  She is able to speak in full sentences. She is diuresing well on lasix  40 BID and aldactone  25mg  daily.  I have reduced her midodrine  to 5 TID. We are starting Jascayd for IPF once she will be discharged.   PAST MEDICAL HISTORY   Past Medical History:  Diagnosis Date   Arthritis    Heart murmur    Hyperlipidemia    Hypertension    Hyponatremia     IPF (idiopathic pulmonary fibrosis) (HCC)      SURGICAL HISTORY   Past Surgical History:  Procedure Laterality Date   LEFT HEART CATH AND CORONARY ANGIOGRAPHY N/A 11/27/2021   Procedure: LEFT HEART CATH AND CORONARY ANGIOGRAPHY and possible pci and stent;  Surgeon: Florencio Cara BIRCH, MD;  Location: [facility] INVASIVE CV LAB;  Service: Cardiovascular;  Laterality: N/A;   RIGHT HEART CATH Right 08/10/2023   Procedure: RIGHT HEART CATH;  Surgeon: Florencio Cara BIRCH, MD;  Location: [facility] INVASIVE CV LAB;  Service: Cardiovascular;  Laterality: Right;     FAMILY HISTORY   Family History  Problem Relation Age of Onset   Stroke Mother    Coronary artery disease Mother    Coronary artery disease Father    Stroke Father    Diabetes Father      SOCIAL HISTORY   Social History   Tobacco Use   Smoking status: Former   Smokeless tobacco: Never   Tobacco comments:    Quit 30 years ago  Substance Use Topics   Alcohol use: Never   Drug use: Never     MEDICATIONS      Current Medication:  Current Facility-Administered Medications:    acetaminophen  (TYLENOL ) tablet 650 mg, 650 mg, Oral, Q6H PRN **OR** acetaminophen  (TYLENOL ) suppository 650 mg, 650 mg, Rectal, Q6H PRN,  Cleatus Delayne GAILS, MD   albuterol  (PROVENTIL ) (2.5 MG/3ML) 0.083% nebulizer solution 2.5 mg, 2.5 mg, Nebulization, Q2H PRN, Cleatus Delayne GAILS, MD   aspirin  chewable tablet 81 mg, 81 mg, Oral, QPM, Cleatus Delayne GAILS, MD, 81 mg at 01/04/24 1730   atorvastatin  (LIPITOR) tablet 40 mg, 40 mg, Oral, Daily, Cleatus Delayne GAILS, MD, 40 mg at 01/05/24 9057   azaTHIOprine  (IMURAN ) tablet 100 mg, 100 mg, Oral, Daily, Cleatus Delayne GAILS, MD, 100 mg at 01/05/24 9056   cholecalciferol  (VITAMIN D3) 25 MCG (1000 UNIT) tablet 2,000 Units, 2,000 Units, Oral, Daily, Cleatus Delayne GAILS, MD, 2,000 Units at 01/05/24 9057   dexamethasone  (DECADRON ) tablet 1 mg, 1 mg, Oral, BID, Cleatus Delayne GAILS, MD, 1 mg at 01/05/24 9057   diazepam  (VALIUM ) tablet 2 mg, 2 mg,  Oral, QHS PRN, Mansy, Jan A, MD, 2 mg at 01/04/24 2253   enoxaparin  (LOVENOX ) injection 40 mg, 40 mg, Subcutaneous, QHS, Cleatus Delayne GAILS, MD, 40 mg at 01/04/24 2142   fluticasone  furoate-vilanterol (BREO ELLIPTA ) 100-25 MCG/ACT 1 puff, 1 puff, Inhalation, Daily, Cleatus Delayne GAILS, MD, 1 puff at 01/05/24 0945   furosemide  (LASIX ) injection 40 mg, 40 mg, Intravenous, Q12H, Cleatus Delayne GAILS, MD, 40 mg at 01/05/24 0946   guaiFENesin  (MUCINEX ) 12 hr tablet 600 mg, 600 mg, Oral, BID, Cleatus Delayne GAILS, MD, 600 mg at 01/05/24 0941   guaiFENesin -dextromethorphan  (ROBITUSSIN DM) 100-10 MG/5ML syrup 10 mL, 10 mL, Oral, Q6H PRN, Cleatus Delayne GAILS, MD   HYDROcodone -acetaminophen  (NORCO/VICODIN) 5-325 MG per tablet 1-2 tablet, 1-2 tablet, Oral, Q4H PRN, Cleatus Delayne GAILS, MD   ipratropium-albuterol  (DUONEB) 0.5-2.5 (3) MG/3ML nebulizer solution 3 mL, 3 mL, Nebulization, TID, Orlandus Borowski, MD, 3 mL at 01/05/24 0804   metoprolol tartrate (LOPRESSOR) injection 5 mg, 5 mg, Intravenous, Q6H PRN, Von Bellis, MD   midodrine  (PROAMATINE ) tablet 10 mg, 10 mg, Oral, TID PRN, Cleatus Delayne GAILS, MD   nitroGLYCERIN  (NITROSTAT ) SL tablet 0.4 mg, 0.4 mg, Sublingual, Q5 min PRN, Cleatus Delayne GAILS, MD   ondansetron  (ZOFRAN ) tablet 4 mg, 4 mg, Oral, Q6H PRN **OR** ondansetron  (ZOFRAN ) injection 4 mg, 4 mg, Intravenous, Q6H PRN, Cleatus Delayne GAILS, MD, 4 mg at 01/05/24 0946   Pirfenidone  TABS 534 mg, 534 mg, Oral, TID with meals, Von Bellis, MD, 534 mg at 01/05/24 1015   potassium chloride  SA (KLOR-CON  M) CR tablet 20 mEq, 20 mEq, Oral, Daily, Cleatus Delayne GAILS, MD, 20 mEq at 01/05/24 9057   promethazine  (PHENERGAN ) 6.25 mg/NS 50 mL IVPB, 6.25 mg, Intravenous, Q6H PRN, Cleatus Delayne GAILS, MD   spironolactone  (ALDACTONE ) tablet 25 mg, 25 mg, Oral, Daily, Cleatus Delayne V, MD, 25 mg at 01/05/24 9057   Treprostinil  POWD 16 mcg, 16 mcg, Inhalation, QID, Von Bellis, MD, 16 mcg at 01/05/24 1015    ALLERGIES   Latex, Lidocaine, Atorvastatin ,  Lasix  [furosemide ], Losartan , Metronidazole, Torsemide, and Oxycodone      REVIEW OF SYSTEMS    Review of Systems:  Gen:  Denies  fever, sweats, chills weigh loss  HEENT: Denies blurred vision, double vision, ear pain, eye pain, hearing loss, nose bleeds, sore throat Cardiac:  No dizziness, chest pain or heaviness, chest tightness,edema Resp:   reports dyspnea chronically  Gi: Denies swallowing difficulty, stomach pain, nausea or vomiting, diarrhea, constipation, bowel incontinence Gu:  Denies bladder incontinence, burning urine Ext:   Denies Joint pain, stiffness or swelling Skin: Denies  skin rash, easy bruising or bleeding or hives Endoc:  Denies polyuria, polydipsia , polyphagia  or weight change Psych:   Denies depression, insomnia or hallucinations   Other:  All other systems negative   VS: BP (!) 141/70 (BP Location: Left Arm)   Pulse 88   Temp 97.9 F (36.6 C)   Resp 18   Ht 4' 11.5 (1.511 m)   Wt 57.2 kg   SpO2 91%   BMI 25.02 kg/m      PHYSICAL EXAM    GENERAL:NAD, no fevers, chills, no weakness no fatigue HEAD: Normocephalic, atraumatic.  EYES: Pupils equal, round, reactive to light. Extraocular muscles intact. No scleral icterus.  MOUTH: Moist mucosal membrane. Dentition intact. No abscess noted.  EAR, NOSE, THROAT: Clear without exudates. No external lesions.  NECK: Supple. No thyromegaly. No nodules. No JVD.  PULMONARY: decreased breath sounds with mild rhonchi worse at bases bilaterally.  CARDIOVASCULAR: S1 and S2. Regular rate and rhythm. No murmurs, rubs, or gallops. No edema. Pedal pulses 2+ bilaterally.  GASTROINTESTINAL: Soft, nontender, nondistended. No masses. Positive bowel sounds. No hepatosplenomegaly.  MUSCULOSKELETAL: No swelling, clubbing, or edema. Range of motion full in all extremities.  NEUROLOGIC: Cranial nerves II through XII are intact. No gross focal neurological deficits. Sensation intact. Reflexes intact.  SKIN: No ulceration,  lesions, rashes, or cyanosis. Skin warm and dry. Turgor intact.  PSYCHIATRIC: Mood, affect within normal limits. The patient is awake, alert and oriented x 3. Insight, judgment intact.       IMAGING   Narrative & Impression  CLINICAL DATA:  Acute on chronic hypoxia, shortness of breath   EXAM: CT ANGIOGRAPHY CHEST WITH CONTRAST   TECHNIQUE: Multidetector CT imaging of the chest was performed using the standard protocol during bolus administration of intravenous contrast. Multiplanar CT image reconstructions and MIPs were obtained to evaluate the vascular anatomy.   RADIATION DOSE REDUCTION: This exam was performed according to the departmental dose-optimization program which includes automated exposure control, adjustment of the mA and/or kV according to patient size and/or use of iterative reconstruction technique.   CONTRAST:  75mL OMNIPAQUE  IOHEXOL  350 MG/ML SOLN   COMPARISON:  01/03/2024, 11/11/2023   FINDINGS: Cardiovascular: This is a technically adequate evaluation of the pulmonary vasculature. There are no filling defects or pulmonary emboli. Marked dilation of the pulmonary artery is again noted consistent with pulmonary arterial hypertension.   The heart is enlarged, with prominent right ventricular dilatation again noted. No evidence of thoracic aortic aneurysm or dissection. Atherosclerosis of the aorta and coronary vasculature.   Mediastinum/Nodes: No enlarged mediastinal, hilar, or axillary lymph nodes. Thyroid gland, trachea, and esophagus demonstrate no significant findings.   Lungs/Pleura: There is background emphysema and fibrosis, with superimposed interlobular septal thickening and scattered bibasilar ground-glass airspace disease compatible with developing pulmonary edema. No effusion or pneumothorax. The central airways are patent.   Upper Abdomen: No acute abnormality.   Musculoskeletal: No acute or destructive bony  abnormalities. Reconstructed images demonstrate no additional findings.   Review of the MIP images confirms the above findings.   IMPRESSION: 1. No evidence of pulmonary embolus. 2. Findings consistent with congestive heart failure and mild pulmonary edema, superimposed upon background emphysema and fibrosis. 3. Stable cardiomegaly, with dilated pulmonary arteries consistent with pulmonary arterial hypertension. 4. Aortic Atherosclerosis (ICD10-I70.0) and Emphysema (ICD10-J43.9).     Electronically Signed   By: Ozell Daring M.D.   On: 01/03/2024 18:37    ASSESSMENT/PLAN   Acute on chronic hypoxemic respiratory failure       Currently negative for viral LRTI due to negative RVP and SARS/FLU/RSV  panel      - inflammatory workup -ddimer, CRP      - VBG today      - 0 Result Notes    Component Ref Range & Units (hover) 1 d ago  Pro Brain Natriuretic Peptide 8,113.0 High          Idiopathic pulmonary fibrosis with acute exacerbation    - advanced and progressed over past 5 years    - continue decadron  2mg  bid    Centrilobular emphysema -no signs of acute exacerbation of COPD  - viral workup and inflammatory biomarkers are negative   - duoneb prn is sufficient at this time     Pulmonary hypertension associated with pulmonary fibrosis    - will dc tyvasso DPI at home with poor tolerance will switch to [place] going forward  -pre and post capillary per RHC done this year   -PT/OT ordered   Bibasilar atelectasis   - recruitement maneuvers via chest physiotherapy utilizing metaneb with duoneb     - PT /OT  Physical deconditioning   Continue PT/OT , continue chest PT  BODE score >8 with poor long term prognosis   Goals of care     - patient is currently DNR      Thank you for allowing me to participate in the care of this patient.   Patient/Family are satisfied with care plan and all questions have been answered.    Provider disclosure: Patient with  at least one acute or chronic illness or injury that poses a threat to life or bodily function and is being managed actively during this encounter.  All of the below services have been performed independently by signing provider:  review of prior documentation from internal and or external health records.  Review of previous and current lab results.  Interview and comprehensive assessment during patient visit today. Review of current and previous chest radiographs/CT scans. Discussion of management and test interpretation with health care team and patient/family.   This document was prepared using Dragon voice recognition software and may include unintentional dictation errors.     Korynne Dols, M.D.  Division of Pulmonary & Critical Care Medicine

## 2024-01-05 NOTE — Progress Notes (Signed)
 [facility] Progress Note  Patient: Susan Sosa    FMW:[id number]  DOA: 01/03/2024     Date of Service: the patient was seen and examined on 01/05/2024  Chief Complaint  Patient presents with   Shortness of Breath   Brief hospital course: Susan Sosa is a 83 y.o. 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 She presents with acutely worsening shortness of breath starting on the morning of arrival with O2 sats in the 70s at home on her base flow rate of 8 L. In the ED, she was tachycardic and tachypneic with O2 sats 73% on 6 L improving to 100% on 8 L.Venous pH was unremarkable, troponin 38-37 and proBNP 8113.  CMP and CBC are at baseline. EKG shows sinus tachycardia at 115 with nonspecific T wave abnormalities CTA chest negative for PE but showing CHF and pulmonary edema. Patient treated with Lasix  and DuoNeb Admission requested   Assessment and Plan:  # Acute on chronic systolic CHF (congestive heart failure) (HCC) Severe pulmonary hypertension, possible acute cor pulmonale Nonischemic cardiomyopathy Acute on chronic respiratory failure with hypoxia Patient presents with acute onset shortness of breath with increased O2 requirement in spite of outpatient intensification of diuretic regimen Clinically fluid overloaded on exam, proBNP 8113 and CTA chest showing pulmonary edema Continue Lasix  40 mg IV twice daily Continue GDMT with spironolactone , losartan  Holding salt tablet for salt restriction History of HFrEF with improved EF (35% 2023-> 55% 09/2023)  Daily weights  and intake and output monitoring Pulmonary consult: Recommended MetaNeb with DuoNeb due to bibasilar atelectasis. RVP panel negative   Pulmonary hypertension, severe (HCC) Idiopathic pulmonary fibrosis-on O2 at 8 L at baseline Continue treprostinil  Continue azathioprine  and dexamethasone  Continue Mucinex  600 mg p.o. 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      Body mass index is 25.02 kg/m.  Interventions:  Diet: Heart healthy DVT Prophylaxis: Subcutaneous Lovenox    Advance goals of care discussion: DNR-limited  Family Communication: family was present at bedside, at the time of interview.  The pt provided permission to discuss medical plan with the family. Opportunity was given to ask question and all questions were answered satisfactorily.   Disposition:  Pt is from home, admitted with respiratory failure, still has SOB, which precludes a safe discharge. Discharge to home, when, may need 1-2 more days to improve.  Subjective: No significant events overnight.  No significant events overnight, patient was resting calmly in the bed.  Patient was using MetaNeb with DuoNeb, denied any worsening of symptoms, feeling little bit improvement.   Physical Exam: General: NAD, lying comfortably Appear in no distress, affect appropriate Eyes: PERRLA ENT: Oral Mucosa Clear, moist  Neck: no JVD,  Cardiovascular: S1 and S2 Present, no Murmur,  Respiratory: Equal air entry bilaterally,  b/l crackles, no significant  wheezes  Abdomen: Bowel Sound present, Soft and no tenderness,  Skin: no rashes Extremities: no Pedal edema, no calf tenderness Neurologic: without any new focal findings Gait not checked due to patient safety concerns  Vitals:   01/05/24 0526 01/05/24 0856 01/05/24 1327 01/05/24 1654  BP: 137/80 (!) 141/70 (!) 89/53 95/60  Pulse: 88 88 96 89  Resp: 16 18 16 18   Temp: 97.6 F (36.4 C) 97.9 F (36.6 C) 98.4 F (36.9 C) (!) 97.5 F (36.4 C)  TempSrc:      SpO2: 92% 91% 92% 93%  Weight: 57.2 kg     Height:        Intake/Output Summary (Last 24 hours) at 01/05/2024 1707 Last data filed at 01/05/2024 1046 Gross per 24 hour  Intake 270 ml  Output 550 ml  Net -280 ml   Filed Weights   01/03/24 1330 01/05/24 0526  Weight: 57.2 kg 57.2 kg    Data Reviewed: I have personally reviewed and interpreted daily labs, tele strips, imagings as discussed above. I reviewed all nursing notes, pharmacy notes, vitals, pertinent old records I have discussed plan of care as described above with RN and patient/family.  CBC: Recent Labs  Lab 01/03/24 1355 01/04/24 0422 01/05/24 0503  WBC 9.2 8.7 8.2  NEUTROABS 8.4*  --   --   HGB 11.6* 11.2* 12.4  HCT 34.9* 34.2* 38.5  MCV 93.1 92.9 92.3  PLT 340 331 376   Basic Metabolic Panel: Recent Labs  Lab 01/03/24 1355 01/04/24 0422 01/05/24 0503  NA 134* 134* 135  K 4.9 4.7 4.2  CL 95* 94* 92*  CO2 25 28 30   GLUCOSE 171* 122* 93  BUN 14 13 9   CREATININE 0.91 1.02* 0.97  CALCIUM  8.8* 8.8* 9.1  MG  --  1.8 1.8  PHOS  --  4.8* 4.8*    Studies: No results found.   Scheduled Meds:  aspirin   81 mg Oral QPM   atorvastatin   40 mg Oral Daily   azaTHIOprine   100 mg Oral Daily   cholecalciferol   2,000 Units Oral Daily   [START ON 01/06/2024] dexamethasone   1 mg Oral Daily   enoxaparin  (LOVENOX ) injection  40 mg Subcutaneous QHS   fluticasone  furoate-vilanterol  1 puff Inhalation Daily   furosemide   40 mg Intravenous Q12H    guaiFENesin   600 mg Oral BID   ipratropium-albuterol   3 mL Nebulization TID   Pirfenidone   534 mg Oral TID with meals   potassium chloride  SA  20 mEq Oral Daily   spironolactone   25 mg Oral Daily   Treprostinil   16 mcg Inhalation QID   Continuous Infusions:  promethazine  (PHENERGAN ) injection (IM or IVPB)     PRN Meds: acetaminophen  **OR** acetaminophen , albuterol , diazepam , guaiFENesin -dextromethorphan , HYDROcodone -acetaminophen , metoprolol tartrate, midodrine , nitroGLYCERIN , ondansetron  **OR** ondansetron  (ZOFRAN ) IV, promethazine  (PHENERGAN ) injection (IM or IVPB)  Time spent: 35 minutes  Author: ELVAN SOR. MD [facility] 01/05/2024 5:07 PM  To reach On-call, see care teams to locate the attending and reach out to them via [email or web address]. If 7PM-7AM, please contact night-coverage If you still have difficulty reaching the attending provider, please page the [facility] (Director on Call) for [facility] on amion for assistance.

## 2024-01-05 NOTE — Plan of Care (Signed)

## 2024-01-05 NOTE — Progress Notes (Signed)
 Heart Failure Navigator Progress Note  Assessed for Heart & Vascular TOC clinic readiness.  Patient does not meet criteria due to current [facility] Cardiology patient.   Navigator will sign off at this time.  Roxy Horseman, RN, BSN [facility] Heart Failure Navigator Secure Chat Only

## 2024-01-06 ENCOUNTER — Inpatient Hospital Stay

## 2024-01-06 MED ORDER — FUROSEMIDE 10 MG/ML IJ SOLN
40.0000 mg | Freq: Every day | INTRAMUSCULAR | Status: DC
  Administered 2024-01-07: 40 mg via INTRAVENOUS
  Filled 2024-01-06: qty 4

## 2024-01-06 MED ORDER — IPRATROPIUM-ALBUTEROL 0.5-2.5 (3) MG/3ML IN SOLN
3.0000 mL | Freq: Two times a day (BID) | RESPIRATORY_TRACT | Status: DC
  Administered 2024-01-06 – 2024-01-07 (×2): 3 mL via RESPIRATORY_TRACT
  Filled 2024-01-06 (×2): qty 3

## 2024-01-06 NOTE — Progress Notes (Signed)
 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 Recent addition of Imuran  due to progressive decline in PFT.  She at times gets respiratory infections but generally bounces back after course of abx and steroid taper.  She has also been on steroids with decadron  with partial improvement. 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  Her PFT have also declined over last five years but FEV1 this year has been spared >80% with severe decrement in DLCO as excpected with PH and ILD.  She has a previous history of smoking for appx 40years and does have emphysematous changes overlying her UIP pattern of fibrosis. She came in to hospital due to worsening hypoxemia.   Patient states she prefers using portable potty and she would like to do that privately if possible. She's on 8L/min [place].  I have ordered metaneb chest physiotherapy.  01/05/24- patient feels better this am.  She is able to speak in full sentences. She is diuresing well on lasix  40 BID and aldactone  25mg  daily.  I have reduced her midodrine  to 5 TID. We are starting Jascayd for IPF once she will be discharged.  01/06/24- patient is stable on 9L/min [place].  She has normal CBC with improved hb.  Renal function is stable. She uses metaneb chest physiotherapy.   Optimizing for dc home.  Viral testing is normal.   PAST MEDICAL HISTORY   Past Medical History:  Diagnosis Date   Arthritis    Heart murmur    Hyperlipidemia    Hypertension    Hyponatremia    IPF (idiopathic pulmonary fibrosis) (HCC)      SURGICAL HISTORY   Past Surgical History:  Procedure Laterality Date   LEFT HEART CATH AND CORONARY ANGIOGRAPHY N/A 11/27/2021   Procedure: LEFT HEART CATH AND CORONARY ANGIOGRAPHY and possible pci and stent;  Surgeon: Florencio Cara BIRCH, MD;  Location: [facility] INVASIVE CV LAB;  Service: Cardiovascular;  Laterality: N/A;   RIGHT HEART CATH Right 08/10/2023   Procedure: RIGHT HEART CATH;  Surgeon: Florencio Cara BIRCH, MD;  Location: [facility] INVASIVE CV LAB;  Service: Cardiovascular;  Laterality: Right;     FAMILY HISTORY   Family History  Problem Relation Age of Onset   Stroke Mother    Coronary artery disease Mother    Coronary artery disease Father    Stroke Father    Diabetes Father      SOCIAL HISTORY   Social History   Tobacco Use   Smoking status: Former   Smokeless tobacco: Never   Tobacco comments:    Quit 30 years ago  Substance Use Topics   Alcohol use: Never   Drug use: Never  MEDICATIONS      Current Medication:  Current Facility-Administered Medications:    acetaminophen  (TYLENOL ) tablet 650 mg, 650 mg, Oral, Q6H PRN **OR** acetaminophen  (TYLENOL ) suppository 650 mg, 650 mg, Rectal, Q6H PRN, Cleatus Delayne GAILS, MD   albuterol  (PROVENTIL ) (2.5 MG/3ML) 0.083% nebulizer solution 2.5 mg, 2.5 mg, Nebulization, Q2H PRN, Cleatus Delayne GAILS, MD   aspirin  chewable tablet 81 mg, 81 mg, Oral, QPM, Cleatus Delayne GAILS, MD, 81 mg at 01/05/24 1732   atorvastatin  (LIPITOR) tablet 40 mg, 40 mg, Oral, Daily, Cleatus Delayne GAILS, MD, 40 mg at 01/06/24 9077   azaTHIOprine  (IMURAN ) tablet 100 mg, 100 mg, Oral, Daily, Cleatus Delayne V, MD, 100 mg at 01/05/24 9056   cholecalciferol  (VITAMIN D3) 25 MCG (1000 UNIT) tablet 2,000 Units, 2,000 Units,  Oral, Daily, Cleatus Delayne GAILS, MD, 2,000 Units at 01/06/24 9076   dexamethasone  (DECADRON ) tablet 1 mg, 1 mg, Oral, Daily, Nessie Nong, MD   diazepam  (VALIUM ) tablet 2 mg, 2 mg, Oral, QHS PRN, Mansy, Jan A, MD, 2 mg at 01/04/24 2253   enoxaparin  (LOVENOX ) injection 40 mg, 40 mg, Subcutaneous, QHS, Cleatus Delayne GAILS, MD, 40 mg at 01/05/24 2132   fluticasone  furoate-vilanterol (BREO ELLIPTA ) 100-25 MCG/ACT 1 puff, 1 puff, Inhalation, Daily, Cleatus Delayne V, MD, 1 puff at 01/05/24 0945   furosemide  (LASIX ) injection 40 mg, 40 mg, Intravenous, Q12H, Cleatus Delayne GAILS, MD, 40 mg at 01/05/24 2135   guaiFENesin  (MUCINEX ) 12 hr tablet 600 mg, 600 mg, Oral, BID, Cleatus Delayne GAILS, MD, 600 mg at 01/06/24 9077   guaiFENesin -dextromethorphan  (ROBITUSSIN DM) 100-10 MG/5ML syrup 10 mL, 10 mL, Oral, Q6H PRN, Cleatus Delayne GAILS, MD   HYDROcodone -acetaminophen  (NORCO/VICODIN) 5-325 MG per tablet 1-2 tablet, 1-2 tablet, Oral, Q4H PRN, Cleatus Delayne GAILS, MD   ipratropium-albuterol  (DUONEB) 0.5-2.5 (3) MG/3ML nebulizer solution 3 mL, 3 mL, Nebulization, TID, Malayjah Otoole, MD, 3 mL at 01/06/24 0752   metoprolol tartrate (LOPRESSOR) injection 5 mg, 5 mg, Intravenous, Q6H PRN, Von Bellis, MD   midodrine  (PROAMATINE ) tablet 5 mg, 5 mg, Oral, TID PRN, Nikcole Eischeid, MD, 5 mg at 01/05/24 1403   nitroGLYCERIN  (NITROSTAT ) SL tablet 0.4 mg, 0.4 mg, Sublingual, Q5 min PRN, Cleatus Delayne GAILS, MD   ondansetron  (ZOFRAN ) tablet 4 mg, 4 mg, Oral, Q6H PRN **OR** ondansetron  (ZOFRAN ) injection 4 mg, 4 mg, Intravenous, Q6H PRN, Cleatus Delayne GAILS, MD, 4 mg at 01/06/24 9068   Pirfenidone  TABS 534 mg, 534 mg, Oral, TID with meals, Von Bellis, MD, 534 mg at 01/05/24 1731   potassium chloride  SA (KLOR-CON  M) CR tablet 20 mEq, 20 mEq, Oral, Daily, Cleatus Delayne GAILS, MD, 20 mEq at 01/06/24 9076   promethazine  (PHENERGAN ) 6.25 mg/NS 50 mL IVPB, 6.25 mg, Intravenous, Q6H PRN, Cleatus Delayne GAILS, MD   spironolactone  (ALDACTONE ) tablet 25 mg, 25 mg,  Oral, Daily, Cleatus Delayne V, MD, 25 mg at 01/06/24 9076   Treprostinil  POWD 16 mcg, 16 mcg, Inhalation, QID, Von Bellis, MD, 16 mcg at 01/05/24 2135    ALLERGIES   Latex, Lidocaine, Atorvastatin , Lasix  [furosemide ], Losartan , Metronidazole, Torsemide, and Oxycodone      REVIEW OF SYSTEMS    Review of Systems:  Gen:  Denies  fever, sweats, chills weigh loss  HEENT: Denies blurred vision, double vision, ear pain, eye pain, hearing loss, nose bleeds, sore throat Cardiac:  No dizziness, chest pain or heaviness, chest tightness,edema Resp:   reports dyspnea chronically  Gi: Denies swallowing difficulty, stomach pain, nausea or vomiting, diarrhea, constipation, bowel incontinence  Gu:  Denies bladder incontinence, burning urine Ext:   Denies Joint pain, stiffness or swelling Skin: Denies  skin rash, easy bruising or bleeding or hives Endoc:  Denies polyuria, polydipsia , polyphagia or weight change Psych:   Denies depression, insomnia or hallucinations   Other:  All other systems negative   VS: BP 115/81 (BP Location: Left Arm)   Pulse 94   Temp 98 F (36.7 C)   Resp 18   Ht 4' 11.5 (1.511 m)   Wt 56.4 kg   SpO2 95%   BMI 24.69 kg/m      PHYSICAL EXAM    GENERAL:NAD, no fevers, chills, no weakness no fatigue HEAD: Normocephalic, atraumatic.  EYES: Pupils equal, round, reactive to light. Extraocular muscles intact. No scleral icterus.  MOUTH: Moist mucosal membrane. Dentition intact. No abscess noted.  EAR, NOSE, THROAT: Clear without exudates. No external lesions.  NECK: Supple. No thyromegaly. No nodules. No JVD.  PULMONARY: decreased breath sounds with mild rhonchi worse at bases bilaterally.  CARDIOVASCULAR: S1 and S2. Regular rate and rhythm. No murmurs, rubs, or gallops. No edema. Pedal pulses 2+ bilaterally.  GASTROINTESTINAL: Soft, nontender, nondistended. No masses. Positive bowel sounds. No hepatosplenomegaly.  MUSCULOSKELETAL: No swelling, clubbing, or  edema. Range of motion full in all extremities.  NEUROLOGIC: Cranial nerves II through XII are intact. No gross focal neurological deficits. Sensation intact. Reflexes intact.  SKIN: No ulceration, lesions, rashes, or cyanosis. Skin warm and dry. Turgor intact.  PSYCHIATRIC: Mood, affect within normal limits. The patient is awake, alert and oriented x 3. Insight, judgment intact.       IMAGING   Narrative & Impression  CLINICAL DATA:  Acute on chronic hypoxia, shortness of breath   EXAM: CT ANGIOGRAPHY CHEST WITH CONTRAST   TECHNIQUE: Multidetector CT imaging of the chest was performed using the standard protocol during bolus administration of intravenous contrast. Multiplanar CT image reconstructions and MIPs were obtained to evaluate the vascular anatomy.   RADIATION DOSE REDUCTION: This exam was performed according to the departmental dose-optimization program which includes automated exposure control, adjustment of the mA and/or kV according to patient size and/or use of iterative reconstruction technique.   CONTRAST:  75mL OMNIPAQUE  IOHEXOL  350 MG/ML SOLN   COMPARISON:  01/03/2024, 11/11/2023   FINDINGS: Cardiovascular: This is a technically adequate evaluation of the pulmonary vasculature. There are no filling defects or pulmonary emboli. Marked dilation of the pulmonary artery is again noted consistent with pulmonary arterial hypertension.   The heart is enlarged, with prominent right ventricular dilatation again noted. No evidence of thoracic aortic aneurysm or dissection. Atherosclerosis of the aorta and coronary vasculature.   Mediastinum/Nodes: No enlarged mediastinal, hilar, or axillary lymph nodes. Thyroid gland, trachea, and esophagus demonstrate no significant findings.   Lungs/Pleura: There is background emphysema and fibrosis, with superimposed interlobular septal thickening and scattered bibasilar ground-glass airspace disease compatible with  developing pulmonary edema. No effusion or pneumothorax. The central airways are patent.   Upper Abdomen: No acute abnormality.   Musculoskeletal: No acute or destructive bony abnormalities. Reconstructed images demonstrate no additional findings.   Review of the MIP images confirms the above findings.   IMPRESSION: 1. No evidence of pulmonary embolus. 2. Findings consistent with congestive heart failure and mild pulmonary edema, superimposed upon background emphysema and fibrosis. 3. Stable cardiomegaly, with dilated pulmonary arteries consistent with pulmonary arterial hypertension. 4. Aortic Atherosclerosis (ICD10-I70.0) and Emphysema (ICD10-J43.9).     Electronically Signed   By: Ozell Delores HERO.D.  On: 01/03/2024 18:37    ASSESSMENT/PLAN   Acute on chronic hypoxemic respiratory failure       Currently negative for viral LRTI due to negative RVP and SARS/FLU/RSV panel      - inflammatory workup -ddimer, CRP      - VBG today      - 0 Result Notes    Component Ref Range & Units (hover) 1 d ago  Pro Brain Natriuretic Peptide 8,113.0 High          Idiopathic pulmonary fibrosis with acute exacerbation    - advanced and progressed over past 5 years    - continue decadron  2mg  bid    Centrilobular emphysema -no signs of acute exacerbation of COPD  - viral workup and inflammatory biomarkers are negative   - duoneb prn is sufficient at this time     Pulmonary hypertension associated with pulmonary fibrosis    - will dc tyvasso DPI at home with poor tolerance will switch to [place] going forward  -pre and post capillary per RHC done this year   -PT/OT ordered   Bibasilar atelectasis   - recruitement maneuvers via chest physiotherapy utilizing metaneb with duoneb     - PT /OT  Physical deconditioning   Continue PT/OT , continue chest PT  BODE score >8 with poor long term prognosis   Goals of care     - patient is currently DNR      Thank you for  allowing me to participate in the care of this patient.   Patient/Family are satisfied with care plan and all questions have been answered.    Provider disclosure: Patient with at least one acute or chronic illness or injury that poses a threat to life or bodily function and is being managed actively during this encounter.  All of the below services have been performed independently by signing provider:  review of prior documentation from internal and or external health records.  Review of previous and current lab results.  Interview and comprehensive assessment during patient visit today. Review of current and previous chest radiographs/CT scans. Discussion of management and test interpretation with health care team and patient/family.   This document was prepared using Dragon voice recognition software and may include unintentional dictation errors.     Nagi Furio, M.D.  Division of Pulmonary & Critical Care Medicine

## 2024-01-06 NOTE — TOC Initial Note (Signed)
 Transition of Care [facility]) - Initial/Assessment Note    Patient Details  Name: Susan Sosa MRN: [id number] Date of Birth: 09/27/1940  Transition [facility]) CM/SW Contact:    Susan JAYSON Carpen, LCSW Phone Number: 01/06/2024, 10:44 AM  Clinical Narrative:  Readmission prevention screen complete. CSW met with patient. Husband at bedside. CSW introduced role and explained that discharge planning would be discussed. PCP is Dr. Alla. Husband drives her to appointments. [facility] [address] No issues affording medications. Patient lives home with husband and her dog. She is active with [facility] for PT, OT, RN. She has a RW and oxygen at home. She uses 8 L chronic through [employer]. No further concerns. CSW will continue to follow patient and her husband for support and facilitate return home once stable. Husband will transport her home at discharge and will have her oxygen for the ride.                Expected Discharge Plan: Home w Home Health Services Barriers to Discharge: Continued Medical Work up   Patient Goals and CMS Choice            Expected Discharge Plan and Services     Post Acute Care Choice: Resumption of Svcs/PTA Provider Living arrangements for the past 2 months: Single Family Home                           HH Arranged: RN, PT, OT [facility] Agency: [facility] Date [facility] Agency Contacted: 01/06/24   Representative spoke with at [facility] Agency: [place]   Prior Living Arrangements/Services Living arrangements for the past 2 months: Single Family Home Lives with:: Spouse, Pets Patient language and need for interpreter reviewed:: Yes Do you feel safe going back to the place where you live?: Yes      Need for Family Participation in Patient Care: Yes (Comment) Care giver support system in place?: Yes (comment) Current home services: DME, Home OT, Home PT, Home RN Criminal Activity/Legal Involvement Pertinent to Current  Situation/Hospitalization: No - Comment as needed  Activities of Daily Living   ADL Screening (condition at time of admission) Independently performs ADLs?: Yes (appropriate for developmental age) Is the patient deaf or have difficulty hearing?: No Does the patient have difficulty seeing, even when wearing glasses/contacts?: No Does the patient have difficulty concentrating, remembering, or making decisions?: No  Permission Sought/Granted Permission sought to share information with : Facility Medical Sales Representative, Family Supports Permission granted to share information with : Yes, Verbal Permission Granted  Share Information with NAME: Susan Sosa  Permission granted to share info w AGENCY: [facility]  Permission granted to share info w Relationship: Husband  Permission granted to share info w Contact Information: [phone]  Emotional Assessment Appearance:: Appears stated age Attitude/Demeanor/Rapport: Engaged, Gracious Affect (typically observed): Accepting, Appropriate, Calm, Pleasant Orientation: : Oriented to Self, Oriented to Place, Oriented to  Time, Oriented to Situation Alcohol / Substance Use: Not Applicable Psych Involvement: No (comment)  Admission diagnosis:  Acute pulmonary edema (HCC) [J81.0] Acute on chronic respiratory failure with hypoxia (HCC) [J96.21] Patient Active Problem List   Diagnosis Date Noted   Acute pulmonary edema (HCC) 01/04/2024   Acute on chronic systolic CHF (congestive heart failure) (HCC) 01/03/2024   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 01/03/2024   Coronary artery disease, nonobstructive with history of NSTEMI 11/2023 01/03/2024   Pulmonary hypertension, severe (HCC) 01/03/2024   Acute on chronic  respiratory failure (HCC) 11/09/2023   Sinus tachycardia 11/09/2023   Elevated troponin 11/09/2023   AKI (acute kidney injury) 11/09/2023   Acute on chronic respiratory failure with hypoxemia (HCC) 10/14/2023   Fall 05/24/2022    NICM (nonischemic cardiomyopathy) (HCC) 05/24/2022   Forehead laceration, initial encounter 05/24/2022   Facial trauma 05/24/2022   NSTEMI (non-ST elevated myocardial infarction) (HCC) 11/26/2021   Chronic respiratory failure with hypoxia (HCC) 11/26/2021   Chronic hyponatremia 11/26/2021   Degeneration of lumbosacral intervertebral disc 08/04/2020   Full thickness rotator cuff tear 08/04/2020   Osteoarthritis of knee 08/04/2020   Systolic murmur 09/04/2019   Idiopathic pulmonary fibrosis (HCC) 02/26/2019   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   Intermediate stage nonexudative age-related macular degeneration of both eyes 10/21/2017   Posterior vitreous detachment of left eye 10/21/2017   Presence of intraocular lens 10/21/2017   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   Hypomagnesemia 09/07/2017   Prediabetes 04/18/2016   Bilateral renal cysts 07/18/2014   Paroxysmal atrial tachycardia 07/18/2014   Dyspnea 08/10/2011   Hypercholesterolemia 08/10/2011   PCP:  Alla Amis, MD Pharmacy:   [phone] [address], [place] - [address] [place] [address] Phone: [phone] Fax: [phone]  [facility] [address] [place] [address] Phone: [phone] Fax: [phone]     Social Drivers of Health (SDOH) Social History: SDOH Screenings   Food Insecurity: No Food Insecurity (01/04/2024)  Housing: Low Risk (01/04/2024)  Transportation Needs: No Transportation Needs (01/04/2024)  Utilities: Not At Risk (01/04/2024)  Financial Resource Strain: Low Risk  (12/28/2023)   Received from [facility]  Social Connections: Socially Integrated (01/04/2024)  Tobacco Use: Medium Risk (12/28/2023)   Received from [facility]   SDOH Interventions:     Readmission Risk Interventions    01/06/2024   10:40 AM 11/11/2023   10:49 AM  Readmission Risk  Prevention Plan  Transportation Screening Complete Complete  PCP or Specialist Appt within 3-5 Days Complete Complete  HRI or Home Care Consult Complete   Social Work Consult for Recovery Care Planning/Counseling Complete Complete  Palliative Care Screening Not Applicable Not Applicable  Medication Review Oceanographer) Complete Complete

## 2024-01-06 NOTE — Progress Notes (Signed)
 [facility] Progress Note  Patient: Susan Sosa    FMW:[id number]  DOA: 01/03/2024     Date of Service: the patient was seen and examined on 01/06/2024  Chief Complaint  Patient presents with   Shortness of Breath   Brief hospital course: Susan Sosa is a 83 y.o. 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 She presents with acutely worsening shortness of breath starting on the morning of arrival with O2 sats in the 70s at home on her base flow rate of 8 L. In the ED, she was tachycardic and tachypneic with O2 sats 73% on 6 L improving to 100% on 8 L.Venous pH was unremarkable, troponin 38-37 and proBNP 8113.  CMP and CBC are at baseline. EKG shows sinus tachycardia at 115 with nonspecific T wave abnormalities CTA chest negative for PE but showing CHF and pulmonary edema. Patient treated with Lasix  and DuoNeb Admission requested   Assessment and Plan:  # Acute on chronic systolic CHF (congestive heart failure)  Severe pulmonary hypertension, possible acute cor pulmonale Nonischemic cardiomyopathy Acute on chronic respiratory failure with hypoxia Patient presents with acute onset shortness of breath with increased O2 requirement in spite of outpatient intensification of diuretic regimen Clinically fluid overloaded on exam, proBNP 8113 and CTA chest showing pulmonary edema S/p Lasix  40 mg IV BID, decreased to daily Continue GDMT with spironolactone , losartan  Holding salt tablet for salt restriction History of HFrEF with improved EF (35% 2023-> 55% 09/2023)  Daily  weights and intake and output monitoring Pulmonary consult: Recommended MetaNeb with DuoNeb due to bibasilar atelectasis. RVP panel negative   Pulmonary hypertension, severe (HCC) Idiopathic pulmonary fibrosis-on O2 at 8 L at baseline Continue treprostinil  Continue azathioprine  and dexamethasone  Continue Mucinex  600 mg p.o. 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      Body mass index is 24.69 kg/m.  Interventions:  Diet: Heart healthy DVT Prophylaxis: Subcutaneous Lovenox    Advance goals of care discussion: DNR-limited  Family Communication: family was present at bedside, at the time of interview.  The pt provided permission to discuss medical plan with the family. Opportunity was given to ask question and all questions were answered satisfactorily.   Disposition:  Pt is from home, admitted with respiratory failure, still has SOB, which precludes a safe discharge. Discharge to home, when stable, most likely tomorrow a.m.  Subjective: No significant events overnight.  No significant events overnight.  Patient was sitting covertly in the recliner, patient did ambulate and her O2 sats dropped to 87%. We will continue to monitor today and plan for discharge tomorrow a.m.    Physical Exam: General: NAD, lying comfortably Appear in no distress, affect appropriate Eyes: PERRLA ENT: Oral Mucosa Clear, moist  Neck: no JVD,  Cardiovascular: S1 and S2 Present,  no Murmur,  Respiratory: Equal air entry  bilaterally, b/l crackles, no significant wheezes  Abdomen: Bowel Sound present, Soft and no tenderness,  Skin: no rashes Extremities: no Pedal edema, no calf tenderness Neurologic: without any new focal findings Gait not checked due to patient safety concerns  Vitals:   01/06/24 0524 01/06/24 0753 01/06/24 1248 01/06/24 1430  BP:  115/81 110/64 (!) 92/49  Pulse:  94 90 89  Resp:  18 18 18   Temp:  98 F (36.7 C) 97.9 F (36.6 C) 98.2 F (36.8 C)  TempSrc:      SpO2:  95% 95% 97%  Weight: 56.4 kg     Height:        Intake/Output Summary (Last 24 hours) at 01/06/2024 1655 Last data filed at 01/06/2024 1425 Gross per 24 hour  Intake 750 ml  Output 850 ml  Net -100 ml   Filed Weights   01/03/24 1330 01/05/24 0526 01/06/24 0524  Weight: 57.2 kg 57.2 kg 56.4 kg    Data Reviewed: I have personally reviewed and interpreted daily labs, tele strips, imagings as discussed above. I reviewed all nursing notes, pharmacy notes, vitals, pertinent old records I have discussed plan of care as described above with RN and patient/family.  CBC: Recent Labs  Lab 01/03/24 1355 01/04/24 0422 01/05/24 0503  WBC 9.2 8.7 8.2  NEUTROABS 8.4*  --   --   HGB 11.6* 11.2* 12.4  HCT 34.9* 34.2* 38.5  MCV 93.1 92.9 92.3  PLT 340 331 376   Basic Metabolic Panel: Recent Labs  Lab 01/03/24 1355 01/04/24 0422 01/05/24 0503  NA 134* 134* 135  K 4.9 4.7 4.2  CL 95* 94* 92*  CO2 25 28 30   GLUCOSE 171* 122* 93  BUN 14 13 9   CREATININE 0.91 1.02* 0.97  CALCIUM  8.8* 8.8* 9.1  MG  --  1.8 1.8  PHOS  --  4.8* 4.8*    Studies: No results found.   Scheduled Meds:  aspirin   81 mg Oral QPM   atorvastatin   40 mg Oral Daily   azaTHIOprine   100 mg Oral Daily   cholecalciferol   2,000 Units Oral Daily   dexamethasone   1 mg Oral Daily   enoxaparin  (LOVENOX ) injection  40 mg Subcutaneous QHS   fluticasone  furoate-vilanterol  1 puff Inhalation Daily   furosemide   40 mg Intravenous Q12H    guaiFENesin   600 mg Oral BID   ipratropium-albuterol   3 mL Nebulization BID   Pirfenidone   534 mg Oral TID with meals   potassium chloride  SA  20 mEq Oral Daily   spironolactone   25 mg Oral Daily   Treprostinil   16 mcg Inhalation QID   Continuous Infusions:  promethazine  (PHENERGAN ) injection (IM or IVPB)     PRN Meds: acetaminophen  **OR** acetaminophen , albuterol , diazepam , guaiFENesin -dextromethorphan , HYDROcodone -acetaminophen , metoprolol tartrate, midodrine , nitroGLYCERIN , ondansetron  **OR** ondansetron  (ZOFRAN ) IV, promethazine  (PHENERGAN ) injection (IM or IVPB)  Time spent: 35 minutes  Author: ELVAN SOR. MD [facility] 01/06/2024 4:55 PM  To reach On-call, see care teams to locate the attending and reach out to them via [email or web address]. If 7PM-7AM, please contact night-coverage If you still have difficulty reaching the attending provider, please page the [facility] (Director on Call) for [facility] on amion for assistance.

## 2024-01-06 NOTE — Care Management Important Message (Signed)
 Important Message  Patient Details  Name: Nonie Lochner MRN: [id number] Date of Birth: 1940/10/21   Important Message Given:  Yes - Medicare IM     Duquan Gillooly W, CMA 01/06/2024, 2:12 PM

## 2024-01-06 NOTE — Evaluation (Signed)
 Physical Therapy Evaluation Patient Details Name: Susan Sosa MRN: [id number] DOB: 1940-08-19 Today's Date: 01/06/2024  History of Present Illness  Pt is a 83 y.o. 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 MD assessment includes: acute on chronic systolic CHF, severe pulm HTN, possible acure cor pulmonale, nonischemic cardiomyopathy, acute on chronic resp failure with hypoxia.  Clinical Impression  Pt was found in bed on arrival with spouse present. She was pleasant and motivated to participate during the session and put forth good effort throughout. Pts SpO2 was monitored on 8L [place] (see details below) and HR WNL. Pt was able to ambulate 60 ft and required CGA for transfers and ambulation (see details below) no LOB, no knee buckling. Pt was left as found with spouse present. Pt will benefit from continued PT services upon discharge to safely address deficits listed in patient problem list for decreased caregiver assistance and eventual return to PLOF.       If plan is discharge home, recommend the following: A little help with walking and/or transfers;A little help with bathing/dressing/bathroom;Assistance with cooking/housework;Help with stairs or ramp for entrance;Assist for transportation   Can travel by private vehicle        Equipment Recommendations  none  Recommendations for Other Services       Functional Status Assessment Patient has had a recent decline in their functional status and demonstrates the ability to make significant improvements in function in a reasonable and predictable amount of time.     Precautions / Restrictions Precautions Precautions: Fall Recall of  Precautions/Restrictions: Intact Restrictions Weight Bearing Restrictions Per Provider Order: No      Mobility  Bed Mobility               General bed mobility comments: NT found in chair    Transfers Overall transfer level: Needs assistance Equipment used: Rolling walker (2 wheels) Transfers: Sit to/from Stand Sit to Stand: Contact guard assist                Ambulation/Gait Ambulation/Gait assistance: Contact guard assist Gait Distance (Feet): 60 Feet Assistive device: Rolling walker (2 wheels) Gait Pattern/deviations: Step-through pattern Gait velocity: decreased     General Gait Details: pt tolerated 2 bouts of 69ft with a rest break in between to assess SpO2 and HR.  Stairs            Wheelchair Mobility     Tilt Bed    Modified Rankin (Stroke Patients Only)       Balance Overall balance assessment: Needs assistance Sitting-balance support: No upper extremity supported, Feet supported Sitting balance-Leahy Scale: Normal       Standing balance-Leahy Scale: Good                               Pertinent Vitals/Pain Pain Assessment Pain Assessment: 0-10 Pain Score: 0-No pain    Home Living Family/patient expects to be discharged to:: Private residence Living Arrangements: Spouse/significant other Available Help at Discharge: Family;Available 24 hours/day Type of Home: House Home Access: Stairs to enter Entrance Stairs-Rails: Left Entrance Stairs-Number of Steps: 4   Home Layout: One level Home Equipment: Grab bars - tub/shower;Grab bars - toilet;Shower seat - built [employer]raiser (2 wheels);Rollator (4  wheels)      Prior Function Prior Level of Function : Independent/Modified Independent             Mobility Comments: household ambulator with 8L O2 at baseline and uses RW PRN ADLs Comments: stand by assist with bathing, otherwise mod ind     Extremity/Trunk Assessment   Upper Extremity Assessment Upper  Extremity Assessment: Defer to OT evaluation    Lower Extremity Assessment Lower Extremity Assessment: Generalized weakness       Communication   Communication Communication: No apparent difficulties    Cognition Arousal: Alert Behavior During Therapy: WFL for tasks assessed/performed   PT - Cognitive impairments: No apparent impairments                                 Cueing Cueing Techniques: Verbal cues     General Comments General comments (skin integrity, edema, etc.): on 8L O2 with rest and activity:    at rest 93%  activity midway through: 87%  At the end of activity: 73% --took approx 4-22min to rebound  At the end of session: 91% - care team notified   Exercises Other Exercises Other Exercises: pt edu of benefits of PT in acute care setting and rec at dc   Assessment/Plan    PT Assessment Patient needs continued PT services  PT Problem List Decreased strength;Decreased range of motion;Decreased activity tolerance;Decreased balance;Decreased mobility;Decreased knowledge of use of DME;Decreased coordination       PT Treatment Interventions Gait training;Stair training;Functional mobility training;Therapeutic activities;Therapeutic exercise;Balance training;Patient/family education    PT Goals (Current goals can be found in the Care Plan section)  Acute Rehab PT Goals Patient Stated Goal: get back to doing things normally and shopping PT Goal Formulation: With patient Time For Goal Achievement: 01/19/24 Potential to Achieve Goals: Good    Frequency Min 2X/week     Co-evaluation               AM-PAC PT 6 Clicks Mobility  Outcome Measure Help needed turning from your back to your side while in a flat bed without using bedrails?: A Little Help needed moving from lying on your back to sitting on the side of a flat bed without using bedrails?: A Little Help needed moving to and from a bed to a chair (including a wheelchair)?: A Little Help  needed standing up from a chair using your arms (e.g., wheelchair or bedside chair)?: A Little Help needed to walk in hospital room?: A Little Help needed climbing 3-5 steps with a railing? : A Lot 6 Click Score: 17    End of Session Equipment Utilized During Treatment: Gait belt Activity Tolerance: Patient tolerated treatment well Patient left: in chair;with call bell/phone within reach;with chair alarm set Nurse Communication: Mobility status PT Visit Diagnosis: Difficulty in walking, not elsewhere classified (R26.2);Muscle weakness (generalized) (M62.81)    Time: 8989-8967 PT Time Calculation (min) (ACUTE ONLY): 22 min   Charges:                Corean Newport, SPT 01/06/2024, 1:35 PM

## 2024-01-06 NOTE — Evaluation (Signed)
 Occupational Therapy Evaluation Patient Details Name: Susan Sosa MRN: [id number] DOB: [birth year] Today's Date: 01/06/2024   History of Present Illness   Pt is a 83 y.o. 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 MD assessment includes: acute on chronic systolic CHF, severe pulm HTN, possible acure cor pulmonale, nonischemic cardiomyopathy, acute on chronic resp failure with hypoxia.   Clinical Impressions Ms Pinkstaff was seen for OT evaluation this date. Prior to hospital admission, pt was MOD I household distances. Pt lives with spouse. Pt currently requires SUPERVISION + RW for toilet t/f and standing grooming tasks. SpO2 desat 84% on 8L [place] with activity. Educated on ECS. Pt would benefit from skilled OT to address noted impairments and functional limitations (see below for any additional details). Upon hospital discharge, recommend OT follow up.    If plan is discharge home, recommend the following:   A little help with walking and/or transfers;A little help with bathing/dressing/bathroom;Help with stairs or ramp for entrance     Functional Status Assessment   Patient has had a recent decline in their functional status and demonstrates the ability to make significant improvements in function in a reasonable and predictable amount of time.     Equipment Recommendations   None recommended by OT     Recommendations for Other Services         Precautions/Restrictions   Precautions Precautions: Fall Recall of Precautions/Restrictions: Intact Restrictions Weight Bearing Restrictions Per Provider Order: No     Mobility Bed Mobility               General bed  mobility comments: not tested    Transfers Overall transfer level: Needs assistance Equipment used: Rolling walker (2 wheels) Transfers: Sit to/from Stand Sit to Stand: Supervision                  Balance Overall balance assessment: Needs assistance Sitting-balance support: No upper extremity supported, Feet supported Sitting balance-Leahy Scale: Normal     Standing balance support: No upper extremity supported, During functional activity Standing balance-Leahy Scale: Good                             ADL either performed or assessed with clinical judgement   ADL Overall ADL's : Needs assistance/impaired                                       General ADL Comments: SUPERVISION + RW for toilet t/f and standing grooming tasks. SpO2 desat 84% on 8L [place] with activity      Pertinent Vitals/Pain Pain Assessment Pain Assessment: No/denies pain     Extremity/Trunk Assessment Upper Extremity Assessment Upper Extremity Assessment: Overall WFL for tasks assessed   Lower Extremity Assessment Lower Extremity Assessment: Generalized weakness       Communication Communication Communication: No apparent difficulties   Cognition Arousal: Alert Behavior During Therapy: WFL for tasks assessed/performed Cognition: No apparent impairments                               Following commands: Intact  Home Living Family/patient expects to be discharged to:: Private residence Living Arrangements: Spouse/significant other Available Help at Discharge: Family;Available 24 hours/day Type of Home: House Home Access: Stairs to enter [employer] of Steps: 4 Entrance Stairs-Rails: Left Home Layout: One level     Bathroom Shower/Tub: Producer, Television/film/video: Handicapped height     Home Equipment: Grab bars - tub/shower;Grab bars - toilet;Shower seat - built [employer]raiser (2 wheels);Rollator (4  wheels)          Prior Functioning/Environment Prior Level of Function : Independent/Modified Independent             Mobility Comments: household ambulator with 8L O2 at baseline and uses RW PRN ADLs Comments: stand by assist with bathing,    OT Problem List: Decreased strength;Decreased activity tolerance;Impaired balance (sitting and/or standing)   OT Treatment/Interventions: Self-care/ADL training;Therapeutic exercise;Energy conservation;DME and/or AE instruction;Therapeutic activities;Patient/family education;Balance training      OT Goals(Current goals can be found in the care plan section)   Acute Rehab OT Goals Patient Stated Goal: to go home OT Goal Formulation: With patient/family Time For Goal Achievement: 01/20/24 Potential to Achieve Goals: Good ADL Goals Pt Will Perform Grooming: Independently;standing Pt Will Perform Lower Body Dressing: sit to/from stand;with modified independence Pt Will Transfer to Toilet: with modified independence;ambulating;regular height toilet   OT Frequency:  Min 2X/week    Co-evaluation              AM-PAC OT 6 Clicks Daily Activity     Outcome Measure Help from another person eating meals?: None Help from another person taking care of personal grooming?: A Little Help from another person toileting, which includes using toliet, bedpan, or urinal?: A Little Help from another person bathing (including washing, rinsing, drying)?: A Little Help from another person to put on and taking off regular upper body clothing?: None Help from another person to put on and taking off regular lower body clothing?: A Little 6 Click Score: 20   End of Session    Activity Tolerance: Patient tolerated treatment well Patient left: in chair;with call bell/phone within reach;with chair alarm set;with family/visitor present  OT Visit Diagnosis: Other abnormalities of gait and mobility (R26.89);Muscle weakness (generalized) (M62.81)                 Time: 8869-8844 OT Time Calculation (min): 25 min Charges:  OT General Charges $OT Visit: 1 Visit OT Evaluation $OT Eval Low Complexity: 1 Low OT Treatments $Self Care/Home Management : 8-22 mins  Elston Slot, M.S. OTR/L  01/06/2024, 12:55 PM  ascom [phone]

## 2024-01-07 ENCOUNTER — Other Ambulatory Visit: Payer: Self-pay

## 2024-01-07 DIAGNOSIS — I5023 Acute on chronic systolic (congestive) heart failure: Secondary | ICD-10-CM | POA: Diagnosis not present

## 2024-01-07 LAB — RENAL FUNCTION PANEL
Albumin: 3.5 g/dL (ref 3.5–5.0)
Anion gap: 10 (ref 5–15)
BUN: 11 mg/dL (ref 8–23)
CO2: 27 mmol/L (ref 22–32)
Calcium: 8.9 mg/dL (ref 8.9–10.3)
Chloride: 94 mmol/L — ABNORMAL LOW (ref 98–111)
Creatinine, Ser: 0.93 mg/dL (ref 0.44–1.00)
GFR, Estimated: 60 mL/min — ABNORMAL LOW (ref >60)
Glucose, Bld: 96 mg/dL (ref 70–99)
Phosphorus: 3.7 mg/dL (ref 2.5–4.6)
Potassium: 4.2 mmol/L (ref 3.5–5.1)
Sodium: 132 mmol/L — ABNORMAL LOW (ref 135–145)

## 2024-01-07 MED ORDER — FUROSEMIDE 40 MG PO TABS
40.0000 mg | ORAL_TABLET | Freq: Every day | ORAL | 11 refills | Status: AC
  Filled 2024-01-07: qty 30, 30d supply, fill #0

## 2024-01-07 MED ORDER — ONDANSETRON 4 MG PO TBDP
4.0000 mg | ORAL_TABLET | Freq: Three times a day (TID) | ORAL | 0 refills | Status: AC | PRN
  Filled 2024-01-07: qty 18, 21d supply, fill #0
  Filled 2024-01-24 – 2024-01-25 (×3): qty 12, 4d supply, fill #1

## 2024-01-07 MED ORDER — VITAMIN D (ERGOCALCIFEROL) 1.25 MG (50000 UNIT) PO CAPS
50000.0000 [IU] | ORAL_CAPSULE | ORAL | 0 refills | Status: AC
  Filled 2024-01-07: qty 12, 84d supply, fill #0

## 2024-01-07 NOTE — Progress Notes (Signed)
 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 Recent addition of Imuran  due to progressive decline in PFT.  She at times gets respiratory infections but generally bounces back after course of abx and steroid taper.  She has also been on steroids with decadron  with partial improvement. 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  Her PFT have also declined over last five years but FEV1 this year has been spared >80% with severe decrement in DLCO as excpected with PH and ILD.  She has a previous history of smoking for appx 40years and does have emphysematous changes overlying her UIP pattern of fibrosis. She came in to hospital due to worsening hypoxemia.   Patient states she prefers using portable potty and she would like to do that privately if possible. She's on 8L/min [place].  I have ordered metaneb chest physiotherapy.  01/05/24- patient feels better this am.  She is able to speak in full sentences. She is diuresing well on lasix  40 BID and aldactone  25mg  daily.  I have reduced her midodrine  to 5 TID. We are starting Jascayd for IPF once she will be discharged.  01/06/24- patient is stable on 9L/min [place].  She has normal CBC with improved hb.  Renal function is stable. She uses metaneb chest physiotherapy.   Optimizing for dc home.  Viral testing is normal.   01/07/24- patient seen at bedside.  She's sitting up in chair with husband at bedside.  She feels improved and she's cleared for dc home. She should be dcd with Torsemide 20mg  daily and spiranolactone 12.5 daily.    PAST MEDICAL HISTORY   Past Medical History:  Diagnosis Date   Arthritis    Heart murmur    Hyperlipidemia    Hypertension    Hyponatremia    IPF (idiopathic pulmonary fibrosis) (HCC)      SURGICAL HISTORY   Past Surgical History:  Procedure Laterality Date   LEFT HEART CATH AND CORONARY ANGIOGRAPHY N/A 11/27/2021   Procedure: LEFT HEART CATH AND CORONARY ANGIOGRAPHY and possible pci and stent;  Surgeon: Florencio Cara BIRCH, MD;  Location: [facility] INV[facility]IVE CV [facility];  Service: Cardiovascular;  Laterality: N/A;   RIGHT HEART CATH Right 08/10/2023   Procedure: RIGHT HEART CATH;  Surgeon: Florencio Cara BIRCH, MD;  Location: [facility] INVASIVE CV [facility];  Service: Cardiovascular;  Laterality: Right;     FAMILY HISTORY   Family History  Problem Relation Age of Onset   Stroke Mother    Coronary artery disease Mother    Coronary artery disease Father    Stroke Father    Diabetes Father      SOCIAL HISTORY   Social History   Tobacco  Use   Smoking status: Former   Smokeless tobacco: Never   Tobacco comments:    Quit 30 years ago  Substance Use Topics   Alcohol use: Never   Drug use: Never     MEDICATIONS    Current Outpatient Rx   Order #: [id number] Class: Normal   Order #: [id number] Class: Normal   Order #: [id number] Class: Normal    Current Medication:  Current Facility-Administered Medications:    acetaminophen  (TYLENOL ) tablet 650 mg, 650 mg, Oral, Q6H PRN **OR** acetaminophen  (TYLENOL ) suppository 650 mg, 650 mg, Rectal, Q6H PRN, Cleatus Delayne GAILS, MD   albuterol  (PROVENTIL ) (2.5 MG/3ML) 0.083% nebulizer solution 2.5 mg, 2.5 mg, Nebulization, Q2H PRN, Cleatus Delayne GAILS, MD   aspirin  chewable tablet 81 mg, 81 mg,  Oral, QPM, Cleatus Delayne GAILS, MD, 81 mg at 01/06/24 1727   atorvastatin  (LIPITOR) tablet 40 mg, 40 mg, Oral, Daily, Cleatus Delayne GAILS, MD, 40 mg at 01/07/24 0827   azaTHIOprine  (IMURAN ) tablet 100 mg, 100 mg, Oral, Daily, Cleatus Delayne V, MD, 100 mg at 01/07/24 0827   cholecalciferol  (VITAMIN D3) 25 MCG (1000 UNIT) tablet 2,000 Units, 2,000 Units, Oral, Daily, Cleatus Delayne GAILS, MD, 2,000 Units at 01/07/24 9173   dexamethasone  (DECADRON ) tablet 1 mg, 1 mg, Oral, Daily, Osiris Charles, MD, 1 mg at 01/07/24 0825   diazepam  (VALIUM ) tablet 2 mg, 2 mg, Oral, QHS PRN, Mansy, Jan A, MD, 2 mg at 01/04/24 2253   enoxaparin  (LOVENOX ) injection 40 mg, 40 mg, Subcutaneous, QHS, Cleatus Delayne GAILS, MD, 40 mg at 01/06/24 2157   fluticasone  furoate-vilanterol (BREO ELLIPTA ) 100-25 MCG/ACT 1 puff, 1 puff, Inhalation, Daily, Cleatus Delayne GAILS, MD, 1 puff at 01/07/24 9171   furosemide  (LASIX ) injection 40 mg, 40 mg, Intravenous, Daily, Von Bellis, MD, 40 mg at 01/07/24 9170   guaiFENesin  (MUCINEX ) 12 hr tablet 600 mg, 600 mg, Oral, BID, Cleatus Delayne GAILS, MD, 600 mg at 01/07/24 9173   guaiFENesin -dextromethorphan  (ROBITUSSIN DM) 100-10 MG/5ML syrup 10 mL, 10 mL, Oral, Q6H PRN, Cleatus Delayne GAILS, MD   HYDROcodone -acetaminophen  (NORCO/VICODIN) 5-325 MG per tablet 1-2 tablet, 1-2 tablet, Oral, Q4H PRN, Cleatus Delayne GAILS, MD   ipratropium-albuterol  (DUONEB) 0.5-2.5 (3) MG/3ML nebulizer solution 3 mL, 3 mL, Nebulization, BID, Von Bellis, MD, 3 mL at 01/07/24 0750   metoprolol  tartrate (LOPRESSOR ) injection 5 mg, 5 mg, Intravenous, Q6H PRN, Von Bellis, MD   midodrine  (PROAMATINE ) tablet 5 mg, 5 mg, Oral, TID PRN, Emmarose Klinke, MD, 5 mg at 01/05/24 1403   nitroGLYCERIN  (NITROSTAT ) SL tablet 0.4 mg, 0.4 mg, Sublingual, Q5 min PRN, Cleatus Delayne GAILS, MD   ondansetron  (ZOFRAN ) tablet 4 mg, 4 mg, Oral, Q6H PRN **OR** ondansetron  (ZOFRAN ) injection 4 mg, 4 mg, Intravenous, Q6H PRN, Cleatus Delayne V, MD, 4 mg at 01/07/24 9162    Pirfenidone  TABS 534 mg, 534 mg, Oral, TID with meals, Von Bellis, MD, 534 mg at 01/07/24 9175   potassium chloride  SA (KLOR-CON  M) CR tablet 20 mEq, 20 mEq, Oral, Daily, Cleatus Delayne V, MD, 20 mEq at 01/07/24 9170   promethazine  (PHENERGAN ) 6.25 mg/NS 50 mL IVPB, 6.25 mg, Intravenous, Q6H PRN, Cleatus Delayne GAILS, MD   spironolactone  (ALDACTONE ) tablet 25 mg, 25 mg, Oral, Daily, Cleatus Delayne V, MD, 25 mg at 01/07/24 9173   Treprostinil  POWD 16 mcg, 16 mcg, Inhalation, QID, Von Bellis, MD, 16 mcg at 01/07/24 9170    ALLERGIES   Latex, Lidocaine, Atorvastatin , Lasix  [furosemide ], Losartan , Metronidazole, Torsemide, and Oxycodone   REVIEW OF SYSTEMS    Review of Systems:  Gen:  Denies  fever, sweats, chills weigh loss  HEENT: Denies blurred vision, double vision, ear pain, eye pain, hearing loss, nose bleeds, sore throat Cardiac:  No dizziness, chest pain or heaviness, chest tightness,edema Resp:   reports dyspnea chronically  Gi: Denies swallowing difficulty, stomach pain, nausea or vomiting, diarrhea, constipation, bowel incontinence Gu:  Denies bladder incontinence, burning urine Ext:   Denies Joint pain, stiffness or swelling Skin: Denies  skin rash, easy bruising or bleeding or hives Endoc:  Denies polyuria, polydipsia , polyphagia or weight change Psych:   Denies depression, insomnia or hallucinations   Other:  All other systems negative   VS: BP 99/62 (BP Location: Left Arm)   Pulse 95   Temp 98 F (36.7 C)   Resp 18   Ht 4' 11.5 (1.511 m)   Wt 56.1 kg   SpO2 98%   BMI 24.56 kg/m      PHYSICAL EXAM    GENERAL:NAD, no fevers, chills, no weakness no fatigue HEAD: Normocephalic, atraumatic.  EYES: Pupils equal, round, reactive to light. Extraocular muscles intact. No scleral icterus.  MOUTH: Moist mucosal membrane. Dentition intact. No abscess noted.  EAR, NOSE, THROAT: Clear without exudates. No external lesions.  NECK: Supple. No thyromegaly. No  nodules. No JVD.  PULMONARY: decreased breath sounds with mild rhonchi worse at bases bilaterally.  CARDIOVASCULAR: S1 and S2. Regular rate and rhythm. No murmurs, rubs, or gallops. No edema. Pedal pulses 2+ bilaterally.  GASTROINTESTINAL: Soft, nontender, nondistended. No masses. Positive bowel sounds. No hepatosplenomegaly.  MUSCULOSKELETAL: No swelling, clubbing, or edema. Range of motion full in all extremities.  NEUROLOGIC: Cranial nerves II through XII are intact. No gross focal neurological deficits. Sensation intact. Reflexes intact.  SKIN: No ulceration, lesions, rashes, or cyanosis. Skin warm and dry. Turgor intact.  PSYCHIATRIC: Mood, affect within normal limits. The patient is awake, alert and oriented x 3. Insight, judgment intact.       IMAGING   Narrative & Impression  CLINICAL DATA:  Acute on chronic hypoxia, shortness of breath   EXAM: CT ANGIOGRAPHY CHEST WITH CONTRAST   TECHNIQUE: Multidetector CT imaging of the chest was performed using the standard protocol during bolus administration of intravenous contrast. Multiplanar CT image reconstructions and MIPs were obtained to evaluate the vascular anatomy.   RADIATION DOSE REDUCTION: This exam was performed according to the departmental dose-optimization program which includes automated exposure control, adjustment of the mA and/or kV according to patient size and/or use of iterative reconstruction technique.   CONTRAST:  75mL OMNIPAQUE  IOHEXOL  350 MG/ML SOLN   COMPARISON:  01/03/2024, 11/11/2023   FINDINGS: Cardiovascular: This is a technically adequate evaluation of the pulmonary vasculature. There are no filling defects or pulmonary emboli. Marked dilation of the pulmonary artery is again noted consistent with pulmonary arterial hypertension.   The heart is enlarged, with prominent right ventricular dilatation again noted. No evidence of thoracic aortic aneurysm or dissection. Atherosclerosis of the aorta  and coronary vasculature.   Mediastinum/Nodes: No enlarged mediastinal, hilar, or axillary lymph nodes. Thyroid gland, trachea, and esophagus demonstrate no significant findings.   Lungs/Pleura: There is background emphysema and fibrosis, with superimposed interlobular septal thickening and scattered bibasilar ground-glass airspace disease compatible with developing pulmonary edema. No effusion or pneumothorax. The central airways are patent.   Upper Abdomen: No acute abnormality.   Musculoskeletal: No acute or destructive bony abnormalities. Reconstructed images demonstrate no additional findings.   Review of  the MIP images confirms the above findings.   IMPRESSION: 1. No evidence of pulmonary embolus. 2. Findings consistent with congestive heart failure and mild pulmonary edema, superimposed upon background emphysema and fibrosis. 3. Stable cardiomegaly, with dilated pulmonary arteries consistent with pulmonary arterial hypertension. 4. Aortic Atherosclerosis (ICD10-I70.0) and Emphysema (ICD10-J43.9).     Electronically Signed   By: Ozell Daring M.D.   On: 01/03/2024 18:37    ASSESSMENT/PLAN   Acute on chronic hypoxemic respiratory failure       Currently negative for viral LRTI due to negative RVP and SARS/FLU/RSV panel      - inflammatory workup -ddimer, CRP      - VBG today      - 0 Result Notes    Component Ref Range & Units (hover) 1 d ago  Pro Brain Natriuretic Peptide 8,113.0 High          Idiopathic pulmonary fibrosis with acute exacerbation    - advanced and progressed over past 5 years    - continue decadron  2mg  bid    Centrilobular emphysema -no signs of acute exacerbation of COPD  - viral workup and inflammatory biomarkers are negative   - duoneb prn is sufficient at this time     Pulmonary hypertension associated with pulmonary fibrosis    - will dc tyvasso DPI at home with poor tolerance will switch to [place] going forward  -pre and  post capillary per RHC done this year   -PT/OT ordered   Bibasilar atelectasis   - recruitement maneuvers via chest physiotherapy utilizing metaneb with duoneb     - PT /OT  Physical deconditioning   Continue PT/OT , continue chest PT  BODE score >8 with poor long term prognosis   Goals of care     - patient is currently DNR      Thank you for allowing me to participate in the care of this patient.   Patient/Family are satisfied with care plan and all questions have been answered.    Provider disclosure: Patient with at least one acute or chronic illness or injury that poses a threat to life or bodily function and is being managed actively during this encounter.  All of the below services have been performed independently by signing provider:  review of prior documentation from internal and or external health records.  Review of previous and current lab results.  Interview and comprehensive assessment during patient visit today. Review of current and previous chest radiographs/CT scans. Discussion of management and test interpretation with health care team and patient/family.   This document was prepared using Dragon voice recognition software and may include unintentional dictation errors.     Daysy Santini, M.D.  Division of Pulmonary & Critical Care Medicine

## 2024-01-07 NOTE — Discharge Summary (Signed)
 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  Hospital Course:  Susan Sosa is a 83 y.o. 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 She presents with acutely worsening shortness of breath starting on the morning of arrival with O2 sats in the 70s at home on her base flow rate of 8 L. In the ED, she was tachycardic and tachypneic with O2 sats 73% on 6 L  improving to 100% on 8 L.Venous pH was unremarkable, troponin 38-37 and proBNP 8113.  CMP and CBC are at baseline. EKG shows sinus tachycardia at 115 with nonspecific T wave abnormalities CTA chest negative for PE but showing CHF and pulmonary edema. Patient treated with Lasix  and DuoNeb Admission requested    Assessment and Plan:   # Acute on chronic systolic CHF (congestive heart failure)  Severe pulmonary hypertension, possible acute cor pulmonale Nonischemic cardiomyopathy Acute on chronic respiratory failure with hypoxia Patient presents with acute onset shortness of breath with increased O2 requirement in spite of outpatient intensification of diuretic regimen Clinically fluid overloaded on exam, proBNP 8113 and CTA chest showing pulmonary edema S/p Lasix  40 mg IV BID, decreased to daily Continue GDMT with spironolactone , losartan  Holding salt tablet for salt restriction History of HFrEF with improved EF (35% 2023-> 55% 09/2023)  Daily weights and intake and output monitoring Pulmonary consult: Recommended MetaNeb with DuoNeb due to bibasilar atelectasis. RVP panel negative Currently patient is back to her baseline, cleared by pulm to discharge and follow-up as an outpatient.  Continued Lasix  40 mg p.o. daily and Aldactone  25 mg p.o. daily.  Continue fluid restriction and follow-up with pulm in 1 week.   # Pulmonary hypertension, severe (HCC) Idiopathic pulmonary fibrosis-on O2 at 8 L at baseline Continue treprostinil  Continued azathioprine  and dexamethasone  S/p Mucinex  600 mg p.o. BID, Breo Ellipta  inhaler and DuoNeb every 6 hourly.  Resumed Symbicort and albuterol  home inhalers on discharge.     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  Continue fluid restriction 1.5 L/day and  follow with PCP to repeat BMP in 1 week.  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     Body mass index is 24.56 kg/m.  Nutrition Interventions:  Patient was seen by physical therapy, who recommended Home health, which was arranged. On the day of the discharge the patient's vitals were stable, and no other acute medical condition were reported by patient. the patient was felt safe to be discharge at Home with Home health.  Consultants: Pulmonary Procedures: None  Discharge Exam: General: Appear in no distress, Oral Mucosa Clear, moist. Cardiovascular: S1 and S2 Present, no Murmur, Respiratory: normal respiratory effort, Bilateral Air entry present and mild b/l crackles, no wheezes Abdomen: Bowel Sound present, Soft and no tenderness. Extremities: no Pedal edema, no calf tenderness Neurology: alert and oriented to time, place, and person affect appropriate.  Filed Weights   01/05/24 0526 01/06/24 0524 01/07/24 0459  Weight: 57.2 kg 56.4 kg 56.1 kg   Vitals:   01/07/24 0752 01/07/24 0929  BP:  99/62  Pulse:  95  Resp:  18  Temp:  98 F (36.7 C)  SpO2: 98% 98%    DISCHARGE MEDICATION: Allergies as of 01/07/2024       Reactions   Latex Itching, Rash   Lidocaine Swelling   Facial swelling from a dentist Facial swelling from a dentist Facial swelling from a dentist   Atorvastatin     Severe hip pain   Lasix  [furosemide ] Nausea And Vomiting   Losartan  Swelling   Metronidazole Other (See Comments)   Sore joints Sore joints   Torsemide Nausea And Vomiting   Oxycodone  Nausea Only        Medication List     PAUSE taking these medications    losartan  50 MG tablet Wait to take this until your doctor or other care provider tells you to start again. Commonly known as: COZAAR  Take 0.5 tablets (25 mg total) by mouth daily as needed (systolic BP  greater than 140).       STOP taking these medications    acetylcysteine  20 % nebulizer solution Commonly known as: MUCOMYST    cholecalciferol  25 MCG (1000 UNIT) tablet Commonly known as: VITAMIN D3   torsemide 20 MG tablet Commonly known as: DEMADEX       TAKE these medications    albuterol  (2.5 MG/3ML) 0.083% nebulizer solution Commonly known as: PROVENTIL  Take 2.5 mg by nebulization in the morning and at bedtime.   aspirin  81 MG chewable tablet Chew 81 mg by mouth every evening.   atorvastatin  40 MG tablet Commonly known as: LIPITOR Take 40 mg by mouth daily.   azathioprine  100 MG tablet Commonly known as: IMURAN  Take 100 mg by mouth daily.   budesonide -formoterol 160-4.5 MCG/ACT inhaler Commonly known as: SYMBICORT Inhale 2 puffs into the lungs 2 (two) times daily.   dexamethasone  1 MG tablet Commonly known as: DECADRON  Take 1 mg by mouth 2 (two) times daily.   esomeprazole 40 MG capsule Commonly known as: NEXIUM Take 40 mg by mouth at bedtime.   FT Tussin DM Adult 20-200 MG/20ML Liqd Generic drug: Dextromethorphan -guaiFENesin  Take 10 mLs by mouth every 6 (six) hours as needed.   furosemide  40 MG tablet Commonly known as: Lasix  Take 1 tablet (40 mg total) by mouth daily.   guaiFENesin  600 MG  12 hr tablet Commonly known as: MUCINEX  Take 600 mg by mouth daily as needed for cough.  Take 1 tablet (600 mg total) by mouth once daily as needed for Cough or Congestion   midodrine  10 MG tablet Commonly known as: PROAMATINE  Take 1 tablet (10 mg total) by mouth 3 (three) times daily as needed (Systolic BP less than <100).   nitroGLYCERIN  0.4 MG SL tablet Commonly known as: NITROSTAT  Place 1 tablet (0.4 mg total) under the tongue every 5 (five) minutes as needed for chest pain.   ondansetron  4 MG disintegrating tablet Commonly known as: ZOFRAN -ODT Take 1 tablet (4 mg total) by mouth every 8 (eight) hours as needed for nausea or vomiting. What changed:  reasons to take this   OXYGEN Inhale 6 L into the lungs continuous.   Pirfenidone  267 MG Tabs Take 534 mg by mouth with breakfast, with lunch, and with evening meal.   potassium chloride  SA 20 MEQ tablet Commonly known as: KLOR-CON  M Take 1 tablet (20 mEq total) by mouth daily.   prochlorperazine  10 MG tablet Commonly known as: COMPAZINE  Take 10 mg by mouth every 6 (six) hours as needed.   spironolactone  25 MG tablet Commonly known as: ALDACTONE  Take 1 tablet (25 mg total) by mouth daily.   Tyvaso  DPI Maintenance Kit 16 MCG Powd Generic drug: Treprostinil  Inhale 16 mcg into the lungs in the morning, at noon, in the evening, and at bedtime.   Vitamin D  (Ergocalciferol ) 1.25 MG (50000 UNIT) Caps capsule Commonly known as: DRISDOL  Take 1 capsule (50,000 Units total) by mouth every 7 (seven) days.       Allergies[1] Discharge Instructions     Call MD for:  difficulty breathing, headache or visual disturbances   Complete by: As directed    Call MD for:  extreme fatigue   Complete by: As directed    Call MD for:  persistant dizziness or light-headedness   Complete by: As directed    Call MD for:  persistant nausea and vomiting   Complete by: As directed    Call MD for:  severe uncontrolled pain   Complete by: As directed    Call MD for:  temperature >100.4   Complete by: As directed    Discharge instructions   Complete by: As directed    Follow-up with PCP in 1 week Follow-up with pulmonary in 1 week Repeat BMP in 1 week Repeat vitamin D  level in between 3 to 6 months   Increase activity slowly   Complete by: As directed        The results of significant diagnostics from this hospitalization (including imaging, microbiology, ancillary and laboratory) are listed below for reference.    Significant Diagnostic Studies: DG Chest Port 1 View Result Date: 01/06/2024 EXAM: 1 VIEW(S) XRAY OF THE CHEST 01/06/2024 01:01:00 PM COMPARISON: 01/03/2024 CLINICAL HISTORY:  Atelectasis. FINDINGS: LUNGS AND PLEURA: Low lung volume. Stable heterogeneous bibasilar opacities. No pleural effusion. No pneumothorax. HEART AND MEDIASTINUM: No acute abnormality of the cardiac and mediastinal silhouettes. BONES AND SOFT TISSUES: Bilateral humeral head anchor pins noted. No acute osseous abnormality. IMPRESSION: 1. Stable heterogeneous bibasilar opacities, likely atelectasis, in the setting of low lung volumes. Electronically signed by: Oneil Devonshire MD 01/06/2024 09:08 PM EST RP Workstation: [id number]   CT Angio Chest PE W and/or Wo Contrast Result Date: 01/03/2024 CLINICAL DATA:  Acute on chronic hypoxia, shortness of breath EXAM: CT ANGIOGRAPHY CHEST WITH CONTRAST TECHNIQUE: Multidetector CT imaging of the chest was performed  using the standard protocol during bolus administration of intravenous contrast. Multiplanar CT image reconstructions and MIPs were obtained to evaluate the vascular anatomy. RADIATION DOSE REDUCTION: This exam was performed according to the departmental dose-optimization program which includes automated exposure control, adjustment of the mA and/or kV according to patient size and/or use of iterative reconstruction technique. CONTRAST:  75mL OMNIPAQUE  IOHEXOL  350 MG/ML SOLN COMPARISON:  01/03/2024, 11/11/2023 FINDINGS: Cardiovascular: This is a technically adequate evaluation of the pulmonary vasculature. There are no filling defects or pulmonary emboli. Marked dilation of the pulmonary artery is again noted consistent with pulmonary arterial hypertension. The heart is enlarged, with prominent right ventricular dilatation again noted. No evidence of thoracic aortic aneurysm or dissection. Atherosclerosis of the aorta and coronary vasculature. Mediastinum/Nodes: No enlarged mediastinal, hilar, or axillary lymph nodes. Thyroid gland, trachea, and esophagus demonstrate no significant findings. Lungs/Pleura: There is background emphysema and fibrosis, with superimposed  interlobular septal thickening and scattered bibasilar ground-glass airspace disease compatible with developing pulmonary edema. No effusion or pneumothorax. The central airways are patent. Upper Abdomen: No acute abnormality. Musculoskeletal: No acute or destructive bony abnormalities. Reconstructed images demonstrate no additional findings. Review of the MIP images confirms the above findings. IMPRESSION: 1. No evidence of pulmonary embolus. 2. Findings consistent with congestive heart failure and mild pulmonary edema, superimposed upon background emphysema and fibrosis. 3. Stable cardiomegaly, with dilated pulmonary arteries consistent with pulmonary arterial hypertension. 4. Aortic Atherosclerosis (ICD10-I70.0) and Emphysema (ICD10-J43.9). Electronically Signed   By: Ozell Daring M.D.   On: 01/03/2024 18:37   DG Chest Portable 1 View Result Date: 01/03/2024 CLINICAL DATA:  Hypoxia EXAM: PORTABLE CHEST 1 VIEW COMPARISON:  Chest radiograph dated 11/18/2023 FINDINGS: Low lung volumes. Increased diffuse interstitial and bibasilar patchy opacities. Blunting of the right costophrenic angle. No pneumothorax. Similar enlarged cardiomediastinal silhouette. No acute osseous abnormality. Bilateral humeral bone anchors. IMPRESSION: 1. Increased diffuse interstitial and bibasilar patchy opacities, which may represent pulmonary edema or atypical infection. 2. Blunting of the right costophrenic angle, which may represent a small pleural effusion. Electronically Signed   By: Limin  Xu M.D.   On: 01/03/2024 15:30    Microbiology: Recent Results (from the past 240 hours)  Resp panel by RT-PCR (RSV, Flu A&B, Covid) Anterior Nasal Swab     Status: None   Collection Time: 01/03/24  1:55 PM   Specimen: Anterior Nasal Swab  Result Value Ref Range Status   SARS Coronavirus 2 by RT PCR NEGATIVE NEGATIVE Final    Comment: (NOTE) SARS-CoV-2 target nucleic acids are NOT DETECTED.  The SARS-CoV-2 RNA is generally  detectable in upper respiratory specimens during the acute phase of infection. The lowest concentration of SARS-CoV-2 viral copies this assay can detect is 138 copies/mL. A negative result does not preclude SARS-Cov-2 infection and should not be used as the sole basis for treatment or other patient management decisions. A negative result may occur with  improper specimen collection/handling, submission of specimen other than nasopharyngeal swab, presence of viral mutation(s) within the areas targeted by this assay, and inadequate number of viral copies(<138 copies/mL). A negative result must be combined with clinical observations, patient history, and epidemiological information. The expected result is Negative.  Fact Sheet for Patients:  bloggercourse.com  Fact Sheet for Healthcare Providers:  seriousbroker.it  This test is no t yet approved or cleared by the [place]  FDA and  has been authorized for detection and/or diagnosis of SARS-CoV-2 by FDA under an Emergency Use Authorization (EUA). This EUA will remain  in effect (meaning this test can be used) for the duration of the COVID-19 declaration under Section 564(b)(1) of the Act, 21 U.S.C.section 360bbb-3(b)(1), unless the authorization is terminated  or revoked sooner.       Influenza A by PCR NEGATIVE NEGATIVE Final   Influenza B by PCR NEGATIVE NEGATIVE Final    Comment: (NOTE) The Xpert Xpress SARS-CoV-2/FLU/RSV plus assay is intended as an aid in the diagnosis of influenza from Nasopharyngeal swab specimens and should not be used as a sole basis for treatment. Nasal washings and aspirates are unacceptable for Xpert Xpress SARS-CoV-2/FLU/RSV testing.  Fact Sheet for Patients: bloggercourse.com  Fact Sheet for Healthcare Providers: seriousbroker.it  This test is not yet approved or cleared by the [place]  FDA  and has been authorized for detection and/or diagnosis of SARS-CoV-2 by FDA under an Emergency Use Authorization (EUA). This EUA will remain in effect (meaning this test can be used) for the duration of the COVID-19 declaration under Section 564(b)(1) of the Act, 21 U.S.C. section 360bbb-3(b)(1), unless the authorization is terminated or revoked.     Resp Syncytial Virus by PCR NEGATIVE NEGATIVE Final    Comment: (NOTE) Fact Sheet for Patients: bloggercourse.com  Fact Sheet for Healthcare Providers: seriousbroker.it  This test is not yet approved or cleared by the [place]  FDA and has been authorized for detection and/or diagnosis of SARS-CoV-2 by FDA under an Emergency Use Authorization (EUA). This EUA will remain in effect (meaning this test can be used) for the duration of the COVID-19 declaration under Section 564(b)(1) of the Act, 21 U.S.C. section 360bbb-3(b)(1), unless the authorization is terminated or revoked.  Performed at [facility], [address] [place], [place] [address]   Respiratory (~20 pathogens) panel by PCR     Status: None   Collection Time: 01/04/24  9:31 AM   Specimen: Nasopharyngeal Swab; Respiratory  Result Value Ref Range Status   Adenovirus NOT DETECTED NOT DETECTED Final   Coronavirus 229E NOT DETECTED NOT DETECTED Final    Comment: (NOTE) The Coronavirus on the Respiratory Panel, DOES NOT test for the novel  Coronavirus (2019 nCoV)    Coronavirus HKU1 NOT DETECTED NOT DETECTED Final   Coronavirus NL63 NOT DETECTED NOT DETECTED Final   Coronavirus OC43 NOT DETECTED NOT DETECTED Final   Metapneumovirus NOT DETECTED NOT DETECTED Final   Rhinovirus / Enterovirus NOT DETECTED NOT DETECTED Final   Influenza A NOT DETECTED NOT DETECTED Final   Influenza B NOT DETECTED NOT DETECTED Final   Parainfluenza Virus 1 NOT DETECTED NOT DETECTED Final   Parainfluenza Virus 2 NOT DETECTED NOT  DETECTED Final   Parainfluenza Virus 3 NOT DETECTED NOT DETECTED Final   Parainfluenza Virus 4 NOT DETECTED NOT DETECTED Final   Respiratory Syncytial Virus NOT DETECTED NOT DETECTED Final   Bordetella pertussis NOT DETECTED NOT DETECTED Final   Bordetella Parapertussis NOT DETECTED NOT DETECTED Final   Chlamydophila pneumoniae NOT DETECTED NOT DETECTED Final   Mycoplasma pneumoniae NOT DETECTED NOT DETECTED Final    Comment: Performed at [facility], [address], [place], [place] [address]     Labs: CBC: Recent Labs  Lab 01/03/24 1355 01/04/24 0422 01/05/24 0503  WBC 9.2 8.7 8.2  NEUTROABS 8.4*  --   --   HGB 11.6* 11.2* 12.4  HCT 34.9* 34.2* 38.5  MCV 93.1 92.9 92.3  PLT 340 331 376   Basic Metabolic Panel: Recent Labs  Lab 01/03/24 1355 01/04/24 0422 01/05/24 0503 01/07/24  0430  NA 134* 134* 135 132*  K 4.9 4.7 4.2 4.2  CL 95* 94* 92* 94*  CO2 25 28 30 27   GLUCOSE 171* 122* 93 96  BUN 14 13 9 11   CREATININE 0.91 1.02* 0.97 0.93  CALCIUM  8.8* 8.8* 9.1 8.9  MG  --  1.8 1.8  --   PHOS  --  4.8* 4.8* 3.7   Liver Function Tests: Recent Labs  Lab 01/03/24 1355 01/07/24 0430  AST 20  --   ALT 11  --   ALKPHOS 112  --   BILITOT 0.5  --   PROT 6.6  --   ALBUMIN 3.8 3.5   No results for input(s): LIPASE, AMYLASE in the last 168 hours. No results for input(s): AMMONIA in the last 168 hours. Cardiac Enzymes: No results for input(s): CKTOTAL, CKMB, CKMBINDEX, TROPONINI in the last 168 hours. BNP (last 3 results) Recent Labs    10/14/23 1646 11/09/23 2138  BNP 317.7* 1,343.7*   CBG: No results for input(s): GLUCAP in the last 168 hours.  Time spent: 35 minutes  Signed:  Elvan Sor  [facility] 01/07/2024 11:21 AM     [1]  Allergies Allergen Reactions   Latex Itching and Rash   Lidocaine Swelling    Facial swelling from a dentist Facial swelling from a dentist Facial swelling from a dentist    Atorvastatin       Severe hip pain   Lasix  [Furosemide ] Nausea And Vomiting   Losartan  Swelling   Metronidazole Other (See Comments)    Sore joints Sore joints    Torsemide Nausea And Vomiting   Oxycodone  Nausea Only

## 2024-01-24 ENCOUNTER — Other Ambulatory Visit: Payer: Self-pay

## 2024-01-25 ENCOUNTER — Other Ambulatory Visit: Payer: Self-pay
